# Patient Record
Sex: Female | Born: 1935 | Race: Asian | Hispanic: No | Smoking: Current every day smoker
Health system: Southern US, Community
[De-identification: ages and names within clinical notes are randomized; demographics above are authoritative.]

## PROBLEM LIST (undated history)

## (undated) DIAGNOSIS — R221 Localized swelling, mass and lump, neck: Secondary | ICD-10-CM

## (undated) DIAGNOSIS — D638 Anemia in other chronic diseases classified elsewhere: Secondary | ICD-10-CM

## (undated) DIAGNOSIS — R32 Unspecified urinary incontinence: Secondary | ICD-10-CM

## (undated) DIAGNOSIS — M810 Age-related osteoporosis without current pathological fracture: Secondary | ICD-10-CM

## (undated) DIAGNOSIS — K219 Gastro-esophageal reflux disease without esophagitis: Secondary | ICD-10-CM

## (undated) DIAGNOSIS — M4800 Spinal stenosis, site unspecified: Secondary | ICD-10-CM

## (undated) DIAGNOSIS — Z720 Tobacco use: Secondary | ICD-10-CM

## (undated) DIAGNOSIS — I10 Essential (primary) hypertension: Secondary | ICD-10-CM

## (undated) HISTORY — DX: Localized swelling, mass and lump, neck: R22.1

## (undated) HISTORY — DX: Essential (primary) hypertension: I10

## (undated) HISTORY — PX: APPENDECTOMY: SHX54

---

## 2000-08-26 ENCOUNTER — Other Ambulatory Visit: Admission: RE | Admit: 2000-08-26 | Discharge: 2000-08-26 | Payer: Self-pay | Admitting: *Deleted

## 2001-06-24 ENCOUNTER — Emergency Department (HOSPITAL_COMMUNITY): Admission: EM | Admit: 2001-06-24 | Discharge: 2001-06-24 | Payer: Self-pay | Admitting: Emergency Medicine

## 2001-07-01 ENCOUNTER — Encounter: Admission: RE | Admit: 2001-07-01 | Discharge: 2001-07-01 | Payer: Self-pay | Admitting: Internal Medicine

## 2003-09-28 ENCOUNTER — Encounter (INDEPENDENT_AMBULATORY_CARE_PROVIDER_SITE_OTHER): Payer: Self-pay | Admitting: *Deleted

## 2003-09-28 ENCOUNTER — Ambulatory Visit (HOSPITAL_COMMUNITY): Admission: RE | Admit: 2003-09-28 | Discharge: 2003-09-28 | Payer: Self-pay | Admitting: Gastroenterology

## 2007-03-19 ENCOUNTER — Encounter: Admission: RE | Admit: 2007-03-19 | Discharge: 2007-03-19 | Payer: Self-pay | Admitting: Family Medicine

## 2009-02-10 ENCOUNTER — Encounter: Admission: RE | Admit: 2009-02-10 | Discharge: 2009-02-10 | Payer: Self-pay | Admitting: *Deleted

## 2010-11-11 ENCOUNTER — Encounter: Payer: Self-pay | Admitting: *Deleted

## 2011-03-08 NOTE — Op Note (Signed)
Barbara Dennis, Barbara Dennis                         ACCOUNT NO.:  [id number]   MEDICAL RECORD NO.:  [id number]                   PATIENT TYPE:  AMB   LOCATION:  ENDO                                 FACILITY:  [facility]   PHYSICIAN:  Anselmo Rod, M.D.               DATE OF BIRTH:  04-Dec-1933   DATE OF PROCEDURE:  09/28/2003  DATE OF DISCHARGE:                                 OPERATIVE REPORT   PROCEDURE:  Colonoscopy with biopsies.   ENDOSCOPIST:  Anselmo Rod, M.D.   INSTRUMENT USED:  Olympus video colonoscope.   INDICATIONS FOR PROCEDURE:  A 77 year old Asian female with a history of  right lower quadrant pain, normal CT of the pelvis, undergoing screening  colonoscopy to rule out colonic polyps, masses, etc.   PREPROCEDURE PREPARATION:  Informed consent was procured from the patient.  The patient was fasted for eight hours prior to the procedure and prepped  with a bottle of magnesium citrate and a gallon of GoLYTELY the night prior  to the procedure.   PREPROCEDURE PHYSICAL EXAMINATION:  VITAL SIGNS:  Stable.  NECK:  Supple.  CHEST:  Clear to auscultation.  S1 and S2 regular.  ABDOMEN:  Soft with normal bowel sounds.   DESCRIPTION OF PROCEDURE:  The patient was placed in the left lateral  decubitus position and sedated with 50 mg of Demerol and 5 mg of Versed  intravenously in slow incremental doses.  Once the patient was adequately  sedated and maintained on low flow oxygen and continuous cardiac monitoring,  the Olympus video colonoscope was advanced from the rectum to the cecum.  There was a large amount of residual stool in the cecum and right colon.  Multiple washings were done.  The patient's position was changed from the  left lateral supine in the right lateral position with gentle application of  abdominal pressure to reach the cecal base.  A small polyp was biopsied at  40 cm.  Small lesions could have been missed especially in the cecum and  right colon.  No large  masses or polyps were seen.  There was no evidence of  diverticulosis.  Retroflexion in the rectum revealed no abnormalities.   IMPRESSION:  1. Essentially unrevealing colonoscopy except for small polyps biopsied at     40 cm.  2. Large amount of residual stool in the right colon.  Small lesions could     have been missed.   RECOMMENDATIONS:  Await pathology results.  Continue a high fiber diet with  liberal fluid intake.  Outpatient follow-up in the next four weeks or  earlier if needed.                                               Anselmo Rod, M.D.  JNM/MEDQ  D:  09/29/2003  T:  09/30/2003  Job:  [id number]   cc:   Gabriel Earing, M.D.  [address]  [place]  [place] [address]  Fax: [phone]

## 2012-07-18 ENCOUNTER — Ambulatory Visit (INDEPENDENT_AMBULATORY_CARE_PROVIDER_SITE_OTHER): Payer: Medicare Other | Admitting: Family Medicine

## 2012-07-18 VITALS — BP 124/74 | HR 80 | Temp 98.2°F | Resp 17 | Ht 58.5 in | Wt 105.0 lb

## 2012-07-18 DIAGNOSIS — N760 Acute vaginitis: Secondary | ICD-10-CM

## 2012-07-18 DIAGNOSIS — R509 Fever, unspecified: Secondary | ICD-10-CM

## 2012-07-18 DIAGNOSIS — R300 Dysuria: Secondary | ICD-10-CM

## 2012-07-18 DIAGNOSIS — N390 Urinary tract infection, site not specified: Secondary | ICD-10-CM

## 2012-07-18 DIAGNOSIS — D72829 Elevated white blood cell count, unspecified: Secondary | ICD-10-CM

## 2012-07-18 LAB — POCT UA - MICROSCOPIC ONLY
Bacteria, U Microscopic: UNDETERMINED
Casts, Ur, LPF, POC: UNDETERMINED
Crystals, Ur, HPF, POC: UNDETERMINED
Epithelial cells, urine per micros: UNDETERMINED
Mucus, UA: UNDETERMINED
Yeast, UA: UNDETERMINED

## 2012-07-18 LAB — POCT URINALYSIS DIPSTICK
Bilirubin, UA: NEGATIVE
Glucose, UA: NEGATIVE
Ketones, UA: NEGATIVE
Nitrite, UA: NEGATIVE
Protein, UA: NEGATIVE
Spec Grav, UA: 1.015
Urobilinogen, UA: 0.2
pH, UA: 7

## 2012-07-18 LAB — POCT CBC
Granulocyte percent: 71.2 % (ref 37–80)
HCT, POC: 40.4 % (ref 37.7–47.9)
Hemoglobin: 13 g/dL (ref 12.2–16.2)
Lymph, poc: 3 (ref 0.6–3.4)
MCH, POC: 31 pg (ref 27–31.2)
MCHC: 32.2 g/dL (ref 31.8–35.4)
MCV: 96.2 fL (ref 80–97)
MID (cbc): 0.7 (ref 0–0.9)
MPV: 7.6 fL (ref 0–99.8)
POC Granulocyte: 9.3 — AB (ref 2–6.9)
POC LYMPH PERCENT: 23.3 % (ref 10–50)
POC MID %: 5.5 %M (ref 0–12)
Platelet Count, POC: 310 10*3/uL (ref 142–424)
RBC: 4.2 M/uL (ref 4.04–5.48)
RDW, POC: 13.7 %
WBC: 13 10*3/uL — AB (ref 4.6–10.2)

## 2012-07-18 MED ORDER — CEFTRIAXONE SODIUM 1 G IJ SOLR: 1.0000 g | INTRAMUSCULAR | Status: DC

## 2012-07-18 MED ORDER — CEFTRIAXONE SODIUM 1 G IJ SOLR
1.0000 g | Freq: Once | INTRAMUSCULAR | Status: AC
  Administered 2012-07-18: 1 g via INTRAMUSCULAR

## 2012-07-18 MED ORDER — NITROFURANTOIN MONOHYD MACRO 100 MG PO CAPS: 100.0000 mg | ORAL_CAPSULE | Freq: Two times a day (BID) | ORAL | Status: DC

## 2012-07-18 NOTE — Progress Notes (Signed)
[facility]:  Office Visit  Chief Complaint:  Chief Complaint  Patient presents with  . Urinary Frequency  . Dysuria    burn during urination    HPI: Barbara Dennis is a 78 y.o. female who complains of dysuria and fever. Hard to determine how long. She is [place] and their is a language barrier.  + Fever, chills, back pain; denies abd or pelvic pain.   History reviewed. No pertinent past medical history. Past Surgical History  Procedure Date  . Appendectomy    History   Social History  . Marital Status: Widowed    Spouse Name: N/A    Number of Children: N/A  . Years of Education: N/A   Social History Main Topics  . Smoking status: Current Every Day Smoker -- 0.7 packs/day for 60 years    Types: Cigarettes  . Smokeless tobacco: None  . Alcohol Use: No  . Drug Use: No  . Sexually Active: None   Other Topics Concern  . None   Social History Narrative  . None   No family history on file. No Known Allergies Prior to Admission medications   Medication Sig Start Date End Date Taking? Authorizing Provider  Multiple Vitamins-Minerals (MULTIVITAMIN WITH MINERALS) tablet Take 1 tablet by mouth daily.   Yes Historical Provider, MD     ROS: The patient denies fevers, chills, night sweats, unintentional weight loss, chest pain, palpitations, wheezing, dyspnea on exertion, nausea, vomiting, abdominal pain, dysuria, hematuria, melena, numbness, weakness, or tingling.   All other systems have been reviewed and were otherwise negative with the exception of those mentioned in the HPI and as above.    PHYSICAL EXAM: Filed Vitals:   07/18/12 1101  BP: 124/74  Pulse: 80  Temp: 98.2 F (36.8 C)  Resp: 17   Filed Vitals:   07/18/12 1101  Height: 4' 10.5" (1.486 m)  Weight: 105 lb (47.628 kg)   Body mass index is 21.57 kg/(m^2).  General: Alert, no acute distress HEENT:  Normocephalic, atraumatic, oropharynx patent.  Cardiovascular:  Regular rate and  rhythm, no rubs murmurs or gallops.  No Carotid bruits, radial pulse intact. No pedal edema.  Respiratory: Clear to auscultation bilaterally.  No wheezes, rales, or rhonchi.  No cyanosis, no use of accessory musculature GI: No organomegaly, abdomen is soft and non-tender, positive bowel sounds.  No masses. Skin: No rashes. Neurologic: Facial musculature symmetric. Psychiatric: Patient is appropriate throughout our interaction. Lymphatic: No cervical lymphadenopathy Musculoskeletal: Gait intact.   LABS: Results for orders placed in visit on 07/18/12  POCT UA - MICROSCOPIC ONLY      Component Value Range   WBC, Ur, HPF, POC TNTC     RBC, urine, microscopic TNTC     Bacteria, U Microscopic unable to view     Mucus, UA unable to view     Epithelial cells, urine per micros unable to view     Crystals, Ur, HPF, POC unable to view     Casts, Ur, LPF, POC unable to view     Yeast, UA unable to view    POCT URINALYSIS DIPSTICK      Component Value Range   Color, UA yellow     Clarity, UA turbid     Glucose, UA negative     Bilirubin, UA negative     Ketones, UA negative     Spec Grav, UA 1.015     Blood, UA TNTC     pH, UA  7.0     Protein, UA negative     Urobilinogen, UA 0.2     Nitrite, UA negative     Leukocytes, UA large (3+)    POCT CBC      Component Value Range   WBC 13.0 (*) 4.6 - 10.2 K/uL   Lymph, poc 3.0  0.6 - 3.4   POC LYMPH PERCENT 23.3  10 - 50 %L   MID (cbc) 0.7  0 - 0.9   POC MID % 5.5  0 - 12 %M   POC Granulocyte 9.3 (*) 2 - 6.9   Granulocyte percent 71.2  37 - 80 %G   RBC 4.20  4.04 - 5.48 M/uL   Hemoglobin 13.0  12.2 - 16.2 g/dL   HCT, POC 16.1  09.6 - 47.9 %   MCV 96.2  80 - 97 fL   MCH, POC 31.0  27 - 31.2 pg   MCHC 32.2  31.8 - 35.4 g/dL   RDW, POC 04.5     Platelet Count, POC 310  142 - 424 K/uL   MPV 7.6  0 - 99.8 fL     EKG/XRAY:   Primary read interpreted by Dr[facility] Conley Rolls at [facility].   ASSESSMENT/PLAN: Encounter Diagnoses  Name Primary?  .  Dysuria Yes  . Vaginitis   . Fever   . UTI (lower urinary tract infection)   . Leukocytosis    Rx Rocephin 1 gram x 1 Rx Macrobid x 7 days   Call to make appt with Dr. Audria Nine to recheck urine and also establish primary care for Oct 7th    Hamilton [place], DO 07/18/2012 12:34 PM

## 2012-07-20 LAB — URINE CULTURE: Colony Count: >=100000

## 2012-08-21 ENCOUNTER — Ambulatory Visit (INDEPENDENT_AMBULATORY_CARE_PROVIDER_SITE_OTHER): Payer: Medicare Other | Admitting: Family Medicine

## 2012-08-21 VITALS — BP 140/84 | HR 84 | Temp 98.7°F | Resp 16 | Ht 60.0 in | Wt 106.2 lb

## 2012-08-21 DIAGNOSIS — R300 Dysuria: Secondary | ICD-10-CM

## 2012-08-21 DIAGNOSIS — Z124 Encounter for screening for malignant neoplasm of cervix: Secondary | ICD-10-CM

## 2012-08-21 DIAGNOSIS — N952 Postmenopausal atrophic vaginitis: Secondary | ICD-10-CM

## 2012-08-21 DIAGNOSIS — R1084 Generalized abdominal pain: Secondary | ICD-10-CM

## 2012-08-21 DIAGNOSIS — R9389 Abnormal findings on diagnostic imaging of other specified body structures: Secondary | ICD-10-CM

## 2012-08-21 DIAGNOSIS — Z01419 Encounter for gynecological examination (general) (routine) without abnormal findings: Secondary | ICD-10-CM

## 2012-08-21 DIAGNOSIS — Z780 Asymptomatic menopausal state: Secondary | ICD-10-CM

## 2012-08-21 LAB — POCT URINALYSIS DIPSTICK
Bilirubin, UA: NEGATIVE
Glucose, UA: NEGATIVE
Spec Grav, UA: 1.025
pH, UA: 5.5

## 2012-08-21 LAB — COMPREHENSIVE METABOLIC PANEL
ALT: 12 U/L (ref 0–35)
AST: 17 U/L (ref 0–37)
Alkaline Phosphatase: 101 U/L (ref 39–117)
Creat: 0.84 mg/dL (ref 0.50–1.10)
Total Bilirubin: 0.7 mg/dL (ref 0.3–1.2)

## 2012-08-21 LAB — IFOBT (OCCULT BLOOD): IFOBT: NEGATIVE

## 2012-08-21 LAB — TSH: TSH: 1.288 u[IU]/mL (ref 0.350–4.500)

## 2012-08-21 MED ORDER — ESTROGENS, CONJUGATED 0.625 MG/GM VA CREA: TOPICAL_CREAM | VAGINAL | Status: DC

## 2012-08-21 NOTE — Patient Instructions (Signed)
Please try to get your medical records from your previous medical practice/provider so that I can review your medical history. If you have any records of vaccines or immunizations, please bring that to your next visit.  Atrophic Vaginitis Atrophic vaginitis is a problem of low levels of estrogen in women. This problem can happen at any age. It is most common in women who have gone through menopause ("the change").  HOW WILL I KNOW IF I HAVE THIS PROBLEM? You may have:  Trouble with peeing (urinating), such as:  Going to the bathroom often.  A hard time holding your pee until you reach a bathroom.  Leaking pee.  Having pain when you pee.  Itching or a burning feeling.  Vaginal bleeding and spotting.  Pain during sex.  Dryness of the vagina.  A yellow, bad-smelling fluid (discharge) coming from the vagina. HOW WILL MY DOCTOR CHECK FOR THIS PROBLEM?  During your exam, your doctor will likely find the problem.  If there is a vaginal fluid, it may be checked for infection. HOW WILL THIS PROBLEM BE TREATED? Keep the vulvar skin as clean as possible. Moisturizers and lubricants can help with some of the symptoms. Estrogen replacement can help. There are 2 ways to take estrogen:  Systemic estrogen gets estrogen to your whole body. It takes many weeks or months before the symptoms get better.  You take an estrogen pill.  You use a skin patch. This is a patch that you put on your skin.  If you still have your uterus, your doctor may ask you to take a hormone. Talk to your doctor about the right medicine for you.  Estrogen cream.  This puts estrogen only at the part of your body where you apply it. The cream is put into the vagina or put on the vulvar skin. For some women, estrogen cream works faster than pills or the patch. CAN ALL WOMEN WITH THIS PROBLEM USE ESTROGEN? No. Women with certain types of cancer, liver problems, or problems with blood clots should not take estrogen.  Your doctor can help you decide the best treatment for your symptoms. Document Released: 03/25/2008 Document Revised: 12/30/2011 Document Reviewed: 03/25/2008 [facility] Patient Information 2013 [place], [place].

## 2012-08-25 ENCOUNTER — Encounter: Payer: Self-pay | Admitting: Family Medicine

## 2012-08-25 NOTE — Progress Notes (Signed)
Subjective:    Patient ID: Barbara Dennis, female    DOB: [birth year], 78 y.o.   MRN: [id number]  HPI  This 78 y.o. [place] female comes in today accompanied by a friend who is her interpreter;  the pt was seen at [facility] and treated for UTI. Pt had no problem with prescribed medication  but dysuria persists. She c/o dryness and has used an OTC topical agent with no improvement.  No recent PAP/ pelvic to report. She is a vague historian so details are lacking. She has vague  pelvic discomfort; she is postmenopausal. (A pelvic ultrasound was performed in April 2010 and   showed minimal abnormality). In general, she feels good and has a good appetite and  energy level is appropriate for 65 y.o. Woman.   Efforts at getting a history re: Immunizations or other preventative care yield little information.    Review of Systems  Constitutional: Negative for fever, chills, activity change, appetite change, fatigue and unexpected weight change.  HENT: Negative.   Eyes: Negative.   Respiratory: Negative for cough, chest tightness and shortness of breath.   Cardiovascular: Negative.   Gastrointestinal: Negative for nausea, vomiting, abdominal pain, diarrhea, constipation, blood in stool and abdominal distention.  Genitourinary: Positive for dysuria, frequency and pelvic pain. Negative for hematuria, flank pain, decreased urine volume, vaginal bleeding, enuresis, difficulty urinating and vaginal pain.  Musculoskeletal: Negative.   Neurological: Negative.   Hematological: Negative.   Psychiatric/Behavioral: Negative.        Objective:   Physical Exam  Nursing note and vitals reviewed. Constitutional: She is oriented to person, place, and time. She appears well-developed and well-nourished. No distress.  HENT:  Head: Normocephalic and atraumatic.  Right Ear: External ear normal.  Left Ear: External ear normal.  Nose: Nose normal.  Mouth/Throat: Oropharynx is clear and moist.  Eyes:  Conjunctivae normal and EOM are normal. No scleral icterus.  Neck: Normal range of motion. Neck supple. No thyromegaly present.  Cardiovascular: Normal rate, regular rhythm and normal heart sounds.  Exam reveals no gallop and no friction rub.   No murmur heard. Pulmonary/Chest: Effort normal and breath sounds normal. No respiratory distress.  Abdominal: Soft. Normal appearance. She exhibits no distension, no abdominal bruit, no pulsatile midline mass and no mass. Bowel sounds are decreased. There is no hepatosplenomegaly. There is tenderness in the suprapubic area. There is no rigidity, no guarding and no CVA tenderness.  Genitourinary: Rectum normal and uterus normal. Rectal exam shows no external hemorrhoid, no internal hemorrhoid, no mass, no tenderness and anal tone normal. Guaiac negative stool. No breast swelling, tenderness, discharge or bleeding. There is no rash, tenderness or lesion on the right labia. There is no rash, tenderness or lesion on the left labia. Cervix exhibits friability. Cervix exhibits no motion tenderness and no discharge. Right adnexum displays no mass, no tenderness and no fullness. Left adnexum displays no mass, no tenderness and no fullness. There is erythema around the vagina. No tenderness or bleeding around the vagina. No signs of injury around the vagina. No vaginal discharge found.       Vaginal mucosa dry and atrophic.  Musculoskeletal: Normal range of motion. She exhibits no edema and no tenderness.  Lymphadenopathy:    She has no cervical adenopathy.       Right: No inguinal adenopathy present.       Left: No inguinal adenopathy present.  Neurological: She is alert and oriented to person, place, and time. She has normal reflexes.  No cranial nerve deficit. She exhibits normal muscle tone. Coordination normal.  Skin: Skin is warm and dry. No erythema. No pallor.  Psychiatric: She has a normal mood and affect. Her behavior is normal.   Poct UA: negative except  for 1+ Leukocytes      Assessment & Plan:   1. Dysuria    2. Abnormal pelvic ultrasound in April 2010 Pap IG w/ reflex to HPV when ASC-U, IFOBT POC (occult bld, rslt in office)  3. Encounter for cervical Pap smear with pelvic exam  Pap IG w/ reflex to HPV when ASC-U  4. Abdominal discomfort, generalized  Comprehensive metabolic panel, TSH, T4, Free  5. Menopause  Print info given to the pt  6. Atrophic vaginitis  conjugated estrogens (PREMARIN) vaginal cream

## 2012-08-26 LAB — PAP IG W/ RFLX HPV ASCU

## 2012-08-26 NOTE — Progress Notes (Signed)
Quick Note:  Please call pt and advise that the following labs are normal...   All labs and PAP are normal.  She has a follow-up appt and I can review these results at that visit; there is a significant language barrier.  ______

## 2012-09-22 ENCOUNTER — Ambulatory Visit (INDEPENDENT_AMBULATORY_CARE_PROVIDER_SITE_OTHER): Payer: Medicare Other | Admitting: Family Medicine

## 2012-09-22 ENCOUNTER — Encounter: Payer: Self-pay | Admitting: Family Medicine

## 2012-09-22 VITALS — BP 162/64 | HR 88 | Temp 99.1°F | Resp 16 | Ht <= 58 in | Wt 105.4 lb

## 2012-09-22 DIAGNOSIS — G4700 Insomnia, unspecified: Secondary | ICD-10-CM

## 2012-09-22 DIAGNOSIS — Z720 Tobacco use: Secondary | ICD-10-CM

## 2012-09-22 DIAGNOSIS — F17200 Nicotine dependence, unspecified, uncomplicated: Secondary | ICD-10-CM

## 2012-09-22 DIAGNOSIS — R030 Elevated blood-pressure reading, without diagnosis of hypertension: Secondary | ICD-10-CM

## 2012-09-22 NOTE — Patient Instructions (Addendum)
For sleep: try MELATONIN which you can get without a prescription to help you sleep better.  You will also try to stop smoking before your next birthday in March. I will see you back in March 2014.   Smoking Cessation Quitting smoking is important to your health and has many advantages. However, it is not always easy to quit since nicotine is a very addictive drug. Often times, people try 3 times or more before being able to quit. This document explains the best ways for you to prepare to quit smoking. Quitting takes hard work and a lot of effort, but you can do it. ADVANTAGES OF QUITTING SMOKING  You will live longer, feel better, and live better.  Your body will feel the impact of quitting smoking almost immediately.  Within 20 minutes, blood pressure decreases. Your pulse returns to its normal level.  After 8 hours, carbon monoxide levels in the blood return to normal. Your oxygen level increases.  After 24 hours, the chance of having a heart attack starts to decrease. Your breath, hair, and body stop smelling like smoke.  After 48 hours, damaged nerve endings begin to recover. Your sense of taste and smell improve.  After 72 hours, the body is virtually free of nicotine. Your bronchial tubes relax and breathing becomes easier.  After 2 to 12 weeks, lungs can hold more air. Exercise becomes easier and circulation improves.  The risk of having a heart attack, stroke, cancer, or lung disease is greatly reduced.  After 1 year, the risk of coronary heart disease is cut in half.  After 5 years, the risk of stroke falls to the same as a nonsmoker.  After 10 years, the risk of lung cancer is cut in half and the risk of other cancers decreases significantly.  After 15 years, the risk of coronary heart disease drops, usually to the level of a nonsmoker.  If you are pregnant, quitting smoking will improve your chances of having a healthy baby.  The people you live with, especially any  children, will be healthier.  You will have extra money to spend on things other than cigarettes. QUESTIONS TO THINK ABOUT BEFORE ATTEMPTING TO QUIT You may want to talk about your answers with your caregiver.  Why do you want to quit?  If you tried to quit in the past, what helped and what did not?  What will be the most difficult situations for you after you quit? How will you plan to handle them?  Who can help you through the tough times? Your family? Friends? A caregiver?  What pleasures do you get from smoking? What ways can you still get pleasure if you quit? Here are some questions to ask your caregiver:  How can you help me to be successful at quitting?  What medicine do you think would be best for me and how should I take it?  What should I do if I need more help?  What is smoking withdrawal like? How can I get information on withdrawal? GET READY  Set a quit date.  Change your environment by getting rid of all cigarettes, ashtrays, matches, and lighters in your home, car, or work. Do not let people smoke in your home.  Review your past attempts to quit. Think about what worked and what did not. GET SUPPORT AND ENCOURAGEMENT You have a better chance of being successful if you have help. You can get support in many ways.  Tell your family, friends, and co-workers that  you are going to quit and need their support. Ask them not to smoke around you.  Get individual, group, or telephone counseling and support. Programs are available at [employer] and health centers. Call your local health department for information about programs in your area.  Spiritual beliefs and practices may help some smokers quit.  Download a "quit meter" on your computer to keep track of quit statistics, such as how long you have gone without smoking, cigarettes not smoked, and money saved.  Get a self-help book about quitting smoking and staying off of tobacco. LEARN NEW SKILLS AND  BEHAVIORS  Distract yourself from urges to smoke. Talk to someone, go for a walk, or occupy your time with a task.  Change your normal routine. Take a different route to work. Drink tea instead of coffee. Eat breakfast in a different place.  Reduce your stress. Take a hot bath, exercise, or read a book.  Plan something enjoyable to do every day. Reward yourself for not smoking.  Explore interactive web-based programs that specialize in helping you quit. GET MEDICINE AND USE IT CORRECTLY Medicines can help you stop smoking and decrease the urge to smoke. Combining medicine with the above behavioral methods and support can greatly increase your chances of successfully quitting smoking.  Nicotine replacement therapy helps deliver nicotine to your body without the negative effects and risks of smoking. Nicotine replacement therapy includes nicotine gum, lozenges, inhalers, nasal sprays, and skin patches. Some may be available over-the-counter and others require a prescription.  Antidepressant medicine helps people abstain from smoking, but how this works is unknown. This medicine is available by prescription.  Nicotinic receptor partial agonist medicine simulates the effect of nicotine in your brain. This medicine is available by prescription. Ask your caregiver for advice about which medicines to use and how to use them based on your health history. Your caregiver will tell you what side effects to look out for if you choose to be on a medicine or therapy. Carefully read the information on the package. Do not use any other product containing nicotine while using a nicotine replacement product.  RELAPSE OR DIFFICULT SITUATIONS Most relapses occur within the first 3 months after quitting. Do not be discouraged if you start smoking again. Remember, most people try several times before finally quitting. You may have symptoms of withdrawal because your body is used to nicotine. You may crave cigarettes,  be irritable, feel very hungry, cough often, get headaches, or have difficulty concentrating. The withdrawal symptoms are only temporary. They are strongest when you first quit, but they will go away within 10 14 days. To reduce the chances of relapse, try to:  Avoid drinking alcohol. Drinking lowers your chances of successfully quitting.  Reduce the amount of caffeine you consume. Once you quit smoking, the amount of caffeine in your body increases and can give you symptoms, such as a rapid heartbeat, sweating, and anxiety.  Avoid smokers because they can make you want to smoke.  Do not let weight gain distract you. Many smokers will gain weight when they quit, usually less than 10 pounds. Eat a healthy diet and stay active. You can always lose the weight gained after you quit.  Find ways to improve your mood other than smoking. FOR MORE INFORMATION  www.smokefree.gov  Document Released: 10/01/2001 Document Revised: 04/07/2012 Document Reviewed: 01/16/2012 [facility] Patient Information 2013 [place], [place].

## 2012-09-22 NOTE — Progress Notes (Signed)
S:  This pt returns for follow-up. Vaginal dryness and dysuria have resolved w/ use of vaginal cream.Today she c/o insomnia; she tosses and turns all night. She took medication in the past but none taken recently. She denies worry about anything. The medication she tried in the past caused very vivid dreams so she discontinued that medication. Pt lives alone but knows some of her neighbors (but not well). It is a quiet apartment building. Sleeps on a sofa and awakens "sore". She watches TV to fall asleep. She does not nap during the day. She states that growing up, she slept on the floor.  Re: elevated BP- she is trying to quit smoking; down to 5-7 cigarettes a day. She denies CP or tightness, palpitations, SOB or cough, HA, dizziness, weakness or syncope.  ROS: As per HPI.  O:  Filed Vitals:   09/22/12 1331 09/22/12 1401  BP: 164/76 162/64  Pulse: 88   Temp: 99.1 F (37.3 C)   TempSrc: Oral   Resp: 16   Height: 4\' 10"  (1.473 m)   Weight: 105 lb 6.4 oz (47.809 kg)   SpO2: 97%     GEN: In NAD; WN/WD.  Moderate language barrier. HEENT: [place]/AT; EOMI w/ clear conj/scl. Otherwise normal except fair dentition. COR: RRR. No edema. LUNGS: Normal resp rate and effort. NEURO: A&O x 3; CNs intact. Nonfocal.  A/P:  1. Insomnia    Pt advised to try OTC Melatonin 1 tab or capsule 30 minutes before bedtime; she also must turn off TV 30 -60 minutes before desired sleep time.  2. Tobacco user    Cessation encouraged. Plan to be a nonsmoker by next visit.  3. Elevated blood pressure reading without diagnosis of hypertension   BP has been normal at past visits; monitor.

## 2012-09-23 DIAGNOSIS — Z720 Tobacco use: Secondary | ICD-10-CM | POA: Insufficient documentation

## 2012-12-25 ENCOUNTER — Ambulatory Visit: Payer: Medicare Other | Admitting: Family Medicine

## 2013-02-16 ENCOUNTER — Ambulatory Visit (INDEPENDENT_AMBULATORY_CARE_PROVIDER_SITE_OTHER): Payer: Medicare Other | Admitting: Family Medicine

## 2013-02-16 ENCOUNTER — Encounter: Payer: Self-pay | Admitting: Family Medicine

## 2013-02-16 VITALS — BP 146/68 | HR 81 | Temp 98.1°F | Resp 16 | Ht <= 58 in | Wt 106.6 lb

## 2013-02-16 DIAGNOSIS — M25559 Pain in unspecified hip: Secondary | ICD-10-CM

## 2013-02-16 DIAGNOSIS — M79641 Pain in right hand: Secondary | ICD-10-CM

## 2013-02-16 DIAGNOSIS — M79609 Pain in unspecified limb: Secondary | ICD-10-CM

## 2013-02-16 DIAGNOSIS — M1990 Unspecified osteoarthritis, unspecified site: Secondary | ICD-10-CM | POA: Insufficient documentation

## 2013-02-16 LAB — CBC WITH DIFFERENTIAL/PLATELET
Eosinophils Relative: 4 % (ref 0–5)
HCT: 35.6 % — ABNORMAL LOW (ref 36.0–46.0)
Hemoglobin: 12.2 g/dL (ref 12.0–15.0)
Lymphocytes Relative: 31 % (ref 12–46)
Lymphs Abs: 2.6 10*3/uL (ref 0.7–4.0)
MCV: 90.8 fL (ref 78.0–100.0)
Monocytes Relative: 7 % (ref 3–12)
Platelets: 307 10*3/uL (ref 150–400)
RBC: 3.92 MIL/uL (ref 3.87–5.11)
WBC: 8.6 10*3/uL (ref 4.0–10.5)

## 2013-02-16 MED ORDER — DICLOFENAC SODIUM 75 MG PO TBEC: DELAYED_RELEASE_TABLET | ORAL | Status: DC

## 2013-02-16 NOTE — Patient Instructions (Signed)
For joint pain- do not take Aleve or aspirin while you are taking the medication that I prescribed.  Diclofenac 75 mg tablets should be taken twice a day with food or snack.

## 2013-02-16 NOTE — Progress Notes (Signed)
S:  This 79 y.o. [place] woman c/o bilateral posterior hip pain and hand pain (R>L). Pt lives alone and does stays busy doing house work; she also walks almost daily. Pt is R-hand dominant and has swelling in the digits of that hand. She takes Aleve and ASA 325 mg which help reduce discomfort. She has not been febrile or diaphoretic. She has no rashes, CP or tightness, SOB or DOE, abd pain or change in stools, UTI symptoms, weakness or significant gait disturbance. Pt does have stiffness in mornings and with prolonged sitting.  ROS: AS per HPI. Otherwise noncontributory.  O: Filed Vitals:   02/16/13 1551  BP: 146/68  Pulse: 81  Temp: 98.1 F (36.7 C)  Resp: 16   GEN: In NAD; WN,WD. HENT: [place]/AT; EOMI w/ clear conj/ sclerae. Oroph clear and moist; dentition is fair. COR: RRR. No edema. LUNGS: Normal resp rate and effort.  BACK: Spine is straight; no tender points over spinous processes or major muscles groups. Tender bilateral SI joints. Forward flexion to 90 degrees. MS: R hand- tender, erythematous swollen DIP and PIP joints especially 2nd, 3rd and 4th digits. Grip is good. No muscle atrophy noted. SKIN: W&D; no rashes or diffuse erythema or pallor. NEURO: A&O x 3; CNs intact. Nonfocal. Gait is antalgic.  A/P: Pain in joint, pelvic region and thigh, unspecified laterality -  RX: Diclofenac 75 mg 1 tablet twice a day w/ meals. Do not take Aleve or ASA 325 mg tablets while taking this prescription medication. Plan: Sedimentation rate, CBC with Differential, ANA, Hepatitis C antibody, Rheumatoid factor  Bilateral hand pain - Plan: Sedimentation rate, CBC with Differential, ANA, Hepatitis C antibody, Rheumatoid factor

## 2013-02-17 LAB — RHEUMATOID FACTOR: Rhuematoid fact SerPl-aCnc: <10 IU/mL (ref <=14)

## 2013-02-18 NOTE — Progress Notes (Signed)
Quick Note:  Please contact pt and advise that the following labs are abnormal...  All labs look normal; the marker for inflammation is above normal but not by much. It is consistent with joint pain and arthritis. Follow-up as scheduled next month.  Copy to pt. ______

## 2013-03-26 ENCOUNTER — Ambulatory Visit (INDEPENDENT_AMBULATORY_CARE_PROVIDER_SITE_OTHER): Payer: Medicare Other | Admitting: Family Medicine

## 2013-03-26 ENCOUNTER — Encounter: Payer: Self-pay | Admitting: Family Medicine

## 2013-03-26 VITALS — BP 177/80 | HR 85 | Temp 97.9°F | Resp 18 | Ht <= 58 in | Wt 106.0 lb

## 2013-03-26 DIAGNOSIS — F411 Generalized anxiety disorder: Secondary | ICD-10-CM

## 2013-03-26 DIAGNOSIS — F17200 Nicotine dependence, unspecified, uncomplicated: Secondary | ICD-10-CM

## 2013-03-26 DIAGNOSIS — R030 Elevated blood-pressure reading, without diagnosis of hypertension: Secondary | ICD-10-CM

## 2013-03-26 MED ORDER — BUPROPION HCL 75 MG PO TABS: 75.0000 mg | ORAL_TABLET | Freq: Two times a day (BID) | ORAL | Status: DC

## 2013-03-26 NOTE — Progress Notes (Signed)
S:  This 79 y.o. [place] female is here to discuss tobacco use and elevated BP. She does not have a diagnosis HTN but often has elevated BP when she feels anxious. Her concerns are for her future and what will happen to her when she can no longer care for self. She has friends but no family in the area; her family is in [place]. She does not want to return to [place] in her later years but would like to go to a "place for old people". When she gets anxious about the future is when she is most likely to smoke. She would really like to quit and is willing to try some oral medication; the patches are not effective. At this time, she has no significant health consequences as a result of tobacco use.  Patient Active Problem List   Diagnosis Date Noted  . DJD (degenerative joint disease) 02/16/2013  . Tobacco user 09/23/2012  . Menopause 08/21/2012  . Postmenopausal atrophic vaginitis 08/21/2012    PMHx, Soc HX and Fam Hx reviewed.  ROS: Negative for abnormal weight loss, fatigue, diaphoresis, mouth lesions, sore throat, difficulty swallowing, CP or tightness, palpitations, SOB or COE, cough, hemoptysis, GI problems, skin changes in extremities, muscles cramping or claudication, HA, dizziness, numbness or syncope. She has mild sleep disturbance but no agitation, dysphoric mood or behavior changes.  O:  Filed Vitals:   03/26/13 1128  BP: 177/80                                    Weight is stable.  Pulse: 85  Temp: 97.9 F (36.6 C)  Resp: 18   GEN: In NAD; WN,WD. HENT: [place]/AT; EOMI w/ clear conj/sclerae. EACs/nose normal. Dentition is fair. NECK: No LAN or TMG. COR: RRR. No m/g/r. LUNGS: CTA; No wheezes or rhonchi. Normal resp rate and effort. SKIN: W&D; no erythema, rashes or pallor. MS: MAEs; no c/c/e. No deformities. NEURO: A&O x 3; CNs intact. Nonfocal. PSYCH: Mildly anxious but otherwise pleasant demeanor. Attentive and conversant. Speech pattern and thought content are normal. Judgement is  sound.  A/P: Elevated blood-pressure reading without diagnosis of hypertension- continue to monitor; encouraged tobacco cessation.  Tobacco use disorder- trial of Bupropion at low dose to curb desire to smoke; pt has made decision to quit which demonstrates motivation. Take medication 1 tablet every AM until follow-up; tobacco quit date is April 20, 2013. Pt understands and agrees.  Anxiety state, unspecified- RX: Bupropion  Meds ordered this encounter  Medications  . buPROPion (WELLBUTRIN) 75 MG tablet    Sig: Take 1 tablet (75 mg total) by mouth 2 (two) times daily.    Dispense:  60 tablet    Refill:  5   Greater 50% of visit spent counseling pt and discussing treatment plan.

## 2013-05-07 ENCOUNTER — Encounter: Payer: Self-pay | Admitting: Family Medicine

## 2013-05-07 ENCOUNTER — Ambulatory Visit (INDEPENDENT_AMBULATORY_CARE_PROVIDER_SITE_OTHER): Payer: Medicare Other | Admitting: Family Medicine

## 2013-05-07 VITALS — BP 185/83 | HR 88 | Temp 97.8°F | Resp 16 | Ht 58.25 in | Wt 106.8 lb

## 2013-05-07 DIAGNOSIS — R52 Pain, unspecified: Secondary | ICD-10-CM

## 2013-05-07 DIAGNOSIS — R030 Elevated blood-pressure reading, without diagnosis of hypertension: Secondary | ICD-10-CM

## 2013-05-07 DIAGNOSIS — Z720 Tobacco use: Secondary | ICD-10-CM

## 2013-05-07 DIAGNOSIS — F17200 Nicotine dependence, unspecified, uncomplicated: Secondary | ICD-10-CM

## 2013-05-07 MED ORDER — DICLOFENAC SODIUM 75 MG PO TBEC: DELAYED_RELEASE_TABLET | ORAL | Status: DC

## 2013-05-07 NOTE — Patient Instructions (Addendum)
Your blood pressure is above normal.   I want you to stop drinking Mt. Dew sodas. Drink water and juice. You can have 1-2 cups of coffee in the mornings.  You need to stop smoking !!!  Take the medication -Bupropion twice a day (2 times a day). This is the medication to help you stop smoking and to help with your anxiety and mental distress.  I want you to come back in 1 month; bring all your medications with youo.

## 2013-05-07 NOTE — Progress Notes (Signed)
S: This 79 y.o. [place] female returns for follow-up re: elevated blood pressure. She has occasional mild frontal HA but no other symptoms. She drinks coffee every Am as well as Mt. Dew throughout the day. She has not restricted her salt intake. Pt has significant anxiety [id number] and aging; she wants to quit smoking and is taking Bupropion. Dose increase was explained at previous visit but pt continues to take only 1 tablet daily. She continues to smoke. Appetite is fair.  Pt c/o arthralgias, primarily in hands, low back and hips. This discomfort is worse in the morning upon arising but subsides as day progresses. Diclofenac 75 mg tablets were prescribed at previous visit but pt not taking this medication daily. She has not had fever/chills, fatigue, chest tightness, palpitations, SOB or DOE, rashes or bruising, HA, dizziness, numbness or weakness. Pt denies recent travel outside the state. She has had no contact w/ ill persons.  Patient Active Problem List   Diagnosis Date Noted  . DJD (degenerative joint disease) 02/16/2013  . Tobacco user 09/23/2012  . Menopause 08/21/2012  . Postmenopausal atrophic vaginitis 08/21/2012    PMHX, Soc Hx and Fam Hx reviewed.   ROS: As per HPI. Otherwise noncontributory.   O:  Filed Vitals:   05/07/13 1132  BP: 185/83                      BP recheck @ 12:15 pm= 160/80  Pulse: 88  Temp: 97.8 F (36.6 C)  Resp: 16   GEN: In NAD; WN,WD. HENT: [place]/AT; EOMI w/ clear conj/sclerae. Oroph moist; fair dentition. NECK: No LAN or TMG. COR: RRR. No edema. LUNGS: Normal resp rate and effort. SKIN: W&D; no rashes, ecchymoses or pallor. MS: MAEs; mild deg changes in hands/digits and wrists. Grip is good. Minimal muscle tenderness w/ palpation; no muscle atrophy. BACK: Paravertebral muscle tenderness. NEURO: A&O x 3; CNs intact. Gait is mildly antalgic but pt ambulates w/o assistive device. PSYCH: Mildly anxious; pt's comprehension is hampered by language  barrier.  A/P: Elevated blood pressure reading without diagnosis of hypertension- Advised dietary modifications (reduce caffeine and salt intake).  Tobacco user- Increase Bupropion dose to twice a day. This is reviewed w/ the pt multiple times to insure that she understands medication administration.  Body aches- DJD (pt advised to takes DIclofenac twice a day with meals as prescribed). She voices understanding.  Meds ordered this encounter  Medications  . diclofenac (VOLTAREN) 75 MG EC tablet    Sig: Take 1 tablet twice a day with food or snack.    Dispense:  60 tablet    Refill:  2

## 2013-06-17 ENCOUNTER — Encounter: Payer: Self-pay | Admitting: Family Medicine

## 2013-06-17 ENCOUNTER — Ambulatory Visit (INDEPENDENT_AMBULATORY_CARE_PROVIDER_SITE_OTHER): Payer: Medicare Other | Admitting: Family Medicine

## 2013-06-17 VITALS — BP 180/66 | HR 84 | Temp 97.7°F | Resp 16 | Ht <= 58 in | Wt 107.0 lb

## 2013-06-17 DIAGNOSIS — I10 Essential (primary) hypertension: Secondary | ICD-10-CM | POA: Insufficient documentation

## 2013-06-17 MED ORDER — LISINOPRIL 5 MG PO TABS: 5.0000 mg | ORAL_TABLET | Freq: Every day | ORAL | Status: DC

## 2013-06-17 NOTE — Patient Instructions (Addendum)
I have prescribed a medication to lower your blood pressure. It is called Lisinopril 5 mg. Take 1 tablet every morning or about the same time of day, every day.  I will see you again in 2 months; you should be off the cigarettes when you come back to see me.

## 2013-06-17 NOTE — Progress Notes (Signed)
S:  This 79 y.o. Asian female returns for BP recheck. She is attempting smoking cessation, currently less than 5 cigs daily. She c/o occasional HA but no dizziness, CP or tightness, palpitations, dizziness or syncope. She is compliant w/ current medications, mainly Bupropion twice daily; this was prescribed for treatment of anxiety and mild depression also. Again, pt states that she wants to go into a nursing home "maybe next year". She handles all ADLs at this time. She is concerned about living alone as she gets older.   PMHx, Soc Hx and Fam Hx reviewed.  ROS: AS per HPI.  O: Filed Vitals:   06/17/13 1301  BP: 180/66                                    BP recheck by me: 180/80  Pulse: 84  Temp: 97.7 F (36.5 C)  Resp: 16   GEN: In NAD. WN,WD. HENT: [place]/AT; EOMI w/ clear conj/ sclerae. Otherwise unremarkable. COR: RRR. No edema. LUNGS: Normal resp rate and effort. SKIN: W&D; no rashes or paloor. NEURO: A&O x 3; CNs intact. Nonfocal.  A/P: HTN, goal below 150/90- Continue smoking cessation and Bupropion as prescribed.  Meds ordered this encounter  Medications  . lisinopril (PRINIVIL,ZESTRIL) 5 MG tablet    Sig: Take 1 tablet (5 mg total) by mouth daily.    Dispense:  30 tablet    Refill:  3

## 2013-08-17 ENCOUNTER — Ambulatory Visit (INDEPENDENT_AMBULATORY_CARE_PROVIDER_SITE_OTHER): Payer: Medicare Other | Admitting: Family Medicine

## 2013-08-17 ENCOUNTER — Encounter: Payer: Self-pay | Admitting: Family Medicine

## 2013-08-17 VITALS — BP 162/77 | HR 87 | Temp 97.9°F | Resp 16 | Ht <= 58 in | Wt 104.0 lb

## 2013-08-17 DIAGNOSIS — Z720 Tobacco use: Secondary | ICD-10-CM

## 2013-08-17 DIAGNOSIS — F17200 Nicotine dependence, unspecified, uncomplicated: Secondary | ICD-10-CM

## 2013-08-17 DIAGNOSIS — R3919 Other difficulties with micturition: Secondary | ICD-10-CM

## 2013-08-17 DIAGNOSIS — N952 Postmenopausal atrophic vaginitis: Secondary | ICD-10-CM

## 2013-08-17 DIAGNOSIS — R05 Cough: Secondary | ICD-10-CM

## 2013-08-17 DIAGNOSIS — R39198 Other difficulties with micturition: Secondary | ICD-10-CM

## 2013-08-17 LAB — POCT URINALYSIS DIPSTICK
Bilirubin, UA: NEGATIVE
Nitrite, UA: NEGATIVE
Protein, UA: NEGATIVE
pH, UA: 7.5

## 2013-08-17 MED ORDER — ESTROGENS, CONJUGATED 0.625 MG/GM VA CREA: TOPICAL_CREAM | VAGINAL | Status: DC

## 2013-08-17 NOTE — Patient Instructions (Signed)
Your cough is due to smoking; we have talked about how to quit smoking. You have been able to cut back a lot but it would be great if you would quit. You have samples of DELSYM cough syrup- you can take 1 teaspoon every 12 hours. This medication is available without a prescription.  Come back if your cough worsens or your start to run a fever or feel really sick.

## 2013-08-17 NOTE — Progress Notes (Signed)
S:  This 79 y.o. Asian woman has atrophic vaginitis and c/o "not feeling right when she makes urine". She has an estrogen vag cream which has helped but she has not been using it nightly. She has no hematuria or vaginal bleeding or pelvic pain.  Cough- pt is a smoker who has reduced tobacco use to < 5 cigs /day. She denies seasonal allergies. Cough is slightly prod but no hemoptysis reported. No chest pain w/ cough. No fever or chills. No weight loss. No hx of foreign travel in last 2 weeks.  Patient Active Problem List   Diagnosis Date Noted  . HTN, goal below 150/90 06/17/2013  . DJD (degenerative joint disease) 02/16/2013  . Tobacco user 09/23/2012  . Menopause 08/21/2012  . Postmenopausal atrophic vaginitis 08/21/2012   PMHx, Soc Hx and Fam Hx reviewed.  Medications reviewed.  ROS: As per HPI.  O: Filed Vitals:   08/17/13 1301  BP: 162/77  Pulse: 87  Temp: 97.9 F (36.6 C)  Resp: 16   GEN: In NAD; WN,WD. HENT: /AT; EOMI w/ clear conj/sclerae. EACs/nose/orph unremarkable (post ph mildly erythematous). COR: RRR. Normal S1 and S2. No m/g/r. LUNGS: CTA; no wheezes or rhonchi. SKIN: W&D; intact w/o erythema, lesions, rashes or pallor. NEURO: A&O x 3; CNs intact. Nonfocal. (Language barrier makes clear communication difficult).  Results for orders placed in visit on 08/17/13  POCT URINALYSIS DIPSTICK      Result Value Range   Color, UA Lt. green     Clarity, UA clear     Glucose, UA neg     Bilirubin, UA neg     Ketones, UA neg     Spec Grav, UA 1.015     Blood, UA trace     pH, UA 7.5     Protein, UA neg     Urobilinogen, UA 0.2     Nitrite, UA neg     Leukocytes, UA small (1+)      A/P: Abnormal urination - Plan: POCT urinalysis dipstick  Atrophic vaginitis - Resume nightly use of estrogen cream.  Plan: conjugated estrogens (PREMARIN) vaginal cream. Schedule follow-up for GYN exam.  Cough- due to ongoing tobacco use; consider CXR.  Trial Delsym cough syrup 1  tsp every 12 hours.  Tobacco user- Encourage tobacco cessation.

## 2013-09-30 ENCOUNTER — Encounter: Payer: Self-pay | Admitting: Family Medicine

## 2013-09-30 ENCOUNTER — Ambulatory Visit (INDEPENDENT_AMBULATORY_CARE_PROVIDER_SITE_OTHER): Payer: Medicare Other | Admitting: Family Medicine

## 2013-09-30 ENCOUNTER — Ambulatory Visit: Payer: Medicare Other | Admitting: Family Medicine

## 2013-09-30 VITALS — BP 160/68 | HR 90 | Temp 97.7°F | Resp 16 | Ht 59.0 in | Wt 108.2 lb

## 2013-09-30 DIAGNOSIS — K089 Disorder of teeth and supporting structures, unspecified: Secondary | ICD-10-CM

## 2013-09-30 DIAGNOSIS — R220 Localized swelling, mass and lump, head: Secondary | ICD-10-CM

## 2013-09-30 DIAGNOSIS — F17200 Nicotine dependence, unspecified, uncomplicated: Secondary | ICD-10-CM

## 2013-09-30 DIAGNOSIS — G8929 Other chronic pain: Secondary | ICD-10-CM

## 2013-09-30 DIAGNOSIS — I10 Essential (primary) hypertension: Secondary | ICD-10-CM

## 2013-09-30 DIAGNOSIS — F489 Nonpsychotic mental disorder, unspecified: Secondary | ICD-10-CM

## 2013-09-30 DIAGNOSIS — F419 Anxiety disorder, unspecified: Secondary | ICD-10-CM | POA: Insufficient documentation

## 2013-09-30 DIAGNOSIS — Z720 Tobacco use: Secondary | ICD-10-CM

## 2013-09-30 LAB — BASIC METABOLIC PANEL
Calcium: 9.6 mg/dL (ref 8.4–10.5)
Chloride: 102 mEq/L (ref 96–112)
Creat: 1.1 mg/dL (ref 0.50–1.10)

## 2013-09-30 MED ORDER — CLORAZEPATE DIPOTASSIUM 3.75 MG PO TABS: ORAL_TABLET | ORAL | Status: DC

## 2013-09-30 MED ORDER — LISINOPRIL 5 MG PO TABS: 5.0000 mg | ORAL_TABLET | Freq: Every day | ORAL | Status: DC

## 2013-09-30 MED ORDER — AMOXICILLIN-POT CLAVULANATE 875-125 MG PO TABS: 1.0000 | ORAL_TABLET | Freq: Two times a day (BID) | ORAL | Status: DC

## 2013-09-30 MED ORDER — BUPROPION HCL 75 MG PO TABS: 75.0000 mg | ORAL_TABLET | Freq: Two times a day (BID) | ORAL | Status: DC

## 2013-10-01 NOTE — Progress Notes (Signed)
Quick Note:  Please notify pt that results are normal.   Provide pt with copy of labs. ______ 

## 2013-10-02 ENCOUNTER — Encounter: Payer: Self-pay | Admitting: *Deleted

## 2013-10-03 NOTE — Progress Notes (Signed)
S:  This 79 y.o. Asian female is here for follow-up re: smoking cessation; she has reduced cigarette use to > 5/day but does not think she can quit.  She is complaint w/ medication prescribed (Bupropion 75 mg bid) to assist w/ cessation. Pt lives alone and has chronic anxiety and insomnia; she reports poor sleep hygiene.   Pt also has chronic dental pain; currently, she has full upper dentures and lower teeth are in poor state of repair. She "plays with" her dentures by constantly rocking her lower jaw and clicking her teeth. Her dentist has instructed her to "stop" this habit but she continues. This has resulted in pain, especially on the R side of face and jaw. She notes some discomfort on R side of throat w/ swallowing but denies dysphagia, hoarseness, fever, ear pain, facial numbness, n/v or HA. She also c/o L ear "feeling funny, like something in it". No hearing loss or ear pain.  HTN- pt is compliant w/ medication. She does consumes salt and some other food additives. Smoking habit as described above. Pt denies diaphoresis, CP or tightness, palpitations, SOB or DOE, dizziness, weakness or syncope.  Patient Active Problem List   Diagnosis Date Noted  . 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 HTN, goal below 150/90 06/17/2013  . DJD (degenerative joint disease) 02/16/2013  . Tobacco user 09/23/2012  . Menopause 08/21/2012  . Postmenopausal atrophic vaginitis 08/21/2012   PMH, Soc and Fam Hx reviewed.  Medications reconciled.  ROS: As per HPI.  O: Filed Vitals:   09/30/13 1416  BP: 160/68  Pulse: 90  Temp: 97.7 F (36.5 C)  Resp: 16   GEN: In NAD: WN,WD. Appears anxious. Weight is stable. HEENT: [place]/AT; EOMI w/ clear conj/sclerae. EACs/TMs normal. Nasal mucosa clear. Posterior ph w/ mild erythema, no exudate or lesions. Upper dentures, lower teeth have flat appearance from constant grinding. NECK: Supple w/ slightly enlarged node or mass at angle of R mandible; no similar  enlargment on L side. No other LAN or TMG. COR: RRR. LUNGS: Unlabored resp. SKIN: W&D; intact w/o diaphoresis, erythema or pallor. NEURO: A&O x 3; Cns intact. Nonfocal. PSYCH: Language barrier but pt understands much of conversation when simple terms are used. She appears mildly anxious. She is attentive and thought content is normal.  A/P: Swelling, mass, or lump in head and neck- Lengthy discussion w/ pt about possible imaging study but she did not comprehend the scheduling process and the rationale for ordering CT of head and neck. Will treat w/ antibiotics and recheck; consideration of CT still warranted given smoking hx.  Tobacco user- Strongly encouraged pt to stop smoking buy end of this year; she agrees.  Chronic dental pain- Pt needs to follow-up w/ dentist next year to have upper dentures better fitted and to consider lower plate.  Insomnia secondary to anxiety- Continue current medications. RX: Tranxene (low dose) for insomnia.  HTN, goal below 150/90 - No medication change at this time; expect BP to come under control once pt stops smoking and anxiety and sleep disturbance are controlled. Plan: Basic metabolic panel  Meds ordered this encounter  Medications  . amoxicillin-clavulanate (AUGMENTIN) 875-125 MG per tablet    Sig: Take 1 tablet by mouth 2 (two) times daily. Take this medication with food.    Dispense:  20 tablet    Refill:  0  . lisinopril (PRINIVIL,ZESTRIL) 5 MG tablet    Sig: Take 1 tablet (5 mg total) by mouth daily.    Dispense:  30 tablet    Refill:  5  . buPROPion (WELLBUTRIN) 75 MG tablet    Sig: Take 1 tablet (75 mg total) by mouth 2 (two) times daily.    Dispense:  60 tablet    Refill:  5  . clorazepate (TRANXENE) 3.75 MG tablet    Sig: Take 1 tablet at bedtime as needed for sleep.    Dispense:  30 tablet    Refill:  0

## 2013-11-04 ENCOUNTER — Ambulatory Visit (INDEPENDENT_AMBULATORY_CARE_PROVIDER_SITE_OTHER): Payer: Medicare Other | Admitting: Family Medicine

## 2013-11-04 VITALS — BP 145/77 | HR 82 | Temp 98.3°F | Resp 16 | Ht 58.5 in | Wt 106.0 lb

## 2013-11-04 DIAGNOSIS — Z720 Tobacco use: Secondary | ICD-10-CM

## 2013-11-04 DIAGNOSIS — R220 Localized swelling, mass and lump, head: Secondary | ICD-10-CM

## 2013-11-04 DIAGNOSIS — R221 Localized swelling, mass and lump, neck: Principal | ICD-10-CM

## 2013-11-04 DIAGNOSIS — R590 Localized enlarged lymph nodes: Secondary | ICD-10-CM

## 2013-11-04 DIAGNOSIS — I10 Essential (primary) hypertension: Secondary | ICD-10-CM

## 2013-11-04 DIAGNOSIS — R599 Enlarged lymph nodes, unspecified: Secondary | ICD-10-CM

## 2013-11-04 DIAGNOSIS — F17200 Nicotine dependence, unspecified, uncomplicated: Secondary | ICD-10-CM

## 2013-11-04 MED ORDER — LISINOPRIL 5 MG PO TABS: 5.0000 mg | ORAL_TABLET | Freq: Every day | ORAL | Status: DC

## 2013-11-04 MED ORDER — AMOXICILLIN-POT CLAVULANATE 875-125 MG PO TABS: 1.0000 | ORAL_TABLET | Freq: Two times a day (BID) | ORAL | Status: DC

## 2013-11-04 NOTE — Patient Instructions (Addendum)
You still have a mass or enlarged lymph node in the right side of your neck. I have prescribed the antibiotic for you to take for another 10 days as you requested. You will come back in 3 weeks. If the mass is the same, I will schedule you for a imaging test to see if this is a lymph node or mass or cyst.

## 2013-11-06 ENCOUNTER — Encounter: Payer: Self-pay | Admitting: Family Medicine

## 2013-11-06 NOTE — Progress Notes (Signed)
S:  This 80 y.o. [place] female returns for recheck of lump in the R side of her neck. She was treated w/ 10 days of Augmentin and she feels like the lump is smaller. She denies fever/chills, diaphoresis, decreased appetite, dysphagia or sore throat, rhinorrhea, sinus pain, ear pain or decreased hearing, neck pain, cough, SOB, n/v, change in stools, rash, HA or weakness. Attempts to get pt to agree to imaging test (i.e. CT head/neck) are met with resistance; there is some language barrier and communication difficulties also. Pt understands that this lump is an abnormal finding and needs further evaluation but she prefers to try the antibiotics again.   Pt is a tobacco user, having reduced use to 2-3 cigarettes daily. She reports that she is still taking Bupropion as prescribed. Pt had agreed to stop smoking by end of 2014. Smoking helps w/ chronic anxiety. Clorazepate was prescribed at last visit for sleep but pt not taking it.  HTN- pt reports compliance w/ medication w/o side effects. No c/o vision disturbance, CP or tightness, palpitations, edema, DOE, orthopnea, HA, dizziness, numbness or syncope.  Pt c/o about itchy area on upper and mid buttocks. She showed me the area and stated that it was better. She did not report a rash or using any type of anti-itch products.  Patient Active Problem List   Diagnosis Date Noted  . 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 HTN, goal below 150/90 06/17/2013  . DJD (degenerative joint disease) 02/16/2013  . Tobacco user 09/23/2012  . Menopause 08/21/2012  . Postmenopausal atrophic vaginitis 08/21/2012   PMHx and Soc HX reviewed.  Medications reconciled.  ROS: As per HPI.  O: Filed Vitals:   11/04/13 1331  BP: 145/77  Pulse: 82  Temp: 98.3 F (36.8 C)  Resp: 16   GEN: in NAD; WN,WD. Weight down 2 lbs. HENT: [place]/AT; EOMI w/ clear conj/sclerae. EACs/TMs/ nasal mucosa unremarkable. Post ph w/ mild erythema w/o exudate or lesions. NECK: Supple; R  mandible angle- 1.5 cm soft NT mass. No other palpable nodes. No TMG. COR: RRR. No m/g/r. LUNGS: CTA; No wheezes or rhonchi. SKIN: W&D; intact w/o erythema, rashes or pallor. In general, skin is dry w/ fair turgor. NEURO: A&O x 3; Cns intact. Nonfocal.  A/P: Swelling, mass, or lump in head and neck- Refill Augmentin 875-125 mg 1 tab bid x 10 days. Discussed need for further imaging to get more information about this abnormality. I have tried to explain this to pt in the most simple terms; she seems to have an understanding but it apprehensive about having to go outside this facility for imaging.  Lymphadenopathy of right cervical region- Explained to pt that she may have a serious problem given her smoking history. I have refilled antibiotic as noted above but advised that imaging will need to be scheduled at follow-up visit if abnormality persists.  HTN, goal below 150/90- Continue current medication.  Tobacco user- Strongly encouraged pt to quit. Continue Bupropion 75 mg bid.  Meds ordered this encounter  Medications  . amoxicillin-clavulanate (AUGMENTIN) 875-125 MG per tablet    Sig: Take 1 tablet by mouth 2 (two) times daily. Take this medication with food.    Dispense:  20 tablet    Refill:  0  . lisinopril (PRINIVIL,ZESTRIL) 5 MG tablet    Sig: Take 1 tablet (5 mg total) by mouth daily.    Dispense:  90 tablet    Refill:  1

## 2013-12-02 ENCOUNTER — Ambulatory Visit (INDEPENDENT_AMBULATORY_CARE_PROVIDER_SITE_OTHER): Payer: Medicare Other | Admitting: Family Medicine

## 2013-12-02 ENCOUNTER — Ambulatory Visit: Payer: Medicare Other

## 2013-12-02 ENCOUNTER — Encounter: Payer: Self-pay | Admitting: Family Medicine

## 2013-12-02 VITALS — BP 161/71 | HR 87 | Temp 98.1°F | Resp 16 | Ht 59.0 in | Wt 110.0 lb

## 2013-12-02 DIAGNOSIS — F17200 Nicotine dependence, unspecified, uncomplicated: Secondary | ICD-10-CM

## 2013-12-02 DIAGNOSIS — R220 Localized swelling, mass and lump, head: Secondary | ICD-10-CM

## 2013-12-02 DIAGNOSIS — R221 Localized swelling, mass and lump, neck: Secondary | ICD-10-CM

## 2013-12-02 DIAGNOSIS — Z720 Tobacco use: Secondary | ICD-10-CM

## 2013-12-02 DIAGNOSIS — I10 Essential (primary) hypertension: Secondary | ICD-10-CM

## 2013-12-02 LAB — CBC WITH DIFFERENTIAL/PLATELET
BASOS ABS: 0 10*3/uL (ref 0.0–0.1)
BASOS PCT: 0 % (ref 0–1)
EOS ABS: 0.3 10*3/uL (ref 0.0–0.7)
EOS PCT: 4 % (ref 0–5)
HEMATOCRIT: 31.4 % — AB (ref 36.0–46.0)
Hemoglobin: 10.7 g/dL — ABNORMAL LOW (ref 12.0–15.0)
Lymphocytes Relative: 32 % (ref 12–46)
Lymphs Abs: 2.7 10*3/uL (ref 0.7–4.0)
MCH: 31.2 pg (ref 26.0–34.0)
MCHC: 34.1 g/dL (ref 30.0–36.0)
MCV: 91.5 fL (ref 78.0–100.0)
MONO ABS: 0.5 10*3/uL (ref 0.1–1.0)
Monocytes Relative: 6 % (ref 3–12)
Neutro Abs: 4.8 10*3/uL (ref 1.7–7.7)
Neutrophils Relative %: 58 % (ref 43–77)
PLATELETS: 274 10*3/uL (ref 150–400)
RBC: 3.43 MIL/uL — ABNORMAL LOW (ref 3.87–5.11)
RDW: 14 % (ref 11.5–15.5)
WBC: 8.3 10*3/uL (ref 4.0–10.5)

## 2013-12-02 LAB — BASIC METABOLIC PANEL
BUN: 18 mg/dL (ref 6–23)
CALCIUM: 8.9 mg/dL (ref 8.4–10.5)
CHLORIDE: 105 meq/L (ref 96–112)
CO2: 25 mEq/L (ref 19–32)
CREATININE: 1.07 mg/dL (ref 0.50–1.10)
Glucose, Bld: 104 mg/dL — ABNORMAL HIGH (ref 70–99)
Potassium: 4.4 mEq/L (ref 3.5–5.3)
Sodium: 138 mEq/L (ref 135–145)

## 2013-12-02 NOTE — Progress Notes (Signed)
S:  This 80 y.o. [place] female returns for follow-up re: R sided neck mass. It has not resolved after 2 courses of antibiotics. Pt is a smoker, down to 2-3 cigarettes daily. Dental pain continues; pt has full upper plate and is missing lower molars on both sides. Pt c/o difficulty chewing but denies fever/chills, abnormal weight loss, dysphagia, neck or ear pain, decreased hearing, facial asymmetry, rash, sinus pain or drainage, epistaxis or hemoptysis.  Pt has HTN and reports taking medication daily. She denies diaphoresis, CP or tightness, palpitations, edema, SOB or DOE, cough, HA, dizziness, numbness, tremor or syncope.  Patient Active Problem List   Diagnosis Date Noted  . 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 HTN, goal below 150/90 06/17/2013  . DJD (degenerative joint disease) 02/16/2013  . Tobacco user 09/23/2012  . Menopause 08/21/2012  . Postmenopausal atrophic vaginitis 08/21/2012   Prior to Admission medications   Medication Sig Start Date End Date Taking? Authorizing Provider  buPROPion (WELLBUTRIN) 75 MG tablet Take 1 tablet (75 mg total) by mouth 2 (two) times daily. 09/30/13  Yes Barton Fanny, MD  clorazepate (TRANXENE) 3.75 MG tablet Take 1 tablet at bedtime as needed for sleep. 09/30/13  Yes Barton Fanny, MD  conjugated estrogens (PREMARIN) vaginal cream Use 1/4 applicatorful in the vaginal area every night. 08/17/13  Yes Barton Fanny, MD  lisinopril (PRINIVIL,ZESTRIL) 5 MG tablet Take 1 tablet (5 mg total) by mouth daily. 11/04/13  Yes Barton Fanny, MD  Multiple Vitamins-Minerals (MULTIVITAMIN WITH MINERALS) tablet Take 1 tablet by mouth daily.   Yes Historical Provider, MD  Naproxen Sodium (ALEVE PO) Take by mouth as needed.   Yes Historical Provider, MD  aspirin EC 81 MG tablet Take 81 mg by mouth daily.    Historical Provider, MD  diclofenac (VOLTAREN) 75 MG EC tablet Take 1 tablet twice a day with food or snack. 05/07/13   Barton Fanny, MD   PMHx, Surg Hx, Soc and Fam Hx reviewed.  ROS: As per HPI.  O: Filed Vitals:   12/02/13 1325  BP: 161/71  Pulse: 87  Temp: 98.1 F (36.7 C)  Resp: 16   GEN: In NAD; WN,WD. HENT: [place]/AT; EOMI w/ clear conj/sclerae. EACs/TMs normal. Dentition- upper denture; missing molars on lower jaw w/ flat surfaces due to constant grinding. Post ph mildly erythematous. NECK: R angle of mandible (tonsillar node?)- 1.5- 2.0 cm soft nontender immobile mass. COR: RRR. No m/g/r. LUNGS: CTA but BS distant due to poor inspiratory effort. SKIN: W&D; intact w/o erythema, rashes or pallor. NEURO: A&O x 3; CNs intact.  Nonfocal. PSYCH: Language barrier contributes to pt's anxiety and mild confusion.   [facility] reading (PRIMARY) by  Dr. Leward Quan:  CXR (compared to 2008 image)- no active disease, infiltrates or masses. Hear size is normal. Bones- osteopenic.   A/P: Localized swelling, mass or lump of neck- Chest xray looks unchanged from 2008 image. Have discussed CT scan w/ pt again; scheduled CT of neck/soft tissue w/ contrast. Pt will return to clinic after scan to discuss results. She may require ENT evaluation.  Tobacco user - Counseled pt to discontinue onor before next birthday. Plan: DG Chest 2 View  HTN, goal below 150/90- Continue same medication; bring all medicine bottles to next visit.

## 2013-12-02 NOTE — Patient Instructions (Signed)
You are going to have a special type of imaging done to look at the tissues in your neck. This is the best way to evaluate the lump in your neck. Try to stop smoking before your birthday on December 24, 2011.  I will see you back in 2-3 weeks to go over the results of the scan. Please keep this appointment. If you have any questions, call us.

## 2013-12-03 LAB — SEDIMENTATION RATE: Sed Rate: 38 mm/hr — ABNORMAL HIGH (ref 0–22)

## 2013-12-03 NOTE — Progress Notes (Signed)
Quick Note:  I will review these results with pt when she returns on 12/16/13.  ______

## 2013-12-08 ENCOUNTER — Other Ambulatory Visit: Payer: Self-pay

## 2013-12-09 ENCOUNTER — Other Ambulatory Visit: Payer: Self-pay

## 2013-12-14 ENCOUNTER — Ambulatory Visit
Admission: RE | Admit: 2013-12-14 | Discharge: 2013-12-14 | Disposition: A | Discharge status: Home / Self Care | Payer: Medicare Other | Source: Ambulatory Visit | Attending: Family Medicine | Admitting: Family Medicine

## 2013-12-14 DIAGNOSIS — R221 Localized swelling, mass and lump, neck: Secondary | ICD-10-CM

## 2013-12-14 DIAGNOSIS — Z720 Tobacco use: Secondary | ICD-10-CM

## 2013-12-14 MED ORDER — IOHEXOL 300 MG/ML  SOLN
75.0000 mL | Freq: Once | INTRAMUSCULAR | Status: AC | PRN
  Administered 2013-12-14: 75 mL via INTRAVENOUS

## 2013-12-16 ENCOUNTER — Ambulatory Visit: Payer: Medicare Other | Admitting: Family Medicine

## 2013-12-23 ENCOUNTER — Ambulatory Visit (INDEPENDENT_AMBULATORY_CARE_PROVIDER_SITE_OTHER): Payer: Medicare Other | Admitting: Family Medicine

## 2013-12-23 VITALS — BP 158/68 | HR 88 | Temp 98.0°F | Resp 16 | Ht 58.5 in | Wt 109.0 lb

## 2013-12-23 DIAGNOSIS — F419 Anxiety disorder, unspecified: Secondary | ICD-10-CM

## 2013-12-23 DIAGNOSIS — F5105 Insomnia due to other mental disorder: Secondary | ICD-10-CM

## 2013-12-23 DIAGNOSIS — D649 Anemia, unspecified: Secondary | ICD-10-CM

## 2013-12-23 DIAGNOSIS — R220 Localized swelling, mass and lump, head: Secondary | ICD-10-CM

## 2013-12-23 DIAGNOSIS — R221 Localized swelling, mass and lump, neck: Principal | ICD-10-CM

## 2013-12-23 DIAGNOSIS — F411 Generalized anxiety disorder: Secondary | ICD-10-CM

## 2013-12-23 NOTE — Patient Instructions (Addendum)
I have advised you to see EAR, NOSE and THROAT Specialist about the lump on the right side of your neck. You do not want to do this right now but I want you to come sooner if you have pain or the lump gets bigger.   Iron Deficiency Anemia, Adult Anemia is when you have a low number of healthy red blood cells. It is often caused by too little iron. This is called iron deficiency anemia. It may make you tired and short of breath. HOME CARE   Take iron as told by your doctor.  Take vitamins as told by your doctor.  Eat foods that have iron in them. This includes liver, lean beef, whole-grain bread, eggs, dried fruit, and dark green leafy vegetables. GET HELP RIGHT AWAY IF:  You pass out (faint).  You have chest pain.  You feel sick to your stomach (nauseous) or throw up (vomit).  You get very short of breath with activity.  You are weak.  You have a fast heartbeat.  You start to sweat for no reason.  You become lightheaded when getting up from a chair or bed. MAKE SURE YOU:  Understand these instructions.  Will watch your condition.  Will get help right away if you are not doing well or get worse. Document Released: 11/09/2010 Document Revised: 07/28/2013 Document Reviewed: 06/14/2013 [facility] Patient Information 2014 [place].

## 2013-12-24 ENCOUNTER — Encounter: Payer: Self-pay | Admitting: Family Medicine

## 2013-12-24 NOTE — Progress Notes (Signed)
S:  This 80 y.o. [place] female returns for follow-up after CT Soft Tissue Neck w/ Contrast to evaluate  Neck lump. Pt denies pain or dysphagia or increase in size of the mass. She attributes this mass to poor dentition; she has dental pain and poorly- fitting dentures. Pt has not had fever/chills, ear pain, sore throat, sinus pain or pressure, cough or SOB, n/v, HA,  hearing loss, numbness, weakness or syncope.  I have advised pt that she is at risk for serious problem, such as cancer, given her tobacco use and poor dentition. Language barrier prevents pt from comprehending potential risk of not pursuing further evaluation. I have advised that she see ENT specialist. She does not want to do that at this time; she thinks lump is smaller and wants "to wait a few months".  Patient Active Problem List   Diagnosis Date Noted  . 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 HTN, goal below 150/90 06/17/2013  . DJD (degenerative joint disease) 02/16/2013  . Tobacco user 09/23/2012  . Menopause 08/21/2012  . Postmenopausal atrophic vaginitis 08/21/2012   Prior to Admission medications   Medication Sig Start Date End Date Taking? Authorizing Provider  buPROPion (WELLBUTRIN) 75 MG tablet Take 1 tablet (75 mg total) by mouth 2 (two) times daily. 09/30/13  Yes Barton Fanny, MD  clorazepate (TRANXENE) 3.75 MG tablet Take 1 tablet at bedtime as needed for sleep. 09/30/13  Yes Barton Fanny, MD  lisinopril (PRINIVIL,ZESTRIL) 5 MG tablet Take 1 tablet (5 mg total) by mouth daily. 11/04/13  Yes Barton Fanny, MD  Multiple Vitamins-Minerals (MULTIVITAMIN WITH MINERALS) tablet Take 1 tablet by mouth daily.   Yes Historical Provider, MD  Naproxen Sodium (ALEVE PO) Take by mouth as needed.   Yes Historical Provider, MD  aspirin EC 81 MG tablet Take 81 mg by mouth daily.    Historical Provider, MD  conjugated estrogens (PREMARIN) vaginal cream Use 1/4 applicatorful in the vaginal area every night.  08/17/13   Barton Fanny, MD  diclofenac (VOLTAREN) 75 MG EC tablet Take 1 tablet twice a day with food or snack. 05/07/13   Barton Fanny, MD   PMHx, Surg Hx, Soc and Fam Hx reviewed.  ROS: As per HPI. She has not lost any weight.  O: Filed Vitals:   12/23/13 1444  BP: 158/68  Pulse: 88  Temp: 98 F (36.7 C)  Resp: 16   GEN: In NAD; WN,WD. HENT: [place]/AT. EOMI w/ clear conj/sclerae. Dentition poor. Bruxism noted. NECK: R side-  2.5- 3.0 cm NT immobile mass near angle of mandible. No other palpable nodes. COR: RRR. LUNGS: CTA; normal resp rate and effort. SKIN: W&D; intact w/o diaphoresis, erythema or pallor. NEURO: A&O x 3; CNs intact. Nonfocal. PSYCH: Pleasant and attentive but somewhat anxious.   2/112/2015: CT Soft Tissue Neck W Contrast- 3.4 cm solid R neck mass; differential includes                     Malignant lymph node, salivary gland tumor. Small right node adjacent to                                    dominant mass which is hyper-enhancing, similar density to mass.                     Smaller but similar density left parotid mass,  raising the possibiity of multiple                             benign salivary gland tumors (such as Warthin's tumors). SEE FULL REPORT.   A/P: Swelling, mass, or lump in head and neck- Strongly recommended ENT evaluation but pt declined. I reviewed the CT results w/ her and advised her that this could be a malignancy. ENT specialist could do a biopsy for definitive diagnosis.  Pt declined referral but would consider it in 3-4 months; she would not agree to return to clinic any sooner than 3 months. She is encouraged to return sooner if mass increases in size, becomes painful or she has problems swallowing.  Anemia- H/H 10.7/ 31.4 compared to normal H/H in April 2014.  Insomnia secondary to anxiety- Pt has medication (Clorazepate 3.75 mg) to use at bedtime prn but she rarely takes it. I encouraged her to continue taking the  Bupropion 75 mg bid.

## 2014-03-31 ENCOUNTER — Ambulatory Visit (INDEPENDENT_AMBULATORY_CARE_PROVIDER_SITE_OTHER): Payer: Medicare Other | Admitting: Family Medicine

## 2014-03-31 ENCOUNTER — Encounter: Payer: Self-pay | Admitting: Family Medicine

## 2014-03-31 VITALS — BP 160/78 | HR 87 | Temp 97.0°F | Resp 16 | Ht 58.25 in | Wt 104.8 lb

## 2014-03-31 DIAGNOSIS — R221 Localized swelling, mass and lump, neck: Secondary | ICD-10-CM

## 2014-03-31 DIAGNOSIS — L723 Sebaceous cyst: Secondary | ICD-10-CM

## 2014-03-31 DIAGNOSIS — R220 Localized swelling, mass and lump, head: Secondary | ICD-10-CM

## 2014-03-31 DIAGNOSIS — I10 Essential (primary) hypertension: Secondary | ICD-10-CM

## 2014-03-31 DIAGNOSIS — R590 Localized enlarged lymph nodes: Secondary | ICD-10-CM | POA: Insufficient documentation

## 2014-03-31 DIAGNOSIS — L720 Epidermal cyst: Secondary | ICD-10-CM

## 2014-03-31 MED ORDER — LISINOPRIL 5 MG PO TABS: 5.0000 mg | ORAL_TABLET | Freq: Every day | ORAL | Status: DC

## 2014-03-31 MED ORDER — CEPHALEXIN 500 MG PO CAPS: 500.0000 mg | ORAL_CAPSULE | Freq: Three times a day (TID) | ORAL | Status: DC

## 2014-03-31 NOTE — Patient Instructions (Addendum)
Epidermal Cyst An epidermal cyst is usually a small, painless lump under the skin. Cysts often occur on the face, neck, stomach, chest, or genitals. The cyst may be filled with a bad smelling paste. Do not pop your cyst. Popping the cyst can cause pain and puffiness (swelling). HOME CARE   Only take medicines as told by your doctor.  Take your medicine (antibiotics) as told. Finish it even if you start to feel better. GET HELP RIGHT AWAY IF:  Your cyst is tender, red, or puffy.  You are not getting better, or you are getting worse.  You have any questions or concerns. MAKE SURE YOU:  Understand these instructions.  Will watch your condition.  Will get help right away if you are not doing well or get worse. Document Released: 11/14/2004 Document Revised: 04/07/2012 Document Reviewed: 04/15/2011 [facility] Patient Information 2014 [place], [place].

## 2014-04-02 NOTE — Progress Notes (Signed)
S:  This 80 y.o. Asian female is here for evaluation os skin lesion on anterior chest.  It appeared several weeks ago. It is painless and w/o erythema or drainage. She denies injury or insect bite. Pt denies fever/chills, sore throat, cough, rash or other skin lesions, myalgias or HA.   HTN- Pt states compliance w/ BP medication. Not sleeping well; takes Clorazepate infrequently. She denies diaphoresis, fatigue, vision disturbances, CP or tightness, palpitations, SOB or DOE, cough, edema, HA, dizziness, numbness, weakness or syncope,  Pt has a swelling or mass on the R side of her throat; CT of soft tissue of neck performed in Feb 2015. Imaging confirmed a 3.4 cm solid mass which could be malignant lymph node or salivary gland tumor. Pt insists that she has no trouble swallowing and that this mass does not concern her. Tobacco use continues (< 1/2 ppd); pt still trying to quit. I again suggested ENT evaluation but pt declines at this time.  Patient Active Problem List   Diagnosis Date Noted  . Swelling, mass, or lump in head and neck 03/31/2014  . 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 HTN, goal below 150/90 06/17/2013  . DJD (degenerative joint disease) 02/16/2013  . Tobacco user 09/23/2012  . Menopause 08/21/2012  . Postmenopausal atrophic vaginitis 08/21/2012    Prior to Admission medications   Medication Sig Start Date End Date Taking? Authorizing Provider  Acetaminophen (TYLENOL PO) Take by mouth every other day.   Yes Historical Provider, MD  buPROPion (WELLBUTRIN) 75 MG tablet Take 1 tablet (75 mg total) by mouth 2 (two) times daily. 09/30/13  Yes Barton Fanny, MD  lisinopril (PRINIVIL,ZESTRIL) 5 MG tablet Take 1 tablet (5 mg total) by mouth daily.   Yes Barton Fanny, MD  Naproxen Sodium (ALEVE PO) Take by mouth as needed.   Yes Historical Provider, MD  aspirin EC 81 MG tablet Take 81 mg by mouth daily.    Historical Provider, MD  clorazepate (TRANXENE) 3.75 MG  tablet Take 1 tablet at bedtime as needed for sleep. 09/30/13   Barton Fanny, MD  conjugated estrogens (PREMARIN) vaginal cream Use 1/4 applicatorful in the vaginal area every night. 08/17/13   Barton Fanny, MD  diclofenac (VOLTAREN) 75 MG EC tablet Take 1 tablet twice a day with food or snack. 05/07/13   Barton Fanny, MD  Multiple Vitamins-Minerals (MULTIVITAMIN WITH MINERALS) tablet Take 1 tablet by mouth daily.    Historical Provider, MD   PMHx, Surg Hx, Soc and Fam Hx reviewed.  ROS: As per HPI.  O: Filed Vitals:   03/31/14 1301  BP: 160/78  Pulse: 87  Temp: 97 F (36.1 C)  Resp: 16   GEN: In NAD: WN,WD. HENT: [place]/AT; EOMI w/ clear conj/sclerae. Oroph moist, slightly erythematous especially behind R posterior tonsillar pillar; no discrete oral lesions seen. Ill-fitting dentures. NECK: Supple; firm 3-4 cm fixed mass at angle of mandible. LUNGS: Unlabored resp. COR: RRR. No edema. SKIN: Anterior chest wall- 1 cm NT raised cystic mass w/ central black core. No substance expressed.  NEURO: A&O x 3; CNs intact. Nonfocal.   A/P: Epidermal cyst- Trial of Cephalexin 500 mg 1 capsule tid; moist heat.  HTN, goal below 150/90; continue current medication; encouraged smoking cessation again.  Swelling, mass, or lump in head and neck- Recommended ENT evaluation, pt declines.  Meds ordered this encounter  Medications  . cephALEXin (KEFLEX) 500 MG capsule    Sig: Take 1 capsule (500 mg  total) by mouth 3 (three) times daily.    Dispense:  30 capsule    Refill:  0  . lisinopril (PRINIVIL,ZESTRIL) 5 MG tablet    Sig: Take 1 tablet (5 mg total) by mouth daily.    Dispense:  90 tablet    Refill:  1

## 2014-04-12 ENCOUNTER — Telehealth: Payer: Self-pay

## 2014-04-12 NOTE — Telephone Encounter (Signed)
Pt states she is still having pain with cyst area,would like more meds,she would like [place] to call her neighbor Titus Mould because She speaks little english??  The number for Liliane Channel is [phone] The number for tiaja is [phone]

## 2014-04-12 NOTE — Telephone Encounter (Signed)
Spoke to pt, she stated her pain was only every now and then. Not constant. i explained that she could take either aleve or tylenol for the pain, but if the cyst returned or worsened to come back in for a recheck.

## 2014-07-05 ENCOUNTER — Encounter: Payer: Self-pay | Admitting: Family Medicine

## 2014-07-05 ENCOUNTER — Ambulatory Visit (INDEPENDENT_AMBULATORY_CARE_PROVIDER_SITE_OTHER): Payer: Medicare Other | Admitting: Family Medicine

## 2014-07-05 VITALS — BP 122/74 | HR 86 | Temp 98.4°F | Resp 20 | Ht <= 58 in | Wt 104.0 lb

## 2014-07-05 DIAGNOSIS — R221 Localized swelling, mass and lump, neck: Principal | ICD-10-CM

## 2014-07-05 DIAGNOSIS — R220 Localized swelling, mass and lump, head: Secondary | ICD-10-CM

## 2014-07-05 DIAGNOSIS — Z1211 Encounter for screening for malignant neoplasm of colon: Secondary | ICD-10-CM

## 2014-07-05 DIAGNOSIS — Z1212 Encounter for screening for malignant neoplasm of rectum: Secondary | ICD-10-CM

## 2014-07-05 DIAGNOSIS — I10 Essential (primary) hypertension: Secondary | ICD-10-CM

## 2014-07-05 DIAGNOSIS — L723 Sebaceous cyst: Secondary | ICD-10-CM

## 2014-07-05 DIAGNOSIS — L720 Epidermal cyst: Secondary | ICD-10-CM

## 2014-07-05 MED ORDER — LISINOPRIL 5 MG PO TABS: 5.0000 mg | ORAL_TABLET | Freq: Every day | ORAL | Status: DC

## 2014-07-05 NOTE — Patient Instructions (Addendum)
Continue taking BP medication- Lisinopril.   I have ordered a GI referral for you; you saw Dr. Collene Mares in 2004 and I am asking that you be scheduled with her again.  For joint pain- Aleve twice a day with food or snack is good for you. Also, get a Fish Oil supplement and Oscal + D or Citracal + D tohelp with bone and joint pain.  I will see you again in 3 months.

## 2014-07-06 NOTE — Progress Notes (Signed)
S:  This 80 y.o. Asian female returns for re-check of skin lesion on chest. She is accompanied by her friend, Debroah Baller. The cyst has not changed and is not drainging; friend notes that pt picks at the lesion daily and worries about cancer.   Pt has a neck mass on R that she refuses to have evaluated by ENT. She denies fever/chills, abnormal weight loss, neck pain, sore throat, swallowing difficulties, ear pain, decreased hearing, CP, cough,, rash, HA, dizziness, numbness or weakness. Pt continues to smoke. She stopped taking Bupropion.  Pt also c/o RLQ abd discomfort; BMs are normal. She has no n/v/d. Her friend, Harriette Bouillon, states that pt had a surgical procedure many years ago while living in her native country. No clear hx available from pt. Pt is overdue for colonoscopy.   HTN is stable and well controlled on current medication. No adverse effects.  Pt c/o generalized joint pain; her friend counsels pt about normal aches and pains of advancing age (she is in early 41s). She encourages pt to be more active and to stretch and moves more daily.    Patient Active Problem List   Diagnosis Date Noted  . Swelling, mass, or lump in head and neck 03/31/2014  . 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 HTN, goal below 150/90 06/17/2013  . DJD (degenerative joint disease) 02/16/2013  . Tobacco user 09/23/2012  . Menopause 08/21/2012  . Postmenopausal atrophic vaginitis 08/21/2012    Prior to Admission medications   Medication Sig Start Date End Date Taking? Authorizing Provider  Acetaminophen (TYLENOL PO) Take by mouth every other day.   Yes Historical Provider, MD  aspirin EC 81 MG tablet Take 81 mg by mouth daily.   Yes Historical Provider, MD  lisinopril (PRINIVIL,ZESTRIL) 5 MG tablet Take 1 tablet (5 mg total) by mouth daily.   Yes Barton Fanny, MD  Multiple Vitamins-Minerals (MULTIVITAMIN WITH MINERALS) tablet Take 1 tablet by mouth daily.   Yes Historical Provider, MD  Naproxen Sodium  (ALEVE PO) Take by mouth as needed.   Yes Historical Provider, MD    History   Social History  . Marital Status: Widowed    Spouse Name: N/A    Number of Children: N/A  . Years of Education: N/A   Occupational History  . Not on file.   Social History Main Topics  . Smoking status: Current Every Day Smoker -- 0.70 packs/day for 60 years    Types: Cigarettes  . Smokeless tobacco: Not on file  . Alcohol Use: No  . Drug Use: No  . Sexual Activity: Not on file   Other Topics Concern  . Not on file   Social History Narrative  . No narrative on file    ROS: As per HPI.   O: Filed Vitals:   07/05/14 1309  BP: 122/74  Pulse: 86  Temp: 98.4 F (36.9 C)  Resp: 20    GEN: in NAD; WN,WD. HENT: /AT. EOMI w/ clear conj/sclerae. Otherwise unremarkable. NECK: R submandibular area- 3.0 cm NT immobile mass near angle of jaw. No palpable nodes. COR: RRR. LUNGS: Unlabored resp. SKIN: R upper anterior chest wall- 1 cm NT raised cystic mass w/ central black core; no erythema. NEURO: A&O x 3; CNs intact. Nonfocal.   A/P: Swelling, mass, or lump in head and neck- I spoke w/ Debroah Baller about my concerns that this may be a cancerous mass; pt continues to smoke as well and declines expert evaluation w/ ENT.  Epidermoid cyst of skin of chest- Benign; advised pt no to pick at lesion. She can apply moist heat to area to help it open up and drain.  HTN, goal below 150/90- Stable on current medication.  Screening for colorectal cancer - Plan: Ambulatory referral to Gastroenterology, Ambulatory referral to Gastroenterology   Meds ordered this encounter  Medications  . lisinopril (PRINIVIL,ZESTRIL) 5 MG tablet    Sig: Take 1 tablet (5 mg total) by mouth daily.    Dispense:  30 tablet    Refill:  5

## 2014-07-07 ENCOUNTER — Telehealth: Payer: Self-pay

## 2014-07-07 NOTE — Telephone Encounter (Signed)
Dr. Leward Quan,  Pt had a friend call for her since she does not speak good english. Pt is still worried about the "lumps" on her neck and chest. Pt is also worried about a mole she has on her chest. She feels the mole has grown. I said if she is concerned about the "lumps" and mole that she should return for a re-check and possible get a referral.

## 2014-07-08 NOTE — Telephone Encounter (Signed)
Pt could be seen at [facility] by a PA who can open up the cyst on her chest; it is not tender or draining (not infected) but pt is not comfortable with "doing nothing". Advise her of this option to be seen at [facility] this weekend, if that is convenient for her.

## 2014-07-09 NOTE — Telephone Encounter (Signed)
Spoke with pt's friend. She will bring pt in on Monday

## 2014-07-11 ENCOUNTER — Encounter: Payer: Self-pay | Admitting: Family Medicine

## 2014-07-11 ENCOUNTER — Ambulatory Visit (INDEPENDENT_AMBULATORY_CARE_PROVIDER_SITE_OTHER): Payer: Medicare Other | Admitting: Family Medicine

## 2014-07-11 VITALS — BP 139/67 | HR 77 | Temp 97.5°F | Resp 14 | Ht 58.25 in | Wt 101.8 lb

## 2014-07-11 DIAGNOSIS — Z1239 Encounter for other screening for malignant neoplasm of breast: Secondary | ICD-10-CM

## 2014-07-11 DIAGNOSIS — L720 Epidermal cyst: Secondary | ICD-10-CM

## 2014-07-11 DIAGNOSIS — L723 Sebaceous cyst: Secondary | ICD-10-CM

## 2014-07-11 DIAGNOSIS — D649 Anemia, unspecified: Secondary | ICD-10-CM

## 2014-07-11 DIAGNOSIS — R220 Localized swelling, mass and lump, head: Secondary | ICD-10-CM

## 2014-07-11 DIAGNOSIS — R221 Localized swelling, mass and lump, neck: Secondary | ICD-10-CM

## 2014-07-11 DIAGNOSIS — I10 Essential (primary) hypertension: Secondary | ICD-10-CM

## 2014-07-11 DIAGNOSIS — Z23 Encounter for immunization: Secondary | ICD-10-CM

## 2014-07-11 LAB — COMPREHENSIVE METABOLIC PANEL
ALT: 22 U/L (ref 0–35)
AST: 23 U/L (ref 0–37)
Albumin: 4.3 g/dL (ref 3.5–5.2)
Alkaline Phosphatase: 88 U/L (ref 39–117)
BILIRUBIN TOTAL: 0.3 mg/dL (ref 0.2–1.2)
BUN: 21 mg/dL (ref 6–23)
CO2: 25 mEq/L (ref 19–32)
Calcium: 9.5 mg/dL (ref 8.4–10.5)
Chloride: 107 mEq/L (ref 96–112)
Creat: 1.12 mg/dL — ABNORMAL HIGH (ref 0.50–1.10)
GLUCOSE: 95 mg/dL (ref 70–99)
Potassium: 4.7 mEq/L (ref 3.5–5.3)
Sodium: 139 mEq/L (ref 135–145)
Total Protein: 7.8 g/dL (ref 6.0–8.3)

## 2014-07-11 LAB — CBC WITH DIFFERENTIAL/PLATELET
Basophils Absolute: 0.1 10*3/uL (ref 0.0–0.1)
Basophils Relative: 1 % (ref 0–1)
EOS ABS: 0.3 10*3/uL (ref 0.0–0.7)
Eosinophils Relative: 4 % (ref 0–5)
HEMATOCRIT: 33.1 % — AB (ref 36.0–46.0)
HEMOGLOBIN: 11 g/dL — AB (ref 12.0–15.0)
Lymphocytes Relative: 32 % (ref 12–46)
Lymphs Abs: 2.5 10*3/uL (ref 0.7–4.0)
MCH: 31.3 pg (ref 26.0–34.0)
MCHC: 33.2 g/dL (ref 30.0–36.0)
MCV: 94 fL (ref 78.0–100.0)
MONO ABS: 0.6 10*3/uL (ref 0.1–1.0)
MONOS PCT: 8 % (ref 3–12)
NEUTROS PCT: 55 % (ref 43–77)
Neutro Abs: 4.3 10*3/uL (ref 1.7–7.7)
Platelets: 271 10*3/uL (ref 150–400)
RBC: 3.52 MIL/uL — ABNORMAL LOW (ref 3.87–5.11)
RDW: 14.2 % (ref 11.5–15.5)
WBC: 7.8 10*3/uL (ref 4.0–10.5)

## 2014-07-11 MED ORDER — LISINOPRIL 5 MG PO TABS: 5.0000 mg | ORAL_TABLET | Freq: Every day | ORAL | Status: DC

## 2014-07-11 NOTE — Progress Notes (Signed)
Subjective:    Patient ID: Barbara Dennis, female    DOB: 26-Oct-1933, 80 y.o.   MRN: [id number]  07/11/2014  other   HPI This 80 y.o. female presents with friend for evaluation of the following:  1.  Chest wall skin swelling: evaluated last week by Dr. Leward Quan; diagnosed with epidermoid cyst; reassurance provided; recommended warm compresses and massage.  Onset three months ago.  Area became tender yesterday.  Wants resected. Worried about skin cancer.    2.  Neck swelling/mass:  Onset one year ago. Evaluated on 11/2013 by Dr. Leward Quan.  S/p CT neck in 11/2013 (3.4cm solid R neck mass near angle of R mandible concerning for malignant lymph node vs. Salivary gland tumor; small R IIa node adjacent to dominant mass which hyperenhances; smaller but similar dnesity L parotid mass measuring 36m deep load of L parotid); pt has refused referral to ENT at that time and also refused referral at visit last week.  S/p CXR in 11/2013 that was negative.  Friend accompanying pt today and pt now agreeable to ENT consultation.  3.  Health Maintenance: not interested in mammogram due to transportation issues but friend agreeable to transporting for mammogram; pt does not drive.  Flu vaccine makes sick; does not want one this year.  No previous Pneumovax or Prevnar; agreeable to Prevnar-13.    4. Anemia: found on labs 11/2013; anemia was new for pt.  Scheduled for colonoscopy next month.    5. HTN: compliance with Lisinopril daily.   Needs refill .  Due for Creatinine and K levels.  Sporadic chest pain.  Chronic cough but smoker.    Review of Systems  Constitutional: Negative for fever, chills, diaphoresis and fatigue.  HENT: Negative for congestion, ear pain, postnasal drip, rhinorrhea, sinus pressure, sore throat, trouble swallowing and voice change.   Respiratory: Positive for cough. Negative for shortness of breath, wheezing and stridor.   Gastrointestinal: Positive for abdominal pain and constipation.  Negative for nausea, vomiting and diarrhea.  Skin: Positive for wound. Negative for color change, pallor and rash.    Past Medical History  Diagnosis Date  . Hypertension    Past Surgical History  Procedure Laterality Date  . Appendectomy     No Known Allergies Current Outpatient Prescriptions  Medication Sig Dispense Refill  . Acetaminophen (TYLENOL PO) Take by mouth every other day.      . lisinopril (PRINIVIL,ZESTRIL) 5 MG tablet Take 1 tablet (5 mg total) by mouth daily.  90 tablet  1  . Multiple Vitamins-Minerals (MULTIVITAMIN WITH MINERALS) tablet Take 1 tablet by mouth daily.      . Naproxen Sodium (ALEVE PO) Take by mouth as needed.      .Marland Kitchenaspirin EC 81 MG tablet Take 81 mg by mouth daily.       No current facility-administered medications for this visit.   History   Social History  . Marital Status: Widowed    Spouse Name: N/A    Number of Children: N/A  . Years of Education: N/A   Occupational History  . Not on file.   Social History Main Topics  . Smoking status: Current Every Day Smoker -- 0.70 packs/day for 60 years    Types: Cigarettes  . Smokeless tobacco: Not on file  . Alcohol Use: No  . Drug Use: No  . Sexual Activity: Not on file   Other Topics Concern  . Not on file   Social History Narrative   Marital status: widowed  28 years ago.  From [place]; moved to [place] 1974.     Children: none       Lives:  Alone; brother in law is Psychologist, sport and exercise who is 44.      Employment: retired.       Tobacco:  1/2 ppd       Alcohol:  None      Exercise:  Walking daily.      ADLs: no driving.        Objective:    BP 139/67  Pulse 77  Temp(Src) 97.5 F (36.4 C) (Oral)  Resp 14  Ht 4' 10.25" (1.48 m)  Wt 101 lb 12.8 oz (46.176 kg)  BMI 21.08 kg/m2  SpO2 96% Physical Exam  Constitutional: She is oriented to person, place, and time. She appears well-developed and well-nourished. No distress.  HENT:  Head: Normocephalic and atraumatic.  Right Ear: External ear  normal.  Left Ear: External ear normal.  Nose: Nose normal.  Mouth/Throat: Oropharynx is clear and moist. Abnormal dentition.  Poor dentition.  Eyes: Conjunctivae and EOM are normal. Pupils are equal, round, and reactive to light.  Neck: Normal range of motion. Neck supple. Carotid bruit is not present. No thyromegaly present.  Cardiovascular: Normal rate, regular rhythm, normal heart sounds and intact distal pulses.  Exam reveals no gallop and no friction rub.   No murmur heard. Pulmonary/Chest: Effort normal and breath sounds normal. She has no wheezes. She has no rales. Right breast exhibits no inverted nipple, no mass, no nipple discharge, no skin change and no tenderness. Left breast exhibits no inverted nipple, no mass, no nipple discharge, no skin change and no tenderness.    +cystic subcutaneous lesion mobile 34m along R chest wall; no associated erythema, induration, tenderness.    Abdominal: Soft. Bowel sounds are normal. She exhibits no distension and no mass. There is no tenderness. There is no rebound and no guarding.  Well healed incision RLQ.  Lymphadenopathy:    She has no cervical adenopathy.  Neurological: She is alert and oriented to person, place, and time. No cranial nerve deficit.  Skin: Skin is warm and dry. No rash noted. She is not diaphoretic. No erythema. No pallor.  Psychiatric: She has a normal mood and affect. Her behavior is normal.   Results for orders placed in visit on 12/02/13  CBC WITH DIFFERENTIAL      Result Value Ref Range   WBC 8.3  4.0 - 10.5 K/uL   RBC 3.43 (*) 3.87 - 5.11 MIL/uL   Hemoglobin 10.7 (*) 12.0 - 15.0 g/dL   HCT 31.4 (*) 36.0 - 46.0 %   MCV 91.5  78.0 - 100.0 fL   MCH 31.2  26.0 - 34.0 pg   MCHC 34.1  30.0 - 36.0 g/dL   RDW 14.0  11.5 - 15.5 %   Platelets 274  150 - 400 K/uL   Neutrophils Relative % 58  43 - 77 %   Neutro Abs 4.8  1.7 - 7.7 K/uL   Lymphocytes Relative 32  12 - 46 %   Lymphs Abs 2.7  0.7 - 4.0 K/uL   Monocytes  Relative 6  3 - 12 %   Monocytes Absolute 0.5  0.1 - 1.0 K/uL   Eosinophils Relative 4  0 - 5 %   Eosinophils Absolute 0.3  0.0 - 0.7 K/uL   Basophils Relative 0  0 - 1 %   Basophils Absolute 0.0  0.0 - 0.1 K/uL   Smear  Review Criteria for review not met    BASIC METABOLIC PANEL      Result Value Ref Range   Sodium 138  135 - 145 mEq/L   Potassium 4.4  3.5 - 5.3 mEq/L   Chloride 105  96 - 112 mEq/L   CO2 25  19 - 32 mEq/L   Glucose, Bld 104 (*) 70 - 99 mg/dL   BUN 18  6 - 23 mg/dL   Creat 1.07  0.50 - 1.10 mg/dL   Calcium 8.9  8.4 - 10.5 mg/dL  SEDIMENTATION RATE      Result Value Ref Range   Sed Rate 38 (*) 0 - 22 mm/hr   PREVNAR-13 ADMINISTERED.    Assessment & Plan:   1. Breast cancer screening   2. Mass of right side of neck   3. Anemia, unspecified   4. Essential hypertension, benign   5. Epidermal cyst   6. Need for vaccination with 13-polyvalent pneumococcal conjugate vaccine     1.  HTN: controlled; obtain labs; refill provided. 2.  Anemia:  New in 11/2013; repeat today.  Scheduled for colonoscopy next month.  Asymptomatic. 3.  R mandible mass:  Persistent; pt now agreeable to ENT consultation; friend to transport to appointment.  Expressed my concern of malignancy. 4.  Epidermal cyst chest wall:  Persistent; no evidence of secondary infection; reassurance provided.  No indication for resection at this time.   5.  Breast cancer screening: refer for screening mammogram. 6. S/p Prevnar-13.  Pt refused influenza vaccine.  Meds ordered this encounter  Medications  . lisinopril (PRINIVIL,ZESTRIL) 5 MG tablet    Sig: Take 1 tablet (5 mg total) by mouth daily.    Dispense:  90 tablet    Refill:  1    No Follow-up on file.    Reginia Forts, M.D.  [facility] [address] [place], [place]  [address] [phone] phone [phone] fax

## 2014-08-16 ENCOUNTER — Ambulatory Visit: Payer: Medicare Other

## 2014-10-04 ENCOUNTER — Ambulatory Visit (INDEPENDENT_AMBULATORY_CARE_PROVIDER_SITE_OTHER): Payer: Medicare Other | Admitting: Family Medicine

## 2014-10-04 ENCOUNTER — Telehealth: Payer: Self-pay | Admitting: Family Medicine

## 2014-10-04 ENCOUNTER — Ambulatory Visit (INDEPENDENT_AMBULATORY_CARE_PROVIDER_SITE_OTHER): Payer: Medicare Other

## 2014-10-04 ENCOUNTER — Encounter: Payer: Self-pay | Admitting: Family Medicine

## 2014-10-04 VITALS — BP 114/66 | HR 93 | Temp 98.4°F | Resp 16 | Ht 58.5 in | Wt 95.6 lb

## 2014-10-04 DIAGNOSIS — R05 Cough: Secondary | ICD-10-CM

## 2014-10-04 DIAGNOSIS — R634 Abnormal weight loss: Secondary | ICD-10-CM

## 2014-10-04 DIAGNOSIS — R220 Localized swelling, mass and lump, head: Secondary | ICD-10-CM

## 2014-10-04 DIAGNOSIS — Z720 Tobacco use: Secondary | ICD-10-CM

## 2014-10-04 DIAGNOSIS — R221 Localized swelling, mass and lump, neck: Secondary | ICD-10-CM

## 2014-10-04 DIAGNOSIS — R059 Cough, unspecified: Secondary | ICD-10-CM

## 2014-10-04 DIAGNOSIS — J209 Acute bronchitis, unspecified: Secondary | ICD-10-CM

## 2014-10-04 DIAGNOSIS — I10 Essential (primary) hypertension: Secondary | ICD-10-CM

## 2014-10-04 MED ORDER — LEVOFLOXACIN 500 MG PO TABS: 500.0000 mg | ORAL_TABLET | Freq: Every day | ORAL | Status: DC

## 2014-10-04 MED ORDER — LISINOPRIL 5 MG PO TABS: 5.0000 mg | ORAL_TABLET | Freq: Every day | ORAL | Status: DC

## 2014-10-04 NOTE — Progress Notes (Signed)
Subjective:    Patient ID: Barbara Dennis, female    DOB: [birth year], 80 y.o.   MRN: [id number]  HPI  This 41 y.o. [place] female is here with her friend, Harriette Bouillon and c/o prod cough and decreased appetite w/o n/v/d. She c/o body aches but denies fever/chills. She continues to smoke.  Pt has persistent mass on R side of neck; pt states no change and denies sore throat or problems swallowing. CT of neck in Feb 2015 was abnormal; pt declined ENT referral at that time. I discussed this with pt's friend, Harriette Bouillon; she agrees to take pt to ENT appt if referral is done. Pt has lost 10 lbs since Feb 2015.  Patient Active Problem List   Diagnosis Date Noted  . Swelling, mass, or lump in head and neck 03/31/2014  . 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 HTN, goal below 150/90 06/17/2013  . DJD (degenerative joint disease) 02/16/2013  . Tobacco user 09/23/2012  . Menopause 08/21/2012  . Postmenopausal atrophic vaginitis 08/21/2012    Prior to Admission medications   Medication Sig Start Date End Date Taking? Authorizing Provider  Acetaminophen (TYLENOL PO) Take by mouth every other day.   Yes Historical Provider, MD  lisinopril (PRINIVIL,ZESTRIL) 5 MG tablet Take 1 tablet (5 mg total) by mouth daily.   Yes Barton Fanny, MD  Multiple Vitamins-Minerals (MULTIVITAMIN WITH MINERALS) tablet Take 1 tablet by mouth daily.   Yes Historical Provider, MD  Naproxen Sodium (ALEVE PO) Take by mouth as needed.   Yes Historical Provider, MD  aspirin EC 81 MG tablet Take 81 mg by mouth daily.    Historical Provider, MD    History   Social History  . Marital Status: Widowed    Spouse Name: N/A    Number of Children: N/A  . Years of Education: N/A   Occupational History  . Not on file.   Social History Main Topics  . Smoking status: Current Every Day Smoker -- 0.70 packs/day for 60 years    Types: Cigarettes  . Smokeless tobacco: Not on file  . Alcohol Use: No  . Drug Use: No  . Sexual Activity:  Not on file   Other Topics Concern  . Not on file   Social History Narrative   Marital status: widowed 28 years ago.  From [place]; moved to [place] 1974.     Children: none       Lives:  Alone; brother in law is Psychologist, sport and exercise who is 91.      Employment: retired.       Tobacco:  1/2 ppd       Alcohol:  None      Exercise:  Walking daily.      ADLs: no driving.    Review of Systems  Constitutional: Positive for appetite change and unexpected weight change. Negative for fever, chills and fatigue.  HENT: Positive for dental problem, postnasal drip and sore throat. Negative for mouth sores, sinus pressure, trouble swallowing and voice change.   Respiratory: Positive for cough and shortness of breath. Negative for choking, chest tightness and wheezing.   Cardiovascular: Negative.   Gastrointestinal: Negative.   Musculoskeletal: Negative for myalgias and neck pain.  Skin:       Chest lesion- small seb cyst R anterior chest, present for months; pt concerned that "it might be cancer" so she wants it removed.  Neurological: Negative.   Psychiatric/Behavioral: Positive for confusion and sleep disturbance. Negative for agitation. The patient is nervous/anxious.  Objective:   Physical Exam  Constitutional: She is oriented to person, place, and time. Vital signs are normal. She appears well-developed and well-nourished. No distress.  Weight down > 10 lbs since March 2015.  HENT:  Head: Normocephalic and atraumatic.  Right Ear: Hearing, tympanic membrane, external ear and ear canal normal.  Left Ear: Hearing, tympanic membrane, external ear and ear canal normal.  Nose: Nose normal. No nasal deformity or septal deviation.  Mouth/Throat: Uvula is midline and mucous membranes are normal. Abnormal dentition. Dental caries present. No uvula swelling. Posterior oropharyngeal erythema present. No posterior oropharyngeal edema.  R tonsillar fossa appears to have fullness compared to L fossa.  Eyes:  Conjunctivae and EOM are normal. Pupils are equal, round, and reactive to light. No scleral icterus.  Neck: Trachea normal, full passive range of motion without pain and phonation normal. Neck supple. No thyroid mass and no thyromegaly present.  R anterior cervical area close to angle of jaw- 2.5- 3 cm form NT mass.  Cardiovascular: Normal rate and regular rhythm.   Pulmonary/Chest: Effort normal and breath sounds normal. No respiratory distress. She has no wheezes. She has no rales.  Distant BS.  Musculoskeletal: Normal range of motion.  Neurological: She is alert and oriented to person, place, and time. No cranial nerve deficit. Coordination normal.  Skin: Skin is warm and dry. No rash noted. She is not diaphoretic. No erythema. No pallor.  Psychiatric: Her speech is normal. Thought content normal. Her mood appears anxious. Her affect is not labile and not inappropriate. She is not agitated, not aggressive, not slowed and not withdrawn. Cognition and memory are impaired.  Slightly impaired cognitive function due to inattentiveness. She is inattentive.  Nursing note and vitals reviewed.   [facility] reading (PRIMARY) by  Dr. Leward Quan:  CXR- Heart size within normal limits. Mediastinum normal and lung fields clear. No effusions, masses, infiltrates or suspicious nodules.     Assessment & Plan:  Tobacco user - Pt continues to smoke and has been unsuccessful at attempts of cessation.  Plan: DG Chest 2 View, Ambulatory referral to ENT  Cough - Suspect bronchitis due to chronic tobacco use. Plan: DG Chest 2 View  Acute bronchitis, unspecified organism- RX: Levaquin 500 mg 1 tab daily for 7 days.  Loss of weight - Pt finally agrees to see ENT about mass on R lower jaw area; her friend, Harriette Bouillon, who has accompanied her to recent visits, will provide transportation. Appointment details should be called to this person; her English language understanding and usage is good. Plan: DG Chest 2 View, Ambulatory  referral to ENT  Swelling, mass, or lump in head and neck - As above. Plan: Ambulatory referral to ENT  Essential hypertension, benign - Stable on current medication. Plan: lisinopril (PRINIVIL,ZESTRIL) 5 MG tablet

## 2014-10-04 NOTE — Telephone Encounter (Signed)
Patient declined  

## 2014-10-04 NOTE — Patient Instructions (Signed)
I am referring you to ENT to evaluate the mass on the right side of your neck. A staff person will call you with the details of the appointment which will probably be after October 21, 2014.  Please try to stop smoking!!!  Your medications have been sent to your pharmacy.

## 2014-10-05 ENCOUNTER — Encounter: Payer: Self-pay | Admitting: Family Medicine

## 2014-10-24 ENCOUNTER — Other Ambulatory Visit (HOSPITAL_COMMUNITY)
Admission: RE | Admit: 2014-10-24 | Discharge: 2014-10-24 | Disposition: A | Discharge status: Home / Self Care | Payer: Medicare Other | Source: Ambulatory Visit | Attending: Otolaryngology | Admitting: Otolaryngology

## 2014-10-24 DIAGNOSIS — K119 Disease of salivary gland, unspecified: Secondary | ICD-10-CM | POA: Insufficient documentation

## 2014-10-24 DIAGNOSIS — R221 Localized swelling, mass and lump, neck: Secondary | ICD-10-CM | POA: Diagnosis not present

## 2014-10-25 ENCOUNTER — Other Ambulatory Visit: Payer: Self-pay | Admitting: Otolaryngology

## 2014-10-25 DIAGNOSIS — K119 Disease of salivary gland, unspecified: Secondary | ICD-10-CM | POA: Diagnosis not present

## 2014-11-05 ENCOUNTER — Ambulatory Visit (INDEPENDENT_AMBULATORY_CARE_PROVIDER_SITE_OTHER): Payer: Medicare Other | Admitting: Family Medicine

## 2014-11-05 VITALS — BP 132/60 | HR 79 | Temp 98.1°F | Resp 24 | Ht <= 58 in | Wt 100.4 lb

## 2014-11-05 DIAGNOSIS — L723 Sebaceous cyst: Secondary | ICD-10-CM | POA: Diagnosis not present

## 2014-11-05 NOTE — Progress Notes (Signed)
This chart was scribed for Barbara Cheadle, MD by Barbara Dennis, ED Scribe. This patient was seen in room 4 and the patient's care was started at 2:52 PM.  Subjective:    Patient ID: Barbara Dennis, female    DOB: Aug 30, 1934, 81 y.o.   MRN: [id number]  Chief Complaint  Patient presents with  . Nevus    On Right Chest-Wants Removed    HPI FUMI Barbara Dennis is a 81 y.o. female with a PMhx of HTN.   Pt is complaining of a nevus on the right side of her chest, which she would like to be removed. Daughter states that they first noticed the mole to the skin 5 years ago, which they started to squeeze but nothing came out. So they left it alone. However, 2 years ago the bump started to grow and get uncomfortable. No pain endorsed. Denies any fever, chills, nausea, emesis, abdominal pain, SOB, chest pain, HA, weakness, or  Numbness.   Past Medical History  Diagnosis Date  . Hypertension    Current Outpatient Prescriptions on File Prior to Visit  Medication Sig Dispense Refill  . Acetaminophen (TYLENOL PO) Take by mouth every other day.    Marland Kitchen aspirin EC 81 MG tablet Take 81 mg by mouth daily.    Marland Kitchen lisinopril (PRINIVIL,ZESTRIL) 5 MG tablet Take 1 tablet (5 mg total) by mouth daily. 90 tablet 3  . Multiple Vitamins-Minerals (MULTIVITAMIN WITH MINERALS) tablet Take 1 tablet by mouth daily.    . Naproxen Sodium (ALEVE PO) Take by mouth as needed.     No current facility-administered medications on file prior to visit.   No Known Allergies   Review of Systems  Constitutional: Negative for fatigue.  Respiratory: Negative for chest tightness and shortness of breath.   Cardiovascular: Negative for chest pain, palpitations and leg swelling.  Gastrointestinal: Negative for abdominal pain and blood in stool.  Skin: Negative for rash.       Cyst to right chest  Neurological: Negative for dizziness, syncope, light-headedness and headaches.    Triage Vitals: BP 132/60 mmHg  Pulse 79  Temp(Src) 98.1 F  (36.7 C) (Oral)  Resp 24  Ht 4\' 10"  (1.473 m)  Wt 100 lb 6 oz (45.53 kg)  BMI 20.98 kg/m2  SpO2 98%  Objective:   Physical Exam  Constitutional: She is oriented to person, place, and time. She appears well-developed and well-nourished. No distress.  HENT:  Head: Normocephalic and atraumatic.  Eyes: Conjunctivae and EOM are normal.  Neck: Neck supple.  Cardiovascular: Normal rate.   Pulmonary/Chest: Effort normal. No respiratory distress.  Musculoskeletal: Normal range of motion.  Neurological: She is alert and oriented to person, place, and time.  Skin: Skin is warm and dry.  Subcutaneous cyst. Not infection or inflammation. Approximately 1 cm in diameter. Indurated and mobile. Small grey pin-point punctuation at the lower edge.   Psychiatric: She has a normal mood and affect. Her behavior is normal.  Nursing note and vitals reviewed.         Assessment & Plan:   Sebaceous cyst Gets irritated easily and increasing in size so recommend removal which was done today by [facility].  Colon cancer screening - done by Dr. Benson Norway 10 yrs ago - pt reminded to call GI to schedule next.  Right neck mass - s/p FNA by Dr. Redmond Baseman - pt has not been called by ENT office with results yet but the biopsy is [facility] - looks like benign parotid  gland but per pathologist may need to biopsy again   I personally performed the services described in this documentation, which was scribed in my presence. The recorded information has been reviewed and considered, and addended by me as needed.  Barbara Cheadle, MD MPH

## 2014-11-05 NOTE — Progress Notes (Signed)
SEBACEOUS CYST EXCISION: Verbal consent obtained. Local anesthesia with 2cc of 2% lido with epi. Sterile prep and drape. Incision using a 15 blade, the cyst was dissected from the surrounding tissue with curved hemostats and scissors. Wound closed with #2 5-0 prolene sutures. Cleansed and dressed.   Julieta Gutting, PA-C Physician Assistant-Certified [facility]  11/05/2014 4:10 PM

## 2014-11-05 NOTE — Patient Instructions (Addendum)
You should call Dr. Benson Norway or Dr. Collene Mares (GI doctors) to reschedule your routine screening colonoscopy to ensure no colon polyps have grown in the next 10 years. Dr. Redmond Baseman should call you soon to discuss the biopsy results from your neck - I think this is likely benign but they may want to do another biopsy to make sure   Epidermal Cyst An epidermal cyst is sometimes called a sebaceous cyst, epidermal inclusion cyst, or infundibular cyst. These cysts usually contain a substance that looks "pasty" or "cheesy" and may have a bad smell. This substance is a protein called keratin. Epidermal cysts are usually found on the face, neck, or trunk. They may also occur in the vaginal area or other parts of the genitalia of both men and women. Epidermal cysts are usually small, painless, slow-growing bumps or lumps that move freely under the skin. It is important not to try to pop them. This may cause an infection and lead to tenderness and swelling. CAUSES  Epidermal cysts may be caused by a deep penetrating injury to the skin or a plugged hair follicle, often associated with acne. SYMPTOMS  Epidermal cysts can become inflamed and cause:  Redness.  Tenderness.  Increased temperature of the skin over the bumps or lumps.  Grayish-white, bad smelling material that drains from the bump or lump. DIAGNOSIS  Epidermal cysts are easily diagnosed by your caregiver during an exam. Rarely, a tissue sample (biopsy) may be taken to rule out other conditions that may resemble epidermal cysts. TREATMENT   Epidermal cysts often get better and disappear on their own. They are rarely ever cancerous.  If a cyst becomes infected, it may become inflamed and tender. This may require opening and draining the cyst. Treatment with antibiotics may be necessary. When the infection is gone, the cyst may be removed with minor surgery.  Small, inflamed cysts can often be treated with antibiotics or by injecting steroid  medicines.  Sometimes, epidermal cysts become large and bothersome. If this happens, surgical removal in your caregiver's office may be necessary. HOME CARE INSTRUCTIONS  Only take over-the-counter or prescription medicines as directed by your caregiver.  Take your antibiotics as directed. Finish them even if you start to feel better. SEEK MEDICAL CARE IF:   Your cyst becomes tender, red, or swollen.  Your condition is not improving or is getting worse.  You have any other questions or concerns. MAKE SURE YOU:  Understand these instructions.  Will watch your condition.  Will get help right away if you are not doing well or get worse. Document Released: 09/07/2004 Document Revised: 12/30/2011 Document Reviewed: 04/15/2011 [facility] Patient Information 2015 [place], [place]. This information is not intended to replace advice given to you by your health care provider. Make sure you discuss any questions you have with your health care provider.   Incision Care An incision is when a surgeon cuts into your body tissues. After surgery, the incision needs to be cared for properly to prevent infection.  HOME CARE INSTRUCTIONS   Take all medicine as directed by your caregiver. Only take over-the-counter or prescription medicines for pain, discomfort, or fever as directed by your caregiver.  Do not remove your bandage (dressing) or get your incision wet until your surgeon gives you permission. In the event that your dressing becomes wet, dirty, or starts to smell, change the dressing and call your surgeon for instructions as soon as possible.  Take showers. Do not take tub baths, swim, or do anything that  may soak the wound until it is healed.  Resume your normal diet and activities as directed or allowed.  Avoid lifting any weight until you are instructed otherwise.  Use anti-itch antihistamine medicine as directed by your caregiver. The wound may itch when it is healing. Do not pick or  scratch at the wound.  Follow up with your caregiver for stitch (suture) or staple removal as directed.  Drink enough fluids to keep your urine clear or pale yellow. SEEK MEDICAL CARE IF:   You have redness, swelling, or increasing pain in the wound that is not controlled with medicine.  You have drainage, blood, or pus coming from the wound that lasts longer than 1 day.  You develop muscle aches, chills, or a general ill feeling.  You notice a bad smell coming from the wound or dressing.  Your wound edges separate after the sutures, staples, or skin adhesive strips have been removed.  You develop persistent nausea or vomiting. SEEK IMMEDIATE MEDICAL CARE IF:   You have a fever.  You develop a rash.  You develop dizzy episodes or faint while standing.  You have difficulty breathing.  You develop any reaction or side effects to medicine given. MAKE SURE YOU:   Understand these instructions.  Will watch your condition.  Will get help right away if you are not doing well or get worse. Document Released: 04/26/2005 Document Revised: 12/30/2011 Document Reviewed: 12/01/2013 [facility] Patient Information 2015 [place], [place]. This information is not intended to replace advice given to you by your health care provider. Make sure you discuss any questions you have with your health care provider.

## 2014-11-14 ENCOUNTER — Ambulatory Visit (INDEPENDENT_AMBULATORY_CARE_PROVIDER_SITE_OTHER): Payer: Medicare Other | Admitting: Emergency Medicine

## 2014-11-14 VITALS — BP 122/74 | HR 88 | Temp 98.4°F | Resp 17 | Ht <= 58 in | Wt 102.0 lb

## 2014-11-14 DIAGNOSIS — Z4889 Encounter for other specified surgical aftercare: Secondary | ICD-10-CM

## 2014-11-14 NOTE — Progress Notes (Signed)
[facility] [address] [place] [address] [phone]  Date:  11/14/2014   Name:  Barbara Dennis   DOB:  Sep 04, 1934   MRN:  [id number]  PCP:  Ellsworth Lennox, MD    Chief Complaint: Follow-up   History of Present Illness:  Barbara Dennis is a 81 y.o. very pleasant female patient who presents with the following:  For suture removal   Patient Active Problem List   Diagnosis Date Noted  . Swelling, mass, or lump in head and neck 03/31/2014  . 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 HTN, goal below 150/90 06/17/2013  . DJD (degenerative joint disease) 02/16/2013  . Tobacco user 09/23/2012  . Menopause 08/21/2012  . Postmenopausal atrophic vaginitis 08/21/2012    Past Medical History  Diagnosis Date  . Hypertension     Past Surgical History  Procedure Laterality Date  . Appendectomy      History  Substance Use Topics  . Smoking status: Current Every Day Smoker -- 0.70 packs/day for 60 years    Types: Cigarettes  . Smokeless tobacco: Never Used  . Alcohol Use: No    No family history on file.  No Known Allergies  Medication list has been reviewed and updated.  Current Outpatient Prescriptions on File Prior to Visit  Medication Sig Dispense Refill  . Acetaminophen (TYLENOL PO) Take by mouth every other day.    Marland Kitchen aspirin EC 81 MG tablet Take 81 mg by mouth daily.    Marland Kitchen lisinopril (PRINIVIL,ZESTRIL) 5 MG tablet Take 1 tablet (5 mg total) by mouth daily. 90 tablet 3  . Multiple Vitamins-Minerals (MULTIVITAMIN WITH MINERALS) tablet Take 1 tablet by mouth daily.    . Naproxen Sodium (ALEVE PO) Take by mouth as needed.     No current facility-administered medications on file prior to visit.    Review of Systems:  As per HPI, otherwise negative.    Physical Examination: Filed Vitals:   11/14/14 1424  BP: 122/74  Pulse: 88  Temp: 98.4 F (36.9 C)  Resp: 17   Filed Vitals:   11/14/14 1424  Height: 4\' 10"  (1.473 m)   Weight: 102 lb (46.267 kg)   Body mass index is 21.32 kg/(m^2). Ideal Body Weight: Weight in (lb) to have BMI = 25: 119.4   GEN: WDWN, NAD, Non-toxic, Alert & Oriented x 3 HEENT: Atraumatic, Normocephalic.  Ears and Nose: No external deformity. EXTR: No clubbing/cyanosis/edema NEURO: Normal gait.  PSYCH: Normally interactive. Conversant. Not depressed or anxious appearing.  Calm demeanor.  Wound healed   Assessment and Plan: Sutures removed  Signed,  Ellison Carwin, MD

## 2015-01-03 ENCOUNTER — Encounter: Payer: Self-pay | Admitting: Family Medicine

## 2015-01-03 ENCOUNTER — Ambulatory Visit (INDEPENDENT_AMBULATORY_CARE_PROVIDER_SITE_OTHER): Payer: Medicare Other | Admitting: Family Medicine

## 2015-01-03 VITALS — BP 110/60 | HR 85 | Temp 98.2°F | Resp 16 | Ht 58.25 in | Wt 99.6 lb

## 2015-01-03 DIAGNOSIS — R634 Abnormal weight loss: Secondary | ICD-10-CM

## 2015-01-03 DIAGNOSIS — F418 Other specified anxiety disorders: Secondary | ICD-10-CM | POA: Diagnosis not present

## 2015-01-03 DIAGNOSIS — F419 Anxiety disorder, unspecified: Secondary | ICD-10-CM

## 2015-01-03 DIAGNOSIS — M791 Myalgia, unspecified site: Secondary | ICD-10-CM

## 2015-01-03 DIAGNOSIS — F5105 Insomnia due to other mental disorder: Secondary | ICD-10-CM | POA: Diagnosis not present

## 2015-01-03 DIAGNOSIS — E559 Vitamin D deficiency, unspecified: Secondary | ICD-10-CM | POA: Diagnosis not present

## 2015-01-03 DIAGNOSIS — R700 Elevated erythrocyte sedimentation rate: Secondary | ICD-10-CM

## 2015-01-03 LAB — BASIC METABOLIC PANEL
BUN: 19 mg/dL (ref 6–23)
CALCIUM: 9.4 mg/dL (ref 8.4–10.5)
CHLORIDE: 102 meq/L (ref 96–112)
CO2: 23 meq/L (ref 19–32)
Creat: 0.94 mg/dL (ref 0.50–1.10)
Glucose, Bld: 84 mg/dL (ref 70–99)
Potassium: 4.8 mEq/L (ref 3.5–5.3)
Sodium: 135 mEq/L (ref 135–145)

## 2015-01-03 LAB — THYROID PANEL WITH TSH
Free Thyroxine Index: 2.1 (ref 1.4–3.8)
T3 Uptake: 26 % (ref 22–35)
T4, Total: 8.1 ug/dL (ref 4.5–12.0)
TSH: 1.452 u[IU]/mL (ref 0.350–4.500)

## 2015-01-03 MED ORDER — MIRTAZAPINE 7.5 MG PO TABS: 7.5000 mg | ORAL_TABLET | Freq: Every day | ORAL | Status: DC

## 2015-01-04 LAB — VITAMIN D 25 HYDROXY (VIT D DEFICIENCY, FRACTURES): Vit D, 25-Hydroxy: 17 ng/mL — ABNORMAL LOW (ref 30–100)

## 2015-01-04 LAB — SEDIMENTATION RATE: SED RATE: 38 mm/h — AB (ref 0–30)

## 2015-01-05 ENCOUNTER — Other Ambulatory Visit: Payer: Self-pay | Admitting: Family Medicine

## 2015-01-05 MED ORDER — ERGOCALCIFEROL 1.25 MG (50000 UT) PO CAPS: 50000.0000 [IU] | ORAL_CAPSULE | ORAL | Status: DC

## 2015-01-05 NOTE — Progress Notes (Signed)
Quick Note:  Please advise pt regarding following labs...  Vitamin D level is very low. I have prescribed a supplement for you to take once a week to increase Vitamin D level. This medication is at your pharmacy for pick-up. Take 1 capsule once a week.'  Contact the clinic if you have questions.  Copy to pt. ______

## 2015-01-06 ENCOUNTER — Encounter: Payer: Self-pay | Admitting: Family Medicine

## 2015-01-06 ENCOUNTER — Telehealth: Payer: Self-pay

## 2015-01-06 DIAGNOSIS — F418 Other specified anxiety disorders: Secondary | ICD-10-CM | POA: Insufficient documentation

## 2015-01-06 NOTE — Progress Notes (Signed)
Subjective:    Patient ID: Barbara Dennis, female    DOB: [birth year], 81 y.o.   MRN: [id number]  HPI  This 81 y.o. Female is here for follow-up; she has chronic insomnia due to anxiety. Medication prescribed in the past for this condition include:  Bupropion (to help w/ tobacco cessation) and clorazepate (pt has not been taking this med). She has voiced anxiety in the past about getting older and living alone. Appetite is fair; pt consumes average of 2 small meals daily with minimal snacking. Recent ENT eval (Dr. Melida Quitter) for R sided neck mass >> FNA on 10/24/2014; pt states she was told that "everything is okay" and no further ENT appt scheduled.   Pt c/o muscle soreness, especially in her arms. She denies fever/chills, difficulty swallowing, mouth sores or sore throat but has significant dental issues. Review of previous labs reveals Sed rate = 38 in Feb 2015. She denies HA, dizziness or weakness. She grinds her teeth constantly.  Pt continues to smoke but less than 1/2 ppd. She has NP cough but denies SOB, DOE or wheezing.    Patient Active Problem List   Diagnosis Date Noted  . Swelling, mass, or lump in head and neck 03/31/2014  . 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 HTN, goal below 150/90 06/17/2013  . DJD (degenerative joint disease) 02/16/2013  . Tobacco user 09/23/2012  . Menopause 08/21/2012  . Postmenopausal atrophic vaginitis 08/21/2012    Prior to Admission medications   Medication Sig Start Date End Date Taking? Authorizing Provider  Acetaminophen (TYLENOL PO) Take by mouth every other day.   Yes Historical Provider, MD  lisinopril (PRINIVIL,ZESTRIL) 5 MG tablet Take 1 tablet (5 mg total) by mouth daily. 10/04/14  Yes Barton Fanny, MD  aspirin EC 81 MG tablet Take 81 mg by mouth daily.    Historical Provider, MD  Multiple Vitamins-Minerals (MULTIVITAMIN WITH MINERALS) tablet Take 1 tablet by mouth daily.    Historical Provider, MD  Naproxen Sodium (ALEVE  PO) Take by mouth as needed.    Historical Provider, MD    History   Social History  . Marital Status: Widowed    Spouse Name: N/A  . Number of Children: N/A  . Years of Education: N/A   Occupational History  . Not on file.   Social History Main Topics  . Smoking status: Current Every Day Smoker -- 0.70 packs/day for 60 years    Types: Cigarettes  . Smokeless tobacco: Never Used  . Alcohol Use: No  . Drug Use: No  . Sexual Activity: Not on file   Other Topics Concern  . Not on file   Social History Narrative   Marital status: widowed 28 years ago.  From [place]; moved to [place] 1974.     Children: none       Lives:  Alone; brother in law is Psychologist, sport and exercise who is 23.      Employment: retired.       Tobacco:  1/2 ppd       Alcohol:  None      Exercise:  Walking daily.      ADLs: no driving.    Review of Systems  As per HPI.     Objective:   Physical Exam  Constitutional: She is oriented to person, place, and time. Vital signs are normal. She appears well-developed and well-nourished. She has a sickly appearance. No distress.  Blood pressure 110/60, pulse 85, temperature 98.2 F (36.8 C), temperature  source Oral, resp. rate 16, height 4' 10.25" (1.48 m), weight 99 lb 9.6 oz (45.178 kg), SpO2 97 %.   Weight down from 102 lbs in Jan 2016.   HENT:  Head: Normocephalic and atraumatic.  Right Ear: Hearing and external ear normal.  Left Ear: Hearing and external ear normal.  Nose: Nose normal.  Mouth/Throat: Mucous membranes are normal. Abnormal dentition. Posterior oropharyngeal erythema present. No oropharyngeal exudate.  Eyes: Conjunctivae and EOM are normal. No scleral icterus.  Cardiovascular: Normal rate and regular rhythm.   Pulmonary/Chest: Effort normal. No respiratory distress.  Musculoskeletal: Normal range of motion. She exhibits no edema or tenderness.  Neurological: She is alert and oriented to person, place, and time. No cranial nerve deficit. She exhibits normal  muscle tone. Coordination normal.  Skin: Skin is warm and dry. No rash noted. She is not diaphoretic. No erythema. No pallor.  Psychiatric: Thought content normal. Her mood appears anxious. Her affect is not angry, not labile and not inappropriate. Her speech is not tangential and not slurred. She is slowed. She is not agitated and not withdrawn. Cognition and memory are not impaired.  Flat affect.  PHQ-9 score= 14. She is attentive.  Nursing note and vitals reviewed.      Assessment & Plan:  Insomnia secondary to anxiety - Plan: Thyroid Panel With TSH  Loss of weight - Encouraged better nutrition; pt is familiar w/ liquid meal replacement Ensure. She is encouraged to have 1 bottle daily but also eat 2 meals daily. She understands that cigarette use blunts appetite. Plan: Basic metabolic panel, Thyroid Panel With TSH  Muscle soreness - None found on physical exam. Will recheck Sed rate. Plan: Vitamin D, 25-hydroxy, Sedimentation Rate  Elevated sed rate - As above.  Plan: Sedimentation Rate  Depression with anxiety- Trial Remeron 7.5 mg 1 tablet at bedtime; recheck in 4 weeks.   Meds ordered this encounter  Medications  . mirtazapine (REMERON) 7.5 MG tablet    Sig: Take 1 tablet (7.5 mg total) by mouth at bedtime.    Dispense:  30 tablet    Refill:  3

## 2015-01-06 NOTE — Telephone Encounter (Signed)
Patient cannot sleep can she takes two pills.  She will wait for the doctor to call her back.  Speaks [place].   [phone] (H)

## 2015-01-06 NOTE — Telephone Encounter (Signed)
I spoke with pt and advised that she take 2 tablets (mirtazapine 7.5 mg) at bedtime. She should expect some improvement w/ sleep in the next few days. If this increased dose works, she will contact me so that I can adjust the dose and quantity. She has follow-up appt in early April.

## 2015-01-06 NOTE — Telephone Encounter (Signed)
Dr Leward Quan-- Pt states she can't sleep with only one pill. Can she double up or does she need to try something else?

## 2015-01-31 ENCOUNTER — Encounter: Payer: Self-pay | Admitting: Family Medicine

## 2015-01-31 ENCOUNTER — Ambulatory Visit (INDEPENDENT_AMBULATORY_CARE_PROVIDER_SITE_OTHER): Payer: Medicare Other | Admitting: Family Medicine

## 2015-01-31 VITALS — BP 110/56 | HR 90 | Temp 98.3°F | Resp 16 | Ht <= 58 in | Wt 101.0 lb

## 2015-01-31 DIAGNOSIS — I10 Essential (primary) hypertension: Secondary | ICD-10-CM | POA: Diagnosis not present

## 2015-01-31 DIAGNOSIS — F419 Anxiety disorder, unspecified: Secondary | ICD-10-CM | POA: Diagnosis not present

## 2015-01-31 DIAGNOSIS — E559 Vitamin D deficiency, unspecified: Secondary | ICD-10-CM | POA: Diagnosis not present

## 2015-01-31 DIAGNOSIS — F5105 Insomnia due to other mental disorder: Secondary | ICD-10-CM | POA: Diagnosis not present

## 2015-01-31 NOTE — Patient Instructions (Addendum)
Insomnia Insomnia is frequent trouble falling and/or staying asleep. Insomnia can be a long term problem or a short term problem. Both are common. Insomnia can be a short term problem when the wakefulness is related to a certain stress or worry. Long term insomnia is often related to ongoing stress during waking hours and/or poor sleeping habits. Overtime, sleep deprivation itself can make the problem worse. Every little thing feels more severe because you are overtired and your ability to cope is decreased. CAUSES   Stress, anxiety, and depression.  Poor sleeping habits.  Distractions such as TV in the bedroom.  Naps close to bedtime.  Engaging in emotionally charged conversations before bed.  Technical reading before sleep.  Alcohol and other sedatives. They may make the problem worse. They can hurt normal sleep patterns and normal dream activity.  Stimulants such as caffeine for several hours prior to bedtime.  Pain syndromes and shortness of breath can cause insomnia.  Exercise late at night.  Changing time zones may cause sleeping problems (jet lag). It is sometimes helpful to have someone observe your sleeping patterns. They should look for periods of not breathing during the night (sleep apnea). They should also look to see how long those periods last. If you live alone or observers are uncertain, you can also be observed at a sleep clinic where your sleep patterns will be professionally monitored. Sleep apnea requires a checkup and treatment. Give your caregivers your medical history. Give your caregivers observations your family has made about your sleep.  SYMPTOMS   Not feeling rested in the morning.  Anxiety and restlessness at bedtime.  Difficulty falling and staying asleep. TREATMENT   Your caregiver may prescribe treatment for an underlying medical disorders. Your caregiver can give advice or help if you are using alcohol or other drugs for self-medication. Treatment  of underlying problems will usually eliminate insomnia problems.  Medications can be prescribed for short time use. They are generally not recommended for lengthy use.  Over-the-counter sleep medicines are not recommended for lengthy use. They can be habit forming.  You can promote easier sleeping by making lifestyle changes such as:  Using relaxation techniques that help with breathing and reduce muscle tension.  Exercising earlier in the day.  Changing your diet and the time of your last meal. No night time snacks.  Establish a regular time to go to bed.  Counseling can help with stressful problems and worry.  Soothing music and white noise may be helpful if there are background noises you cannot remove.  Stop tedious detailed work at least one hour before bedtime. HOME CARE INSTRUCTIONS   Keep a diary. Inform your caregiver about your progress. This includes any medication side effects. See your caregiver regularly. Take note of:  Times when you are asleep.  Times when you are awake during the night.  The quality of your sleep.  How you feel the next day. This information will help your caregiver care for you.  Get out of bed if you are still awake after 15 minutes. Read or do some quiet activity. Keep the lights down. Wait until you feel sleepy and go back to bed.  Keep regular sleeping and waking hours. Avoid naps.  Exercise regularly.  Avoid distractions at bedtime. Distractions include watching television or engaging in any intense or detailed activity like attempting to balance the household checkbook.  Develop a bedtime ritual. Keep a familiar routine of bathing, brushing your teeth, climbing into bed at the same   time each night, listening to soothing music. Routines increase the success of falling to sleep faster.  Use relaxation techniques. This can be using breathing and muscle tension release routines. It can also include visualizing peaceful scenes. You can  also help control troubling or intruding thoughts by keeping your mind occupied with boring or repetitive thoughts like the old concept of counting sheep. You can make it more creative like imagining planting one beautiful flower after another in your backyard garden.  During your day, work to eliminate stress. When this is not possible use some of the previous suggestions to help reduce the anxiety that accompanies stressful situations. MAKE SURE YOU:   Understand these instructions.  Will watch your condition.  Will get help right away if you are not doing well or get worse. Document Released: 10/04/2000 Document Revised: 12/30/2011 Document Reviewed: 11/04/2007 [facility] Patient Information 2015 [place], [place]. This information is not intended to replace advice given to you by your health care provider. Make sure you discuss any questions you have with your health care provider.    There is a free phone service: 1-800-QUIT-NOW. They can give you advice about how to stop smoking.

## 2015-02-01 NOTE — Progress Notes (Signed)
S:  This 81 y.o female is here with her friend, Harriette Bouillon. Pt has well -controlled HTN and takes medication as prescribed. Weight loss has resolved; pt has increased appetite though food choices are not varied. She continues to smoke daily and has insomnia. She takes medication prescribed (mirtazapine 7.5 mg  2 tabs at bedtime) but still has problem w/ sleep onset. She watches TV in her bed when she cannot sleep. She falls asleep at 1-2 AM then sleeps late the next morning. Her friend, Harriette Bouillon, advises against prescribing different medications because she states pt will not take medication.   Patient Active Problem List   Diagnosis Date Noted  . Depression with anxiety 01/06/2015  . Swelling, mass, or lump in head and neck 03/31/2014  . 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 HTN, goal below 150/90 06/17/2013  . DJD (degenerative joint disease) 02/16/2013  . Tobacco user 09/23/2012  . Menopause 08/21/2012  . Postmenopausal atrophic vaginitis 08/21/2012    Prior to Admission medications   Medication Sig Start Date End Date Taking? Authorizing Provider  Acetaminophen (TYLENOL PO) Take by mouth every other day.   Yes Historical Provider, MD  aspirin EC 81 MG tablet Take 81 mg by mouth daily.   Yes Historical Provider, MD  ergocalciferol (DRISDOL) 50000 UNITS capsule Take 1 capsule (50,000 Units total) by mouth once a week. 01/05/15 01/05/16 Yes Barton Fanny, MD  lisinopril (PRINIVIL,ZESTRIL) 5 MG tablet Take 1 tablet (5 mg total) by mouth daily. 10/04/14  Yes Barton Fanny, MD  mirtazapine (REMERON) 7.5 MG tablet Take 1 tablet (7.5 mg total) by mouth at bedtime. 01/03/15  Yes Barton Fanny, MD  Multiple Vitamins-Minerals (MULTIVITAMIN WITH MINERALS) tablet Take 1 tablet by mouth daily.   Yes Historical Provider, MD  Naproxen Sodium (ALEVE PO) Take by mouth as needed.   Yes Historical Provider, MD    SURG, SOC and FAM HX reviewed.  ROS: AS per HPI.  O: Filed Vitals:   01/31/15  1353  BP: 110/56  Pulse: 90  Temp: 98.3 F (36.8 C)  Resp: 16    GEN: In NAD; WN,WD. Weight up ~ 1.5 lbs. HENT: [place]/AT; otherwise unchanged. COR: RRR. LUNGS: Unlabored resp. SKIN: W&D; no erythema, jaundice or pallor. NEURO: A&O x 3; CNs intact. Nonfocal.  Results for orders placed or performed in visit on 30/09/23  Basic metabolic panel  Result Value Ref Range   Sodium 135 135 - 145 mEq/L   Potassium 4.8 3.5 - 5.3 mEq/L   Chloride 102 96 - 112 mEq/L   CO2 23 19 - 32 mEq/L   Glucose, Bld 84 70 - 99 mg/dL   BUN 19 6 - 23 mg/dL   Creat 0.94 0.50 - 1.10 mg/dL   Calcium 9.4 8.4 - 10.5 mg/dL  Vitamin D, 25-hydroxy  Result Value Ref Range   Vit D, 25-Hydroxy 17 (L) 30 - 100 ng/mL  Sedimentation Rate  Result Value Ref Range   Sed Rate 38 (H) 0 - 30 mm/hr  Thyroid Panel With TSH  Result Value Ref Range   T4, Total 8.1 4.5 - 12.0 ug/dL   T3 Uptake 26 22 - 35 %   Free Thyroxine Index 2.1 1.4 - 3.8   TSH 1.452 0.350 - 4.500 uIU/mL    A/P: HTN, goal below 150/90- Stable on current medication- lisinopril 5 mg 1 tab daily.  Insomnia secondary to anxiety- Take mirtazapine 15 mg at 8-9 PM; do not watch TV in bed when having  trouble sleeping.  Vitamin D deficiency- Take 50000 unit capsule once a week until no refills remain.

## 2015-03-29 ENCOUNTER — Ambulatory Visit (INDEPENDENT_AMBULATORY_CARE_PROVIDER_SITE_OTHER): Payer: Medicare Other | Admitting: Internal Medicine

## 2015-03-29 VITALS — BP 170/66 | HR 71 | Temp 97.6°F | Resp 18 | Ht <= 58 in | Wt 101.2 lb

## 2015-03-29 DIAGNOSIS — R221 Localized swelling, mass and lump, neck: Secondary | ICD-10-CM | POA: Diagnosis not present

## 2015-03-29 DIAGNOSIS — F809 Developmental disorder of speech and language, unspecified: Secondary | ICD-10-CM

## 2015-03-29 DIAGNOSIS — Z720 Tobacco use: Secondary | ICD-10-CM

## 2015-03-29 DIAGNOSIS — K068 Other specified disorders of gingiva and edentulous alveolar ridge: Secondary | ICD-10-CM | POA: Diagnosis not present

## 2015-03-29 DIAGNOSIS — F17200 Nicotine dependence, unspecified, uncomplicated: Secondary | ICD-10-CM

## 2015-03-29 MED ORDER — AMOXICILLIN 500 MG PO CAPS: 1000.0000 mg | ORAL_CAPSULE | Freq: Two times a day (BID) | ORAL | Status: DC

## 2015-03-29 MED ORDER — HYDROCODONE-ACETAMINOPHEN 5-325 MG PO TABS: ORAL_TABLET | ORAL | Status: DC

## 2015-03-29 NOTE — Progress Notes (Signed)
   Subjective:    Patient ID: Barbara Dennis, female    DOB: February 01, 1934, 81 y.o.   MRN: [id number]  HPI This chart was scribed by Benetta Spar CMA for Dr. Elder Cyphers.  Patient is here today with jaw pain and swelling on the right side. Area is very tender to touch. Pain has started to spread back to her right ear. She had same problem last year and saw ENT Dr. Redmond Baseman biopsy shows minimal cytologic atypia on parotid fine needle bx. She has continued to smoke, she now co right upper gum pain and mass right submandibular which is painless. She is [place] and speaks broken [place].    Review of Systems     Objective:   Physical Exam  Constitutional: She is oriented to person, place, and time. She appears well-developed and well-nourished. No distress.  HENT:  Head:    Right Ear: External ear normal.  Left Ear: External ear normal.  Nose: Nose normal.  Mouth/Throat: Uvula is midline and oropharynx is clear and moist. She has dentures. Oral lesions present. Abnormal dentition.    Fixed enlarged node or mass  Upper gums have no teeth, area marked is painful to palpation, no infection seen  Eyes: Conjunctivae and EOM are normal. Pupils are equal, round, and reactive to light.  Neck: Normal range of motion.  Pulmonary/Chest: Effort normal.  Lymphadenopathy:    She has cervical adenopathy.  Neurological: She is alert and oriented to person, place, and time.  Psychiatric: She has a normal mood and affect. Her behavior is normal. Thought content normal.  Vitals reviewed.         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 Redmond Baseman this week. Amoxil/vicodin low dose

## 2015-03-29 NOTE — Patient Instructions (Addendum)
Smoking Cessation Quitting smoking is important to your health and has many advantages. However, it is not always easy to quit since nicotine is a very addictive drug. Oftentimes, people try 3 times or more before being able to quit. This document explains the best ways for you to prepare to quit smoking. Quitting takes hard work and a lot of effort, but you can do it. ADVANTAGES OF QUITTING SMOKING  You will live longer, feel better, and live better.  Your body will feel the impact of quitting smoking almost immediately.  Within 20 minutes, blood pressure decreases. Your pulse returns to its normal level.  After 8 hours, carbon monoxide levels in the blood return to normal. Your oxygen level increases.  After 24 hours, the chance of having a heart attack starts to decrease. Your breath, hair, and body stop smelling like smoke.  After 48 hours, damaged nerve endings begin to recover. Your sense of taste and smell improve.  After 72 hours, the body is virtually free of nicotine. Your bronchial tubes relax and breathing becomes easier.  After 2 to 12 weeks, lungs can hold more air. Exercise becomes easier and circulation improves.  The risk of having a heart attack, stroke, cancer, or lung disease is greatly reduced.  After 1 year, the risk of coronary heart disease is cut in half.  After 5 years, the risk of stroke falls to the same as a nonsmoker.  After 10 years, the risk of lung cancer is cut in half and the risk of other cancers decreases significantly.  After 15 years, the risk of coronary heart disease drops, usually to the level of a nonsmoker.  If you are pregnant, quitting smoking will improve your chances of having a healthy baby.  The people you live with, especially any children, will be healthier.  You will have extra money to spend on things other than cigarettes. QUESTIONS TO THINK ABOUT BEFORE ATTEMPTING TO QUIT You may want to talk about your answers with your  health care provider.  Why do you want to quit?  If you tried to quit in the past, what helped and what did not?  What will be the most difficult situations for you after you quit? How will you plan to handle them?  Who can help you through the tough times? Your family? Friends? A health care provider?  What pleasures do you get from smoking? What ways can you still get pleasure if you quit? Here are some questions to ask your health care provider:  How can you help me to be successful at quitting?  What medicine do you think would be best for me and how should I take it?  What should I do if I need more help?  What is smoking withdrawal like? How can I get information on withdrawal? GET READY  Set a quit date.  Change your environment by getting rid of all cigarettes, ashtrays, matches, and lighters in your home, car, or work. Do not let people smoke in your home.  Review your past attempts to quit. Think about what worked and what did not. GET SUPPORT AND ENCOURAGEMENT You have a better chance of being successful if you have help. You can get support in many ways.  Tell your family, friends, and coworkers that you are going to quit and need their support. Ask them not to smoke around you.  Get individual, group, or telephone counseling and support. Programs are available at local hospitals and health centers. Call   your local health department for information about programs in your area.  Spiritual beliefs and practices may help some smokers quit.  Download a "quit meter" on your computer to keep track of quit statistics, such as how long you have gone without smoking, cigarettes not smoked, and money saved.  Get a self-help book about quitting smoking and staying off tobacco. [place] yourself from urges to smoke. Talk to someone, go for a walk, or occupy your time with a task.  Change your normal routine. Take a different route to work.  Drink tea instead of coffee. Eat breakfast in a different place.  Reduce your stress. Take a hot bath, exercise, or read a book.  Plan something enjoyable to do every day. Reward yourself for not smoking.  Explore interactive web-based programs that specialize in helping you quit. GET MEDICINE AND USE IT CORRECTLY Medicines can help you stop smoking and decrease the urge to smoke. Combining medicine with the above behavioral methods and support can greatly increase your chances of successfully quitting smoking.  Nicotine replacement therapy helps deliver nicotine to your body without the negative effects and risks of smoking. Nicotine replacement therapy includes nicotine gum, lozenges, inhalers, nasal sprays, and skin patches. Some may be available over-the-counter and others require a prescription.  Antidepressant medicine helps people abstain from smoking, but how this works is unknown. This medicine is available by prescription.  Nicotinic receptor partial agonist medicine simulates the effect of nicotine in your brain. This medicine is available by prescription. Ask your health care provider for advice about which medicines to use and how to use them based on your health history. Your health care provider will tell you what side effects to look out for if you choose to be on a medicine or therapy. Carefully read the information on the package. Do not use any other product containing nicotine while using a nicotine replacement product.  RELAPSE OR DIFFICULT SITUATIONS Most relapses occur within the first 3 months after quitting. Do not be discouraged if you start smoking again. Remember, most people try several times before finally quitting. You may have symptoms of withdrawal because your body is used to nicotine. You may crave cigarettes, be irritable, feel very hungry, cough often, get headaches, or have difficulty concentrating. The withdrawal symptoms are only temporary. They are strongest  when you first quit, but they will go away within 10-14 days. To reduce the chances of relapse, try to:  Avoid drinking alcohol. Drinking lowers your chances of successfully quitting.  Reduce the amount of caffeine you consume. Once you quit smoking, the amount of caffeine in your body increases and can give you symptoms, such as a rapid heartbeat, sweating, and anxiety.  Avoid smokers because they can make you want to smoke.  Do not let weight gain distract you. Many smokers will gain weight when they quit, usually less than 10 pounds. Eat a healthy diet and stay active. You can always lose the weight gained after you quit.  Find ways to improve your mood other than smoking. FOR MORE INFORMATION  www.smokefree.gov  Document Released: 10/01/2001 Document Revised: 02/21/2014 Document Reviewed: 01/16/2012 [facility] Patient Information 2015 [place], [place]. This information is not intended to replace advice given to you by your health care provider. Make sure you discuss any questions you have with your health care provider. Cancer of the Floor of the Mouth Cancer of the floor of the mouth occurs when a group of cells under your  tongue become abnormal and start to grow out of control. Most of the time, this type of cancer starts in very thin, flat cells (squamous cells) that cover the surface of your mouth. Cancer cells can spread and form a mass of cells called a tumor. The cancer may spread deeper into the floor of the mouth, or it may spread to other areas of the body (metastasize). RISK FACTORS The exact cause of cancer of the floor of the mouth is not known. However, some risk factors make this more likely:  Use of tobacco products, including cigarettes, pipes, cigars, smokeless (chewing) tobacco, and snuff. This is the number one risk factor for cancer of the floor of the mouth.  Female gender.  Age of 92 years or older.  Poor oral hygiene (not brushing your teeth or flossing  regularly).  Frequent use of alcohol.  Human papillomavirus (HPV) infection. SYMPTOMS  An open sore (ulcer) on the floor of your mouth, which does not heal. The sore usually does not hurt.  A white or red patch on the floor of your mouth. This might show up before an ulcer appears, or it might be next to an ulcer.  A lump on the floor of your mouth.  Pain in your mouth.  Pain in your ear.  Bad breath.  Loose teeth, dentures that no longer fit, or dentures that are painful to put in or remove.  Bleeding in your mouth.  Pain in your jaw.  Numbness in your mouth or on your chin.  A lump on your neck. DIAGNOSIS To diagnose cancer of the floor of the mouth, your caregiver may perform the following exams:  A physical exam of your mouth, throat, and neck for a sore or lump. Your caregiver will use a finger from one hand to feel the floor of your mouth and use the other hand to press firmly under your jaw.  Removal and exam of a small number of cells (biopsy) from your mouth or a lump in your neck. These cells are checked under a microscope for cancerous formations.  Imaging exams, such as X-rays of your mouth and neck. The images can show if there is an abnormal mass. If you do have cancer, your caregiver will stage your cancer. Staging provides an idea of how advanced your cancer is. The stage of your cancer will depend on how much it has grown and if it has metastasized. The meaning of the stage depends on the type of cancer. For cancer of the floor of the mouth:  Stage I means the cancer is the size of a peanut or smaller. It has not metastasized.  Stage II means the cancer is larger than a peanut, but not larger than a walnut. It has not metastasized.  Stage III means the cancer has grown larger than a walnut. It may have spread to a lymph node or lymph gland on the same side of your neck as the cancer. (Lymph is a fluid that carries white blood cells all over your body. White  blood cells fight infection.)  Stage IV means the cancer has spread to nearby areas. It may have spread heavily into your lymph glands. TREATMENT Treatment for floor of the mouth cancer can vary. It will depend on the stage of your cancer and your overall health. Treatment options may include:  Radiation therapy. This uses waves of nuclear energy to kill cancer cells. It may be used as the only treatment for stage I or stage  II cancers.  Surgery to remove the cancer cells. Surgery done for stage I and stage II cancers will not change your speech or swallowing very much. Surgery for stage III and IV cancers will permanently change the appearance of your mouth as well as your swallowing and speech.  A combination of radiation, surgery, and drugs that kill cancer cells (chemotherapy). This may be needed for stage III and stage IV cancers. Additional surgery may be needed if the cancer has spread to your lymph nodes or jawbone. You may also need surgery to make it easier for you to swallow and talk. SEEK MEDICAL CARE IF:  You have pain in your mouth or ear.  Your mouth or chin is numb.  The way you speak changes.  The way you swallow changes. SEEK IMMEDIATE MEDICAL CARE IF:   Your pain gets worse. The pain may be in your mouth or your ear.  You have bleeding in your mouth.  Your mouth or neck swells.  You have difficulty swallowing.  Your lower teeth become loose or painful.  You have difficulty breathing.  You have a fever. Document Released: 01/22/2011 Document Revised: 12/30/2011 Document Reviewed: 01/22/2011 [facility] Patient Information 2015 [place], [place]. This information is not intended to replace advice given to you by your health care provider. Make sure you discuss any questions you have with your health care provider.

## 2015-05-05 ENCOUNTER — Ambulatory Visit (INDEPENDENT_AMBULATORY_CARE_PROVIDER_SITE_OTHER): Payer: Medicare Other | Admitting: Family Medicine

## 2015-05-05 VITALS — BP 151/71 | HR 90 | Temp 97.9°F | Resp 16 | Ht <= 58 in | Wt 100.0 lb

## 2015-05-05 DIAGNOSIS — R220 Localized swelling, mass and lump, head: Secondary | ICD-10-CM

## 2015-05-05 DIAGNOSIS — K088 Other specified disorders of teeth and supporting structures: Secondary | ICD-10-CM | POA: Diagnosis not present

## 2015-05-05 DIAGNOSIS — R221 Localized swelling, mass and lump, neck: Secondary | ICD-10-CM

## 2015-05-05 DIAGNOSIS — K089 Disorder of teeth and supporting structures, unspecified: Secondary | ICD-10-CM

## 2015-05-05 DIAGNOSIS — I10 Essential (primary) hypertension: Secondary | ICD-10-CM | POA: Diagnosis not present

## 2015-05-05 DIAGNOSIS — M159 Polyosteoarthritis, unspecified: Secondary | ICD-10-CM

## 2015-05-05 DIAGNOSIS — R350 Frequency of micturition: Secondary | ICD-10-CM

## 2015-05-05 DIAGNOSIS — G4700 Insomnia, unspecified: Secondary | ICD-10-CM | POA: Diagnosis not present

## 2015-05-05 DIAGNOSIS — M150 Primary generalized (osteo)arthritis: Secondary | ICD-10-CM

## 2015-05-05 DIAGNOSIS — D649 Anemia, unspecified: Secondary | ICD-10-CM | POA: Diagnosis not present

## 2015-05-05 DIAGNOSIS — E559 Vitamin D deficiency, unspecified: Secondary | ICD-10-CM

## 2015-05-05 LAB — POCT URINALYSIS DIPSTICK
BILIRUBIN UA: NEGATIVE
GLUCOSE UA: NEGATIVE
Ketones, UA: NEGATIVE
Nitrite, UA: NEGATIVE
Protein, UA: 30
SPEC GRAV UA: 1.015
Urobilinogen, UA: 0.2
pH, UA: 7

## 2015-05-05 LAB — POCT UA - MICROSCOPIC ONLY
CASTS, UR, LPF, POC: NEGATIVE
Crystals, Ur, HPF, POC: NEGATIVE
MUCUS UA: NEGATIVE
RBC, urine, microscopic: NEGATIVE
YEAST UA: NEGATIVE

## 2015-05-05 MED ORDER — VITAMIN D3 125 MCG (5000 UT) PO CHEW: 1.0000 | CHEWABLE_TABLET | Freq: Every day | ORAL | Status: DC

## 2015-05-05 MED ORDER — LISINOPRIL 10 MG PO TABS: 10.0000 mg | ORAL_TABLET | Freq: Every day | ORAL | Status: DC

## 2015-05-05 MED ORDER — CALCIUM CARBONATE-VITAMIN D 500-200 MG-UNIT PO TABS: 1.0000 | ORAL_TABLET | Freq: Two times a day (BID) | ORAL | Status: DC

## 2015-05-05 MED ORDER — GABAPENTIN 300 MG PO CAPS: 300.0000 mg | ORAL_CAPSULE | Freq: Every day | ORAL | Status: DC

## 2015-05-05 NOTE — Progress Notes (Addendum)
Subjective:  This chart was scribed for Delman Cheadle, MD by Thea Alken, ED Scribe. This patient was seen in room 22 and the patient's care was started at 3:29 PM.   Patient ID: Barbara Dennis, female    DOB: Jul 22, 1934, 81 y.o.   MRN: [id number]  HPI Chief Complaint  Patient presents with  . Follow-up  . Hypertension   HPI Comments: Barbara Dennis is a 81 y.o. female who presents to the [facility] for HTN follow up. Patient is tolerating lisinopril. She stopped taking baby aspirin. Pt would like a vitamin that she could take every day instead of one a week. Pt has trouble sleeping due to body aches to legs, arms and backs. Pt has a decreased appetite and at times just doesn't feel like eat. She denies abdominal pain and urinary sample.    Pt also c/o dental pain. Pt was seen by dentist a couple days ago.  Pt has right sided neck mass followed by Dr. Redmond Baseman. Did a fine needle biopsy in January.    Past Medical History  Diagnosis Date  . Hypertension   . Mass of right side of neck    Past Surgical History  Procedure Laterality Date  . Appendectomy     Prior to Admission medications   Medication Sig Start Date End Date Taking? Authorizing Provider  Acetaminophen (TYLENOL PO) Take by mouth every other day.   Yes Historical Provider, MD  ergocalciferol (DRISDOL) 50000 UNITS capsule Take 1 capsule (50,000 Units total) by mouth once a week. 01/05/15 01/05/16 Yes Barton Fanny, MD  HYDROcodone-acetaminophen (NORCO/VICODIN) 5-325 MG per tablet Take 1/2 to 1 po every 8hrs prn pain pc 03/29/15  Yes Orma Flaming, MD  lisinopril (PRINIVIL,ZESTRIL) 5 MG tablet Take 1 tablet (5 mg total) by mouth daily. 10/04/14  Yes Barton Fanny, MD  mirtazapine (REMERON) 7.5 MG tablet Take 1 tablet (7.5 mg total) by mouth at bedtime. 01/03/15  Yes Barton Fanny, MD  Multiple Vitamins-Minerals (MULTIVITAMIN WITH MINERALS) tablet Take 1 tablet by mouth daily.   Yes Historical  Provider, MD  Naproxen Sodium (ALEVE PO) Take by mouth as needed.   Yes Historical Provider, MD   Review of Systems  Constitutional: Positive for appetite change.  HENT: Positive for dental problem.   Gastrointestinal: Negative for abdominal pain.  Musculoskeletal: Positive for myalgias. Negative for arthralgias.  Psychiatric/Behavioral: Positive for sleep disturbance.    Objective:   Physical Exam  Constitutional: She is oriented to person, place, and time. She appears well-developed and well-nourished. No distress.  HENT:  Head: Normocephalic and atraumatic.  Eyes: Conjunctivae and EOM are normal.  Neck: Neck supple. No thyromegaly present.  Cardiovascular: Normal rate and regular rhythm.   Murmur ( injected) heard.  Systolic (left upper sternal border) murmur is present with a grade of 2/6  Pulmonary/Chest: Effort normal.  Musculoskeletal: Normal range of motion.  Lymphadenopathy:    She has cervical adenopathy ( anterior, bilaterally).  Neurological: She is alert and oriented to person, place, and time.  Skin: Skin is warm and dry.  Psychiatric: She has a normal mood and affect. Her behavior is normal.  Nursing note and vitals reviewed.  Filed Vitals:   05/05/15 1500  BP: 151/71  Pulse: 90  Temp: 97.9 F (36.6 C)  Resp: 16  Height: 4\' 10"  (1.473 m)  Weight: 100 lb (45.36 kg)   Assessment & Plan:   Need to check CBC and  Vitamin  D at follow up appointment. Recheck UA and clx at next visit as well as pt with a UTI today and she is asymptomatic (checked as noted foul odor of urine on pt's exam).  Language level caveat.  1. HTN, goal below 150/90 - pt reassured that due to age some mild elevation in PB is unlikely to be harmful to her and more health risk with hypotension. Monitor BP outside office - see AVS.  Increase lisinopril from 5 to 10.  2. Primary osteoarthritis involving multiple joints   3. Insomnia - pt c/o insomnia due to joint pain so start trial of qhs  gabapentin 300  4. Poor dentition   5. Vitamin D deficiency   6. Anemia, unspecified anemia type   7. Swelling, mass, or lump in head and neck   8. Essential hypertension, benign   9. Urinary frequency   10.  UTI - had amox 6 wks ago so start keflex 500 bid x 10d - GFR ranges from 47-58 - e.coli was resistant to keflex so cancelled and will try bactrim DS bid x 3d instead.  Orders Placed This Encounter  Procedures  . Urine culture  . POCT UA - Microscopic Only  . POCT urinalysis dipstick    Meds ordered this encounter  Medications  . gabapentin (NEURONTIN) 300 MG capsule    Sig: Take 1 capsule (300 mg total) by mouth at bedtime. For pain at night    Dispense:  30 capsule    Refill:  3  . lisinopril (PRINIVIL,ZESTRIL) 10 MG tablet    Sig: Take 1 tablet (10 mg total) by mouth daily.    Dispense:  90 tablet    Refill:  3  . calcium-vitamin D (OSCAL 500/200 D-3) 500-200 MG-UNIT per tablet    Sig: Take 1 tablet by mouth 2 (two) times daily.    Dispense:  180 tablet    Refill:  3  . Cholecalciferol (VITAMIN D3) 5000 UNITS CHEW    Sig: Chew 1 tablet by mouth daily.    Dispense:  90 tablet    Refill:  3  . cephALEXin (KEFLEX) 500 MG capsule    Sig: Take 1 capsule (500 mg total) by mouth 2 (two) times daily.    Dispense:  20 capsule    Refill:  0    I personally performed the services described in this documentation, which was scribed in my presence. The recorded information has been reviewed and considered, and addended by me as needed.  Delman Cheadle, MD MPH

## 2015-05-05 NOTE — Patient Instructions (Signed)
Managing Your High Blood Pressure Blood pressure is a measurement of how forceful your blood is pressing against the walls of the arteries. Arteries are muscular tubes within the circulatory system. Blood pressure does not stay the same. Blood pressure rises when you are active, excited, or nervous; and it lowers during sleep and relaxation. If the numbers measuring your blood pressure stay above normal most of the time, you are at risk for health problems. High blood pressure (hypertension) is a long-term (chronic) condition in which blood pressure is elevated. A blood pressure reading is recorded as two numbers, such as 120 over 80 (or 120/80). The first, higher number is called the systolic pressure. It is a measure of the pressure in your arteries as the heart beats. The second, lower number is called the diastolic pressure. It is a measure of the pressure in your arteries as the heart relaxes between beats.  Keeping your blood pressure in a normal range is important to your overall health and prevention of health problems, such as heart disease and stroke. When your blood pressure is uncontrolled, your heart has to work harder than normal. High blood pressure is a very common condition in adults because blood pressure tends to rise with age. Men and women are equally likely to have hypertension but at different times in life. Before age 45, men are more likely to have hypertension. After 79 years of age, women are more likely to have it. Hypertension is especially common in African Americans. This condition often has no signs or symptoms. The cause of the condition is usually not known. Your caregiver can help you come up with a plan to keep your blood pressure in a normal, healthy range. BLOOD PRESSURE STAGES Blood pressure is classified into four stages: normal, prehypertension, stage 1, and stage 2. Your blood pressure reading will be used to determine what type of treatment, if any, is necessary.  Appropriate treatment options are tied to these four stages:  Normal  Systolic pressure (mm Hg): below 120.  Diastolic pressure (mm Hg): below 80. Prehypertension  Systolic pressure (mm Hg): 120 to 139.  Diastolic pressure (mm Hg): 80 to 89. Stage1  Systolic pressure (mm Hg): 140 to 159.  Diastolic pressure (mm Hg): 90 to 99. Stage2  Systolic pressure (mm Hg): 160 or above.  Diastolic pressure (mm Hg): 100 or above. RISKS RELATED TO HIGH BLOOD PRESSURE Managing your blood pressure is an important responsibility. Uncontrolled high blood pressure can lead to:  A heart attack.  A stroke.  A weakened blood vessel (aneurysm).  Heart failure.  Kidney damage.  Eye damage.  Metabolic syndrome.  Memory and concentration problems. HOW TO MANAGE YOUR BLOOD PRESSURE Blood pressure can be managed effectively with lifestyle changes and medicines (if needed). Your caregiver will help you come up with a plan to bring your blood pressure within a normal range. Your plan should include the following: Education  Read all information provided by your caregivers about how to control blood pressure.  Educate yourself on the latest guidelines and treatment recommendations. New research is always being done to further define the risks and treatments for high blood pressure. Lifestylechanges  Control your weight.  Avoid smoking.  Stay physically active.  Reduce the amount of salt in your diet.  Reduce stress.  Control any chronic conditions, such as high cholesterol or diabetes.  Reduce your alcohol intake. Medicines  Several medicines (antihypertensive medicines) are available, if needed, to bring blood pressure within a normal range.  Communication  Review all the medicines you take with your caregiver because there may be side effects or interactions.  Talk with your caregiver about your diet, exercise habits, and other lifestyle factors that may be contributing to  high blood pressure.  See your caregiver regularly. Your caregiver can help you create and adjust your plan for managing high blood pressure. RECOMMENDATIONS FOR TREATMENT AND FOLLOW-UP  The following recommendations are based on current guidelines for managing high blood pressure in nonpregnant adults. Use these recommendations to identify the proper follow-up period or treatment option based on your blood pressure reading. You can discuss these options with your caregiver.  Systolic pressure of 267 to 124 or diastolic pressure of 80 to 89: Follow up with your caregiver as directed.  Systolic pressure of 580 to 998 or diastolic pressure of 90 to 100: Follow up with your caregiver within 2 months.  Systolic pressure above 338 or diastolic pressure above 250: Follow up with your caregiver within 1 month.  Systolic pressure above 539 or diastolic pressure above 767: Consider antihypertensive therapy; follow up with your caregiver within 1 week.  Systolic pressure above 341 or diastolic pressure above 937: Begin antihypertensive therapy; follow up with your caregiver within 1 week. Document Released: 07/01/2012 Document Reviewed: 07/01/2012 [facility] Patient Information 2015 [place]. This information is not intended to replace advice given to you by your health care provider. Make sure you discuss any questions you have with your health care provider. Vim x??ng kh?p (Osteoarthritis) Vim x??ng kh?p l m?t b?nh gy ?au v vim kh?p. B?nh x?y ra khi s?n ? ch? kh?p vim b? mn. S?n c tc d?ng nh? m?t t?m n?m, b?c cc ??u x??ng, n?i chng g?p nhau ?? t?o thnh kh?p. Vim x??ng kh?p l d?ng ph? bi?n nh?t c?a vim kh?p. B?nh th??ng g?p ? ng??i cao tu?i. Th??ng b? ?nh h??ng b?i tnh tr?ng ny nh?t l cc kh?p ?:  Cc ??u ngn tay.  Ngn tay ci.  C?.  Th?t l?ng.  ??u g?i.  Hng. NGUYN NHN  Theo th?i gian, ph?n s?n b?c cc ??u x??ng b?t ??u mn d?n. ?i?u ny lm cho x??ng c? vo  x??ng, gy ?au v c?ng ? cc kh?p b? ?nh h??ng.  CC Y?U T? NGUY C? M?t s? y?u t? nh?t ??nh c th? lm t?ng kh? n?ng b? vim x??ng kh?p, bao g?m:  Tu?i cao.  Tr?ng l??ng c? th? th?a.  S? d?ng cc kh?p qu m?c. D?U HI?U V TRI?U CH?NG   ?au, s?ng, v c?ng kh?p.  Theo th?i gian, kh?p c th? b? m?t hnh d?ng bnh th??ng c?a n.  M?t l??ng x??ng nh? tch t? (ch?i x??ng) c th? pht tri?n trn cc b? kh?p.  Cc m?nh x??ng ho?c s?n c th? v? ra v tri n?i trong khe kh?p. ?i?u ny c th? gy ?au v t?n th??ng. CH?N ?ON  Chuyn gia ch?m [place] s?c kh?e c?a qu v? s? khm th?c th? v h?i v? nh?ng tri?u ch?ng c?a qu v?. C th? c?n lm cc ki?m tra nh?:  Ch?p X quang kh?p b? ?nh h??ng.  Ch?p MRI.  Xt nghi?m mu ?? lo?i tr? cc lo?i vim kh?p khc.  Xt nghi?m d?ch kh?p. ?i?u ny lin quan ??n vi?c s? d?ng kim ?? ht ch?t l?ng ? kh?p v ki?m tra ch?t l?ng d??i knh hi?n vi. ?I?U TR?  M?c tiu c?a vi?c ?i?u tr? l ki?m sot c?n ?au v c?i thi?n ch?c n?ng kh?p Cc  k? ho?ch ?i?u tr? c th? bao g?m:  M?t ch??ng trnh t?p th? d?c ???c ch? ??nh cho php ngh? ng?i v gi?m ?au kh?p.  M?t k? ho?ch ki?m sot cn n?ng.  Cc k? thu?t gi?m ?au nh?:  Ch??m nng v l?nh ?ng cch.  Xung ?i?n ???c chuy?n ??n cc ??u dy th?n kinh d??i da (kch thch h? th?n kinh b?ng ?i?n qua da [TENS]).  Xoa bp.  M?t s? th?c ph?m ch?c n?ng nh?t ??nh.  Thu?c ?? ki?m sot c?n ?au, ch?ng h?n nh?:  Acetaminophen.  Thu?c ch?ng vim khng c steroid (NSAID), ch?ng h?n nh? naproxen.  Thu?c m ho?c thu?c c tc d?ng ln h? th?n kinh trung ??ng, ch?ng h?n nh? tramadol.  Corticosteroid. Thu?c ny c th? dng ?? u?ng ho?c tim.  Ph?u thu?t ??t l?i v? tr cc x??ng v gi?m ?au (th? thu?t ??c x??ng) ho?c l?y nh?ng m?nh x??ng v s?n r?i ra. C th? c?n thay kh?p ? giai ?o?n cu?i c?a vim x??ng kh?p. H??NG D?N CH?M [place] T?I NH   Ch? s? d?ng thu?c theo ch? d?n c?a chuyn gia ch?m [place] s?c kh?e.  Duy tr cn n?ng c  l?i cho s?c kh?e. Tun th? theo ch? ??n c?a chuyn gia ch?m [place] s?c kh?e ?? ki?m sot cn n?ng. Vi?c ny c th? bao g?m nh?ng h??ng d?n v? ch? ?? ?n.  T?p th? d?c theo ch? d?n. Chuyn gia ch?m [place] s?c kh?e c th? gi?i thi?u cc lo?i bi t?p c? th?. Nh?ng bi t?p ny c th? bao g?m:  T?p t?ng c??ng s?c m?nh. Cc bi t?p ny ???c th?c hi?n ?? t?ng c??ng c? b?p h? tr? cc kh?p b? ?nh h??ng b?i vim kh?p. Cc bi t?p ny c th? ???c th?c hi?n v?i cc qu? t? ho?c cc d?i b?ng t?p ?? thm s?c ch?u ??ng.  Ho?t ??ng aerobic. ?y l nh?ng bi t?p, ch?ng h?n nh? ?i b? nhanh ho?c th? d?c nh?p ?i?u tc ??ng th?p, lm cho tim qu v? ??p m?nh.  Cc ho?t ??ng gip cho kh?p m?m d?o. Nh?ng ho?t ??ng ny gi? cho cc kh?p x??ng m?m de?o.  Cc bi t?p cn b?ng v nhanh nh?n. Cc bi t?p ny gip qu v? duy tr cc k? n?ng s?ng hng ngy.  ?? cho kh?p b? ?nh h??ng ngh? ng?i theo ch? d?n c?a chuyn gia ch?m [place] s?c kh?e.  Tun th? m?i cu?c h?n khm l?i theo ch? d?n c?a chuyn gia ch?m [place] s?c kh?e. ?I KHM N?U:   Da c?a qu v? chuy?n sang mu ??.  Ngoi ?au kh?p, qu v? cn b? pht ban.  Qu v? b? ?au kh?p n?ng h?n.  Qu v? b? s?t km v?i ?au kh?p ho?c ?au c?. NGAY L?P T?C ?I KHM N?U:  Qu v? b? st cn ?ng k? ho?c m?t c?m gic ngon mi?ng.  Qu v? ?? m? hi ban ?m. ?? BI?T THM [place] of Arthritis and Musculoskeletal and Skin Diseases (Vi?n Vim kh?p v C? x??ng v B?nh da Qu?c gia): www.niams.[employer].es  [employer] on Aging (Vi?n Lo khoa Qu?c gia): http://kim-miller.com/  American College of Rheumatology (Tr??ng ?o t?o v? Th?p kh?p M?): www.rheumatology.org Document Released: 10/07/2005 Document Revised: 02/21/2014 [facility] Patient Information 2015 [place], [place]. This information is not intended to replace advice given to you by your health care provider. Make sure you discuss any questions you have with your health care provider.

## 2015-05-08 LAB — URINE CULTURE: Colony Count: >=100000

## 2015-05-08 MED ORDER — CEPHALEXIN 500 MG PO CAPS: 500.0000 mg | ORAL_CAPSULE | Freq: Two times a day (BID) | ORAL | Status: DC

## 2015-05-08 MED ORDER — SULFAMETHOXAZOLE-TRIMETHOPRIM 800-160 MG PO TABS: 1.0000 | ORAL_TABLET | Freq: Two times a day (BID) | ORAL | Status: DC

## 2015-05-08 NOTE — Addendum Note (Signed)
Addended by: Delman Cheadle on: 05/08/2015 03:00 PM   Modules accepted: Orders, Medications, SmartSet

## 2015-05-11 ENCOUNTER — Telehealth: Payer: Self-pay | Admitting: Family Medicine

## 2015-05-11 NOTE — Telephone Encounter (Signed)
° ° °  [place]

## 2015-05-11 NOTE — Telephone Encounter (Signed)
Pt neighbor called trying to speak for the pt regarding an issue the pt was having.  Pt spoke little english so the neighbor was being a good citizen.  However, I told him that since he was not on her hippa form/release of information I could not release any information.  He understood.  I spoke with the pt and figured out that the medication of Keflex was not working for her.  I advised the pt that she needs to discontinue that medication and go to the pharmacy and pick up a new meds called Bactrim.  At this point she put her neighbor back on the phone and I only advised him to take the pt to the pharmacy to pick up her new medication and to stop as of right now taking the Keflex.  The neighbor understood as well as the pt.

## 2015-05-11 NOTE — Telephone Encounter (Signed)
Patient's neighbor called because patient was having issues with her medication. Barbara Dennis helped with this.

## 2015-08-01 ENCOUNTER — Other Ambulatory Visit: Payer: Self-pay | Admitting: Family Medicine

## 2015-09-07 ENCOUNTER — Ambulatory Visit (INDEPENDENT_AMBULATORY_CARE_PROVIDER_SITE_OTHER): Payer: Medicare Other

## 2015-09-07 ENCOUNTER — Encounter: Payer: Self-pay | Admitting: Family Medicine

## 2015-09-07 ENCOUNTER — Ambulatory Visit (INDEPENDENT_AMBULATORY_CARE_PROVIDER_SITE_OTHER): Payer: Medicare Other | Admitting: Family Medicine

## 2015-09-07 VITALS — BP 143/67 | HR 84 | Temp 98.0°F | Resp 18 | Ht <= 58 in | Wt 101.0 lb

## 2015-09-07 DIAGNOSIS — M150 Primary generalized (osteo)arthritis: Secondary | ICD-10-CM | POA: Diagnosis not present

## 2015-09-07 DIAGNOSIS — B370 Candidal stomatitis: Secondary | ICD-10-CM

## 2015-09-07 DIAGNOSIS — E559 Vitamin D deficiency, unspecified: Secondary | ICD-10-CM

## 2015-09-07 DIAGNOSIS — N390 Urinary tract infection, site not specified: Secondary | ICD-10-CM

## 2015-09-07 DIAGNOSIS — R05 Cough: Secondary | ICD-10-CM | POA: Diagnosis not present

## 2015-09-07 DIAGNOSIS — R059 Cough, unspecified: Secondary | ICD-10-CM

## 2015-09-07 DIAGNOSIS — F418 Other specified anxiety disorders: Secondary | ICD-10-CM

## 2015-09-07 DIAGNOSIS — R590 Localized enlarged lymph nodes: Secondary | ICD-10-CM

## 2015-09-07 DIAGNOSIS — I10 Essential (primary) hypertension: Secondary | ICD-10-CM | POA: Diagnosis not present

## 2015-09-07 DIAGNOSIS — Z720 Tobacco use: Secondary | ICD-10-CM

## 2015-09-07 DIAGNOSIS — G4700 Insomnia, unspecified: Secondary | ICD-10-CM

## 2015-09-07 DIAGNOSIS — M159 Polyosteoarthritis, unspecified: Secondary | ICD-10-CM

## 2015-09-07 DIAGNOSIS — M25541 Pain in joints of right hand: Secondary | ICD-10-CM | POA: Diagnosis not present

## 2015-09-07 LAB — POCT URINALYSIS DIP (MANUAL ENTRY)
BILIRUBIN UA: NEGATIVE
Glucose, UA: NEGATIVE
Ketones, POC UA: NEGATIVE
Nitrite, UA: NEGATIVE
PROTEIN UA: NEGATIVE
Spec Grav, UA: 1.02
Urobilinogen, UA: 0.2
pH, UA: 6

## 2015-09-07 LAB — POC MICROSCOPIC URINALYSIS (UMFC): Mucus: ABSENT

## 2015-09-07 LAB — POCT CBC
GRANULOCYTE PERCENT: 61.8 % (ref 37–80)
HCT, POC: 33.7 % — AB (ref 37.7–47.9)
Hemoglobin: 11.8 g/dL — AB (ref 12.2–16.2)
Lymph, poc: 2.2 (ref 0.6–3.4)
MCH: 32.5 pg — AB (ref 27–31.2)
MCHC: 35.2 g/dL (ref 31.8–35.4)
MCV: 92.5 fL (ref 80–97)
MID (CBC): 0.6 (ref 0–0.9)
MPV: 6.3 fL (ref 0–99.8)
PLATELET COUNT, POC: 251 10*3/uL (ref 142–424)
POC GRANULOCYTE: 4.4 (ref 2–6.9)
POC LYMPH PERCENT: 30.5 %L (ref 10–50)
POC MID %: 7.7 %M (ref 0–12)
RBC: 3.64 M/uL — AB (ref 4.04–5.48)
RDW, POC: 13.8 %
WBC: 7.2 10*3/uL (ref 4.6–10.2)

## 2015-09-07 LAB — POCT SEDIMENTATION RATE: POCT SED RATE: 75 mm/hr — AB (ref 0–22)

## 2015-09-07 MED ORDER — IPRATROPIUM BROMIDE 0.02 % IN SOLN
0.5000 mg | Freq: Once | RESPIRATORY_TRACT | Status: AC
  Administered 2015-09-07: 0.5 mg via RESPIRATORY_TRACT

## 2015-09-07 MED ORDER — DESIPRAMINE HCL 10 MG PO TABS: 10.0000 mg | ORAL_TABLET | Freq: Every day | ORAL | Status: DC

## 2015-09-07 MED ORDER — PREDNISONE 20 MG PO TABS: 40.0000 mg | ORAL_TABLET | Freq: Every day | ORAL | Status: DC

## 2015-09-07 MED ORDER — GUAIFENESIN ER 1200 MG PO TB12: 1.0000 | ORAL_TABLET | Freq: Two times a day (BID) | ORAL | Status: DC | PRN

## 2015-09-07 MED ORDER — AZITHROMYCIN 250 MG PO TABS: ORAL_TABLET | ORAL | Status: DC

## 2015-09-07 MED ORDER — ALBUTEROL SULFATE (2.5 MG/3ML) 0.083% IN NEBU
2.5000 mg | INHALATION_SOLUTION | Freq: Once | RESPIRATORY_TRACT | Status: AC
  Administered 2015-09-07: 2.5 mg via RESPIRATORY_TRACT

## 2015-09-07 MED ORDER — NYSTATIN 100000 UNIT/ML MT SUSP: 5.0000 mL | Freq: Four times a day (QID) | OROMUCOSAL | Status: DC

## 2015-09-07 MED ORDER — HYDROCODONE-ACETAMINOPHEN 5-325 MG PO TABS: 1.0000 | ORAL_TABLET | Freq: Four times a day (QID) | ORAL | Status: DC | PRN

## 2015-09-07 MED ORDER — ALBUTEROL SULFATE HFA 108 (90 BASE) MCG/ACT IN AERS: 2.0000 | INHALATION_SPRAY | RESPIRATORY_TRACT | Status: DC | PRN

## 2015-09-07 MED ORDER — GABAPENTIN 300 MG PO CAPS
300.0000 mg | ORAL_CAPSULE | Freq: Every day | ORAL | Status: DC
Start: 2015-09-07 — End: 2015-10-19

## 2015-09-07 NOTE — Progress Notes (Signed)
Subjective:    Patient ID: Barbara Dennis, female    DOB: 09/19/1934, 81 y.o.   MRN: [id number] Chief Complaint  Patient presents with  . Follow-up    High Blood Pressure  . Hand Pain    Onset 2 weeks/ fingers hurt, catching sometimes  . Hip Pain    onset 1 month    HPI  Barbara Dennis is a 81 y.o. female who presents to the [facility] for HTN follow up. Patient is tolerating lisinopril. She stopped taking baby aspirin.  Pt has trouble sleeping due to body aches to legs, arms and backs for many years. Pain in the PIP of the right index.  Pt c/o finger pain and buttock pain.  No urinary freq.  Wants hydrocodone, wantch pill to sleep, gabapentin working but not sronger  Pt has a decreased appetite and at times just doesn't feel like eat. She denies abdominal pain and urinary sample.  Only supp she takes is vit C.  Pt also c/o dental pain. Pt was seen by dentist a couple days ago.  Pt has right sided neck mass followed by Dr. Redmond Baseman. Did a fine needle biopsy in January. Her neck is fine.  C/o pain in her bilateral lower abd.   Cough for long time producitve - smoking still   Past Medical History  Diagnosis Date  . Hypertension   . Mass of right side of neck    Past Surgical History  Procedure Laterality Date  . Appendectomy     Prior to Admission medications   Medication Sig Start Date End Date Taking? Authorizing Provider  Acetaminophen (TYLENOL PO) Take by mouth every other day.   Yes Historical Provider, MD  ergocalciferol (DRISDOL) 50000 UNITS capsule Take 1 capsule (50,000 Units total) by mouth once a week. 01/05/15 01/05/16 Yes Barton Fanny, MD  HYDROcodone-acetaminophen (NORCO/VICODIN) 5-325 MG per tablet Take 1/2 to 1 po every 8hrs prn pain pc 03/29/15  Yes Orma Flaming, MD  lisinopril (PRINIVIL,ZESTRIL) 5 MG tablet Take 1 tablet (5 mg total) by mouth daily. 10/04/14  Yes Barton Fanny, MD  mirtazapine (REMERON) 7.5 MG tablet Take 1  tablet (7.5 mg total) by mouth at bedtime. 01/03/15  Yes Barton Fanny, MD  Multiple Vitamins-Minerals (MULTIVITAMIN WITH MINERALS) tablet Take 1 tablet by mouth daily.   Yes Historical Provider, MD  Naproxen Sodium (ALEVE PO) Take by mouth as needed.   Yes Historical Provider, MD   Review of Systems  Constitutional: Positive for appetite change. Negative for fever, chills and activity change.  HENT: Positive for congestion, dental problem, postnasal drip and sinus pressure. Negative for rhinorrhea, sore throat and trouble swallowing.   Respiratory: Positive for cough. Negative for shortness of breath and wheezing.   Cardiovascular: Negative for chest pain and leg swelling.  Gastrointestinal: Negative for abdominal pain.  Genitourinary: Positive for flank pain. Negative for dysuria, frequency, enuresis and difficulty urinating.  Musculoskeletal: Positive for myalgias, back pain, joint swelling and arthralgias. Negative for gait problem, neck pain and neck stiffness.  Allergic/Immunologic: Negative for immunocompromised state.  Neurological: Negative for dizziness and numbness.  Psychiatric/Behavioral: Positive for sleep disturbance.    Objective:   Physical Exam  Constitutional: She is oriented to person, place, and time. She appears well-developed and well-nourished. No distress.  HENT:  Head: Normocephalic and atraumatic.  Eyes: Conjunctivae and EOM are normal.  Neck: Neck supple. No thyromegaly present.  Cardiovascular: Normal rate and regular rhythm.  Murmur ( injected) heard.  Systolic (left upper sternal border) murmur is present with a grade of 2/6  Pulmonary/Chest: Effort normal. She has decreased breath sounds. She has wheezes.  Musculoskeletal: Normal range of motion.  Lymphadenopathy:    She has cervical adenopathy ( anterior, bilaterally).       Right cervical: Superficial cervical adenopathy present. No posterior cervical adenopathy present.      Left cervical: No  superficial cervical and no posterior cervical adenopathy present.       Right: No supraclavicular adenopathy present.       Left: No supraclavicular adenopathy present.  Neurological: She is alert and oriented to person, place, and time.  Skin: Skin is warm and dry.  Psychiatric: She has a normal mood and affect. Her behavior is normal.  Nursing note and vitals reviewed.  BP 143/67 mmHg  Pulse 84  Temp(Src) 98 F (36.7 C) (Oral)  Resp 18  Ht 4\' 10"  (1.473 m)  Wt 101 lb (45.813 kg)  BMI 21.11 kg/m2  SpO2 96%  [facility] reading (PRIMARY) by  Dr. Brigitte Pulse. CXR: increased bibasilar infiltrate, enlarged lymph node in upper right hilum  Assessment & Plan:   Recurrent UTI: 5 mos ago was treated with amox followed by partial keflex course, 4 mos ago was treated with bactrim. Rec collect urine and send for culture each visit as it appears pt might be colonized - Clx is + while pt is asymptoamtic and UA today appeared fairly benign. 8 mos prior GFR 58,macrobid is contraindicated in GFR <60, we could use suprax but would have to do 200mg  bid which is going to be difficult to fine - looks like it only comes in a chew or liquid - and likely will be cost-prohibitive. So only oral options are augmentin or bactrim and bactrim was used last.  If this is recurrent, might want to consider prophylaxis with trimethoprim 100mg  qhs   1. HTN, goal below 150/90 - pt reassured that due to age some mild elevation in PB is unlikely to be harmful to her and more health risk with hypotension. Monitor BP outside office -.  Cont lisinopril 10.  2. Primary osteoarthritis involving multiple joints   3. Tobacco user - encouraged cessation  4. Depression with anxiety   5. Cervical adenopathy   6. Recurrent UTI   7. Vitamin D deficiency - restart vit D supp  8. Cough   9. Thrush   10. Joint pain in fingers of right hand   11. Insomnia    ADDENDUM: Put on azithro for pulmonary sxs but will stop and use augmentin due to  UTI   Orders Placed This Encounter  Procedures  . Urine culture  . DG Chest 2 View    Standing Status: Future     Number of Occurrences: 1     Standing Expiration Date: 09/06/2016    Order Specific Question:  Reason for Exam (SYMPTOM  OR DIAGNOSIS REQUIRED)    Answer:  cough, ext wheeze, smoker    Order Specific Question:  Preferred imaging location?    Answer:  External  . VITAMIN D 25 Hydroxy (Vit-D Deficiency, Fractures)  . POCT Microscopic Urinalysis ([facility])  . POCT urinalysis dipstick  . POCT CBC  . POCT SEDIMENTATION RATE    Meds ordered this encounter  Medications  . albuterol (PROVENTIL) (2.5 MG/3ML) 0.083% nebulizer solution 2.5 mg    Sig:   . ipratropium (ATROVENT) nebulizer solution 0.5 mg    Sig:   . gabapentin (  NEURONTIN) 300 MG capsule    Sig: Take 1 capsule (300 mg total) by mouth at bedtime. For pain at night    Dispense:  30 capsule    Refill:  3  . HYDROcodone-acetaminophen (NORCO/VICODIN) 5-325 MG tablet    Sig: Take 1 tablet by mouth every 6 (six) hours as needed for moderate pain.    Dispense:  30 tablet    Refill:  0  . desipramine (NOPRAMIN) 10 MG tablet    Sig: Take 1 tablet (10 mg total) by mouth at bedtime. For sleep and pain    Dispense:  30 tablet    Refill:  1  . DISCONTD: nystatin (MYCOSTATIN) 100000 UNIT/ML suspension    Sig: Take 5 mLs (500,000 Units total) by mouth 4 (four) times daily.    Dispense:  280 mL    Refill:  0  . nystatin (MYCOSTATIN) 100000 UNIT/ML suspension    Sig: Take 5 mLs (500,000 Units total) by mouth 4 (four) times daily. Gargle and swish in mouth for as long as possible before swallowing    Dispense:  280 mL    Refill:  1  . Guaifenesin (MUCINEX MAXIMUM STRENGTH) 1200 MG TB12    Sig: Take 1 tablet (1,200 mg total) by mouth every 12 (twelve) hours as needed.    Dispense:  14 tablet    Refill:  0  . albuterol (PROVENTIL HFA;VENTOLIN HFA) 108 (90 BASE) MCG/ACT inhaler    Sig: Inhale 2 puffs into the lungs every 4  (four) hours as needed for wheezing or shortness of breath (cough, shortness of breath or wheezing.).    Dispense:  1 Inhaler    Refill:  1  . predniSONE (DELTASONE) 20 MG tablet    Sig: Take 2 tablets (40 mg total) by mouth daily with breakfast.    Dispense:  10 tablet    Refill:  0  . DISCONTD: azithromycin (ZITHROMAX) 250 MG tablet    Sig: Take 2 tabs PO x 1 dose, then 1 tab PO QD x 4 days    Dispense:  6 tablet    Refill:  0  . amoxicillin-clavulanate (AUGMENTIN) 500-125 MG tablet    Sig: Take 1 tablet (500 mg total) by mouth 2 (two) times daily.    Dispense:  20 tablet    Refill:  0    Delman Cheadle, MD MPH   LANGUAGE LEVEL CAVEAT - recommend trying [place] phone interpretor at visit in 1 mo.  Results for orders placed or performed in visit on 09/07/15  Urine culture  Result Value Ref Range   Culture ESCHERICHIA COLI    Colony Count >=100,000 COLONIES/ML    Organism ID, Bacteria ESCHERICHIA COLI       Susceptibility   Escherichia coli -  (no method available)    AMPICILLIN >=32 Resistant     AMOX/CLAVULANIC 4 Sensitive     AMPICILLIN/SULBACTAM 16 Intermediate     PIP/TAZO <=4 Sensitive     IMIPENEM <=0.25 Sensitive     CEFAZOLIN <=4 Not Reportable     CEFTRIAXONE <=1 Sensitive     CEFTAZIDIME <=1 Sensitive     CEFEPIME <=1 Sensitive     GENTAMICIN <=1 Sensitive     TOBRAMYCIN <=1 Sensitive     CIPROFLOXACIN >=4 Resistant     LEVOFLOXACIN 4 Intermediate     NITROFURANTOIN <=16 Sensitive     TRIMETH/SULFA* <=20 Sensitive      * NR=NOT REPORTABLE,SEE COMMENTORAL therapy:A cefazolin MIC of <32 predicts susceptibility to  the oral agents cefaclor,cefdinir,cefpodoxime,cefprozil,cefuroxime,cephalexin,and loracarbef when used for therapy of uncomplicated UTIs due to E.coli,K.pneumomiae,and P.mirabilis. PARENTERAL therapy: A cefazolinMIC of >8 indicates resistance to parenteralcefazolin. An alternate test method must beperformed to confirm susceptibility to parenteralcefazolin.   VITAMIN D 25 Hydroxy (Vit-D Deficiency, Fractures)  Result Value Ref Range   Vit D, 25-Hydroxy 26 (L) 30 - 100 ng/mL  POCT Microscopic Urinalysis ([facility])  Result Value Ref Range   WBC,UR,HPF,POC Few (A) None WBC/hpf   RBC,UR,HPF,POC None None RBC/hpf   Bacteria Moderate (A) None, Too numerous to count   Mucus Absent Absent   Epithelial Cells, UR Per Microscopy Few (A) None, Too numerous to count cells/hpf  POCT urinalysis dipstick  Result Value Ref Range   Color, UA yellow yellow   Clarity, UA clear clear   Glucose, UA negative negative   Bilirubin, UA negative negative   Ketones, POC UA negative negative   Spec Grav, UA 1.020    Blood, UA trace-lysed (A) negative   pH, UA 6.0    Protein Ur, POC negative negative   Urobilinogen, UA 0.2    Nitrite, UA Negative Negative   Leukocytes, UA Trace (A) Negative  POCT CBC  Result Value Ref Range   WBC 7.2 4.6 - 10.2 K/uL   Lymph, poc 2.2 0.6 - 3.4   POC LYMPH PERCENT 30.5 10 - 50 %L   MID (cbc) 0.6 0 - 0.9   POC MID % 7.7 0 - 12 %M   POC Granulocyte 4.4 2 - 6.9   Granulocyte percent 61.8 37 - 80 %G   RBC 3.64 (A) 4.04 - 5.48 M/uL   Hemoglobin 11.8 (A) 12.2 - 16.2 g/dL   HCT, POC 33.7 (A) 37.7 - 47.9 %   MCV 92.5 80 - 97 fL   MCH, POC 32.5 (A) 27 - 31.2 pg   MCHC 35.2 31.8 - 35.4 g/dL   RDW, POC 13.8 %   Platelet Count, POC 251 142 - 424 K/uL   MPV 6.3 0 - 99.8 fL  POCT SEDIMENTATION RATE  Result Value Ref Range   POCT SED RATE 75 (A) 0 - 22 mm/hr

## 2015-09-07 NOTE — Patient Instructions (Signed)
Cc b??c b? ht thu?c  (Steps to Quit Smoking) Ht thu?c c th? c h?i cho s?c kh?e c?a qu v? v c th? ?nh h??ng ??n h?u h?t cc c? quan trong c? th? qu v?. Ht thu?c s? lm qu v? v nh?ng ng??i xung quanh c nguy c? pht sinh nhi?u b?nh m?n tnh nghim tr?ng. Kh b? ht thu?c, nh?ng ? l m?t trong nh?ng ?i?u t?t nh?t qu v? c th? lm v s?c kh?e c?a qu v?. B? ht thu?c khng bao gi? qu mu?n.  NH?NG L?I CH C?A VI?C B? HT THU?C L L G? Khi b? ht thu?c, qu v? s? gi?m nguy c? pht sinh cc b?nh v tnh tr?ng nghim tr?ng, ch?ng h?n nh?:  Ung th? ph?i ho?c b?nh ph?i, ch?ng h?n nh? COPD.  B?nh tim.  ??t qu?Marland Kitchen  Nh?i mu c? tim.  V sinh.  Long x??ng v gy x??ng. Ngoi ra, cc tri?u ch?ng nh? ho, th? kh kh, kh th? c th? ?? h?n khi qu v? b? ht thu?c. Qu v? c?ng c th? th?y t b? ?m h?n v c? th? qu v? c s?c ?? khng m?nh h?n v?i c?m l?nh v nhi?m trng. N?u qu v? mang thai, b? ht thu?c c th? gip gi?m nguy c? sinh con nh? cn. TI C?N S?N SNG B? HT THU?C NH? TH? NO? N?u qu v? quy?t ??nh b? ht thu?c, hy l?p m?t k? ho?ch ?? ch?c ch?n qu v? s? thnh cng. Tr??c khi qu v? b? ht thu?c:  Hy ch?n m?t ngy ?? b? ht thu?c. ??nh ra m?t ngy trong hai tu?n t?i ?? qu v? c th?i gian chu?n b?.  Ghi l?i nh?ng l do m qu v? b? ht thu?c. Gi? danh sch ny ? nh?ng n?i m qu v? nhn th?y th??ng xuyn, ch?ng h?n nh? ? g??ng c?a phng t?m ho?c trong xe h?i ho?c trong v.  Xc ??nh nh?ng ng??i, ??a ?i?m, ?? v?t v ho?t ??ng lm cho qu v? mu?n ht thu?c (tc nhn kh?i pht) v trnh nh?ng tc nhn ?. Hy ch?c ch?n th?c hi?n nh?ng hnh ??ng sau:  V?t b? t?t c? thu?c l ? nh, n?i lm vi?c v trn xe h?i.  V?t b? cc ?? dng ?? ht thu?c l, ch?ng h?n nh? g?t tn v b?t l?a.  V? sinh xe h?i v ch?c ch?n ph?i v? sinh g?t tn thu?c l.  V? sinh nh c?a, k? c? rm c?a v th?m.  Ni v?i gia ?nh, b?n b v ??ng nghi?p r?ng qu v? ?ang b? ht thu?c. Vi?c h? tr? c?a ng??i  thn c th? gip qu v? b? ht thu?c d? dng h?n.  Hy ni chuy?n v?i chuyn gia ch?m [place] s?c kh?e v? nh?ng ph??ng n b? ht thu?c.  Tm hi?u nh?ng ph??ng n ?i?u tr? ???c b?o hi?m y t? c?a qu v? bao tr?Oneal Grout TH? S? D?NG NH?NG CHI?N L??C G ?? B? HT THU?C?  Hy ni chuy?n v?i chuyn gia ch?m [place] s?c kh?e v? nh?ng chi?n l??c b? ht thu?c khc nhau. M?t s? chi?n l??c bao g?m:  B? ht thu?c hon ton thay v gi?m d?n l??ng thu?c ht trong m?t th?i gian. Cc nghin c?u cho th?y vi?c b? ht thu?c hon ton thnh cng h?n b? ht thu?c d?n d?n.  G?p xin t? v?n tr?c ti?p ?? gip pht tri?n k? n?ng gi?i quy?t v?n ??Sander Nephew v? c kh? n?ng thnh cng h?n trong vi?c b?  ht thu?c n?u qu v? tham gia m?t vi bu?i t? v?n. Ngay c? nh?ng bu?i t? v?n ko di 10 pht c?ng c th? c hi?u qu?Marland Kitchen  Tm nh?ng ngu?n v h? th?ng h? tr? m c th? gip qu v? b? ht thu?c v duy tr tnh tr?ng khng khi thu?c sau khi b? ht thu?c. Nh?ng ngu?n ny l h?u ch nh?t khi qu v? s? d?ng chng th??ng xuyn. Nh?ng ngu?n ? c th? bao g?m:  Tr chuy?n tr?c tuy?n v?i chuyn gia t? v?n.  ???ng ?i?n tho?i t? v?n cai thu?c.  Ti li?u in t? tham kh?o.  Cc nhm h? tr? ho?c t? v?n theo nhm.  Cc ch??ng trnh g?i tin nh?n.  Cc ?ng d?ng ?i?n tho?i di ??ng.  Dng thu?c ?? gip qu v? b? ht thu?c. (N?u qu v? mang thai ho?c ?ang cho con b, ni chuy?n tr??c v?i chuyn gia ch?m [place] s?c kh?e). M?t s? thu?c c ch?a nicotine v s? khc th khng. C? hai lo?i thu?c ??u gip b?t thm thu?c, nh?ng nh?ng thu?c c ch?a nicotine gip gi?m nh? cc tri?u ch?ng cai thu?c. Chuyn gia ch?m [place] s?c kh?e c?a qu v? c th? khuy?n ngh?:  Mi?ng dn nicotine, k?o nhai, ho?c thu?c vin.  ?ng ht ho?c bnh x?t nicotine.  Thu?c khng c ch?a nicotine dng qua ???ng u?ng. Ni chuy?n v?i chuyn gia ch?m [place] s?c kh?e v? vi?c k?t h?p cc chi?n l??c, ch?ng h?n dng thu?c trong khi qu v? c?ng ?ang ???c t? v?n tr?c ti?p. S? d?ng hai chi?n l??c ny cng  m?t lc lm cho qu v? c kh? n?ng thnh cng h?n trong vi?c b? ht thu?c so v?i khi qu v? ch? s? d?ng ring m?t chi?n l??c. N?u qu v? mang thai ho?c cho con b, hy ni chuy?n v?i chuyn gia ch?m [place] s?c kh?e ?? tm cc ch??ng trnh t? v?n ho?c h? tr? khc ?? b? ht thu?c. Khng dng thu?c ?? b? ht thu?c tr? khi chuyn gia ch?m [place] s?c kh?e b?o qu v? lm nh? v?y. TI C TH? LM NH?NG VI?C G ?? B? HT THU?C D? H?N? B? ht thu?c lc ??u c v? kh kh?n, nh?ng c nhi?u vi?c qu v? c th? lm ?? cho n d? dng h?n. Th?c hi?n nh?ng hnh ??ng quan tr?ng sau:  Nh? gia ?nh v b?n b v yu c?u h? h? tr? v ??ng vin trong th?i gian ny. G?i ?i?n ??n ???ng dy t? v?n cai nghi?n, nh? cc nhm h? tr? ho?c lm vi?c v?i m?t chuyn gia t? v?n ?? ???c h? tr?.  ?? ngh? nh?ng ng??i ht thu?c trnh ht thu?c c?nh qu v?.  Trnh nh?ng n?i lm kh?i pht nhu c?u ht thu?c, ch?ng h?n nh? qun r??u, ti?c lin hoan ho?c khu v?c gi?i lao ht thu?c ? n?i lm vi?c.  Dnh th?i gian ? c?nh nh?ng ng??i khng ht thu?c.  Gi?m c?ng th?ng trong cu?c s?ng, v c?ng th?ng l tc nhn kh?i pht ht thu?c ??i v?i m?t s? ng??i. ?? gi?m c?ng th?ng, hy c? g?ng:  T?p th? d?c th??ng xuyn.  T?p cc bi t?p th? su.  T?p yoga.  Thi?n.  Ch?p qut c? th?. Vi?c ch?p qut ?i h?i qu v? ph?i nh?m m?t v c? th? ???c ch?p qut t? ??u ??n chn ?? pht hi?n nh?ng b? ph?n ??c bi?t c?ng th?ng. Ch? tm th? l?ng cc c? ? nh?ng khu v?c ?.  T?i v? ho?c  mua cc ch??ng trnh ?ng d?ng (?ng d?ng) dng cho ?i?n tho?i di ??ng ho?c my tnh b?ng m c th? gip qu v? theo ?ng k? ho?ch cai thu?c thng qua cc l?i nh?c, l?i khuyn v ??ng vin. C nhi?u ?ng d?ng mi?n ph, ch?ng h?n QuitGuide c?a CDC (Trung tm ki?m sot v phng ch?ng b?nh t?t) Qu v? c th? tm s? tr? gip khc ?? b? ht thu?c (ng?ng ht thu?c) thng qua [email or web address] v cc website khc. TI S? C?M TH?Y TH? NO KHI B? HT THU?C? Trong vng 24 gi? ??u t? khi b? ht thu?c, qu v?  c th? b?t ??u c?m th?y m?t s? tri?u ch?ng cai thu?c. Nh?ng tri?u ch?ng ny th??ng d? nh?n ra nh?t trong 2-3 ngy sau khi b? thu?c, nh?ng th??ng khng ko di qu 2-3 tu?n. Nh?ng thay ??i ho?c tri?u ch?ng m qu v? c th? c?m nh?n bao g?m:  Thay ??i tm tr?ng.  B?n ch?n, lo l?ng ho?c d? cu k?nh.  Kh t?p trung.  Chng m?t.  Ngoi nicotine cn thm cc lo?i th?c ?n ng?t.  T?ng cn m?t cht.  To bn.  Bu?n nn.  Ho ho?c vim h?ng.  Thay ??i c? ch? tc d?ng c?a cc lo?i thu?c trong c? th? qu v?.  Tr?ng thi tr?m c?m.  Kh ng? (m?t ng?). Sau 2-3 tu?n ??u tin b? ht thu?c, qu v? c th? b?t ??u nh?n th?y cc k?t qu? tch c?c h?n, ch?ng h?n nh?:  C?i thi?n kh?u gic v v? gic.  Gi?m ho v gi?m vim h?ng.  Nh?p tim ch?m h?n.  Huy?t p th?p h?n.  Da sng h?n.  Th? d? dng h?n.  t ngy ?m h?n. B? ht thu?c l r?t kh ??i v?i h?u h?t m?i ng??i. Khng nh?t ch n?u qu v? khng thnh cng trong l?n ??u. M?t s? ng??i c?n nhi?u l?n c g?ng b? ht thu?c tr??c khi h? c ???c thnh cng lu di. H?t s?c c? g?ng th?c hi?n theo ?ng k? ho?ch b? ht thu?c v ni chuy?n v?i chuyn gia ch?m [place] s?c kh?e n?u qu v? c b?t k? th?c m?c ho?c lo ng?i no.   Thng tin ny khng nh?m m?c ?ch thay th? cho l?i khuyn m chuyn gia ch?m [place] s?c kh?e ni v?i qu v?. Hy b?o ??m qu v? ph?i th?o lu?n b?t k? v?n ?? g m qu v? c v?i chuyn gia ch?m [place] s?c kh?e c?a qu v?.   Document Released: 01/22/2007 Document Revised: 02/21/2015 Elsevier Interactive Patient Education [employer].

## 2015-09-08 ENCOUNTER — Ambulatory Visit: Payer: Self-pay | Admitting: Family Medicine

## 2015-09-08 LAB — VITAMIN D 25 HYDROXY (VIT D DEFICIENCY, FRACTURES): VIT D 25 HYDROXY: 26 ng/mL — AB (ref 30–100)

## 2015-09-09 LAB — URINE CULTURE

## 2015-09-10 MED ORDER — AMOXICILLIN-POT CLAVULANATE 500-125 MG PO TABS: 1.0000 | ORAL_TABLET | Freq: Two times a day (BID) | ORAL | Status: DC

## 2015-09-29 ENCOUNTER — Telehealth: Payer: Self-pay | Admitting: *Deleted

## 2015-09-29 NOTE — Telephone Encounter (Signed)
Pt notified of results.  Pt is much better.  Neighbor Liliane Channel helped out with helping her to understand.

## 2015-10-19 ENCOUNTER — Encounter: Payer: Self-pay | Admitting: Family Medicine

## 2015-10-19 ENCOUNTER — Ambulatory Visit (INDEPENDENT_AMBULATORY_CARE_PROVIDER_SITE_OTHER): Payer: Medicare Other | Admitting: Family Medicine

## 2015-10-19 VITALS — BP 133/59 | HR 102 | Temp 98.2°F | Resp 16 | Ht <= 58 in | Wt 102.0 lb

## 2015-10-19 DIAGNOSIS — Z5181 Encounter for therapeutic drug level monitoring: Secondary | ICD-10-CM

## 2015-10-19 DIAGNOSIS — Z720 Tobacco use: Secondary | ICD-10-CM

## 2015-10-19 DIAGNOSIS — N390 Urinary tract infection, site not specified: Secondary | ICD-10-CM

## 2015-10-19 DIAGNOSIS — R590 Localized enlarged lymph nodes: Secondary | ICD-10-CM | POA: Diagnosis not present

## 2015-10-19 DIAGNOSIS — R61 Generalized hyperhidrosis: Secondary | ICD-10-CM

## 2015-10-19 DIAGNOSIS — D649 Anemia, unspecified: Secondary | ICD-10-CM | POA: Diagnosis not present

## 2015-10-19 DIAGNOSIS — M159 Polyosteoarthritis, unspecified: Secondary | ICD-10-CM

## 2015-10-19 DIAGNOSIS — F5105 Insomnia due to other mental disorder: Secondary | ICD-10-CM

## 2015-10-19 DIAGNOSIS — I10 Essential (primary) hypertension: Secondary | ICD-10-CM

## 2015-10-19 DIAGNOSIS — Z23 Encounter for immunization: Secondary | ICD-10-CM

## 2015-10-19 DIAGNOSIS — M150 Primary generalized (osteo)arthritis: Secondary | ICD-10-CM | POA: Diagnosis not present

## 2015-10-19 DIAGNOSIS — F419 Anxiety disorder, unspecified: Secondary | ICD-10-CM

## 2015-10-19 DIAGNOSIS — B370 Candidal stomatitis: Secondary | ICD-10-CM

## 2015-10-19 LAB — COMPREHENSIVE METABOLIC PANEL
ALK PHOS: 83 U/L (ref 33–130)
ALT: 19 U/L (ref 6–29)
AST: 22 U/L (ref 10–35)
Albumin: 4.1 g/dL (ref 3.6–5.1)
BILIRUBIN TOTAL: 0.4 mg/dL (ref 0.2–1.2)
BUN: 21 mg/dL (ref 7–25)
CALCIUM: 9.4 mg/dL (ref 8.6–10.4)
CO2: 22 mmol/L (ref 20–31)
Chloride: 103 mmol/L (ref 98–110)
Creat: 0.89 mg/dL (ref 0.60–0.93)
GLUCOSE: 96 mg/dL (ref 65–99)
POTASSIUM: 4.5 mmol/L (ref 3.5–5.3)
Sodium: 136 mmol/L (ref 135–146)
TOTAL PROTEIN: 7.8 g/dL (ref 6.1–8.1)

## 2015-10-19 LAB — POCT URINALYSIS DIP (MANUAL ENTRY)
BILIRUBIN UA: NEGATIVE
Glucose, UA: NEGATIVE
Ketones, POC UA: NEGATIVE
LEUKOCYTES UA: NEGATIVE
NITRITE UA: NEGATIVE
PH UA: 6.5
Protein Ur, POC: NEGATIVE
Spec Grav, UA: 1.015
Urobilinogen, UA: 0.2

## 2015-10-19 LAB — POC MICROSCOPIC URINALYSIS (UMFC): Mucus: ABSENT

## 2015-10-19 LAB — POCT CBC
Granulocyte percent: 63.5 %G (ref 37–80)
HEMATOCRIT: 33.3 % — AB (ref 37.7–47.9)
HEMOGLOBIN: 11.3 g/dL — AB (ref 12.2–16.2)
LYMPH, POC: 2.9 (ref 0.6–3.4)
MCH, POC: 31.7 pg — AB (ref 27–31.2)
MCHC: 34.1 g/dL (ref 31.8–35.4)
MCV: 93 fL (ref 80–97)
MID (cbc): 0.6 (ref 0–0.9)
MPV: 6.4 fL (ref 0–99.8)
POC GRANULOCYTE: 6.1 (ref 2–6.9)
POC LYMPH %: 30 % (ref 10–50)
POC MID %: 6.5 %M (ref 0–12)
Platelet Count, POC: 262 10*3/uL (ref 142–424)
RBC: 3.57 M/uL — AB (ref 4.04–5.48)
RDW, POC: 13.6 %
WBC: 9.6 10*3/uL (ref 4.6–10.2)

## 2015-10-19 LAB — C-REACTIVE PROTEIN: CRP: <0.5 mg/dL (ref <0.60)

## 2015-10-19 MED ORDER — ALBUTEROL SULFATE 108 (90 BASE) MCG/ACT IN AEPB: 2.0000 | INHALATION_SPRAY | RESPIRATORY_TRACT | Status: DC | PRN

## 2015-10-19 MED ORDER — CLOTRIMAZOLE 10 MG MT TROC: 10.0000 mg | Freq: Every day | OROMUCOSAL | Status: DC

## 2015-10-19 NOTE — Patient Instructions (Signed)
Thrush, Adult   Thrush is an infection that can happen on the mouth, throat, tongue, or other areas. It causes white patches to form on the mouth and tongue.  HOME CARE   Only take medicine as told by your doctor. You may be given medicine to swallow or to apply right on the area.   Eat plain yogurt that contains live cultures (check the label).   Rinse your mouth many times a day with a warm saltwater rinse. To make the rinse, mix 1 teaspoon (6 g) of salt in 8 ounces (0.2 L) of warm water.  To reduce pain:   Drink cold liquids such as water or iced tea.   Eat frozen ice pops or frozen juices.   Eat foods that are easy to swallow, such as gelatin or ice cream.   Drink from a straw if the patches are painful.  If you are breastfeeding:   Clean your nipples with an antifungal medicine.   Dry your nipples after breastfeeding.   Use an ointment called lanolin to help relieve nipple soreness.  If you wear dentures:   Take out your dentures before going to bed.   Brush them thoroughly.   Soak them in a denture cleaner.  GET HELP IF:    Your problems are getting worse.   Your problems are not improving within 7 days of starting treatment.   Your infection is spreading. This may show as white patches on the skin outside of your mouth.   You are nursing and have redness and pain in the nipples.  MAKE SURE YOU:   Understand these instructions.   Will watch your condition.   Will get help right away if you are not doing well or get worse.     This information is not intended to replace advice given to you by your health care provider. Make sure you discuss any questions you have with your health care provider.     Document Released: 01/01/2010 Document Revised: 07/28/2013 Document Reviewed: 05/10/2013  Elsevier Interactive Patient Education 2016 Elsevier Inc.

## 2015-10-19 NOTE — Progress Notes (Signed)
Subjective:    Patient ID: Barbara Dennis, female    DOB: Jun 08, 1934, 81 y.o.   MRN: [id number] Chief Complaint  Patient presents with  . Follow-up  . Hypertension    was told to bring in all meds next visit. Takes "4 medications, one for HTN." Does not know names of meds  . Cough    x 1 month    HPI  Used [place] health interpretor in office today who will plan to come to future visits as well.  Has a very high inflammatoyr level of 75 so asked to come in today for recheck. If persists, may want to consider chest CT.  Had right neck mass - seen ENT Dr. Redmond Dennis did needle biopsy in Jan  - no prob with it.  Cough, smoking - cxr last visit showed some copd changes with hyperinflatmion  Bibasilar bronchovascular croawding and atelectasis. Smoking 5 cig/d, occ cough of clear sputum,   She is having hand pain but she cannot sleep at night and it has nothing to do with her pain - just can't sleep.  Past Medical History  Diagnosis Date  . Hypertension   . Mass of right side of neck    Past Surgical History  Procedure Laterality Date  . Appendectomy     Current Outpatient Prescriptions on File Prior to Visit  Medication Sig Dispense Refill  . lisinopril (PRINIVIL,ZESTRIL) 10 MG tablet Take 1 tablet (10 mg total) by mouth daily. 90 tablet 3  . desipramine (NOPRAMIN) 10 MG tablet Take 1 tablet (10 mg total) by mouth at bedtime. For sleep and pain (Patient not taking: Reported on 10/19/2015) 30 tablet 1  . nystatin (MYCOSTATIN) 100000 UNIT/ML suspension Take 5 mLs (500,000 Units total) by mouth 4 (four) times daily. Gargle and swish in mouth for as long as possible before swallowing (Patient not taking: Reported on 10/19/2015) 280 mL 1   No current facility-administered medications on file prior to visit.   No Known Allergies No family history on file. Social History   Social History  . Marital Status: Widowed    Spouse Name: N/A  . Number of Children: N/A  . Years of Education:  N/A   Social History Main Topics  . Smoking status: Current Every Day Smoker -- 0.70 packs/day for 60 years    Types: Cigarettes  . Smokeless tobacco: Never Used  . Alcohol Use: No  . Drug Use: No  . Sexual Activity: Not Asked   Other Topics Concern  . None   Social History Narrative   Marital status: widowed 28 years ago.  From [place]; moved to [place] 1974.     Children: none       Lives:  Alone; brother in law is Psychologist, sport and exercise who is 50.      Employment: retired.       Tobacco:  1/2 ppd       Alcohol:  None      Exercise:  Walking daily.      ADLs: no driving.     Review of Systems  Constitutional: Positive for fatigue. Negative for fever, chills, activity change, appetite change and unexpected weight change.  Respiratory: Positive for cough. Negative for chest tightness, shortness of breath and wheezing.   Gastrointestinal: Negative for vomiting and abdominal pain.  Genitourinary: Positive for frequency. Negative for dysuria, urgency, hematuria, vaginal bleeding and difficulty urinating.  Musculoskeletal: Positive for myalgias, back pain and arthralgias. Negative for joint swelling, gait problem, neck pain and neck stiffness.  Allergic/Immunologic: Negative for immunocompromised state.  Neurological: Negative for dizziness, tremors, weakness, light-headedness, numbness and headaches.  Hematological: Positive for adenopathy.  Psychiatric/Behavioral: Positive for sleep disturbance. The patient is not nervous/anxious.        Objective:  BP 133/59 mmHg  Pulse 102  Temp(Src) 98.2 F (36.8 C)  Resp 16  Ht 4\' 10"  (1.473 m)  Wt 102 lb (46.267 kg)  BMI 21.32 kg/m2  Physical Exam  Constitutional: She is oriented to person, place, and time. She appears well-developed and well-nourished. No distress.  HENT:  Head: Normocephalic and atraumatic.  Right Ear: External ear normal.  Left Ear: External ear normal.  Eyes: Conjunctivae are normal. No scleral icterus.  Neck: Normal range of  motion. Neck supple. No thyromegaly present.  Cardiovascular: Normal rate, regular rhythm, normal heart sounds and intact distal pulses.   Pulmonary/Chest: Effort normal and breath sounds normal. No respiratory distress.  Musculoskeletal: She exhibits no edema.  Lymphadenopathy:    She has no cervical adenopathy.  Neurological: She is alert and oriented to person, place, and time.  Skin: Skin is warm and dry. She is not diaphoretic. No erythema.  Psychiatric: She has a normal mood and affect. Her behavior is normal.          Assessment & Plan:  Language level caveat Recurrent uti - recheck today Chronic stable anemia - nml mcv - has not had iron level, folate, vit b12 Vit D def - taking? Bring all meds to f/u OV. 1. Recurrent UTI - recheck as was found incidentaly to have infection sev times.  2. HTN, goal below 150/90 - on amlodipine  3. Cervical adenopathy   4. Tobacco user   5. Insomnia secondary to anxiety - restart TCA nortriptyline qhs to help sleep.  6. Primary osteoarthritis involving multiple joints   7. Medication monitoring encounter   8. Anemia, unspecified anemia type   9. Need for prophylactic vaccination and inoculation against influenza   10. Night sweat   11. Thrush - persistent but did not do adequate trial of nystatin, try clotrimazole trouches instead    Orders Placed This Encounter  Procedures  . Urine culture  . Flu Vaccine QUAD 36+ mos IM  . Comprehensive metabolic panel  . Sedimentation Rate  . C-reactive protein  . POCT urinalysis dipstick  . POCT Microscopic Urinalysis ([facility])  . POCT CBC    Meds ordered this encounter  Medications  . Albuterol Sulfate (PROAIR RESPICLICK) 123XX123 (90 Base) MCG/ACT AEPB    Sig: Inhale 2 puffs into the lungs every 4 (four) hours as needed.    Dispense:  1 each    Refill:  0  . clotrimazole (MYCELEX) 10 MG troche    Sig: Take 1 tablet (10 mg total) by mouth 5 (five) times daily.    Dispense:  70 tablet     Refill:  0    Barbara Cheadle, MD MPH  Results for orders placed or performed in visit on 10/19/15  Urine culture  Result Value Ref Range   Colony Count NO GROWTH    Organism ID, Bacteria NO GROWTH   Comprehensive metabolic panel  Result Value Ref Range   Sodium 136 135 - 146 mmol/L   Potassium 4.5 3.5 - 5.3 mmol/L   Chloride 103 98 - 110 mmol/L   CO2 22 20 - 31 mmol/L   Glucose, Bld 96 65 - 99 mg/dL   BUN 21 7 - 25 mg/dL   Creat 0.89 0.60 - 0.93 mg/dL  Total Bilirubin 0.4 0.2 - 1.2 mg/dL   Alkaline Phosphatase 83 33 - 130 U/L   AST 22 10 - 35 U/L   ALT 19 6 - 29 U/L   Total Protein 7.8 6.1 - 8.1 g/dL   Albumin 4.1 3.6 - 5.1 g/dL   Calcium 9.4 8.6 - 10.4 mg/dL  Sedimentation Rate  Result Value Ref Range   Sed Rate 47 (H) 0 - 30 mm/hr  C-reactive protein  Result Value Ref Range   CRP <0.5 <0.60 mg/dL  POCT urinalysis dipstick  Result Value Ref Range   Color, UA yellow yellow   Clarity, UA clear clear   Glucose, UA negative negative   Bilirubin, UA negative negative   Ketones, POC UA negative negative   Spec Grav, UA 1.015    Blood, UA trace-intact (A) negative   pH, UA 6.5    Protein Ur, POC negative negative   Urobilinogen, UA 0.2    Nitrite, UA Negative Negative   Leukocytes, UA Negative Negative  POCT Microscopic Urinalysis ([facility])  Result Value Ref Range   WBC,UR,HPF,POC None None WBC/hpf   RBC,UR,HPF,POC None None RBC/hpf   Bacteria None None, Too numerous to count   Mucus Absent Absent   Epithelial Cells, UR Per Microscopy None None, Too numerous to count cells/hpf  POCT CBC  Result Value Ref Range   WBC 9.6 4.6 - 10.2 K/uL   Lymph, poc 2.9 0.6 - 3.4   POC LYMPH PERCENT 30.0 10 - 50 %L   MID (cbc) 0.6 0 - 0.9   POC MID % 6.5 0 - 12 %M   POC Granulocyte 6.1 2 - 6.9   Granulocyte percent 63.5 37 - 80 %G   RBC 3.57 (A) 4.04 - 5.48 M/uL   Hemoglobin 11.3 (A) 12.2 - 16.2 g/dL   HCT, POC 33.3 (A) 37.7 - 47.9 %   MCV 93.0 80 - 97 fL   MCH, POC 31.7 (A) 27 -  31.2 pg   MCHC 34.1 31.8 - 35.4 g/dL   RDW, POC 13.6 %   Platelet Count, POC 262 142 - 424 K/uL   MPV 6.4 0 - 99.8 fL

## 2015-10-20 LAB — URINE CULTURE
COLONY COUNT: NO GROWTH
ORGANISM ID, BACTERIA: NO GROWTH

## 2015-10-20 LAB — SEDIMENTATION RATE: SED RATE: 47 mm/h — AB (ref 0–30)

## 2015-10-27 ENCOUNTER — Ambulatory Visit (INDEPENDENT_AMBULATORY_CARE_PROVIDER_SITE_OTHER): Payer: Medicare Other | Admitting: Physician Assistant

## 2015-10-27 VITALS — BP 142/78 | HR 97 | Temp 98.0°F | Resp 18 | Ht 59.0 in | Wt 103.4 lb

## 2015-10-27 DIAGNOSIS — G4700 Insomnia, unspecified: Secondary | ICD-10-CM

## 2015-10-27 DIAGNOSIS — L509 Urticaria, unspecified: Secondary | ICD-10-CM | POA: Diagnosis not present

## 2015-10-27 MED ORDER — MIRTAZAPINE 7.5 MG PO TABS: 7.5000 mg | ORAL_TABLET | Freq: Every day | ORAL | Status: DC

## 2015-10-27 MED ORDER — RANITIDINE HCL 150 MG PO TABS: 150.0000 mg | ORAL_TABLET | Freq: Two times a day (BID) | ORAL | Status: DC

## 2015-10-27 MED ORDER — LORATADINE 10 MG PO TABS
10.0000 mg | ORAL_TABLET | Freq: Every day | ORAL | Status: DC
Start: 2015-10-27 — End: 2015-11-30

## 2015-10-27 NOTE — Progress Notes (Signed)
Barbara Dennis  MRN: [id number] DOB: [birth year]  Subjective:  Pt presents to clinic with 2 days of itching under her left arm and on her buttocks.  She thinks it started about the time she ate at a [employer] but did not eat anything different than normal.  She has changed no soaps lotions or detergents.  She did not like the sleep medication that was started a few months ago and she only took a pill because it made her feel funny. She has been using the mycelex troches for the last week tid dosing.   Patient Active Problem List   Diagnosis Date Noted  . Depression with anxiety 01/06/2015  . Cervical adenopathy 03/31/2014  . 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 HTN, goal below 150/90 06/17/2013  . DJD (degenerative joint disease) 02/16/2013  . Tobacco user 09/23/2012  . Menopause 08/21/2012  . Postmenopausal atrophic vaginitis 08/21/2012    Current Outpatient Prescriptions on File Prior to Visit  Medication Sig Dispense Refill  . Albuterol Sulfate (PROAIR RESPICLICK) 123XX123 (90 Base) MCG/ACT AEPB Inhale 2 puffs into the lungs every 4 (four) hours as needed. 1 each 0  . clotrimazole (MYCELEX) 10 MG troche Take 1 tablet (10 mg total) by mouth 5 (five) times daily. 70 tablet 0  . lisinopril (PRINIVIL,ZESTRIL) 10 MG tablet Take 1 tablet (10 mg total) by mouth daily. 90 tablet 3  . nystatin (MYCOSTATIN) 100000 UNIT/ML suspension Take 5 mLs (500,000 Units total) by mouth 4 (four) times daily. Gargle and swish in mouth for as long as possible before swallowing 280 mL 1   No current facility-administered medications on file prior to visit.    No Known Allergies  Review of Systems  Skin: Positive for rash.   Objective:  BP 142/78 mmHg  Pulse 97  Temp(Src) 98 F (36.7 C) (Oral)  Resp 18  Ht 4\' 11"  (1.499 m)  Wt 103 lb 6.4 oz (46.902 kg)  BMI 20.87 kg/m2  SpO2 97%  Physical Exam  Constitutional: She is oriented to person, place, and time and well-developed,  well-nourished, and in no distress.  HENT:  Head: Normocephalic and atraumatic.  Right Ear: Hearing and external ear normal.  Left Ear: Hearing and external ear normal.  Eyes: Conjunctivae are normal.  Neck: Normal range of motion.  Pulmonary/Chest: Effort normal.  Neurological: She is alert and oriented to person, place, and time. Gait normal.  Skin: Skin is warm and dry. Rash (urticarial rash in left axilla and on buttocks) noted.  Psychiatric: Mood, memory, affect and judgment normal.  Vitals reviewed.   Assessment and Plan :  Insomnia - Plan: mirtazapine (REMERON) 7.5 MG tablet - we can try the remeron - we discussed that we have to be careful at her age with drowsy side effects but we will start at a low dose to see if she tolerates it  Hives - Plan: ranitidine (ZANTAC) 150 MG tablet, loratadine (CLARITIN) 10 MG tablet, Care order/instruction - unsure cause of her rash - she will stop the mycelex troches because they were started about a week ago - once the hives have resolved pt can resume taking them to see if that is the problem - if the hives return she will stop that and let us know to add her her allergies - it is a distribution that I have to wonder if she sat on something but we will treat the histamine response - she was instructed to watch hot baths as that will  make her more itchy.  Windell Hummingbird PA-C  [facility] 10/27/2015 3:57 PM

## 2015-10-27 NOTE — Patient Instructions (Signed)
Stop the mycelex trouches for a week to see if we can stop the itching and the rash and then once the rash and itching are gone you can restart the troches to help with the thrush.  We will treat the itching for a week  We started you on a different medication for your sleep issues

## 2015-10-30 ENCOUNTER — Encounter: Payer: Self-pay | Admitting: Family Medicine

## 2015-11-30 ENCOUNTER — Ambulatory Visit (INDEPENDENT_AMBULATORY_CARE_PROVIDER_SITE_OTHER): Payer: Medicare Other | Admitting: Family Medicine

## 2015-11-30 VITALS — BP 151/75 | HR 86 | Temp 97.8°F | Resp 16 | Ht 59.0 in | Wt 102.0 lb

## 2015-11-30 DIAGNOSIS — N390 Urinary tract infection, site not specified: Secondary | ICD-10-CM | POA: Diagnosis not present

## 2015-11-30 DIAGNOSIS — J441 Chronic obstructive pulmonary disease with (acute) exacerbation: Secondary | ICD-10-CM

## 2015-11-30 DIAGNOSIS — G4700 Insomnia, unspecified: Secondary | ICD-10-CM

## 2015-11-30 DIAGNOSIS — R1030 Lower abdominal pain, unspecified: Secondary | ICD-10-CM | POA: Diagnosis not present

## 2015-11-30 DIAGNOSIS — I10 Essential (primary) hypertension: Secondary | ICD-10-CM | POA: Diagnosis not present

## 2015-11-30 DIAGNOSIS — Z720 Tobacco use: Secondary | ICD-10-CM

## 2015-11-30 DIAGNOSIS — M79662 Pain in left lower leg: Secondary | ICD-10-CM | POA: Diagnosis not present

## 2015-11-30 DIAGNOSIS — M79661 Pain in right lower leg: Secondary | ICD-10-CM | POA: Diagnosis not present

## 2015-11-30 DIAGNOSIS — M2550 Pain in unspecified joint: Secondary | ICD-10-CM | POA: Diagnosis not present

## 2015-11-30 LAB — POC MICROSCOPIC URINALYSIS (UMFC): MUCUS RE: ABSENT

## 2015-11-30 LAB — POCT URINALYSIS DIP (MANUAL ENTRY)
BILIRUBIN UA: NEGATIVE
BILIRUBIN UA: NEGATIVE
GLUCOSE UA: NEGATIVE
Leukocytes, UA: NEGATIVE
Nitrite, UA: NEGATIVE
Protein Ur, POC: NEGATIVE
SPEC GRAV UA: 1.015
Urobilinogen, UA: 0.2
pH, UA: 7.5

## 2015-11-30 MED ORDER — CLOTRIMAZOLE 10 MG MT TROC: 10.0000 mg | Freq: Every day | OROMUCOSAL | Status: DC

## 2015-11-30 MED ORDER — AMOXICILLIN-POT CLAVULANATE 875-125 MG PO TABS: 1.0000 | ORAL_TABLET | Freq: Two times a day (BID) | ORAL | Status: DC

## 2015-11-30 NOTE — Progress Notes (Signed)
Subjective:    Patient ID: Barbara Dennis, female    DOB: 25-Feb-1934, 82 y.o.   MRN: [id number] Chief Complaint  Patient presents with  . Follow-up  . Medication Management    HPI  Barbara Dennis is a delightful 82 yo woman who I last saw 6 weeks prior.  Barbara Dennis speaks [place] which has lead to a fair amount of confusion with medication compliance as well as successfully interpreting her health concerns.  Because of this, she was asked to return today with a [place] health traslator andasked to bring all of her medications with her as I was not able to gauge which she was taking and not and she did not know the purpose of some of her medications.  Insomnia:  Barbara Dennis has been complaining of insomnia for quite some time but I had initially misunderstood this and thought she was c/o pain at night keeping her from sleep.  Last visit we started her on a trial of nortriptyline for this but she only tried it once because it "made her feel funny" so last mo my colleague started her on a trial of remeron 7.5mg  qhs which she also felt didn't work.  HTN:  On lisinpirl 10  Ongoing tobacco use with new diagnosis COPD: Smoked for 60 yrs - has cut down to 5 cigs/d.   vit D?  Had an urticarial reaction a week after our last visit- her only new medication were the mycelex trouches though could have been something she ate so was instructed to retry the antifungal after her hives had resolved.  She was never able to complete a full course of the nystatin or the mycelex as misunderstood directions  She forgot take her blood pressure medicine about 2 days of the week and she forgot to take her lisinopril 10mg  today.  She is coughing and it is worsening, it is productive of white sputum, no blood, slightly increased in amount. She denies pleuritic pain or DOE.  Past Medical History  Diagnosis Date  . Hypertension   . Mass of right side of neck    Past Surgical History  Procedure Laterality  Date  . Appendectomy     Current Outpatient Prescriptions on File Prior to Visit  Medication Sig Dispense Refill  . lisinopril (PRINIVIL,ZESTRIL) 10 MG tablet Take 1 tablet (10 mg total) by mouth daily. 90 tablet 3   No current facility-administered medications on file prior to visit.   No Known Allergies No family history on file. Social History   Social History  . Marital Status: Widowed    Spouse Name: N/A  . Number of Children: N/A  . Years of Education: N/A   Social History Main Topics  . Smoking status: Current Every Day Smoker -- 0.70 packs/day for 60 years    Types: Cigarettes  . Smokeless tobacco: Never Used  . Alcohol Use: No  . Drug Use: No  . Sexual Activity: Not on file   Other Topics Concern  . Not on file   Social History Narrative   Marital status: widowed 28 years ago.  From [place]; moved to [place] 1974.     Children: none       Lives:  Alone; brother in law is Psychologist, sport and exercise who is 21.      Employment: retired.       Tobacco:  1/2 ppd       Alcohol:  None      Exercise:  Walking daily.  ADLs: no driving.     Review of Systems  Constitutional: Positive for fatigue. Negative for chills, activity change, appetite change and unexpected weight change.  Respiratory: Positive for cough. Negative for chest tightness, shortness of breath and wheezing.   Gastrointestinal: Positive for abdominal pain.  Musculoskeletal: Positive for joint swelling and arthralgias.  Skin: Positive for rash.  Neurological: Positive for headaches.  Psychiatric/Behavioral: Positive for confusion and sleep disturbance.       Objective:  BP 151/75 mmHg  Pulse 86  Temp(Src) 97.8 F (36.6 C)  Resp 16  Ht 4\' 11"  (1.499 m)  Wt 102 lb (46.267 kg)  BMI 20.59 kg/m2  Physical Exam  Constitutional: She is oriented to person, place, and time. She appears well-developed and well-nourished. No distress.  HENT:  Head: Normocephalic and atraumatic.  Right Ear: External ear normal.  Left  Ear: External ear normal.  Eyes: Conjunctivae are normal. No scleral icterus.  Neck: Normal range of motion. Neck supple. No thyromegaly present.  Cardiovascular: Normal rate, regular rhythm, normal heart sounds and intact distal pulses.   Pulmonary/Chest: Effort normal and breath sounds normal. No respiratory distress.  Musculoskeletal: She exhibits no edema.  Lymphadenopathy:    She has no cervical adenopathy.  Neurological: She is alert and oriented to person, place, and time.  Skin: Skin is warm and dry. She is not diaphoretic. No erythema.  Psychiatric: She has a normal mood and affect. Her behavior is normal.      Results for orders placed or performed in visit on 11/30/15  Urine culture  Result Value Ref Range   Colony Count 8,000 COLONIES/ML    Organism ID, Bacteria Insignificant Growth   POCT urinalysis dipstick  Result Value Ref Range   Color, UA yellow yellow   Clarity, UA clear clear   Glucose, UA negative negative   Bilirubin, UA negative negative   Ketones, POC UA negative negative   Spec Grav, UA 1.015    Blood, UA trace-intact (A) negative   pH, UA 7.5    Protein Ur, POC negative negative   Urobilinogen, UA 0.2    Nitrite, UA Negative Negative   Leukocytes, UA Negative Negative  POCT Microscopic Urinalysis ([facility])  Result Value Ref Range   WBC,UR,HPF,POC None None WBC/hpf   RBC,UR,HPF,POC None None RBC/hpf   Bacteria None None, Too numerous to count   Mucus Absent Absent   Epithelial Cells, UR Per Microscopy None None, Too numerous to count cells/hpf    Assessment & Plan:  still smoking - unfortunately, i think she has aged out of screening lung CT program Has had asymptomatic UTI so rec checking UA freq Chronic stable anemia - likely genetic but cons checking iron, folate, b12 to r/o treatable etiology with next routine labs as had not had prior Language level caveat  Needs a spacer with her inhaler  1. Lower abdominal pain   2. Recurrent UTI   3.  COPD exacerbation ([place])   4. Bilateral calf pain   5. Arthralgia   6. Insomnia   7. Tobacco user   8. HTN, goal below 150/90     Orders Placed This Encounter  Procedures  . Urine culture  . POCT urinalysis dipstick  . POCT Microscopic Urinalysis ([facility])    Meds ordered this encounter  Medications  . DISCONTD: clotrimazole (MYCELEX) 10 MG troche    Sig: Take 1 tablet (10 mg total) by mouth 5 (five) times daily.    Dispense:  70 tablet  Refill:  0  . amoxicillin-clavulanate (AUGMENTIN) 875-125 MG tablet    Sig: Take 1 tablet by mouth 2 (two) times daily.    Dispense:  14 tablet    Refill:  0     Delman Cheadle, MD MPH

## 2015-11-30 NOTE — Patient Instructions (Addendum)
Barbara Dennis is now offering annual lung cancer screening by low-dose CT scan.  This is covered for qualifying patients and your insurance will be checked before the procedure.  Call the lung cancer screening nurse navigators at [phone] to learn more about this and get scheduled.   Try to use some TUMS at night instead of other medicine for heartburn - this may help with your calf pain. Try to drink much more water several times a day.   Thrush, Adult  [name] is an infection that can happen on the mouth, throat, tongue, or other areas. It causes white patches to form on the mouth and tongue. HOME CARE  Only take medicine as told by your doctor. You may be given medicine to swallow or to apply right on the area.  Eat plain yogurt that contains live cultures (check the label).  Rinse your mouth many times a day with a warm saltwater rinse. To make the rinse, mix 1 teaspoon (6 g) of salt in 8 ounces (0.2 L) of warm water. To reduce pain:  Drink cold liquids such as water or iced tea.  Eat frozen ice pops or frozen juices.  Eat foods that are easy to swallow, such as gelatin or ice cream.  Drink from a straw if the patches are painful. If you are breastfeeding:  Clean your nipples with an antifungal medicine.  Dry your nipples after breastfeeding.  Use an ointment called lanolin to help relieve nipple soreness. If you wear dentures:  Take out your dentures before going to bed.  Brush them thoroughly.  Soak them in a denture cleaner. GET HELP IF:   Your problems are getting worse.  Your problems are not improving within 7 days of starting treatment.  Your infection is spreading. This may show as white patches on the skin outside of your mouth.  You are nursing and have redness and pain in the nipples. MAKE SURE YOU:  Understand these instructions.  Will watch your condition.  Will get help right away if you are not doing well or get worse.   This information is  not intended to replace advice given to you by your health care provider. Make sure you discuss any questions you have with your health care provider.   Document Released: 01/01/2010 Document Revised: 07/28/2013 Document Reviewed: 05/10/2013 Elsevier Interactive Patient Education [employer].

## 2015-12-01 LAB — URINE CULTURE: Colony Count: 8000

## 2015-12-07 ENCOUNTER — Encounter: Payer: Self-pay | Admitting: Family Medicine

## 2015-12-07 ENCOUNTER — Ambulatory Visit (INDEPENDENT_AMBULATORY_CARE_PROVIDER_SITE_OTHER): Payer: Medicare Other | Admitting: Family Medicine

## 2015-12-07 VITALS — BP 105/65 | HR 90 | Temp 97.9°F | Resp 16 | Ht 59.0 in | Wt 104.0 lb

## 2015-12-07 DIAGNOSIS — R202 Paresthesia of skin: Secondary | ICD-10-CM

## 2015-12-07 DIAGNOSIS — R300 Dysuria: Secondary | ICD-10-CM | POA: Diagnosis not present

## 2015-12-07 DIAGNOSIS — M79605 Pain in left leg: Secondary | ICD-10-CM

## 2015-12-07 DIAGNOSIS — R200 Anesthesia of skin: Secondary | ICD-10-CM

## 2015-12-07 LAB — POCT URINALYSIS DIP (MANUAL ENTRY)
Bilirubin, UA: NEGATIVE
Glucose, UA: NEGATIVE
Leukocytes, UA: NEGATIVE
NITRITE UA: NEGATIVE
PH UA: 5.5
Protein Ur, POC: NEGATIVE
RBC UA: NEGATIVE
SPEC GRAV UA: 1.02
UROBILINOGEN UA: 0.2

## 2015-12-07 LAB — POC MICROSCOPIC URINALYSIS (UMFC): Mucus: ABSENT

## 2015-12-07 NOTE — Progress Notes (Signed)
Subjective:    Patient ID: Barbara Dennis, female    DOB: 08/06/1934, 82 y.o.   MRN: [id number]  HPI This is a pleasant 82 yo female who presents today for follow up of bladder pressure. She is accompanied by her friend, Debroah Baller. She was seen 11/30/15 and was given augmentin x 7 days. Culture was negative for did UTI. She does not have any incontinence, dysuria, frequency, abdominal pain, nausea or vomiting, she complains of having cramping, but I am unable to discern if it is prior to voiding or after voiding. She drinks about 2-3 glasses of water daily. She has 1 cup of coffee in the morning. Small volumes of urine. She is asking for a "stronger antibiotic."  She also has left leg pain from knee to foot. This has been going on for several months. She requests a referral to Dr. Micheline Chapman. She takes tylenol occasionally without relief. She is having some numbness and tingling in her foot.   It is very difficult to get clear history from patient. She did not bring in her medications today. She reports that her breathing is good. She is smoking about 5 cigarettes a day, down from high of 1 ppd. Her friend who is accompanying her today reports the patient is very lonely. She lives alone, has been widowed for 30 years. The patient walks about 30 minutes a day, plays cards and talks on the phone.   Past Medical History  Diagnosis Date  . Hypertension   . Mass of right side of neck    Past Surgical History  Procedure Laterality Date  . Appendectomy     No family history on file. Social History  Substance Use Topics  . Smoking status: Current Every Day Smoker -- 0.70 packs/day for 60 years    Types: Cigarettes  . Smokeless tobacco: Never Used  . Alcohol Use: No      Review of Systems No chest pain or SOB, no diarrhea or constipation.     Objective:   Physical Exam  Constitutional: She appears well-developed and well-nourished. No distress.  thin  HENT:  Head: Normocephalic and  atraumatic.  Eyes: Conjunctivae are normal.  Neck: Normal range of motion. Neck supple.  Cardiovascular: Normal rate, regular rhythm and normal heart sounds.   Pulmonary/Chest: Effort normal and breath sounds normal.  Abdominal: Soft. Bowel sounds are normal. She exhibits no distension and no mass. There is no tenderness. There is no rebound and no guarding.  Genitourinary: Vagina normal. Pelvic exam was performed with patient supine. There is no rash, tenderness, lesion or injury on the right labia. There is no rash, tenderness, lesion or injury on the left labia. No tenderness in the vagina. No vaginal discharge found.  Internal exam normal, no prolapse.   Musculoskeletal:  No pain with palpation of back, good ROM. Left knee unremarkable.   Neurological: She is alert.  Skin: Skin is warm and dry. She is not diaphoretic.  Psychiatric: She has a normal mood and affect. Her behavior is normal.  Vitals reviewed.  BP 105/65 mmHg  Pulse 90  Temp(Src) 97.9 F (36.6 C)  Resp 16  Ht 4\' 11"  (1.499 m)  Wt 104 lb (47.174 kg)  BMI 20.99 kg/m2 Wt Readings from Last 3 Encounters:  12/07/15 104 lb (47.174 kg)  11/30/15 102 lb (46.267 kg)  10/27/15 103 lb 6.4 oz (46.902 kg)   Results for orders placed or performed in visit on 12/07/15  POCT urinalysis dipstick  Result  Value Ref Range   Color, UA yellow yellow   Clarity, UA clear clear   Glucose, UA negative negative   Bilirubin, UA negative negative   Ketones, POC UA trace (5) (A) negative   Spec Grav, UA 1.020    Blood, UA negative negative   pH, UA 5.5    Protein Ur, POC negative negative   Urobilinogen, UA 0.2    Nitrite, UA Negative Negative   Leukocytes, UA Negative Negative  POCT Microscopic Urinalysis ([facility])  Result Value Ref Range   WBC,UR,HPF,POC None None WBC/hpf   RBC,UR,HPF,POC None None RBC/hpf   Bacteria None None, Too numerous to count   Mucus Absent Absent   Epithelial Cells, UR Per Microscopy None None, Too numerous  to count cells/hpf      Assessment & Plan:  1. Dysuria/cramping with voiding - her urine is unremarkable and culture from last week was negative - encouraged her to increase her fluids - POCT urinalysis dipstick - POCT Microscopic Urinalysis ([facility])  2. Left leg pain - She has requested referral to Dr. Micheline Chapman at [place].  - Ambulatory referral to Orthopedic Surgery  3. Numbness and tingling of left leg - Ambulatory referral to Orthopedic Surgery  - she has follow up in 3 weeks scheduled, will discuss symptoms at that time  Clarene Reamer, FNP-BC  [facility] [place] [place] [facility]  12/08/2015 10:54 AM

## 2015-12-07 NOTE — Patient Instructions (Addendum)
Please drink 6 glasses of water a day  We will call you with an appointment with Dr. Micheline Chapman for your leg pain.

## 2015-12-19 ENCOUNTER — Encounter: Payer: Self-pay | Admitting: Family Medicine

## 2015-12-19 ENCOUNTER — Ambulatory Visit (INDEPENDENT_AMBULATORY_CARE_PROVIDER_SITE_OTHER): Payer: Medicare Other | Admitting: Family Medicine

## 2015-12-19 VITALS — BP 145/68 | HR 102 | Ht 59.0 in | Wt 104.0 lb

## 2015-12-19 DIAGNOSIS — G609 Hereditary and idiopathic neuropathy, unspecified: Secondary | ICD-10-CM

## 2015-12-19 MED ORDER — GABAPENTIN 100 MG PO CAPS: ORAL_CAPSULE | ORAL | Status: DC

## 2015-12-19 NOTE — Progress Notes (Signed)
Patient ID: Barbara Dennis, female   DOB: 1933-11-21, 82 y.o.   MRN: [id number]  [place] provided by phone. [phone]

## 2015-12-22 NOTE — Progress Notes (Signed)
  JAYLIANIS DURTSCHI - 82 y.o. female MRN [id number]  Date of birth: 07-23-1934  CC: B/l lower extremity pain  SUBJECTIVE:   HPI  Ms. Bohan is a very pleasant 82 year old female who is here with her friend presenting for bilateral lower extremity burning from her knees distally. This is been ongoing for some time, she estimates for the majority of the last year.  She has not tried any oral medications. No back pain. No LE weakness. She has no blood sugar problems.  This does bother her at night. It is relatively equal bilaterally.  No bowel/bladder dysfunction. Denies any specific injury.  Sharp burning, 7/10 in intensity. 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  No other burning sensation.   The interpreter phone line was used for the majority of the visit.   ROS:     As above, denies fevers chills night sweats. Denies any new rashes or joint swelling.  HISTORY: Past Medical, Surgical, Social, and Family History Reviewed & Updated per EMR.  Pertinent Historical Findings include: HTN, depression, smoker  OBJECTIVE: BP 145/68 mmHg  Pulse 102  Ht 4\' 11"  (1.499 m)  Wt 104 lb (47.174 kg)  BMI 20.99 kg/m2  Physical Exam  Calm, nad Non-labored breathing  Knee:  b/l Normal to inspection with no erythema or effusion or obvious bony abnormalities. Palpation normal with no warmth or joint line tenderness or patellar tenderness or condyle tenderness. ROM normal in flexion and extension and lower leg rotation. Negative Mcmurray's and provocative meniscal tests. Non painful patellar compression. Patellar and quadriceps tendons unremarkable. Hamstring and quadriceps strength is normal.   2+ distal pulses, DP. No edema Abnormal sensation of anteromedial lower leg wrapping around to the sole of her foot.  Negative Supine & seated SRL.   Sensation abnormal b/l.    MEDICATIONS, LABS & OTHER ORDERS: Previous Medications   AMOXICILLIN-CLAVULANATE (AUGMENTIN) 875-125 MG TABLET    Take 1 tablet by mouth 2 (two) times daily.   LISINOPRIL (PRINIVIL,ZESTRIL) 10 MG TABLET    Take 1 tablet (10 mg total) by mouth daily.   PROAIR HFA 108 (90 BASE) MCG/ACT INHALER    INL 2 PFS INTO THE LUNGS Q 4 H PRF WHZ OR SOB OR COUGH   Modified Medications   No medications on file   New Prescriptions   GABAPENTIN (NEURONTIN) 100 MG CAPSULE    Take 1 tablet at night for the first week, then you can add another tablet at night, for a total of 2 tablets.   Discontinued Medications   No medications on file  No orders of the defined types were placed in this encounter.   ASSESSMENT & PLAN: Neuropathy of unknown etiology: No back pain or red flag symptoms.  Will trial gabapentin 100mg  x 1 week then increase to 200mg  qHS in week 2.  F/u 3 weeks.  Will need [place] interpreter.  Hopefully will be able to get more details at the next visit. There was suggestion of working with chemicals in her past which may be a cause, although more detail is again required.  Call with questions

## 2015-12-27 ENCOUNTER — Ambulatory Visit (INDEPENDENT_AMBULATORY_CARE_PROVIDER_SITE_OTHER): Payer: Medicare Other | Admitting: Family Medicine

## 2015-12-27 VITALS — BP 146/68 | HR 81 | Temp 98.0°F | Resp 16 | Ht 59.0 in | Wt 104.0 lb

## 2015-12-27 DIAGNOSIS — M791 Myalgia, unspecified site: Secondary | ICD-10-CM

## 2015-12-27 DIAGNOSIS — R300 Dysuria: Secondary | ICD-10-CM | POA: Diagnosis not present

## 2015-12-27 DIAGNOSIS — R059 Cough, unspecified: Secondary | ICD-10-CM

## 2015-12-27 DIAGNOSIS — R05 Cough: Secondary | ICD-10-CM

## 2015-12-27 LAB — POCT URINALYSIS DIP (MANUAL ENTRY)
BILIRUBIN UA: NEGATIVE
Glucose, UA: NEGATIVE
Ketones, POC UA: NEGATIVE
LEUKOCYTES UA: NEGATIVE
NITRITE UA: NEGATIVE
PH UA: 7
PROTEIN UA: NEGATIVE
Spec Grav, UA: 1.015
UROBILINOGEN UA: 0.2

## 2015-12-27 LAB — POCT INFLUENZA A/B
INFLUENZA A, POC: NEGATIVE
Influenza B, POC: NEGATIVE

## 2015-12-27 LAB — POC MICROSCOPIC URINALYSIS (UMFC): Mucus: ABSENT

## 2015-12-27 NOTE — Progress Notes (Signed)
   Subjective:    Patient ID: Barbara Dennis, female    DOB: 06-30-1934, 82 y.o.   MRN: [id number]  HPI This is a pleasant 82 yo [place] female who is accompanied by a Optometrist. She presents today with 2 days of sore throat, runny nose (white drainage), chest congestion (productive of white mucus) and body aches. Some chills. No SOB, occasional wheeze. Not taking any medication for her symptoms. Not sleeping well chronically. Fatigued. Achy. Smokes 5 cigarettes a day.   Past Medical History  Diagnosis Date  . Hypertension   . Mass of right side of neck    Past Surgical History  Procedure Laterality Date  . Appendectomy     No family history on file. Social History  Substance Use Topics  . Smoking status: Current Every Day Smoker -- 0.70 packs/day for 60 years    Types: Cigarettes  . Smokeless tobacco: Never Used  . Alcohol Use: No    Review of Systems  Constitutional: Positive for fever (subjective) and fatigue.  HENT: Positive for congestion and rhinorrhea.   Respiratory: Positive for cough. Negative for chest tightness, shortness of breath and wheezing.   Cardiovascular: Negative for chest pain and leg swelling.  Psychiatric/Behavioral: Positive for sleep disturbance (chronic, no worse).      Objective:   Physical Exam Physical Exam  Constitutional: Oriented to person, place, and time. She appears well-developed and well-nourished.  HENT:  Head: Normocephalic and atraumatic.  Eyes: Conjunctivae are normal.  Neck: Normal range of motion. Neck supple.  Cardiovascular: Normal rate, regular rhythm and normal heart sounds.   Pulmonary/Chest: Effort normal and breath sounds normal.  Musculoskeletal: Normal range of motion.  Neurological: Alert and oriented to person, place, and time.  Skin: Skin is warm and dry.  Psychiatric: Normal mood and affect. Behavior is normal. Judgment and thought content normal.  Vitals reviewed.   BP 146/68 mmHg  Pulse 81  Temp(Src) 98 F  (36.7 C) (Oral)  Resp 16  Ht 4\' 11"  (1.499 m)  Wt 104 lb (47.174 kg)  BMI 20.99 kg/m2  SpO2 97% Wt Readings from Last 3 Encounters:  12/27/15 104 lb (47.174 kg)  12/19/15 104 lb (47.174 kg)  12/07/15 104 lb (47.174 kg)      Assessment & Plan:  1. Dysuria - POCT urinalysis dipstick- negative - POCT Microscopic Urinalysis ([facility])- negative  2. Myalgia - POCT Influenza A/B- negative  3. Cough - POCT Influenza A/B- negative - suspect viral illness, provided instructions for symptomatic relief and RTC precautions  - follow up as scheduled 5/17 with Dr. Jaquelyn Bitter, FNP-BC  [facility] [place] [facility]  12/29/2015 8:56 PM

## 2015-12-27 NOTE — Progress Notes (Signed)
   Subjective:    Patient ID: [name], female    DOB: May 23, 1934, 82 y.o.   MRN: [id number]  HPI  This is a pleasant 82 year old female that presents today with nasal congestion, sore throat, and body aches x 2 days. Pt states that she is having clear nasal drainage and clear sputum. Reports chills. Denies SOB or ear pain. She has not taken anything for her symptoms and is not sleeping well.  Past Medical History  Diagnosis Date  . Hypertension   . Mass of right side of neck    No family history on file. Social History   Social History  . Marital Status: Widowed    Spouse Name: N/A  . Number of Children: N/A  . Years of Education: N/A   Occupational History  . Not on file.   Social History Main Topics  . Smoking status: Current Every Day Smoker -- 0.70 packs/day for 60 years    Types: Cigarettes  . Smokeless tobacco: Never Used  . Alcohol Use: No  . Drug Use: No  . Sexual Activity: Not on file   Other Topics Concern  . Not on file   Social History Narrative   Marital status: widowed 28 years ago.  From [place]; moved to [place] 1974.     Children: none       Lives:  Alone; brother in law is Psychologist, sport and exercise who is 15.      Employment: retired.       Tobacco:  1/2 ppd       Alcohol:  None      Exercise:  Walking daily.      ADLs: no driving.    Review of Systems  Constitutional: Positive for chills and fatigue.  HENT: Positive for congestion, rhinorrhea (clear) and sore throat. Negative for ear pain.   Respiratory: Positive for cough (clear sputum) and wheezing (intermittent). Negative for chest tightness.   Cardiovascular: Negative for chest pain.  Musculoskeletal: Positive for myalgias.  Neurological: Positive for weakness.  Psychiatric/Behavioral: Positive for sleep disturbance (not sleeping too well).       Objective:   Physical Exam  Completed by Tor Netters  BP 146/68 mmHg  Pulse 81  Temp(Src) 98 F (36.7 C) (Oral)  Resp 16  Ht 4\' 11"  (1.499 m)  Wt  104 lb (47.174 kg)  BMI 20.99 kg/m2  SpO2 97% Wt Readings from Last 3 Encounters:  12/27/15 104 lb (47.174 kg)  12/19/15 104 lb (47.174 kg)  12/07/15 104 lb (47.174 kg)   Temp Readings from Last 3 Encounters:  12/27/15 98 F (36.7 C) Oral  12/07/15 97.9 F (36.6 C)   11/30/15 97.8 F (36.6 C)    BP Readings from Last 3 Encounters:  12/27/15 146/68  12/19/15 145/68  12/07/15 105/65   Pulse Readings from Last 3 Encounters:  12/27/15 81  12/19/15 102  12/07/15 90       Assessment & Plan:  1. Dysuria - POCT urinalysis dipstick - POCT Microscopic Urinalysis ([facility])  2. Myalgia - POCT Influenza A/B  3. Cough - POCT Influenza A/B -delsym -Mucinex given   Steffanie Dunn, FNP-student  [facility]  12/27/2015 5:27 PM

## 2015-12-27 NOTE — Patient Instructions (Signed)
Your flu test today is negative Please drink 6-8 glasses of fluids every day and try the medicine I gave you for cough.  Rest when you are tired. Please try to walk every day when you are feeling better.

## 2016-02-22 ENCOUNTER — Encounter: Payer: Self-pay | Admitting: Family Medicine

## 2016-02-22 ENCOUNTER — Ambulatory Visit (INDEPENDENT_AMBULATORY_CARE_PROVIDER_SITE_OTHER): Payer: Medicare Other | Admitting: Family Medicine

## 2016-02-22 VITALS — BP 130/58 | HR 86 | Temp 98.0°F | Resp 16 | Ht 58.25 in | Wt 101.2 lb

## 2016-02-22 DIAGNOSIS — N390 Urinary tract infection, site not specified: Secondary | ICD-10-CM

## 2016-02-22 DIAGNOSIS — Z720 Tobacco use: Secondary | ICD-10-CM

## 2016-02-22 DIAGNOSIS — M1990 Unspecified osteoarthritis, unspecified site: Secondary | ICD-10-CM

## 2016-02-22 DIAGNOSIS — R2689 Other abnormalities of gait and mobility: Secondary | ICD-10-CM

## 2016-02-22 DIAGNOSIS — I10 Essential (primary) hypertension: Secondary | ICD-10-CM

## 2016-02-22 LAB — POCT URINALYSIS DIP (MANUAL ENTRY)
BILIRUBIN UA: NEGATIVE
BILIRUBIN UA: NEGATIVE
GLUCOSE UA: NEGATIVE
Leukocytes, UA: NEGATIVE
Nitrite, UA: NEGATIVE
Protein Ur, POC: NEGATIVE
SPEC GRAV UA: 1.015
UROBILINOGEN UA: 0.2
pH, UA: 7

## 2016-02-22 LAB — POC MICROSCOPIC URINALYSIS (UMFC): MUCUS RE: ABSENT

## 2016-02-22 MED ORDER — MOVE FREE JOINT HEALTH ADVANCE PO TABS: 1.0000 | ORAL_TABLET | Freq: Two times a day (BID) | ORAL | Status: DC

## 2016-02-22 MED ORDER — TIZANIDINE HCL 2 MG PO TABS: 2.0000 mg | ORAL_TABLET | Freq: Every day | ORAL | Status: DC

## 2016-02-22 MED ORDER — CALCIUM CARBONATE-VITAMIN D3 600-400 MG-UNIT PO TABS: 1.0000 | ORAL_TABLET | Freq: Two times a day (BID) | ORAL | Status: DC

## 2016-02-22 MED ORDER — DM-GUAIFENESIN ER 30-600 MG PO TB12: 1.0000 | ORAL_TABLET | Freq: Two times a day (BID) | ORAL | Status: DC | PRN

## 2016-02-22 MED ORDER — ACETAMINOPHEN ER 650 MG PO TBCR: 650.0000 mg | EXTENDED_RELEASE_TABLET | Freq: Two times a day (BID) | ORAL | Status: DC

## 2016-02-22 MED ORDER — DICLOFENAC SODIUM 1 % TD GEL: 4.0000 g | Freq: Four times a day (QID) | TRANSDERMAL | Status: DC

## 2016-02-22 MED ORDER — RANITIDINE HCL 150 MG PO TABS: 150.0000 mg | ORAL_TABLET | Freq: Two times a day (BID) | ORAL | Status: DC | PRN

## 2016-02-22 NOTE — Patient Instructions (Addendum)
IF you received an x-ray today, you will receive an invoice from [facility]. Please contact [facility] at [phone] with questions or concerns regarding your invoice.   IF you received labwork today, you will receive an invoice from [employer]. Please contact [facility] at [phone] with questions or concerns regarding your invoice.   Our billing staff will not be able to assist you with questions regarding bills from these companies.  You will be contacted with the lab results as soon as they are available. The fastest way to get your results is to activate your My Chart account. Instructions are located on the last page of this paperwork. If you have not heard from [place] regarding the results in 2 weeks, please contact this office.    You can take plain Tylenol (acetaminophen) OR Tylenol arthritis twice a day   Arthritis Arthritis means joint pain. It can also mean joint disease. A joint is a place where bones come together. People who have arthritis may have:  Red joints.  Swollen joints.  Stiff joints.  Warm joints.  A fever.  A feeling of being sick. HOME CARE Pay attention to any changes in your symptoms. Take these actions to help with your pain and swelling. Medicines  Take over-the-counter and prescription medicines only as told by your doctor.  Do not take aspirin for pain if your doctor says that you may have gout. Activities  Rest your joint if your doctor tells you to.  Avoid activities that make the pain worse.  Exercise your joint regularly as told by your doctor. Try doing exercises like:  Swimming.  Water aerobics.  Biking.  Walking. Joint Care  If your joint is swollen, keep it raised (elevated) if told by your doctor.  If your joint feels stiff in the morning, try taking a warm shower.  If you have diabetes, do not apply heat without asking your doctor.  If told, apply heat to the  joint:  Put a towel between the joint and the hot pack or heating pad.  Leave the heat on the area for 20-30 minutes.  If told, apply ice to the joint:  Put ice in a plastic bag.  Place a towel between your skin and the bag.  Leave the ice on for 20 minutes, 2-3 times per day.  Keep all follow-up visits as told by your doctor. GET HELP IF:  The pain gets worse.  You have a fever. GET HELP RIGHT AWAY IF:  You have very bad pain in your joint.  You have swelling in your joint.  Your joint is red.  Many joints become painful and swollen.  You have very bad back pain.  Your leg is very weak.  You cannot control your pee (urine) or poop (stool).   This information is not intended to replace advice given to you by your health care provider. Make sure you discuss any questions you have with your health care provider.   Document Released: 01/01/2010 Document Revised: 06/28/2015 Document Reviewed: 01/02/2015 Elsevier Interactive Patient Education 2016 Elsevier Inc.   Osteoarthritis Osteoarthritis is a disease that causes soreness and inflammation of a joint. It occurs when the cartilage at the affected joint wears down. Cartilage acts as a cushion, covering the ends of bones where they meet to form a joint. Osteoarthritis is the most common form of arthritis. It often occurs in older people. The joints affected most often by this condition include those in the:  Ends of the fingers.  Thumbs.  Neck.  Lower back.  Knees.  Hips. CAUSES  Over time, the cartilage that covers the ends of bones begins to wear away. This causes bone to rub on bone, producing pain and stiffness in the affected joints.  RISK FACTORS Certain factors can increase your chances of having osteoarthritis, including:  Older age.  Excessive body weight.  Overuse of joints.  Previous joint injury. SIGNS AND SYMPTOMS   Pain, swelling, and stiffness in the joint.  Over time, the joint may  lose its normal shape.  Small deposits of bone (osteophytes) may grow on the edges of the joint.  Bits of bone or cartilage can break off and float inside the joint space. This may cause more pain and damage. DIAGNOSIS  Your health care provider will do a physical exam and ask about your symptoms. Various tests may be ordered, such as:  X-rays of the affected joint.  Blood tests to rule out other types of arthritis. Additional tests may be used to diagnose your condition. TREATMENT  Goals of treatment are to control pain and improve joint function. Treatment plans may include:  A prescribed exercise program that allows for rest and joint relief.  A weight control plan.  Pain relief techniques, such as:  Properly applied heat and cold.  Electric pulses delivered to nerve endings under the skin (transcutaneous electrical nerve stimulation [TENS]).  Massage.  Certain nutritional supplements.  Medicines to control pain, such as:  Acetaminophen.  Nonsteroidal anti-inflammatory drugs (NSAIDs), such as naproxen.  Narcotic or central-acting agents, such as tramadol.  Corticosteroids. These can be given orally or as an injection.  Surgery to reposition the bones and relieve pain (osteotomy) or to remove loose pieces of bone and cartilage. Joint replacement may be needed in advanced states of osteoarthritis. HOME CARE INSTRUCTIONS   Take medicines only as directed by your health care provider.  Maintain a healthy weight. Follow your health care provider's instructions for weight control. This may include dietary instructions.  Exercise as directed. Your health care provider can recommend specific types of exercise. These may include:  Strengthening exercises. These are done to strengthen the muscles that support joints affected by arthritis. They can be performed with weights or with exercise bands to add resistance.  Aerobic activities. These are exercises, such as brisk  walking or low-impact aerobics, that get your heart pumping.  Range-of-motion activities. These keep your joints limber.  Balance and agility exercises. These help you maintain daily living skills.  Rest your affected joints as directed by your health care provider.  Keep all follow-up visits as directed by your health care provider. SEEK MEDICAL CARE IF:   Your skin turns red.  You develop a rash in addition to your joint pain.  You have worsening joint pain.  You have a fever along with joint or muscle aches. SEEK IMMEDIATE MEDICAL CARE IF:  You have a significant loss of weight or appetite.  You have night sweats. [place] of Arthritis and Musculoskeletal and Skin Diseases: www.niams.SouthExposed.es  [employer] on Aging: http://kim-miller.com/  American College of Rheumatology: www.rheumatology.org   This information is not intended to replace advice given to you by your health care provider. Make sure you discuss any questions you have with your health care provider.   Document Released: 10/07/2005 Document Revised: 10/28/2014 Document Reviewed: 06/14/2013 Elsevier Interactive Patient Education [employer].

## 2016-02-22 NOTE — Progress Notes (Signed)
Subjective:    Patient ID: Barbara Dennis, female    DOB: 02/22/1934, 82 y.o.   MRN: [id number] Chief Complaint  Patient presents with  . Follow-up  . Hypertension  . right index finger    hurting x 2 mos, feels better in "hot water"  . cramps in lower leg    x1 week, but going on for a year, pt states worse in the am  . Medication Refill    Lisinopril 10 mg  . walking body sore all over    don't sleep good at night    HPI  Pt here with [place] interpretor.  Forgot to bring her medications with her again.  Taking a yellow bp once a day.  Is taking a stomach medicine that is over-the-counter-  - unknown med for indigestion.  And is taking tylenol 2 pills a day at varying times to treat headache and so she can sleep.  She does sleep after she takes a tylenol - X which she does most nhights.  Tob use: 5 cigs/d. States she  Is cough a lot but no sig SHoB or DOE Cramping in both lower legs and hurts all over.    Balance off  Her finger - right index hurting her a lot though both hands hard hurting.   Past Medical History  Diagnosis Date  . Hypertension   . Mass of right side of neck    Past Surgical History  Procedure Laterality Date  . Appendectomy     Current Outpatient Prescriptions on File Prior to Visit  Medication Sig Dispense Refill  . lisinopril (PRINIVIL,ZESTRIL) 10 MG tablet Take 1 tablet (10 mg total) by mouth daily. 90 tablet 3  . PROAIR HFA 108 (90 Base) MCG/ACT inhaler INL 2 PFS INTO THE LUNGS Q 4 H PRF WHZ OR SOB OR COUGH  1   No current facility-administered medications on file prior to visit.   No Known Allergies No family history on file. Social History   Social History  . Marital Status: Widowed    Spouse Name: N/A  . Number of Children: N/A  . Years of Education: N/A   Social History Main Topics  . Smoking status: Current Every Day Smoker -- 0.70 packs/day for 60 years    Types: Cigarettes  . Smokeless tobacco: Never Used  . Alcohol Use: No  .  Drug Use: No  . Sexual Activity: Not Asked   Other Topics Concern  . None   Social History Narrative   Marital status: widowed 28 years ago.  From [place]; moved to [place] 1974.     Children: none       Lives:  Alone; brother in law is Psychologist, sport and exercise who is 29.      Employment: retired.       Tobacco:  1/2 ppd       Alcohol:  None      Exercise:  Walking daily.      ADLs: no driving.     Review of Systems  Constitutional: Positive for activity change. Negative for fever, chills and unexpected weight change.  Respiratory: Positive for cough. Negative for chest tightness, shortness of breath and wheezing.   Gastrointestinal: Positive for abdominal pain and abdominal distention. Negative for vomiting.  Musculoskeletal: Positive for myalgias, back pain, joint swelling, arthralgias and gait problem.  Skin: Negative for color change and rash.  Neurological: Positive for light-headedness and headaches. Negative for facial asymmetry, weakness and numbness.  Psychiatric/Behavioral: Positive for sleep disturbance.  Objective:  BP 130/58 mmHg  Pulse 86  Temp(Src) 98 F (36.7 C) (Oral)  Resp 16  Ht 4' 10.25" (1.48 m)  Wt 101 lb 3.2 oz (45.904 kg)  BMI 20.96 kg/m2  SpO2 97%  Physical Exam  Constitutional: She is oriented to person, place, and time. She appears well-developed and well-nourished. No distress.  HENT:  Head: Normocephalic and atraumatic.  Right Ear: External ear normal.  Left Ear: External ear normal.  Eyes: Conjunctivae are normal. No scleral icterus.  Neck: Normal range of motion. Neck supple. No thyromegaly present.  Cardiovascular: Normal rate, regular rhythm, normal heart sounds and intact distal pulses.   Pulmonary/Chest: Effort normal and breath sounds normal. No respiratory distress.  Musculoskeletal: She exhibits no edema.  Lymphadenopathy:    She has no cervical adenopathy.  Neurological: She is alert and oriented to person, place, and time.  Skin: Skin is warm  and dry. She is not diaphoretic. No erythema.  Psychiatric: She has a normal mood and affect. Her behavior is normal.       Results for orders placed or performed in visit on 02/22/16  Urine culture  Result Value Ref Range   Colony Count NO GROWTH    Organism ID, Bacteria NO GROWTH   POCT urinalysis dipstick  Result Value Ref Range   Color, UA yellow yellow   Clarity, UA clear clear   Glucose, UA negative negative   Bilirubin, UA negative negative   Ketones, POC UA negative negative   Spec Grav, UA 1.015    Blood, UA trace-intact (A) negative   pH, UA 7.0    Protein Ur, POC negative negative   Urobilinogen, UA 0.2    Nitrite, UA Negative Negative   Leukocytes, UA Negative Negative  POCT Microscopic Urinalysis ([facility])  Result Value Ref Range   WBC,UR,HPF,POC None None WBC/hpf   RBC,UR,HPF,POC None None RBC/hpf   Bacteria None None, Too numerous to count   Mucus Absent Absent   Epithelial Cells, UR Per Microscopy Few (A) None, Too numerous to count cells/hpf    Assessment & Plan:   1. HTN, goal below 150/90   2. Tobacco user   3. Recurrent UTI   4. Imbalance   5. Osteoarthritis, unspecified osteoarthritis type, unspecified site   ok to cont tylenol. Needs to bring all meds to f/u visit.  Orders Placed This Encounter  Procedures  . Urine culture  . POCT urinalysis dipstick  . POCT Microscopic Urinalysis ([facility])    Meds ordered this encounter  Medications  . diclofenac sodium (VOLTAREN) 1 % GEL    Sig: Apply 4 g topically 4 (four) times daily.    Dispense:  100 g    Refill:  2  . tiZANidine (ZANAFLEX) 2 MG tablet    Sig: Take 1 tablet (2 mg total) by mouth at bedtime.    Dispense:  30 tablet    Refill:  2    Please show pt everything and explain its use to her in detail - large [place] language barrier  . acetaminophen (TYLENOL ARTHRITIS PAIN) 650 MG CR tablet    Sig: Take 1 tablet (650 mg total) by mouth every 12 (twelve) hours. For joint pain    Dispense:   180 tablet    Refill:  3  . Calcium Carbonate-Vitamin D3 (CALCIUM 600/VITAMIN D) 600-400 MG-UNIT TABS    Sig: Take 1 tablet by mouth 2 (two) times daily.    Dispense:  180 tablet    Refill:  3  .  ranitidine (ZANTAC) 150 MG tablet    Sig: Take 1 tablet (150 mg total) by mouth 2 (two) times daily as needed for heartburn (or indigestion).    Dispense:  180 tablet    Refill:  3  . dextromethorphan-guaiFENesin (MUCINEX DM) 30-600 MG 12hr tablet    Sig: Take 1 tablet by mouth 2 (two) times daily as needed for cough.    Dispense:  60 tablet    Refill:  2  . Glucos-Chond-Hyal Ac-Ca Fructo (MOVE FREE JOINT HEALTH ADVANCE) TABS    Sig: Take 1 tablet by mouth 2 (two) times daily. Or as instructed by packaging    Dispense:  60 tablet    Refill:  3    Language level caveat.   Delman Cheadle, M.D.  [facility] [address] [place], [place] [address] [phone] phone [phone] fax  03/19/2016 10:52 PM

## 2016-02-23 LAB — URINE CULTURE
COLONY COUNT: NO GROWTH
Organism ID, Bacteria: NO GROWTH

## 2016-02-25 ENCOUNTER — Encounter: Payer: Self-pay | Admitting: Family Medicine

## 2016-02-28 ENCOUNTER — Telehealth: Payer: Self-pay

## 2016-02-28 NOTE — Telephone Encounter (Signed)
Approved through 10/20/16

## 2016-02-28 NOTE — Telephone Encounter (Signed)
PA approved on covermymeds for diclofenac 1% gel. PA Case [id number] is Approved. Notified pharm.

## 2016-03-12 ENCOUNTER — Ambulatory Visit: Payer: Medicare Other | Admitting: Family Medicine

## 2016-03-13 ENCOUNTER — Ambulatory Visit: Payer: Medicare Other | Admitting: Family Medicine

## 2016-04-02 ENCOUNTER — Ambulatory Visit (INDEPENDENT_AMBULATORY_CARE_PROVIDER_SITE_OTHER): Payer: Medicare Other | Admitting: Family Medicine

## 2016-04-02 VITALS — BP 122/68 | HR 74 | Temp 97.9°F | Resp 18 | Ht 58.25 in | Wt 102.0 lb

## 2016-04-02 DIAGNOSIS — R9431 Abnormal electrocardiogram [ECG] [EKG]: Secondary | ICD-10-CM

## 2016-04-02 DIAGNOSIS — Z720 Tobacco use: Secondary | ICD-10-CM

## 2016-04-02 DIAGNOSIS — K219 Gastro-esophageal reflux disease without esophagitis: Secondary | ICD-10-CM | POA: Diagnosis not present

## 2016-04-02 DIAGNOSIS — B9681 Helicobacter pylori [H. pylori] as the cause of diseases classified elsewhere: Secondary | ICD-10-CM

## 2016-04-02 DIAGNOSIS — R21 Rash and other nonspecific skin eruption: Secondary | ICD-10-CM | POA: Diagnosis not present

## 2016-04-02 DIAGNOSIS — J449 Chronic obstructive pulmonary disease, unspecified: Secondary | ICD-10-CM

## 2016-04-02 DIAGNOSIS — R079 Chest pain, unspecified: Secondary | ICD-10-CM | POA: Diagnosis not present

## 2016-04-02 DIAGNOSIS — D649 Anemia, unspecified: Secondary | ICD-10-CM | POA: Diagnosis not present

## 2016-04-02 DIAGNOSIS — K2970 Gastritis, unspecified, without bleeding: Secondary | ICD-10-CM

## 2016-04-02 LAB — POCT CBC
GRANULOCYTE PERCENT: 61.3 % (ref 37–80)
HEMATOCRIT: 32.2 % — AB (ref 37.7–47.9)
HEMOGLOBIN: 11.5 g/dL — AB (ref 12.2–16.2)
Lymph, poc: 2.8 (ref 0.6–3.4)
MCH, POC: 33 pg — AB (ref 27–31.2)
MCHC: 35.7 g/dL — AB (ref 31.8–35.4)
MCV: 92.4 fL (ref 80–97)
MID (cbc): 0.8 (ref 0–0.9)
MPV: 6.4 fL (ref 0–99.8)
POC GRANULOCYTE: 5.7 (ref 2–6.9)
POC LYMPH PERCENT: 30.2 %L (ref 10–50)
POC MID %: 8.5 %M (ref 0–12)
Platelet Count, POC: 268 10*3/uL (ref 142–424)
RBC: 3.48 M/uL — AB (ref 4.04–5.48)
RDW, POC: 13.8 %
WBC: 9.3 10*3/uL (ref 4.6–10.2)

## 2016-04-02 LAB — COMPREHENSIVE METABOLIC PANEL
ALT: 17 U/L (ref 6–29)
AST: 17 U/L (ref 10–35)
Albumin: 4.1 g/dL (ref 3.6–5.1)
Alkaline Phosphatase: 78 U/L (ref 33–130)
BUN: 19 mg/dL (ref 7–25)
CHLORIDE: 99 mmol/L (ref 98–110)
CO2: 24 mmol/L (ref 20–31)
CREATININE: 0.98 mg/dL — AB (ref 0.60–0.88)
Calcium: 9.1 mg/dL (ref 8.6–10.4)
Glucose, Bld: 103 mg/dL — ABNORMAL HIGH (ref 65–99)
Potassium: 4.8 mmol/L (ref 3.5–5.3)
SODIUM: 131 mmol/L — AB (ref 135–146)
Total Bilirubin: 0.6 mg/dL (ref 0.2–1.2)
Total Protein: 7.4 g/dL (ref 6.1–8.1)

## 2016-04-02 LAB — LIPASE: Lipase: 59 U/L (ref 7–60)

## 2016-04-02 MED ORDER — OMEPRAZOLE 40 MG PO CPDR: 40.0000 mg | DELAYED_RELEASE_CAPSULE | Freq: Every day | ORAL | Status: DC

## 2016-04-02 MED ORDER — TRIAMCINOLONE 0.1 % CREAM:EUCERIN CREAM 1:1: 1.0000 "application " | TOPICAL_CREAM | Freq: Two times a day (BID) | CUTANEOUS | Status: DC | PRN

## 2016-04-02 NOTE — Patient Instructions (Addendum)
IF you received an x-ray today, you will receive an invoice from [facility]. Please contact [facility] at [phone] with questions or concerns regarding your invoice.   IF you received labwork today, you will receive an invoice from [employer]. Please contact [facility] at [phone] with questions or concerns regarding your invoice.   Our billing staff will not be able to assist you with questions regarding bills from these companies.  You will be contacted with the lab results as soon as they are available. The fastest way to get your results is to activate your My Chart account. Instructions are located on the last page of this paperwork. If you have not heard from [place] regarding the results in 2 weeks, please contact this office.    Food Choices for Gastroesophageal Reflux Disease, Adult When you have gastroesophageal reflux disease (GERD), the foods you eat and your eating habits are very important. Choosing the right foods can help ease the discomfort of GERD. WHAT GENERAL GUIDELINES DO I NEED TO FOLLOW?  Choose fruits, vegetables, whole grains, low-fat dairy products, and low-fat meat, fish, and poultry.  Limit fats such as oils, salad dressings, butter, nuts, and avocado.  Keep a food diary to identify foods that cause symptoms.  Avoid foods that cause reflux. These may be different for different people.  Eat frequent small meals instead of three large meals each day.  Eat your meals slowly, in a relaxed setting.  Limit fried foods.  Cook foods using methods other than frying.  Avoid drinking alcohol.  Avoid drinking large amounts of liquids with your meals.  Avoid bending over or lying down until 2-3 hours after eating. WHAT FOODS ARE NOT RECOMMENDED? The following are some foods and drinks that may worsen your symptoms: Vegetables Tomatoes. Tomato juice. Tomato and spaghetti sauce. Chili peppers. Onion and garlic.  Horseradish. Fruits Oranges, grapefruit, and lemon (fruit and juice). Meats High-fat meats, fish, and poultry. This includes hot dogs, ribs, ham, sausage, salami, and bacon. Dairy Whole milk and chocolate milk. Sour cream. Cream. Butter. Ice cream. Cream cheese.  Beverages Coffee and tea, with or without caffeine. Carbonated beverages or energy drinks. Condiments Hot sauce. Barbecue sauce.  Sweets/Desserts Chocolate and cocoa. Donuts. Peppermint and spearmint. Fats and Oils High-fat foods, including [place] fries and potato chips. Other Vinegar. Strong spices, such as black pepper, white pepper, red pepper, cayenne, curry powder, cloves, ginger, and chili powder. The items listed above may not be a complete list of foods and beverages to avoid. Contact your dietitian for more information.   This information is not intended to replace advice given to you by your health care provider. Make sure you discuss any questions you have with your health care provider.   Document Released: 10/07/2005 Document Revised: 10/28/2014 Document Reviewed: 08/11/2013 Elsevier Interactive Patient Education 2016 [place].  Gastroesophageal Reflux Disease, Adult Normally, food travels down the esophagus and stays in the stomach to be digested. If a person has gastroesophageal reflux disease (GERD), food and stomach acid move back up into the esophagus. When this happens, the esophagus becomes sore and swollen (inflamed). Over time, GERD can make small holes (ulcers) in the lining of the esophagus. HOME CARE Diet  Follow a diet as told by your doctor. You may need to avoid foods and drinks such as:  Coffee and tea (with or without caffeine).  Drinks that contain alcohol.  Energy drinks and sports drinks.  Carbonated drinks or sodas.  Chocolate  and cocoa.  Peppermint and mint flavorings.  Garlic and onions.  Horseradish.  Spicy and acidic foods, such as peppers, chili powder, curry powder,  vinegar, hot sauces, and BBQ sauce.  Citrus fruit juices and citrus fruits, such as oranges, lemons, and limes.  Tomato-based foods, such as red sauce, chili, salsa, and pizza with red sauce.  Fried and fatty foods, such as donuts, french fries, potato chips, and high-fat dressings.  High-fat meats, such as hot dogs, rib eye steak, sausage, ham, and bacon.  High-fat dairy items, such as whole milk, butter, and cream cheese.  Eat small meals often. Avoid eating large meals.  Avoid drinking large amounts of liquid with your meals.  Avoid eating meals during the 2-3 hours before bedtime.  Avoid lying down right after you eat.  Do not exercise right after you eat. General Instructions  Pay attention to any changes in your symptoms.  Take over-the-counter and prescription medicines only as told by your doctor. Do not take aspirin, ibuprofen, or other NSAIDs unless your doctor says it is okay.  Do not use any tobacco products, including cigarettes, chewing tobacco, and e-cigarettes. If you need help quitting, ask your doctor.  Wear loose clothes. Do not wear anything tight around your waist.  Raise (elevate) the head of your bed about 6 inches (15 cm).  Try to lower your stress. If you need help doing this, ask your doctor.  If you are overweight, lose an amount of weight that is healthy for you. Ask your doctor about a safe weight loss goal.  Keep all follow-up visits as told by your doctor. This is important. GET HELP IF:  You have new symptoms.  You lose weight and you do not know why it is happening.  You have trouble swallowing, or it hurts to swallow.  You have wheezing or a cough that keeps happening.  Your symptoms do not get better with treatment.  You have a hoarse voice. GET HELP RIGHT AWAY IF:  You have pain in your arms, neck, jaw, teeth, or back.  You feel sweaty, dizzy, or light-headed.  You have chest pain or shortness of breath.  You throw up  (vomit) and your throw up looks like blood or coffee grounds.  You pass out (faint).  Your poop (stool) is bloody or black.  You cannot swallow, drink, or eat.   This information is not intended to replace advice given to you by your health care provider. Make sure you discuss any questions you have with your health care provider.   Document Released: 03/25/2008 Document Revised: 06/28/2015 Document Reviewed: 02/01/2015 Elsevier Interactive Patient Education [employer].

## 2016-04-02 NOTE — Progress Notes (Addendum)
By signing my name below, I, Mesha Guinyard, attest that this documentation has been prepared under the direction and in the presence of Delman Cheadle, MD.  Electronically Signed: Verlee Monte, Medical Scribe. 04/02/2016. 8:38 AM.  Subjective:    Patient ID: Barbara Dennis, female    DOB: 12/16/1933, 82 y.o.   MRN: [id number]  HPI Chief Complaint  Patient presents with  . Heartburn  . Eczema    HPI Comments: Barbara Dennis is a 82 y.o. female who presents to the [facility] complaining of heart burn 3 days ago. History was obtained using a phone interpretor in [place]. The pain occurs Pt states her heart burn last for 3-4 days. Pt hasn't taken an OTC medication for her symptoms. Pain worsens when she lays down. Pt occasionally has metalic taste in mouth. Pt experiences coughing in the morning, fever, nausea, and occasionally has abdominal pain. Pt has a bm every other day. Pt has been exercising and feels fine when she walks. Pt states that she's been taking miralax every day and it helps keep her regular. Pt takes medication for the body aches but unable to specify which one. Pt has been taking ranitidine QD, and tylenol without relief to her symptoms. Pt took gabapentin 300mg  at night (2 additional refills), liniopril 10 mg(no refills). Pt mentions the gabapentin is too strong for her. Pt denies diaphoresis.  C/o itching over distal arms and legs worse in the sun. She is using a cream of unknown type which does help. Gets occ red spots.  Still smoking.  Lives a lone, a friend brought her here today.    Patient Active Problem List   Diagnosis Date Noted  . Depression with anxiety 01/06/2015  . Cervical adenopathy 03/31/2014  . 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 HTN, goal below 150/90 06/17/2013  . DJD (degenerative joint disease) 02/16/2013  . Tobacco user 09/23/2012  . Menopause 08/21/2012  . Postmenopausal atrophic vaginitis 08/21/2012   Past Medical  History  Diagnosis Date  . Hypertension   . Mass of right side of neck    Past Surgical History  Procedure Laterality Date  . Appendectomy     No Known Allergies Prior to Admission medications   Medication Sig Start Date End Date Taking? Authorizing Provider  acetaminophen (TYLENOL ARTHRITIS PAIN) 650 MG CR tablet Take 1 tablet (650 mg total) by mouth every 12 (twelve) hours. For joint pain 02/22/16  Yes Shawnee Knapp, MD  Calcium Carbonate-Vitamin D3 (CALCIUM 600/VITAMIN D) 600-400 MG-UNIT TABS Take 1 tablet by mouth 2 (two) times daily. 02/22/16  Yes Shawnee Knapp, MD  dextromethorphan-guaiFENesin [facility] DM) 30-600 MG 12hr tablet Take 1 tablet by mouth 2 (two) times daily as needed for cough. 02/22/16  Yes Shawnee Knapp, MD  diclofenac sodium (VOLTAREN) 1 % GEL Apply 4 g topically 4 (four) times daily. 02/22/16  Yes Shawnee Knapp, MD  Glucos-Chond-Hyal Ac-Ca Fructo (MOVE FREE JOINT HEALTH ADVANCE) TABS Take 1 tablet by mouth 2 (two) times daily. Or as instructed by packaging 02/22/16  Yes Shawnee Knapp, MD  lisinopril (PRINIVIL,ZESTRIL) 10 MG tablet Take 1 tablet (10 mg total) by mouth daily. 05/05/15  Yes Shawnee Knapp, MD  PROAIR HFA 108 [id number] Base) MCG/ACT inhaler INL 2 PFS INTO THE LUNGS Q 4 H PRF WHZ OR SOB OR COUGH 09/07/15  Yes Historical Provider, MD  ranitidine (ZANTAC) 150 MG tablet Take 1 tablet (150 mg total) by mouth 2 (two)  times daily as needed for heartburn (or indigestion). 02/22/16  Yes Shawnee Knapp, MD  tiZANidine (ZANAFLEX) 2 MG tablet Take 1 tablet (2 mg total) by mouth at bedtime. 02/22/16  Yes Shawnee Knapp, MD   Social History   Social History  . Marital Status: Widowed    Spouse Name: N/A  . Number of Children: N/A  . Years of Education: N/A   Occupational History  . Not on file.   Social History Main Topics  . Smoking status: Current Every Day Smoker -- 0.70 packs/day for 60 years    Types: Cigarettes  . Smokeless tobacco: Never Used  . Alcohol Use: No  . Drug Use: No  . Sexual Activity:  Not on file   Other Topics Concern  . Not on file   Social History Narrative   Marital status: widowed 28 years ago.  From [place]; moved to [place] 1974.     Children: none       Lives:  Alone; brother in law is Psychologist, sport and exercise who is 23.      Employment: retired.       Tobacco:  1/2 ppd       Alcohol:  None      Exercise:  Walking daily.      ADLs: no driving.   Depression screen [facility] 2/9 04/02/2016 02/22/2016 12/27/2015 12/07/2015 11/30/2015  Decreased Interest 0 0 0 0 0  Down, Depressed, Hopeless 0 0 0 0 0  PHQ - 2 Score 0 0 0 0 0  Altered sleeping - - - - -  Tired, decreased energy - - - - -  Change in appetite - - - - -  Feeling bad or failure about yourself  - - - - -  Trouble concentrating - - - - -  Moving slowly or fidgety/restless - - - - -  Suicidal thoughts - - - - -  PHQ-9 Score - - - - -   Review of Systems  Constitutional: Positive for fever. Negative for diaphoresis.  Respiratory: Positive for cough.   Cardiovascular: Positive for chest pain.  Gastrointestinal: Positive for nausea and abdominal pain. Negative for diarrhea, constipation and blood in stool.  Genitourinary: Negative for dysuria and difficulty urinating.  Musculoskeletal: Positive for myalgias.    Objective:  BP 122/68 mmHg  Pulse 74  Temp(Src) 97.9 F (36.6 C) (Oral)  Resp 18  Ht 4' 10.25" (1.48 m)  Wt 102 lb (46.267 kg)  BMI 21.12 kg/m2  SpO2 99%  Physical Exam  Constitutional: She appears well-developed and well-nourished. No distress.  HENT:  Head: Normocephalic and atraumatic.  Eyes: Conjunctivae are normal.  Neck: Neck supple.  Cardiovascular: Normal rate, regular rhythm and normal heart sounds.  Exam reveals no gallop and no friction rub.   No murmur heard. Pulses:      Dorsalis pedis pulses are 2+ on the right side, and 2+ on the left side.  Pulmonary/Chest: Effort normal. She has decreased breath sounds.  Decreased air movement throughout with bibasilar expiratory rhales  Abdominal: Soft.  Bowel sounds are normal. She exhibits no distension. There is no tenderness. There is no rebound.  Neurological: She is alert.  Skin: Skin is warm and dry.  Psychiatric: She has a normal mood and affect. Her behavior is normal.  Nursing note and vitals reviewed.  Results for orders placed or performed in visit on 04/02/16  POCT CBC  Result Value Ref Range   WBC 9.3 4.6 - 10.2 K/uL   Lymph,  poc 2.8 0.6 - 3.4   POC LYMPH PERCENT 30.2 10 - 50 %L   MID (cbc) 0.8 0 - 0.9   POC MID % 8.5 0 - 12 %M   POC Granulocyte 5.7 2 - 6.9   Granulocyte percent 61.3 37 - 80 %G   RBC 3.48 (A) 4.04 - 5.48 M/uL   Hemoglobin 11.5 (A) 12.2 - 16.2 g/dL   HCT, POC 32.2 (A) 37.7 - 47.9 %   MCV 92.4 80 - 97 fL   MCH, POC 33.0 (A) 27 - 31.2 pg   MCHC 35.7 (A) 31.8 - 35.4 g/dL   RDW, POC 13.8 %   Platelet Count, POC 268 142 - 424 K/uL   MPV 6.4 0 - 99.8 fL   EKG showed signs of strain with flipped Ts in medial chest leads and low voltage- consistent with COPD  Assessment & Plan:   1. Gastroesophageal reflux disease without esophagitis   2. Chest pain, unspecified chest pain type   3. Anemia, unspecified anemia type   4. Tobacco user   5. Nonspecific abnormal electrocardiogram (ECG) (EKG)   6. Chronic obstructive pulmonary disease, unspecified COPD type ([place])   7. Rash and nonspecific skin eruption   Pt again reinforced multiple times that I really need her to bring all of her medications with her. Do not take any otc nsaids, ok to use acetaminophen. Ok to use top voltaren gel but sounds like pt might not have ever picked that up from pharm. Repeatedly questioned on omeprazole - 43min before breakfast, ok to cont prn zantac. Stop gabapentin - could change to 100mg  prn - offered to dispose of 300mg  as pt continued to state it was to strong but she declined. Advised to stop smoking to aide in improvement in GERD sxs.  Orders Placed This Encounter  Procedures  . H. pylori breath test  . Comprehensive  metabolic panel  . Lipase  . POCT CBC  . POC Hemoccult Bld/Stl (3-Cd Home Screen)    Standing Status: Future     Number of Occurrences:      Standing Expiration Date: 04/02/2017  . EKG 12-Lead    Meds ordered this encounter  Medications  . omeprazole (PRILOSEC) 40 MG capsule    Sig: Take 1 capsule (40 mg total) by mouth daily. 30 minutes prior to breakfast    Dispense:  30 capsule    Refill:  1  . Triamcinolone Acetonide (TRIAMCINOLONE 0.1 % CREAM : EUCERIN) CREA    Sig: Apply 1 application topically 2 (two) times daily as needed for rash or itching.    Dispense:  1 each    Refill:  0   Language level caveat. Over 40 min spent in face-to-face evaluation of and consultation with patient and coordination of care.  Over 50% of this time was spent counseling this patient.  I personally performed the services described in this documentation, which was scribed in my presence. The recorded information has been reviewed and considered, and addended by me as needed.   Delman Cheadle, M.D.  [facility] [address] [place], [place] [address] [phone] phone [phone] fax  04/02/2016 10:01 AM  ADDENDUM: H. Pylori gastritis + -  Treat with omeprazole 20mg  bid, amox 1000mg  bid, flagyl 500 bid, and clarithromycin 500 bid all for 14d.

## 2016-04-03 LAB — H. PYLORI BREATH TEST: H. pylori Breath Test: DETECTED — AB

## 2016-04-04 ENCOUNTER — Telehealth: Payer: Self-pay

## 2016-04-04 NOTE — Telephone Encounter (Signed)
Patient speaks [place] and needs her lab results. She has been using her neighbor to help translate and there is a comminication barrier. Please call! [phone] or [phone]

## 2016-04-08 MED ORDER — OMEPRAZOLE 20 MG PO CPDR: 40.0000 mg | DELAYED_RELEASE_CAPSULE | Freq: Two times a day (BID) | ORAL | Status: DC

## 2016-04-08 MED ORDER — METRONIDAZOLE 500 MG PO TABS: 500.0000 mg | ORAL_TABLET | Freq: Two times a day (BID) | ORAL | Status: DC

## 2016-04-08 MED ORDER — CLARITHROMYCIN 500 MG PO TABS: 500.0000 mg | ORAL_TABLET | Freq: Two times a day (BID) | ORAL | Status: DC

## 2016-04-08 MED ORDER — AMOXICILLIN 500 MG PO CAPS: 1000.0000 mg | ORAL_CAPSULE | Freq: Two times a day (BID) | ORAL | Status: DC

## 2016-04-08 NOTE — Addendum Note (Signed)
Addended by: Delman Cheadle on: 04/08/2016 03:34 PM   Modules accepted: Orders

## 2016-04-08 NOTE — Telephone Encounter (Signed)
Please review

## 2016-04-08 NOTE — Telephone Encounter (Signed)
She has H. Pylori gastritis. Antibiotics have been sent to pharmacy for her to take.  Please call her using the [place] interpretor from the phone line. Note sent to lab pool

## 2016-04-16 ENCOUNTER — Telehealth: Payer: Self-pay | Admitting: Emergency Medicine

## 2016-04-16 NOTE — Telephone Encounter (Signed)
-----   Message from Shawnee Knapp, MD sent at 04/08/2016  3:41 PM EDT ----- Please call pt with pacific interpretor phone line in [place] to let her know that she has a type of bacteria in her stomach called H. Pylori that causes stomach ulcers.  We have to kill the bacteria and then her stomach can heal. She will need to take 3 antibiotics (amoxicillin 2 tabs, clarithromycin 1 tab, and metronidazole 1 tab) all twice a day for 2 weeks. She also needs to increase her omeprazole to twice a day for those 2 weeks as well.  All of these prescriptions have been sent to her pharmacy. The salts in her blood were a little low - likely as she had not been eating as well since she was having stomach pain. Ok to add just a little more salt into her diet. If she starts to have headaches or feels tired or sluggish, come back in for recheck immed. We should recheck this at her next visit.

## 2016-04-16 NOTE — Telephone Encounter (Signed)
Attempted to reach pt with results twice and pt gave [place] a friends number for results [place] for [place] language called friend number and no answer or VM Will try again.

## 2016-05-20 ENCOUNTER — Other Ambulatory Visit: Payer: Self-pay | Admitting: Family Medicine

## 2016-05-20 DIAGNOSIS — I10 Essential (primary) hypertension: Secondary | ICD-10-CM

## 2016-06-06 ENCOUNTER — Encounter: Payer: Self-pay | Admitting: Family Medicine

## 2016-06-06 ENCOUNTER — Ambulatory Visit (INDEPENDENT_AMBULATORY_CARE_PROVIDER_SITE_OTHER): Payer: Medicare Other | Admitting: Family Medicine

## 2016-06-06 VITALS — BP 128/80 | HR 91 | Temp 97.9°F | Ht <= 58 in | Wt 98.4 lb

## 2016-06-06 DIAGNOSIS — M79661 Pain in right lower leg: Secondary | ICD-10-CM

## 2016-06-06 DIAGNOSIS — M79662 Pain in left lower leg: Secondary | ICD-10-CM | POA: Diagnosis not present

## 2016-06-06 NOTE — Progress Notes (Signed)
Subjective:    Patient ID: Barbara Dennis, female    DOB: [birth year], 82 y.o.   MRN: [id number] Chief Complaint  Patient presents with  . CALF PAIN    BOTH for 2 months    HPI For 2 months has had pain in both calves in the morning for a short time and the pain goes away.    stomach better   Pt did not complete full course of antibiotics for h. Pylori but states sxs have resolved.  Used [place] interpretor on the [employer] service for the duration of pt's visit.  Past Medical History:  Diagnosis Date  . Hypertension   . Mass of right side of neck    Past Surgical History:  Procedure Laterality Date  . APPENDECTOMY     Current Outpatient Prescriptions on File Prior to Visit  Medication Sig Dispense Refill  . acetaminophen (TYLENOL ARTHRITIS PAIN) 650 MG CR tablet Take 1 tablet (650 mg total) by mouth every 12 (twelve) hours. For joint pain 180 tablet 3  . lisinopril (PRINIVIL,ZESTRIL) 10 MG tablet TAKE 1 TABLET(10 MG) BY MOUTH DAILY 90 tablet 0  . omeprazole (PRILOSEC) 20 MG capsule Take 2 capsules (40 mg total) by mouth 2 (two) times daily. 30 minutes prior to breakfast 60 capsule 1  . PROAIR HFA 108 (90 Base) MCG/ACT inhaler INL 2 PFS INTO THE LUNGS Q 4 H PRF WHZ OR SOB OR COUGH  1  . ranitidine (ZANTAC) 150 MG tablet Take 1 tablet (150 mg total) by mouth 2 (two) times daily as needed for heartburn (or indigestion). 180 tablet 3  . tiZANidine (ZANAFLEX) 2 MG tablet Take 1 tablet (2 mg total) by mouth at bedtime. 30 tablet 2  . Triamcinolone Acetonide (TRIAMCINOLONE 0.1 % CREAM : EUCERIN) CREA Apply 1 application topically 2 (two) times daily as needed for rash or itching. 1 each 0  . Calcium Carbonate-Vitamin D3 (CALCIUM 600/VITAMIN D) 600-400 MG-UNIT TABS Take 1 tablet by mouth 2 (two) times daily. (Patient not taking: Reported on 06/06/2016) 180 tablet 3  . clarithromycin (BIAXIN) 500 MG tablet Take 1 tablet (500 mg total) by mouth 2 (two) times daily. (Patient not taking:  Reported on 06/06/2016) 28 tablet 0  . diclofenac sodium (VOLTAREN) 1 % GEL Apply 4 g topically 4 (four) times daily. (Patient not taking: Reported on 06/06/2016) 100 g 2  . Glucos-Chond-Hyal Ac-Ca Fructo (MOVE FREE JOINT HEALTH ADVANCE) TABS Take 1 tablet by mouth 2 (two) times daily. Or as instructed by packaging (Patient not taking: Reported on 06/06/2016) 60 tablet 3   No current facility-administered medications on file prior to visit.    No Known Allergies No family history on file. Social History   Social History  . Marital status: Widowed    Spouse name: N/A  . Number of children: N/A  . Years of education: N/A   Social History Main Topics  . Smoking status: Current Every Day Smoker    Packs/day: 0.70    Years: 60.00    Types: Cigarettes  . Smokeless tobacco: Never Used  . Alcohol use No  . Drug use: No  . Sexual activity: Not Asked   Other Topics Concern  . None   Social History Narrative   Marital status: widowed 28 years ago.  From [place]; moved to [place] 1974.     Children: none       Lives:  Alone; brother in law is Psychologist, sport and exercise who is 46.  Employment: retired.       Tobacco:  1/2 ppd       Alcohol:  None      Exercise:  Walking daily.      ADLs: no driving.     Review of Systems  Constitutional: Positive for fatigue. Negative for activity change, appetite change, chills, diaphoresis, fever and unexpected weight change.  Respiratory: Negative for cough, chest tightness, shortness of breath and wheezing.   Cardiovascular: Negative for chest pain, palpitations and leg swelling.  Musculoskeletal: Positive for arthralgias, joint swelling and myalgias. Negative for back pain, gait problem, neck pain and neck stiffness.  Neurological: Negative for weakness and numbness.  Hematological: Negative for adenopathy. Does not bruise/bleed easily.  Psychiatric/Behavioral: Positive for sleep disturbance.       Objective:   Physical Exam  Constitutional: She is oriented to  person, place, and time. She appears well-developed and well-nourished. No distress.  HENT:  Head: Normocephalic and atraumatic.  Right Ear: External ear normal.  Left Ear: External ear normal.  Eyes: Conjunctivae are normal. No scleral icterus.  Neck: Normal range of motion. Neck supple. No thyromegaly present.  Cardiovascular: Normal rate, regular rhythm, normal heart sounds and intact distal pulses.   Pulmonary/Chest: Effort normal and breath sounds normal. No respiratory distress.  Musculoskeletal: She exhibits no edema.  Lymphadenopathy:    She has no cervical adenopathy.  Neurological: She is alert and oriented to person, place, and time.  Skin: Skin is warm and dry. She is not diaphoretic. No erythema.  Psychiatric: She has a normal mood and affect. Her behavior is normal.         BP 128/80 (BP Location: Left Arm, Patient Position: Sitting, Cuff Size: Small)   Pulse 91   Temp 97.9 F (36.6 C) (Oral)   Ht 4\' 10"  (1.473 m)   Wt 98 lb 6.4 oz (44.6 kg)   BMI 20.57 kg/m   Assessment & Plan:  Hyponatremia Cmp, cpk - pt refuses labs today but agrees to do at next visit if sxs persist.  I am suspicious that her sxs may be due to med side effects - she has been prescribed a number of things prior and due to language/culture differences I am concerned she is taking something that was stopped.  Therefore, will ask pt to stop off of her medicines other than lisinopril alone. Increase salt in the diet, focus on healthy diet. Look into silver sneakers - however, pt has transportation difficulties - a friend brings her to the office.  If GI sxs recur, retest for h. Pylori since only completed part of treatment course but for now doing well so try to go off ppi.   [name] handed in [facility] cards but they were misplaced before they could be read. Needs to repeat cards - ensure not double charged. Forgot to discuss today so will do at follow-up.  Strongly encouraged pt again to bring all  of her meds/supplements to her next f/u OV.   1. Bilateral calf pain   Bilateral calf pain - no sxs concerning for PAD/claudication - sounds more arthritic with stiffness in the morning that resolves with activity. Advised increasing H20 and exercise, provided reassurance.  Over 40 min spent in face-to-face evaluation of and consultation with patient and coordination of care.  Over 50% of this time was spent counseling this patient. Language level caveat - [place] interpetor used.  Delman Cheadle, M.D.  [facility] [place],  [place] [address] [phone] phone [phone] fax  06/13/16 10:48 AM

## 2016-06-06 NOTE — Patient Instructions (Addendum)
Stop all of your medicines except for your lisinopril and try to get more protein in your diet.  You should be able to come off the stomach medicines if the antibiotic works. Please bring all your medicines to the next visit. Try to eat a better diet with more salt. Eating preprepared food from the supermarket is fine. Think about getting silver sneakers - your insurance will pay for you to join a gym. If you are having stomach  IF you received an x-ray today, you will receive an invoice from [facility]. Please contact [facility] at [phone] with questions or concerns regarding your invoice.   IF you received labwork today, you will receive an invoice from [employer]. Please contact [facility] at [phone] with questions or concerns regarding your invoice.   Our billing staff will not be able to assist you with questions regarding bills from these companies.  You will be contacted with the lab results as soon as they are available. The fastest way to get your results is to activate your My Chart account. Instructions are located on the last page of this paperwork. If you have not heard from [place] regarding the results in 2 weeks, please contact this office.    High-Protein and High-Calorie Diet Eating high-protein and high-calorie foods can help you to gain weight, heal after an injury, and recover after an illness or surgery.  WHAT IS MY PLAN? The specific amount of daily protein and calories you need depends on:  Your body weight.  The reason this diet is recommended for you. Generally, a high-protein, high-calorie diet involves:   Eating 250-500 extra calories each day.  Making sure that 10-35% of your daily calories come from protein. Talk to your health care provider about how much protein and how many calories you need each day. Follow the diet as directed by your health care provider.  WHAT DO I NEED TO KNOW ABOUT THIS DIET?  Ask  your health care provider if you should take a nutritional supplement.   Try to eat six small meals each day instead of three large meals.   Eat a balanced diet, including one food that is high in protein at each meal.  Keep nutritious snacks handy, such as nuts, trail mixes, dried fruit, and yogurt.   If you have kidney disease or diabetes, eating too much protein may put extra stress on your kidneys. Talk to your health care provider if you have either of those conditions. WHAT ARE SOME HIGH-PROTEIN FOODS? Grains Quinoa. Bulgur wheat. Vegetables Soybeans. Peas. Meats and Other Protein Sources Beef, pork, and poultry. Fish and seafood. Eggs. Tofu. Textured vegetable protein (TVP). Peanut butter. Nuts and seeds. Dried beans. Protein powders. Dairy Whole milk. Whole-milk yogurt. Powdered milk. Cheese. [employer]. Eggnog. Beverages High-protein supplement drinks. Soy milk. Other Protein bars. The items listed above may not be a complete list of recommended foods or beverages. Contact your dietitian for more options. WHAT ARE SOME HIGH-CALORIE FOODS? Grains Pasta. Quick breads. Muffins. Pancakes. Ready-to-eat cereal. Vegetables Vegetables cooked in oil or butter. Fried potatoes. Fruits Dried fruit. Fruit leather. Canned fruit in syrup. Fruit juice. Avocados. Meats and Other Protein Sources Peanut butter. Nuts and seeds. Dairy Heavy cream. Whipped cream. Cream cheese. Sour cream. Ice cream. Custard. Pudding. Beverages Meal-replacement beverages. Nutrition shakes. Fruit juice. Sugar-sweetened soft drinks. Condiments Salad dressing. Mayonnaise. Alfredo sauce. Fruit preserves or jelly. Honey. Syrup. Sweets/Desserts Cake. Cookies. Pie. Pastries. Candy bars. Chocolate. Fats and Oils Butter or  margarine. Oil. Gravy. Other Meal-replacement bars. The items listed above may not be a complete list of recommended foods or beverages. Contact your dietitian for more  options. WHAT ARE SOME TIPS FOR INCLUDING HIGH-PROTEIN AND HIGH-CALORIE FOODS IN MY DIET?  Add whole milk, half-and-half, or heavy cream to cereal, pudding, soup, or hot cocoa.  Add whole milk to instant breakfast drinks.  Add peanut butter to oatmeal or smoothies.  Add powdered milk to baked goods, smoothies, or milkshakes.  Add powdered milk, cream, or butter to mashed potatoes.  Add cheese to cooked vegetables.  Make whole-milk yogurt parfaits. Top them with granola, fruit, or nuts.  Add cottage cheese to your fruit.  Add avocados, cheese, or both to sandwiches or salads.  Add meat, poultry, or seafood to rice, pasta, casseroles, salads, and soups.   Use mayonnaise when making egg salad, chicken salad, or tuna salad.  Use peanut butter as a topping for pretzels, celery, or crackers.  Add beans to casseroles, dips, and spreads.  Add pureed beans to sauces and soups.  Replace calorie-free drinks with calorie-containing drinks, such as milk and fruit juice.   This information is not intended to replace advice given to you by your health care provider. Make sure you discuss any questions you have with your health care provider.   Document Released: 10/07/2005 Document Revised: 10/28/2014 Document Reviewed: 03/22/2014 Elsevier Interactive Patient Education [employer].

## 2016-08-23 ENCOUNTER — Other Ambulatory Visit: Payer: Self-pay | Admitting: Family Medicine

## 2016-08-23 DIAGNOSIS — I10 Essential (primary) hypertension: Secondary | ICD-10-CM

## 2016-11-22 ENCOUNTER — Other Ambulatory Visit: Payer: Self-pay | Admitting: Physician Assistant

## 2016-11-22 DIAGNOSIS — I10 Essential (primary) hypertension: Secondary | ICD-10-CM

## 2016-12-04 ENCOUNTER — Ambulatory Visit (INDEPENDENT_AMBULATORY_CARE_PROVIDER_SITE_OTHER): Payer: Medicare Other | Admitting: Physician Assistant

## 2016-12-04 VITALS — BP 132/78 | HR 84 | Temp 97.7°F | Resp 16 | Ht <= 58 in | Wt 102.8 lb

## 2016-12-04 DIAGNOSIS — I10 Essential (primary) hypertension: Secondary | ICD-10-CM

## 2016-12-04 DIAGNOSIS — Z0000 Encounter for general adult medical examination without abnormal findings: Secondary | ICD-10-CM | POA: Diagnosis not present

## 2016-12-04 NOTE — Progress Notes (Signed)
[facility] [address] [place] [address] [phone]  Date:  12/04/2016   Name:  Barbara Dennis   DOB:  [birth year]   MRN:  [id number]  PCP:  Delman Cheadle, MD    History of Present Illness:  Barbara Dennis is a 83 y.o. female patient who presents to [facility] for cc of annual physical exam.  Diet: rice, vegetables, kimche, beef, fish, pork.  She does like salt.  Water intake: 3-4 glasses, no sodas, 2 cups of coffee.    BM: every 2 days.  No blood or black stool.  Constipated at times.    Urination: normal.  No hematuria, dysuria--there is frequency.    Sleep: she takes tylenol pm.  She gets sleepy.    Social activity: watch tv, smoke  5-7 cigarettes per day etOH No children, 1 miscarriage  She would also like a refill of her anti-hypertensives.  She denies cp, palpitations, sob, or dizziness.  She is compliant with the lisinopril.    Patient Active Problem List   Diagnosis Date Noted  . Depression with anxiety 01/06/2015  . Cervical adenopathy 03/31/2014  . 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 HTN, goal below 150/90 06/17/2013  . DJD (degenerative joint disease) 02/16/2013  . Tobacco user 09/23/2012  . Menopause 08/21/2012  . Postmenopausal atrophic vaginitis 08/21/2012    Past Medical History:  Diagnosis Date  . Hypertension   . Mass of right side of neck     Past Surgical History:  Procedure Laterality Date  . APPENDECTOMY      Social History  Substance Use Topics  . Smoking status: Current Every Day Smoker    Packs/day: 0.70    Years: 60.00    Types: Cigarettes  . Smokeless tobacco: Never Used  . Alcohol use No    No family history on file.  No Known Allergies  Medication list has been reviewed and updated.  Current Outpatient Prescriptions on File Prior to Visit  Medication Sig Dispense Refill  . acetaminophen (TYLENOL ARTHRITIS PAIN) 650 MG CR tablet Take 1 tablet (650 mg total) by mouth every 12 (twelve) hours. For  joint pain 180 tablet 3  . lisinopril (PRINIVIL,ZESTRIL) 10 MG tablet TAKE 1 TABLET BY MOUTH EVERY DAY. MUST BE SEEN FOR REFILLS 30 tablet 0  . Calcium Carbonate-Vitamin D3 (CALCIUM 600/VITAMIN D) 600-400 MG-UNIT TABS Take 1 tablet by mouth 2 (two) times daily. (Patient not taking: Reported on 06/06/2016) 180 tablet 3  . clarithromycin (BIAXIN) 500 MG tablet Take 1 tablet (500 mg total) by mouth 2 (two) times daily. (Patient not taking: Reported on 06/06/2016) 28 tablet 0  . diclofenac sodium (VOLTAREN) 1 % GEL Apply 4 g topically 4 (four) times daily. (Patient not taking: Reported on 06/06/2016) 100 g 2  . Glucos-Chond-Hyal Ac-Ca Fructo (MOVE FREE JOINT HEALTH ADVANCE) TABS Take 1 tablet by mouth 2 (two) times daily. Or as instructed by packaging (Patient not taking: Reported on 06/06/2016) 60 tablet 3  . omeprazole (PRILOSEC) 20 MG capsule Take 2 capsules (40 mg total) by mouth 2 (two) times daily. 30 minutes prior to breakfast 60 capsule 1  . PROAIR HFA 108 (90 Base) MCG/ACT inhaler INL 2 PFS INTO THE LUNGS Q 4 H PRF WHZ OR SOB OR COUGH  1  . ranitidine (ZANTAC) 150 MG tablet Take 1 tablet (150 mg total) by mouth 2 (two) times daily as needed for heartburn (or indigestion). 180 tablet 3  . tiZANidine (ZANAFLEX) 2  MG tablet Take 1 tablet (2 mg total) by mouth at bedtime. 30 tablet 2  . Triamcinolone Acetonide (TRIAMCINOLONE 0.1 % CREAM : EUCERIN) CREA Apply 1 application topically 2 (two) times daily as needed for rash or itching. 1 each 0   No current facility-administered medications on file prior to visit.     Review of Systems  Constitutional: Negative for chills and fever.  HENT: Negative for ear discharge, ear pain and sore throat.   Eyes: Negative for blurred vision and double vision.  Respiratory: Negative for cough, shortness of breath and wheezing.   Cardiovascular: Negative for chest pain, palpitations and leg swelling.  Gastrointestinal: Negative for diarrhea, nausea and vomiting.   Genitourinary: Negative for dysuria, frequency and hematuria.  Skin: Negative for itching and rash.  Neurological: Negative for dizziness and headaches.     Physical Examination: BP 132/78   Pulse 84   Temp 97.7 F (36.5 C) (Oral)   Resp 16   Ht 4' 10"  (1.473 m)   Wt 102 lb 12.8 oz (46.6 kg)   SpO2 98%   BMI 21.49 kg/m  Ideal Body Weight: Weight in (lb) to have BMI = 25: 119.4  Physical Exam  Constitutional: She is oriented to person, place, and time. She appears well-developed and well-nourished. No distress.  HENT:  Head: Normocephalic and atraumatic.  Right Ear: Tympanic membrane, external ear and ear canal normal.  Left Ear: Tympanic membrane, external ear and ear canal normal.  Nose: Right sinus exhibits no maxillary sinus tenderness and no frontal sinus tenderness. Left sinus exhibits no maxillary sinus tenderness and no frontal sinus tenderness.  Mouth/Throat: Oropharynx is clear and moist. No uvula swelling. No oropharyngeal exudate, posterior oropharyngeal edema or posterior oropharyngeal erythema.  Eyes: Conjunctivae and EOM are normal. Pupils are equal, round, and reactive to light.  Neck: Normal range of motion. Neck supple. No thyromegaly present.  Cardiovascular: Normal rate, regular rhythm, normal heart sounds and intact distal pulses.  Exam reveals no gallop, no distant heart sounds and no friction rub.   No murmur heard. Pulmonary/Chest: Effort normal and breath sounds normal. No respiratory distress. She has no decreased breath sounds. She has no wheezes. She has no rhonchi.  Abdominal: Soft. Bowel sounds are normal. She exhibits no distension and no mass. There is no tenderness.  Musculoskeletal: Normal range of motion. She exhibits no edema or tenderness.  Lymphadenopathy:       Head (right side): No submandibular, no tonsillar, no preauricular and no posterior auricular adenopathy present.       Head (left side): No submandibular, no tonsillar, no  preauricular and no posterior auricular adenopathy present.    She has no cervical adenopathy.  Neurological: She is alert and oriented to person, place, and time. No cranial nerve deficit. She exhibits normal muscle tone. Coordination normal.  Skin: Skin is warm and dry. She is not diaphoretic.  Psychiatric: She has a normal mood and affect. Her behavior is normal.   Fall Risk  12/04/2016 06/06/2016 04/02/2016 02/22/2016 12/27/2015  Falls in the past year? No No No No Yes    Assessment and Plan: Barbara Dennis is a 83 y.o. female who is here today for cc of annual physical exam.  Annual physical exam - Plan: CBC, CMP14+EGFR, Lipid panel  Essential hypertension - Plan: CBC, CMP14+EGFR, Lipid panel  Ivar Drape, PA-C [facility] [place] 2/21/20189:06 AM

## 2016-12-04 NOTE — Patient Instructions (Addendum)
I have refilled your medication today. I will be in touch with your results. For sleep, you can try melatonin.  This is over the counter at any pharmacy.   IF you received an x-ray today, you will receive an invoice from [facility]. Please contact [facility] Radiology at [phone] with questions or concerns regarding your invoice.   IF you received labwork today, you will receive an invoice from [facility]. Please contact [facility] at [phone] with questions or concerns regarding your invoice.   Our billing staff will not be able to assist you with questions regarding bills from these companies.  You will be contacted with the lab results as soon as they are available. The fastest way to get your results is to activate your My Chart account. Instructions are located on the last page of this paperwork. If you have not heard from [place] regarding the results in 2 weeks, please contact this office.

## 2016-12-05 LAB — LIPID PANEL
CHOLESTEROL TOTAL: 257 mg/dL — AB (ref 100–199)
Chol/HDL Ratio: 4.8 ratio units — ABNORMAL HIGH (ref 0.0–4.4)
HDL: 53 mg/dL (ref >39)
LDL CALC: 139 mg/dL — AB (ref 0–99)
Triglycerides: 325 mg/dL — ABNORMAL HIGH (ref 0–149)
VLDL CHOLESTEROL CAL: 65 mg/dL — AB (ref 5–40)

## 2016-12-05 LAB — CMP14+EGFR
ALBUMIN: 4.3 g/dL (ref 3.5–4.7)
ALK PHOS: 74 IU/L (ref 39–117)
ALT: 14 IU/L (ref 0–32)
AST: 15 IU/L (ref 0–40)
Albumin/Globulin Ratio: 1.2 (ref 1.2–2.2)
BILIRUBIN TOTAL: 0.4 mg/dL (ref 0.0–1.2)
BUN / CREAT RATIO: 21 (ref 12–28)
BUN: 19 mg/dL (ref 8–27)
CO2: 21 mmol/L (ref 18–29)
CREATININE: 0.92 mg/dL (ref 0.57–1.00)
Calcium: 9.3 mg/dL (ref 8.7–10.3)
Chloride: 99 mmol/L (ref 96–106)
GFR calc Af Amer: 68 mL/min/{1.73_m2} (ref >59)
GFR calc non Af Amer: 59 mL/min/{1.73_m2} — ABNORMAL LOW (ref >59)
GLOBULIN, TOTAL: 3.6 g/dL (ref 1.5–4.5)
Glucose: 103 mg/dL — ABNORMAL HIGH (ref 65–99)
Potassium: 4.4 mmol/L (ref 3.5–5.2)
SODIUM: 137 mmol/L (ref 134–144)
Total Protein: 7.9 g/dL (ref 6.0–8.5)

## 2016-12-05 LAB — CBC
HEMATOCRIT: 34.9 % (ref 34.0–46.6)
HEMOGLOBIN: 11.4 g/dL (ref 11.1–15.9)
MCH: 30.6 pg (ref 26.6–33.0)
MCHC: 32.7 g/dL (ref 31.5–35.7)
MCV: 94 fL (ref 79–97)
Platelets: 284 10*3/uL (ref 150–379)
RBC: 3.73 x10E6/uL — AB (ref 3.77–5.28)
RDW: 14.5 % (ref 12.3–15.4)
WBC: 8.2 10*3/uL (ref 3.4–10.8)

## 2016-12-10 ENCOUNTER — Encounter: Payer: Medicare Other | Admitting: Physician Assistant

## 2016-12-23 ENCOUNTER — Other Ambulatory Visit: Payer: Self-pay | Admitting: Family Medicine

## 2016-12-23 DIAGNOSIS — I10 Essential (primary) hypertension: Secondary | ICD-10-CM

## 2016-12-24 ENCOUNTER — Ambulatory Visit (INDEPENDENT_AMBULATORY_CARE_PROVIDER_SITE_OTHER): Payer: Medicare Other | Admitting: Physician Assistant

## 2016-12-24 VITALS — BP 170/70 | HR 84 | Temp 97.9°F | Resp 18 | Ht <= 58 in | Wt 106.0 lb

## 2016-12-24 DIAGNOSIS — R300 Dysuria: Secondary | ICD-10-CM | POA: Diagnosis not present

## 2016-12-24 DIAGNOSIS — N390 Urinary tract infection, site not specified: Secondary | ICD-10-CM | POA: Diagnosis not present

## 2016-12-24 DIAGNOSIS — I10 Essential (primary) hypertension: Secondary | ICD-10-CM | POA: Diagnosis not present

## 2016-12-24 LAB — POCT URINALYSIS DIP (MANUAL ENTRY)
BILIRUBIN UA: NEGATIVE
Bilirubin, UA: NEGATIVE
Glucose, UA: NEGATIVE
Nitrite, UA: NEGATIVE
PH UA: 7
PROTEIN UA: NEGATIVE
RBC UA: NEGATIVE
SPEC GRAV UA: 1.015
UROBILINOGEN UA: 0.2

## 2016-12-24 LAB — POC MICROSCOPIC URINALYSIS (UMFC): Mucus: ABSENT

## 2016-12-24 MED ORDER — CEPHALEXIN 500 MG PO CAPS: 500.0000 mg | ORAL_CAPSULE | Freq: Two times a day (BID) | ORAL | 0 refills | Status: AC

## 2016-12-24 MED ORDER — LISINOPRIL 10 MG PO TABS: 10.0000 mg | ORAL_TABLET | Freq: Every day | ORAL | 1 refills | Status: DC

## 2016-12-24 NOTE — Patient Instructions (Addendum)
Follow up in 6 months for bp reevaluation. Your medications will be at your pharmacy shortly. Thank you for letting me participate in your health and well being.  For UTI, take antibiotics as prescribed. We will contact you with urine culture results.   Urinary Tract Infection, Adult A urinary tract infection (UTI) is an infection of any part of the urinary tract. The urinary tract includes the:  Kidneys.  Ureters.  Bladder.  Urethra. These organs make, store, and get rid of pee (urine) in the body. Follow these instructions at home:  Take over-the-counter and prescription medicines only as told by your doctor.  If you were prescribed an antibiotic medicine, take it as told by your doctor. Do not stop taking the antibiotic even if you start to feel better.  Avoid the following drinks:  Alcohol.  Caffeine.  Tea.  Carbonated drinks.  Drink enough fluid to keep your pee clear or pale yellow.  Keep all follow-up visits as told by your doctor. This is important.  Make sure to:  Empty your bladder often and completely. Do not to hold pee for long periods of time.  Empty your bladder before and after sex.  Wipe from front to back after a bowel movement if you are female. Use each tissue one time when you wipe. Contact a doctor if:  You have back pain.  You have a fever.  You feel sick to your stomach (nauseous).  You throw up (vomit).  Your symptoms do not get better after 3 days.  Your symptoms go away and then come back. Get help right away if:  You have very bad back pain.  You have very bad lower belly (abdominal) pain.  You are throwing up and cannot keep down any medicines or water. This information is not intended to replace advice given to you by your health care provider. Make sure you discuss any questions you have with your health care provider. Document Released: 03/25/2008 Document Revised: 03/14/2016 Document Reviewed: 08/28/2015 Elsevier  Interactive Patient Education  2017 [employer].   IF you received an x-ray today, you will receive an invoice from [facility] Radiology. Please contact [facility] Radiology at [phone] with questions or concerns regarding your invoice.   IF you received labwork today, you will receive an invoice from [facility]. Please contact [facility] at [phone] with questions or concerns regarding your invoice.   Our billing staff will not be able to assist you with questions regarding bills from these companies.  You will be contacted with the lab results as soon as they are available. The fastest way to get your results is to activate your My Chart account. Instructions are located on the last page of this paperwork. If you have not heard from [place] regarding the results in 2 weeks, please contact this office.

## 2016-12-24 NOTE — Progress Notes (Signed)
12/24/2016 at 2:29 PM  Barbara Dennis / DOB: 09/11/1934 / MRN: [id number]  The patient has Menopause; Postmenopausal atrophic vaginitis; Tobacco user; DJD (degenerative joint disease); HTN, goal below 150/90; Insomnia secondary to anxiety; Cervical adenopathy; and Depression with anxiety on her problem list.  SUBJECTIVE  Barbara Dennis is a 83 y.o. female who complains of dysuria, urinary frequency and urinary hesitancy x 3 days. She denies hematuria, flank pain, abdominal pain, pelvic pain, cloudy malordorous urine and vaginal discharge. Has tried OTC medication (cannto recall the name) with no relief. Has hx of UTIs but cannot recall when her last UTI was.   She  has a past medical history of Hypertension and Mass of right side of neck.    Medications reviewed and updated by myself where necessary, and exist elsewhere in the encounter.   Barbara Dennis has No Known Allergies. She  reports that she has been smoking Cigarettes.  She has a 42.00 pack-year smoking history. She has never used smokeless tobacco. She reports that she does not drink alcohol or use drugs. She  has no sexual activity history on file. The patient  has a past surgical history that includes Appendectomy.  Her family history is not on file.  Review of Systems  Constitutional: Negative for chills, diaphoresis and fever.  Gastrointestinal: Negative for nausea and vomiting.    OBJECTIVE  Her  height is 4\' 10"  (1.473 m) and weight is 106 lb (48.1 kg). Her oral temperature is 97.9 F (36.6 C). Her blood pressure is 170/70 (abnormal) and her pulse is 84. Her respiration is 18 and oxygen saturation is 97%.  The patient's body mass index is 22.15 kg/m.  Physical Exam  Constitutional: She is oriented to person, place, and time. She appears well-developed and well-nourished. No distress.  HENT:  Head: Normocephalic and atraumatic.  Eyes: Conjunctivae are normal.  Neck: Normal range of motion.  Cardiovascular: Normal rate,  regular rhythm and normal heart sounds.   Respiratory: Effort normal.  GI: Soft. Bowel sounds are normal. There is no tenderness. There is no CVA tenderness.  Neurological: She is alert and oriented to person, place, and time.  Skin: Skin is warm and dry.  Psychiatric: She has a normal mood and affect.    Results for orders placed or performed in visit on 12/24/16 (from the past 24 hour(s))  POCT urinalysis dipstick     Status: Abnormal   Collection Time: 12/24/16 12:10 PM  Result Value Ref Range   Color, UA yellow yellow   Clarity, UA clear clear   Glucose, UA negative negative   Bilirubin, UA negative negative   Ketones, POC UA negative negative   Spec Grav, UA 1.015    Blood, UA negative negative   pH, UA 7.0    Protein Ur, POC negative negative   Urobilinogen, UA 0.2    Nitrite, UA Negative Negative   Leukocytes, UA small (1+) (A) Negative  POCT Microscopic Urinalysis ([facility])     Status: Abnormal   Collection Time: 12/24/16 12:21 PM  Result Value Ref Range   WBC,UR,HPF,POC Moderate (A) None WBC/hpf   RBC,UR,HPF,POC None None RBC/hpf   Bacteria None None, Too numerous to count   Mucus Absent Absent   Epithelial Cells, UR Per Microscopy Few (A) None, Too numerous to count cells/hpf    ASSESSMENT & PLAN  Barbara Dennis was seen today for dysuria.  Diagnoses and all orders for this visit:  Dysuria -     POCT urinalysis dipstick -  POCT Microscopic Urinalysis ([facility]) -     Urine culture  Essential hypertension -Asymptomatic. Pt has ran out of her bp medication but I notified PA English, who saw this patient for CPE 3 weeks ago and PA English has refilled lisinopril for the patient so she can restart this today. Return in 6 months for reevaluation of bp.   Urinary tract infection without hematuria, site unspecified -     cephALEXin (KEFLEX) 500 MG capsule; Take 1 capsule (500 mg total) by mouth 2 (two) times daily.    The patient was advised to call or come back to clinic  if she does not see an improvement in symptoms, or worsens with the above plan.   Tenna Delaine, PA-C [facility] 12/24/2016 2:29 PM

## 2016-12-24 NOTE — Addendum Note (Signed)
Addended by: Ivar Drape D on: 12/24/2016 12:04 PM   Modules accepted: Orders

## 2016-12-26 LAB — URINE CULTURE

## 2017-01-21 ENCOUNTER — Ambulatory Visit (INDEPENDENT_AMBULATORY_CARE_PROVIDER_SITE_OTHER): Payer: Medicare Other

## 2017-01-21 ENCOUNTER — Ambulatory Visit (INDEPENDENT_AMBULATORY_CARE_PROVIDER_SITE_OTHER): Payer: Medicare Other | Admitting: Physician Assistant

## 2017-01-21 VITALS — BP 154/64 | HR 86 | Temp 98.0°F | Resp 16 | Ht <= 58 in | Wt 103.8 lb

## 2017-01-21 DIAGNOSIS — E785 Hyperlipidemia, unspecified: Secondary | ICD-10-CM

## 2017-01-21 DIAGNOSIS — R05 Cough: Secondary | ICD-10-CM

## 2017-01-21 DIAGNOSIS — R059 Cough, unspecified: Secondary | ICD-10-CM

## 2017-01-21 DIAGNOSIS — J22 Unspecified acute lower respiratory infection: Secondary | ICD-10-CM | POA: Diagnosis not present

## 2017-01-21 MED ORDER — BENZONATATE 100 MG PO CAPS: 100.0000 mg | ORAL_CAPSULE | Freq: Three times a day (TID) | ORAL | 0 refills | Status: DC | PRN

## 2017-01-21 MED ORDER — DOXYCYCLINE HYCLATE 100 MG PO CAPS: 100.0000 mg | ORAL_CAPSULE | Freq: Two times a day (BID) | ORAL | 0 refills | Status: AC

## 2017-01-21 MED ORDER — ALBUTEROL SULFATE HFA 108 (90 BASE) MCG/ACT IN AERS: 2.0000 | INHALATION_SPRAY | RESPIRATORY_TRACT | 1 refills | Status: DC | PRN

## 2017-01-21 NOTE — Progress Notes (Signed)
PRIMARY CARE AT [facility] [address] [place] [address] [phone]  Date:  01/21/2017   Name:  Barbara Dennis   DOB:  1934/07/20   MRN:  [id number]  PCP:  Ivar Drape, PA    History of Present Illness:  Barbara Dennis is a 83 y.o. female patient who presents to PCP with  Chief Complaint  Patient presents with  . Generalized Body Aches  . Cough     patietn reports 2 weeks of a cough that is not productive and not resolving.  She has had continued congestion, and runny nose.  She has no fever, or subjective fever or chills.  She has noted some fatigue, but no sob, or dyspnea.  She has mild sore throat.  Cough is not worse at night.   She is here with her friend who also stated that several weeks ago, she was trying to put a curtain from a chair, and fell, falling on her right buttock.  Patient reports that it was sore and bruised, but has no pain at this time.  No numbness or tingling at this time.    Patient Active Problem List   Diagnosis Date Noted  . Depression with anxiety 01/06/2015  . Cervical adenopathy 03/31/2014  . 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 HTN, goal below 150/90 06/17/2013  . DJD (degenerative joint disease) 02/16/2013  . Tobacco user 09/23/2012  . Menopause 08/21/2012  . Postmenopausal atrophic vaginitis 08/21/2012    Past Medical History:  Diagnosis Date  . Hypertension   . Mass of right side of neck     Past Surgical History:  Procedure Laterality Date  . APPENDECTOMY      Social History  Substance Use Topics  . Smoking status: Current Every Day Smoker    Packs/day: 0.70    Years: 60.00    Types: Cigarettes  . Smokeless tobacco: Never Used  . Alcohol use No    No family history on file.  No Known Allergies  Medication list has been reviewed and updated.  Current Outpatient Prescriptions on File Prior to Visit  Medication Sig Dispense Refill  . acetaminophen (TYLENOL ARTHRITIS PAIN) 650 MG CR tablet Take 1 tablet  (650 mg total) by mouth every 12 (twelve) hours. For joint pain 180 tablet 3  . Calcium Carbonate-Vitamin D3 (CALCIUM 600/VITAMIN D) 600-400 MG-UNIT TABS Take 1 tablet by mouth 2 (two) times daily. 180 tablet 3  . clarithromycin (BIAXIN) 500 MG tablet Take 1 tablet (500 mg total) by mouth 2 (two) times daily. 28 tablet 0  . diclofenac sodium (VOLTAREN) 1 % GEL Apply 4 g topically 4 (four) times daily. 100 g 2  . Glucos-Chond-Hyal Ac-Ca Fructo (MOVE FREE JOINT HEALTH ADVANCE) TABS Take 1 tablet by mouth 2 (two) times daily. Or as instructed by packaging 60 tablet 3  . lisinopril (PRINIVIL,ZESTRIL) 10 MG tablet Take 1 tablet (10 mg total) by mouth daily. 30 tablet 1  . omeprazole (PRILOSEC) 20 MG capsule Take 2 capsules (40 mg total) by mouth 2 (two) times daily. 30 minutes prior to breakfast 60 capsule 1  . ranitidine (ZANTAC) 150 MG tablet Take 1 tablet (150 mg total) by mouth 2 (two) times daily as needed for heartburn (or indigestion). 180 tablet 3  . tiZANidine (ZANAFLEX) 2 MG tablet Take 1 tablet (2 mg total) by mouth at bedtime. 30 tablet 2  . Triamcinolone Acetonide (TRIAMCINOLONE 0.1 % CREAM : EUCERIN) CREA Apply 1 application topically 2 (two) times daily  as needed for rash or itching. 1 each 0   No current facility-administered medications on file prior to visit.     ROS ROS otherwise unremarkable unless listed above.  Physical Examination: BP (!) 154/64 (BP Location: Right Arm, Patient Position: Sitting, Cuff Size: Normal)   Pulse 86   Temp 98 F (36.7 C) (Oral)   Resp 16   Ht 4\' 10"  (1.473 m)   Wt 103 lb 12.8 oz (47.1 kg)   SpO2 96%   BMI 21.69 kg/m  Ideal Body Weight: Weight in (lb) to have BMI = 25: 119.4  Physical Exam  Constitutional: She is oriented to person, place, and time. She appears well-developed and well-nourished. No distress.  HENT:  Head: Normocephalic and atraumatic.  Right Ear: Tympanic membrane, external ear and ear canal normal.  Left Ear: Tympanic  membrane, external ear and ear canal normal.  Nose: Mucosal edema and rhinorrhea present. Right sinus exhibits no maxillary sinus tenderness and no frontal sinus tenderness. Left sinus exhibits no maxillary sinus tenderness and no frontal sinus tenderness.  Mouth/Throat: No uvula swelling. No oropharyngeal exudate, posterior oropharyngeal edema or posterior oropharyngeal erythema.  Eyes: Conjunctivae and EOM are normal. Pupils are equal, round, and reactive to light.  Cardiovascular: Normal rate and regular rhythm.  Exam reveals no gallop, no distant heart sounds and no friction rub.   No murmur heard. Pulmonary/Chest: Effort normal. No respiratory distress. She has no decreased breath sounds. She has no wheezes. She has no rhonchi.  Musculoskeletal:       Right hip: She exhibits normal range of motion, normal strength, no tenderness and no bony tenderness.       Lumbar back: She exhibits normal range of motion and no bony tenderness.  No buttock tenderness.  Mildly erythematous 1cm patches, however no bruising.   Lymphadenopathy:       Head (right side): No submandibular, no tonsillar, no preauricular and no posterior auricular adenopathy present.       Head (left side): No submandibular, no tonsillar, no preauricular and no posterior auricular adenopathy present.  Neurological: She is alert and oriented to person, place, and time.  Skin: She is not diaphoretic.  Psychiatric: She has a normal mood and affect. Her behavior is normal.   Dg Chest 2 View  Result Date: 01/21/2017 CLINICAL DATA:  Cough and chest discomfort. EXAM: CHEST  2 VIEW COMPARISON:  09/07/2015 FINDINGS: The cardiac silhouette, mediastinal and hilar contours are within normal limits and stable. There is moderate tortuosity and calcification of the thoracic aorta. The lungs are clear of an acute process. No pleural effusion. Bony thorax is intact. IMPRESSION: No acute cardiopulmonary findings. Electronically Signed   By: Marijo Sanes M.D.   On: 01/21/2017 10:08     Assessment and Plan: Barbara Dennis is a 83 y.o. female who is here today for cc of cough, and buttock pain.   Fall likely was contusion.  Advised fall precautions and never climbing chairs.   Will treat for possible atypical pneumonia in this elderly patient.  Will refrain from the macrolide at this time. Supportive treatment given.   Lower respiratory infection (e.g., bronchitis, pneumonia, pneumonitis, pulmonitis) - Plan: benzonatate (TESSALON) 100 MG capsule, doxycycline (VIBRAMYCIN) 100 MG capsule  Hyperlipidemia, unspecified hyperlipidemia type - Plan: CBC, Lipid panel  Cough - Plan: DG Chest 2 View  Ivar Drape, PA-C [facility] 4/4/20189:03 AM   Ivar Drape, PA-C [facility]  [facility] 01/22/2017 9:00 AM

## 2017-01-21 NOTE — Patient Instructions (Addendum)
Make sure you are hydrating well with water Continue the fexofenadine.     IF you received an x-ray today, you will receive an invoice from [facility]. Please contact [facility] at [phone] with questions or concerns regarding your invoice.   IF you received labwork today, you will receive an invoice from [facility]. Please contact [facility] at [phone] with questions or concerns regarding your invoice.   Our billing staff will not be able to assist you with questions regarding bills from these companies.  You will be contacted with the lab results as soon as they are available. The fastest way to get your results is to activate your My Chart account. Instructions are located on the last page of this paperwork. If you have not heard from [place] regarding the results in 2 weeks, please contact this office.

## 2017-01-22 LAB — CBC
HEMATOCRIT: 34.3 % (ref 34.0–46.6)
Hemoglobin: 11.1 g/dL (ref 11.1–15.9)
MCH: 30.6 pg (ref 26.6–33.0)
MCHC: 32.4 g/dL (ref 31.5–35.7)
MCV: 95 fL (ref 79–97)
Platelets: 317 10*3/uL (ref 150–379)
RBC: 3.63 x10E6/uL — AB (ref 3.77–5.28)
RDW: 14.9 % (ref 12.3–15.4)
WBC: 6.8 10*3/uL (ref 3.4–10.8)

## 2017-01-22 LAB — LIPID PANEL
CHOL/HDL RATIO: 4.1 ratio (ref 0.0–4.4)
Cholesterol, Total: 234 mg/dL — ABNORMAL HIGH (ref 100–199)
HDL: 57 mg/dL (ref >39)
LDL Calculated: 141 mg/dL — ABNORMAL HIGH (ref 0–99)
Triglycerides: 179 mg/dL — ABNORMAL HIGH (ref 0–149)
VLDL CHOLESTEROL CAL: 36 mg/dL (ref 5–40)

## 2017-02-10 ENCOUNTER — Ambulatory Visit (INDEPENDENT_AMBULATORY_CARE_PROVIDER_SITE_OTHER): Payer: Medicare Other | Admitting: Physician Assistant

## 2017-02-10 ENCOUNTER — Ambulatory Visit (INDEPENDENT_AMBULATORY_CARE_PROVIDER_SITE_OTHER): Payer: Medicare Other

## 2017-02-10 VITALS — BP 120/60 | HR 98 | Temp 97.2°F | Resp 16 | Ht <= 58 in | Wt 100.6 lb

## 2017-02-10 DIAGNOSIS — M7918 Myalgia, other site: Secondary | ICD-10-CM

## 2017-02-10 DIAGNOSIS — M791 Myalgia: Secondary | ICD-10-CM

## 2017-02-10 DIAGNOSIS — M533 Sacrococcygeal disorders, not elsewhere classified: Secondary | ICD-10-CM | POA: Diagnosis not present

## 2017-02-10 MED ORDER — MELOXICAM 7.5 MG PO TABS: 7.5000 mg | ORAL_TABLET | Freq: Every day | ORAL | 0 refills | Status: DC

## 2017-02-10 NOTE — Patient Instructions (Addendum)
This is possible sciatic pain.  We will use an anti-inflammatory at this time.   I also want you to ice it three times per day for 15 minutes.       IF you received an x-ray today, you will receive an invoice from [facility]. Please contact [facility] at [phone] with questions or concerns regarding your invoice.   IF you received labwork today, you will receive an invoice from [facility]. Please contact [facility] at [phone] with questions or concerns regarding your invoice.   Our billing staff will not be able to assist you with questions regarding bills from these companies.  You will be contacted with the lab results as soon as they are available. The fastest way to get your results is to activate your My Chart account. Instructions are located on the last page of this paperwork. If you have not heard from [place] regarding the results in 2 weeks, please contact this office.

## 2017-02-10 NOTE — Progress Notes (Signed)
PRIMARY CARE AT [facility] [address], [place] [address] [phone]  Date:  02/10/2017   Name:  Barbara Dennis   DOB:  November 16, 1933   MRN:  [id number]  PCP:  Ivar Drape, PA    History of Present Illness:  Barbara Dennis is a 83 y.o. female patient who presents to PCP with  Chief Complaint  Patient presents with  . Flank Pain    in right buttock x 1 month     Flank pain at the right buttock for 1 month with continued pain.  Patient states that this was not due to a fall which was reported 1 month ago.  This occurred around the same event, where she was putting something in a garbage can, and suddenly felt a sharp buttock pain.  Pain radiates down the back of the thigh to the knee.  Body aches all over.  No numbness or tingling.  No swelling appreciated.  She was able to drive herself here.     Patient Active Problem List   Diagnosis Date Noted  . Depression with anxiety 01/06/2015  . Cervical adenopathy 03/31/2014  . 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 HTN, goal below 150/90 06/17/2013  . DJD (degenerative joint disease) 02/16/2013  . Tobacco user 09/23/2012  . Menopause 08/21/2012  . Postmenopausal atrophic vaginitis 08/21/2012    Past Medical History:  Diagnosis Date  . Hypertension   . Mass of right side of neck     Past Surgical History:  Procedure Laterality Date  . APPENDECTOMY      Social History  Substance Use Topics  . Smoking status: Current Every Day Smoker    Packs/day: 0.70    Years: 60.00    Types: Cigarettes  . Smokeless tobacco: Never Used  . Alcohol use No    History reviewed. No pertinent family history.  No Known Allergies  Medication list has been reviewed and updated.  Current Outpatient Prescriptions on File Prior to Visit  Medication Sig Dispense Refill  . acetaminophen (TYLENOL ARTHRITIS PAIN) 650 MG CR tablet Take 1 tablet (650 mg total) by mouth every 12 (twelve) hours. For joint pain 180 tablet 3  . albuterol  (PROVENTIL HFA;VENTOLIN HFA) 108 (90 Base) MCG/ACT inhaler Inhale 2 puffs into the lungs every 4 (four) hours as needed for wheezing or shortness of breath (cough, shortness of breath or wheezing.). 1 Inhaler 1  . benzonatate (TESSALON) 100 MG capsule Take 1-2 capsules (100-200 mg total) by mouth 3 (three) times daily as needed for cough. 40 capsule 0  . Calcium Carbonate-Vitamin D3 (CALCIUM 600/VITAMIN D) 600-400 MG-UNIT TABS Take 1 tablet by mouth 2 (two) times daily. 180 tablet 3  . clarithromycin (BIAXIN) 500 MG tablet Take 1 tablet (500 mg total) by mouth 2 (two) times daily. 28 tablet 0  . diclofenac sodium (VOLTAREN) 1 % GEL Apply 4 g topically 4 (four) times daily. 100 g 2  . fexofenadine (ALLEGRA) 30 MG tablet Take 30 mg by mouth 2 (two) times daily.    . Glucos-Chond-Hyal Ac-Ca Fructo (MOVE FREE JOINT HEALTH ADVANCE) TABS Take 1 tablet by mouth 2 (two) times daily. Or as instructed by packaging 60 tablet 3  . lisinopril (PRINIVIL,ZESTRIL) 10 MG tablet Take 1 tablet (10 mg total) by mouth daily. 30 tablet 1  . omeprazole (PRILOSEC) 20 MG capsule Take 2 capsules (40 mg total) by mouth 2 (two) times daily. 30 minutes prior to breakfast 60 capsule 1  . ranitidine (ZANTAC) 150  MG tablet Take 1 tablet (150 mg total) by mouth 2 (two) times daily as needed for heartburn (or indigestion). 180 tablet 3  . tiZANidine (ZANAFLEX) 2 MG tablet Take 1 tablet (2 mg total) by mouth at bedtime. 30 tablet 2  . Triamcinolone Acetonide (TRIAMCINOLONE 0.1 % CREAM : EUCERIN) CREA Apply 1 application topically 2 (two) times daily as needed for rash or itching. 1 each 0   No current facility-administered medications on file prior to visit.     ROS ROS otherwise unremarkable unless listed above.  Physical Examination: BP 120/60 (BP Location: Right Arm, Patient Position: Sitting, Cuff Size: Normal)   Pulse 98   Temp 97.2 F (36.2 C) (Oral)   Resp 16   Ht 4\' 10"  (1.473 m)   Wt 100 lb 9.6 oz (45.6 kg)   SpO2  98%   BMI 21.03 kg/m  Ideal Body Weight: Weight in (lb) to have BMI = 25: 119.4  Physical Exam  Constitutional: She is oriented to person, place, and time. She appears well-developed and well-nourished. No distress.  HENT:  Head: Normocephalic and atraumatic.  Right Ear: External ear normal.  Left Ear: External ear normal.  Eyes: Conjunctivae and EOM are normal. Pupils are equal, round, and reactive to light.  Cardiovascular: Normal rate.   Pulmonary/Chest: Effort normal. No respiratory distress.  Musculoskeletal:       Lumbar back: She exhibits tenderness and bony tenderness (tender along the SI joint and sacrum. ). She exhibits normal range of motion and no swelling.  Normal range  Of motion of the hip.  Normal strength in all planes.   Resisted strength of lower extremities.   Positive straight leg raise test.   Tender along the lower buttock, near piriformis  Neurological: She is alert and oriented to person, place, and time.  Skin: She is not diaphoretic.  Psychiatric: She has a normal mood and affect. Her behavior is normal.   Dg Chest 2 View  Result Date: 01/21/2017 CLINICAL DATA:  Cough and chest discomfort. EXAM: CHEST  2 VIEW COMPARISON:  09/07/2015 FINDINGS: The cardiac silhouette, mediastinal and hilar contours are within normal limits and stable. There is moderate tortuosity and calcification of the thoracic aorta. The lungs are clear of an acute process. No pleural effusion. Bony thorax is intact. IMPRESSION: No acute cardiopulmonary findings. Electronically Signed   By: Marijo Sanes M.D.   On: 01/21/2017 10:08   Dg Si Joints  Result Date: 02/10/2017 CLINICAL DATA:  right sacral/si joint pain upon palpation. antalgic gait EXAM: BILATERAL SACROILIAC JOINTS - 3+ VIEW COMPARISON:  Sacral and coccygeal radiographs -earlier same day FINDINGS: No fracture or dislocation.  Bilateral SI joints appear preserved. Limited visualization the bilateral hips and pubic symphysis is  normal. Multiple dermal calcifications overlie the bilateral buttocks. Vascular calcifications overlie the lower abdomen and pelvis. Large colonic stool burden. IMPRESSION: 1. No evidence of sacroiliitis. 2.  Aortic Atherosclerosis (ICD10-I70.0). Electronically Signed   By: Sandi Mariscal M.D.   On: 02/10/2017 15:32   Dg Sacrum/coccyx  Result Date: 02/10/2017 CLINICAL DATA:  right sacral/si joint pain upon palpation. antalgic gait EXAM: SACRUM AND COCCYX - 2+ VIEW COMPARISON:  Bilateral SI joint radiographs - earlier same day FINDINGS: No displaced sacral or coccygeal fracture. Limited visualization of the bilateral hips, SI joints and pubic symphysis is normal. Multiple dermal calcifications overlie the buttocks bilaterally. Vascular calcifications. Regional bowel gas pattern is normal. IMPRESSION: 1. No definite displaced sacral or coccygeal fracture. 2.  Aortic Atherosclerosis (  ICD10-I70.0). Electronically Signed   By: Sandi Mariscal M.D.   On: 02/10/2017 15:33     Assessment and Plan: SHON MANSOURI is a 83 y.o. female who is here today for cc of right buttock pain. This appears consistent with sciatica vs piriformis pain.   --given anti-infammatory with precautions.  Also advised ice.   --we will hold off prednisone at this time.  --rtc in 1 week.  Right buttock pain - Plan: DG Si Joints, DG Sacrum/Coccyx, meloxicam (MOBIC) 7.5 MG tablet  Ivar Drape, PA-C [facility] 4/25/20188:03 AM

## 2017-02-17 ENCOUNTER — Ambulatory Visit (INDEPENDENT_AMBULATORY_CARE_PROVIDER_SITE_OTHER): Payer: Medicare Other | Admitting: Physician Assistant

## 2017-02-17 ENCOUNTER — Encounter: Payer: Self-pay | Admitting: Physician Assistant

## 2017-02-17 VITALS — BP 144/64 | HR 95 | Temp 98.1°F | Resp 16 | Ht <= 58 in | Wt 102.0 lb

## 2017-02-17 DIAGNOSIS — M5441 Lumbago with sciatica, right side: Secondary | ICD-10-CM

## 2017-02-17 DIAGNOSIS — R109 Unspecified abdominal pain: Secondary | ICD-10-CM | POA: Diagnosis not present

## 2017-02-17 DIAGNOSIS — I10 Essential (primary) hypertension: Secondary | ICD-10-CM

## 2017-02-17 MED ORDER — TRAMADOL HCL 50 MG PO TABS: 50.0000 mg | ORAL_TABLET | Freq: Three times a day (TID) | ORAL | 0 refills | Status: DC | PRN

## 2017-02-17 MED ORDER — METHOCARBAMOL 500 MG PO TABS
500.0000 mg | ORAL_TABLET | Freq: Every evening | ORAL | 0 refills | Status: DC | PRN
Start: 2017-02-17 — End: 2017-04-17

## 2017-02-17 NOTE — Patient Instructions (Addendum)
I want you to ice the area of your back three times per day.   Continue the meloxicam. I am going to get a home health evaluation.  They can see if you need help at your home. I would like you to follow up in 1 week. Be careful with taking the flexeril, as this can make you sedated.

## 2017-02-19 MED ORDER — LISINOPRIL 10 MG PO TABS: 10.0000 mg | ORAL_TABLET | Freq: Every day | ORAL | 1 refills | Status: DC

## 2017-02-19 NOTE — Progress Notes (Signed)
PRIMARY CARE AT [facility] [address] [place] [address] [phone]  Date:  02/17/2017   Name:  Barbara Dennis   DOB:  [birth year]   MRN:  [id number]  PCP:  Ivar Drape, PA    History of Present Illness:  Barbara Dennis is a 83 y.o. female patient who presents to PCP with  Chief Complaint  Patient presents with  . Follow-up    patient states she is still in pain  . buttock pain  . Flank Pain  . Medication Refill    Lisinopril and only wants 1-2 months at a time; states with medication bp is still high     Patient returns for follow up of buttock and flank pain.  She was seen here 7 days ago for buttock pain that was diagnosed as sciatica.  Given anti-inflammatory and advised to ice, with stretches given. She reports that she is having improvement of the buttock pain.  She attempts to do the stretches every now and then and takes medication as advised.  She states that she is now starting to develop side pain on her right side as well.  No weakness.  She states this occurred after she carried her laundry to the basement, and then hung her clothes on a line.  She has no sob or dyspnea.  No numbness or tingling.  She denies any trauma.  She is not standing on ladders, chairs or elevated materials.  She does live alone and handles her chore work alone.  She does have anyone to help her. She has a hx of fall just prior to the sciatica 1 month ago, as she fell of a chair trying to hang a blind.  Patient Active Problem List   Diagnosis Date Noted  . Depression with anxiety 01/06/2015  . Cervical adenopathy 03/31/2014  . 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 HTN, goal below 150/90 06/17/2013  . DJD (degenerative joint disease) 02/16/2013  . Tobacco user 09/23/2012  . Menopause 08/21/2012  . Postmenopausal atrophic vaginitis 08/21/2012    Past Medical History:  Diagnosis Date  . Hypertension   . Mass of right side of neck     Past Surgical History:  Procedure  Laterality Date  . APPENDECTOMY      Social History  Substance Use Topics  . Smoking status: Current Every Day Smoker    Packs/day: 0.70    Years: 60.00    Types: Cigarettes  . Smokeless tobacco: Never Used  . Alcohol use No    History reviewed. No pertinent family history.  No Known Allergies  Medication list has been reviewed and updated.  Current Outpatient Prescriptions on File Prior to Visit  Medication Sig Dispense Refill  . acetaminophen (TYLENOL ARTHRITIS PAIN) 650 MG CR tablet Take 1 tablet (650 mg total) by mouth every 12 (twelve) hours. For joint pain 180 tablet 3  . albuterol (PROVENTIL HFA;VENTOLIN HFA) 108 (90 Base) MCG/ACT inhaler Inhale 2 puffs into the lungs every 4 (four) hours as needed for wheezing or shortness of breath (cough, shortness of breath or wheezing.). 1 Inhaler 1  . Calcium Carbonate-Vitamin D3 (CALCIUM 600/VITAMIN D) 600-400 MG-UNIT TABS Take 1 tablet by mouth 2 (two) times daily. 180 tablet 3  . diclofenac sodium (VOLTAREN) 1 % GEL Apply 4 g topically 4 (four) times daily. 100 g 2  . fexofenadine (ALLEGRA) 30 MG tablet Take 30 mg by mouth 2 (two) times daily.    . Glucos-Chond-Hyal Ac-Ca Fructo (MOVE FREE JOINT  HEALTH ADVANCE) TABS Take 1 tablet by mouth 2 (two) times daily. Or as instructed by packaging 60 tablet 3  . lisinopril (PRINIVIL,ZESTRIL) 10 MG tablet Take 1 tablet (10 mg total) by mouth daily. 30 tablet 1  . meloxicam (MOBIC) 7.5 MG tablet Take 1 tablet (7.5 mg total) by mouth daily. 20 tablet 0  . omeprazole (PRILOSEC) 20 MG capsule Take 2 capsules (40 mg total) by mouth 2 (two) times daily. 30 minutes prior to breakfast 60 capsule 1  . ranitidine (ZANTAC) 150 MG tablet Take 1 tablet (150 mg total) by mouth 2 (two) times daily as needed for heartburn (or indigestion). 180 tablet 3  . tiZANidine (ZANAFLEX) 2 MG tablet Take 1 tablet (2 mg total) by mouth at bedtime. 30 tablet 2   No current facility-administered medications on file prior  to visit.     ROS ROS otherwise unremarkable unless listed above.  Physical Examination: BP (!) 144/64 (BP Location: Left Arm, Patient Position: Sitting, Cuff Size: Small)   Pulse 95   Temp 98.1 F (36.7 C) (Oral)   Resp 16   Ht 4\' 10"  (1.473 m)   Wt 102 lb (46.3 kg)   SpO2 96%   BMI 21.32 kg/m  Ideal Body Weight: Weight in (lb) to have BMI = 25: 119.4  Physical Exam  Constitutional: She is oriented to person, place, and time. She appears well-developed and well-nourished. No distress.  HENT:  Head: Normocephalic and atraumatic.  Right Ear: External ear normal.  Left Ear: External ear normal.  Eyes: Conjunctivae and EOM are normal. Pupils are equal, round, and reactive to light.  Cardiovascular: Normal rate and regular rhythm.  Exam reveals no friction rub.   No murmur heard. Pulmonary/Chest: Effort normal and breath sounds normal. No respiratory distress. She has no wheezes.  Musculoskeletal:  Axillary/ flank tenderness along the ribs without crepitus.  This is also along the anterior area.  No bruises.  Some tenderness along the posterior thoracic musculature.    Neurological: She is alert and oriented to person, place, and time.  Skin: She is not diaphoretic.  Psychiatric: She has a normal mood and affect. Her behavior is normal.     Assessment and Plan: Barbara Dennis is a 83 y.o. female who is here today for follow up of buttock pain, and new finding of right sided thoracic.   --she does have great difficulty at her home with performing her daily activities.  She is requesting to have a health aid.  She has pretty good function, but I am in agreement of this evaluation.  She has had multiple falls, with trying to perform duties in the home, and it may be that assistance can avoid these events.   Refill of the lisinopril.  Recheck in 3 months.  Acute right-sided low back pain with right-sided sciatica - Plan: Ambulatory referral to [place], traMADol (ULTRAM) 50 MG  tablet  Side pain - Plan: traMADol (ULTRAM) 50 MG tablet  Essential hypertension, benign - Plan: lisinopril (PRINIVIL,ZESTRIL) 10 MG tablet  Ivar Drape, PA-C [facility] 5/2/20188:01 AM

## 2017-02-21 ENCOUNTER — Telehealth: Payer: Self-pay | Admitting: Physician Assistant

## 2017-02-21 NOTE — Telephone Encounter (Signed)
THIS MESSAGE IS FROM KECIA FROM [facility]GL[facility]: THE NURSE WILL BE GOING TO SEE THE PATIENT ON Monday 02/24/2017 TO START HER WITH HOME HEALTH CARE. BEST PHONE IF QUESTIONS [phone] (PLEASE ASK FOR KECIA) [place]

## 2017-02-25 ENCOUNTER — Telehealth: Payer: Self-pay | Admitting: Physician Assistant

## 2017-02-25 NOTE — Telephone Encounter (Signed)
[facility] called stating that they went out to pt's house yesterday 02/24/17, but pt was not interested in nursing or physical therapy but wanted to have an aid clean the home and they are unable to only provide this service.

## 2017-02-25 NOTE — Telephone Encounter (Signed)
fyi

## 2017-02-26 NOTE — Telephone Encounter (Signed)
Thank you :)

## 2017-02-27 ENCOUNTER — Ambulatory Visit (INDEPENDENT_AMBULATORY_CARE_PROVIDER_SITE_OTHER): Payer: Medicare Other | Admitting: Physician Assistant

## 2017-02-27 ENCOUNTER — Encounter: Payer: Self-pay | Admitting: Physician Assistant

## 2017-02-27 VITALS — BP 156/72 | HR 95 | Temp 98.2°F | Resp 16 | Ht <= 58 in | Wt 100.8 lb

## 2017-02-27 DIAGNOSIS — M791 Myalgia, unspecified site: Secondary | ICD-10-CM

## 2017-02-27 DIAGNOSIS — M5441 Lumbago with sciatica, right side: Secondary | ICD-10-CM

## 2017-02-27 DIAGNOSIS — M7918 Myalgia, other site: Secondary | ICD-10-CM

## 2017-02-27 MED ORDER — POLYETHYLENE GLYCOL 3350 17 GM/SCOOP PO POWD: 17.0000 g | Freq: Two times a day (BID) | ORAL | 1 refills | Status: DC | PRN

## 2017-02-27 NOTE — Progress Notes (Signed)
PRIMARY CARE AT [facility] [address] [place] [address] [phone]  Date:  02/27/2017   Name:  Barbara Dennis   DOB:  [birth year]   MRN:  [id number]  PCP:  Joretta Bachelor, PA    History of Present Illness:  Barbara Dennis is a 83 y.o. female patient who presents to PCP with  Chief Complaint  Patient presents with  . Follow-up    trouble walking- pt states that both of her legs hurt     Patient is here for follow up of her leg pain.  She was seen here 1 week ago for lower extremity pain, and side pains.  She reports today, that the pain has improved, but she would like a medicine to "make her body feel better".   She has tried one tramadol, and states that she did feel better, but did not take it again.  She is stretching.  No noticeable stretching.   Patient Active Problem List   Diagnosis Date Noted  . Depression with anxiety 01/06/2015  . Cervical adenopathy 03/31/2014  . 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 HTN, goal below 150/90 06/17/2013  . DJD (degenerative joint disease) 02/16/2013  . Tobacco user 09/23/2012  . Menopause 08/21/2012  . Postmenopausal atrophic vaginitis 08/21/2012    Past Medical History:  Diagnosis Date  . Hypertension   . Mass of right side of neck     Past Surgical History:  Procedure Laterality Date  . APPENDECTOMY      Social History  Substance Use Topics  . Smoking status: Current Every Day Smoker    Packs/day: 0.70    Years: 60.00    Types: Cigarettes  . Smokeless tobacco: Never Used  . Alcohol use No    History reviewed. No pertinent family history.  No Known Allergies  Medication list has been reviewed and updated.  Current Outpatient Prescriptions on File Prior to Visit  Medication Sig Dispense Refill  . acetaminophen (TYLENOL ARTHRITIS PAIN) 650 MG CR tablet Take 1 tablet (650 mg total) by mouth every 12 (twelve) hours. For joint pain 180 tablet 3  . albuterol (PROVENTIL HFA;VENTOLIN HFA) 108 (90 Base)  MCG/ACT inhaler Inhale 2 puffs into the lungs every 4 (four) hours as needed for wheezing or shortness of breath (cough, shortness of breath or wheezing.). 1 Inhaler 1  . Calcium Carbonate-Vitamin D3 (CALCIUM 600/VITAMIN D) 600-400 MG-UNIT TABS Take 1 tablet by mouth 2 (two) times daily. 180 tablet 3  . diclofenac sodium (VOLTAREN) 1 % GEL Apply 4 g topically 4 (four) times daily. 100 g 2  . fexofenadine (ALLEGRA) 30 MG tablet Take 30 mg by mouth 2 (two) times daily.    . Glucos-Chond-Hyal Ac-Ca Fructo (MOVE FREE JOINT HEALTH ADVANCE) TABS Take 1 tablet by mouth 2 (two) times daily. Or as instructed by packaging 60 tablet 3  . lisinopril (PRINIVIL,ZESTRIL) 10 MG tablet Take 1 tablet (10 mg total) by mouth daily. 30 tablet 1  . meloxicam (MOBIC) 7.5 MG tablet Take 1 tablet (7.5 mg total) by mouth daily. 20 tablet 0  . methocarbamol (ROBAXIN) 500 MG tablet Take 1 tablet (500 mg total) by mouth at bedtime as needed for muscle spasms. 30 tablet 0  . omeprazole (PRILOSEC) 20 MG capsule Take 2 capsules (40 mg total) by mouth 2 (two) times daily. 30 minutes prior to breakfast 60 capsule 1  . ranitidine (ZANTAC) 150 MG tablet Take 1 tablet (150 mg total) by mouth 2 (two) times daily  as needed for heartburn (or indigestion). 180 tablet 3  . tiZANidine (ZANAFLEX) 2 MG tablet Take 1 tablet (2 mg total) by mouth at bedtime. 30 tablet 2  . traMADol (ULTRAM) 50 MG tablet Take 1 tablet (50 mg total) by mouth every 8 (eight) hours as needed. 30 tablet 0   No current facility-administered medications on file prior to visit.     ROS ROS otherwise unremarkable unless listed above.  Physical Examination: BP (!) 156/72 (BP Location: Right Arm, Patient Position: Sitting, Cuff Size: Small)   Pulse 95   Temp 98.2 F (36.8 C) (Oral)   Resp 16   Ht 4\' 10"  (1.473 m)   Wt 100 lb 12.8 oz (45.7 kg)   SpO2 95%   BMI 21.07 kg/m  Ideal Body Weight: Weight in (lb) to have BMI = 25: 119.4  Physical Exam   Constitutional: She is oriented to person, place, and time. She appears well-developed and well-nourished. No distress.  HENT:  Head: Normocephalic and atraumatic.  Right Ear: External ear normal.  Left Ear: External ear normal.  Eyes: Conjunctivae and EOM are normal. Pupils are equal, round, and reactive to light.  Cardiovascular: Normal rate.   Pulmonary/Chest: Effort normal. No respiratory distress.  Musculoskeletal:       Right shoulder: She exhibits normal range of motion, no tenderness, no bony tenderness, no swelling, no spasm, normal pulse and normal strength.  Neurological: She is alert and oriented to person, place, and time.  Skin: She is not diaphoretic.  Psychiatric: She has a normal mood and affect. Her behavior is normal.     Assessment and Plan: Barbara Dennis is a 83 y.o. female who is here today for follow up of extremity pain. Right buttock pain  Acute right-sided low back pain with right-sided sciatica  Myalgia  Ivar Drape, PA-C [facility] 5/12/20188:08 AM

## 2017-02-27 NOTE — Patient Instructions (Addendum)
You can try taking the tramadol for pain.  You can try to cut it in half and see if it is better that way.   I ordered miralax for your constipation.  You can do this once daily.  17g of the miralax powder in 8 ounces of water.     IF you received an x-ray today, you will receive an invoice from [facility]. Please contact [facility] at [phone] with questions or concerns regarding your invoice.   IF you received labwork today, you will receive an invoice from [place]. Please contact [facility] at [phone] with questions or concerns regarding your invoice.   Our billing staff will not be able to assist you with questions regarding bills from these companies.  You will be contacted with the lab results as soon as they are available. The fastest way to get your results is to activate your My Chart account. Instructions are located on the last page of this paperwork. If you have not heard from [place] regarding the results in 2 weeks, please contact this office.

## 2017-03-03 ENCOUNTER — Encounter: Payer: Self-pay | Admitting: Emergency Medicine

## 2017-03-03 ENCOUNTER — Ambulatory Visit (INDEPENDENT_AMBULATORY_CARE_PROVIDER_SITE_OTHER): Payer: Medicare Other

## 2017-03-03 ENCOUNTER — Ambulatory Visit (INDEPENDENT_AMBULATORY_CARE_PROVIDER_SITE_OTHER): Payer: Medicare Other | Admitting: Emergency Medicine

## 2017-03-03 VITALS — BP 120/58 | HR 97 | Temp 98.1°F | Resp 17 | Ht <= 58 in | Wt 102.0 lb

## 2017-03-03 DIAGNOSIS — S6992XA Unspecified injury of left wrist, hand and finger(s), initial encounter: Secondary | ICD-10-CM

## 2017-03-03 DIAGNOSIS — S60222A Contusion of left hand, initial encounter: Secondary | ICD-10-CM

## 2017-03-03 MED ORDER — DICLOFENAC SODIUM 75 MG PO TBEC: 75.0000 mg | DELAYED_RELEASE_TABLET | Freq: Two times a day (BID) | ORAL | 0 refills | Status: AC

## 2017-03-03 NOTE — Progress Notes (Signed)
Barbara Dennis 83 y.o.   Chief Complaint  Patient presents with  . Hand Injury    Left hand/ closed hand in car door 5 days ago    HISTORY OF PRESENT ILLNESS: This is a 83 y.o. female injured left hand 5 days ago.  Hand Injury   The incident occurred 5 to 7 days ago. The injury mechanism was a direct blow. The pain is present in the left hand. The quality of the pain is described as aching. The pain radiates to the left arm. The pain is at a severity of 5/10. The pain is moderate. The pain has been fluctuating since the incident. Pertinent negatives include no chest pain, numbness or tingling. The symptoms are aggravated by movement.     Prior to Admission medications   Medication Sig Start Date End Date Taking? Authorizing Provider  acetaminophen (TYLENOL ARTHRITIS PAIN) 650 MG CR tablet Take 1 tablet (650 mg total) by mouth every 12 (twelve) hours. For joint pain 02/22/16  Yes Barbara Knapp, MD  albuterol (PROVENTIL HFA;VENTOLIN HFA) 108 (90 Base) MCG/ACT inhaler Inhale 2 puffs into the lungs every 4 (four) hours as needed for wheezing or shortness of breath (cough, shortness of breath or wheezing.). 01/21/17  Yes Barbara Dennis, Barbara Maryland D, PA  Calcium Carbonate-Vitamin D3 (CALCIUM 600/VITAMIN Dennis) 600-400 MG-UNIT TABS Take 1 tablet by mouth 2 (two) times daily. 02/22/16  Yes Barbara Knapp, MD  diclofenac sodium (VOLTAREN) 1 % GEL Apply 4 g topically 4 (four) times daily. 02/22/16  Yes Barbara Knapp, MD  fexofenadine (ALLEGRA) 30 MG tablet Take 30 mg by mouth 2 (two) times daily.   Yes [provider]  Glucos-Chond-Hyal Ac-Ca Fructo (MOVE FREE JOINT HEALTH ADVANCE) TABS Take 1 tablet by mouth 2 (two) times daily. Or as instructed by packaging 02/22/16  Yes Barbara Knapp, MD  lisinopril (PRINIVIL,ZESTRIL) 10 MG tablet Take 1 tablet (10 mg total) by mouth daily. 02/19/17  Yes Barbara Dennis, Barbara Maryland D, PA  meloxicam (MOBIC) 7.5 MG tablet Take 1 tablet (7.5 mg total) by mouth daily. 02/10/17  Yes Barbara Dennis,  Barbara Maryland D, PA  methocarbamol (ROBAXIN) 500 MG tablet Take 1 tablet (500 mg total) by mouth at bedtime as needed for muscle spasms. 02/17/17  Yes Barbara Dennis, Barbara Maryland D, PA  omeprazole (PRILOSEC) 20 MG capsule Take 2 capsules (40 mg total) by mouth 2 (two) times daily. 30 minutes prior to breakfast 04/08/16  Yes Barbara Knapp, MD  polyethylene glycol powder (GLYCOLAX/MIRALAX) powder Take 17 g by mouth 2 (two) times daily as needed. 02/27/17  Yes Barbara Dennis, Barbara Maryland D, PA  ranitidine (ZANTAC) 150 MG tablet Take 1 tablet (150 mg total) by mouth 2 (two) times daily as needed for heartburn (or indigestion). 02/22/16  Yes Barbara Knapp, MD  tiZANidine (ZANAFLEX) 2 MG tablet Take 1 tablet (2 mg total) by mouth at bedtime. 02/22/16  Yes Barbara Knapp, MD  traMADol (ULTRAM) 50 MG tablet Take 1 tablet (50 mg total) by mouth every 8 (eight) hours as needed. 02/17/17  Yes Barbara Dennis D, PA    No Known Allergies  Patient Active Problem List   Diagnosis Date Noted  . Depression with anxiety 01/06/2015  . Cervical adenopathy 03/31/2014  . 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 HTN, goal below 150/90 06/17/2013  . DJD (degenerative joint disease) 02/16/2013  . Tobacco user 09/23/2012  . Menopause 08/21/2012  . Postmenopausal atrophic vaginitis 08/21/2012    Past Medical History:  Diagnosis Date  .  Hypertension   . Mass of right side of neck     Past Surgical History:  Procedure Laterality Date  . APPENDECTOMY      Social History   Social History  . Marital status: Widowed    Spouse name: N/A  . Number of children: N/A  . Years of education: N/A   Occupational History  . Not on file.   Social History Main Topics  . Smoking status: Current Every Day Smoker    Packs/day: 0.70    Years: 60.00    Types: Cigarettes  . Smokeless tobacco: Never Used  . Alcohol use No  . Drug use: No  . Sexual activity: Not on file   Other Topics Concern  . Not on file   Social History Narrative    Marital status: widowed 28 years ago.  From [place]; moved to [place] 1974.     Children: none       Lives:  Alone; brother in law is Psychologist, sport and exercise who is 50.      Employment: retired.       Tobacco:  1/2 ppd       Alcohol:  None      Exercise:  Walking daily.      ADLs: no driving.    No family history on file.   Review of Systems  Constitutional: Negative for chills and fever.  Respiratory: Negative for shortness of breath.   Cardiovascular: Negative for chest pain.  Gastrointestinal: Negative for nausea and vomiting.  Musculoskeletal: Positive for joint pain (left wrist).  Skin: Negative for rash.       +bruising  Neurological: Negative for tingling and numbness.  All other systems reviewed and are negative.  Vitals:   03/03/17 1455 03/03/17 1537  BP: (!) 172/69 (!) 120/58  Pulse: 97   Resp: 17   Temp: 98.1 F (36.7 C)      Physical Exam  Constitutional: She is oriented to person, place, and time. She appears well-developed and well-nourished.  HENT:  Head: Normocephalic and atraumatic.  Eyes: Pupils are equal, round, and reactive to light.  Neck: Normal range of motion.  Cardiovascular: Normal rate.   Pulmonary/Chest: Effort normal.  Musculoskeletal:  LUE: +bruising to proximal thumb/index fingers area; FROM and NVI.  Neurological: She is alert and oriented to person, place, and time.  Skin: Skin is warm and dry. Capillary refill takes less than 2 seconds.  Psychiatric: She has a normal mood and affect. Her behavior is normal.  Vitals reviewed.  Xray reviewed: No Fx  ASSESSMENT & PLAN: Barbara Dennis was seen today for hand injury.  Diagnoses and all orders for this visit:  Injury of left hand, initial encounter -     DG Hand Complete Left; Future  Contusion of left hand, initial encounter    Patient Instructions       IF you received an x-ray today, you will receive an invoice from [facility]. Please contact [facility] at [phone] with  questions or concerns regarding your invoice.   IF you received labwork today, you will receive an invoice from [place]. Please contact [facility] at [phone] with questions or concerns regarding your invoice.   Our billing staff will not be able to assist you with questions regarding bills from these companies.  You will be contacted with the lab results as soon as they are available. The fastest way to get your results is to activate your My Chart account. Instructions are located on the last page of this paperwork.  If you have not heard from [place] regarding the results in 2 weeks, please contact this office.     Crush Injury of the Hand A crush injury of the hand happens when a great amount of force is suddenly applied to your hand. For example, this might happen if a heavy load falls on your hand. This injury can damage your skin and many parts (structures) in the hand and wrist joint. Treatment will depend on which parts are damaged and how severe your injury is. Follow these instructions at home: If you have a splint:   Wear the splint as told by your doctor. Remove it only as told by your doctor.  Loosen the splint if your fingers tingle, get numb, or turn cold and blue.  Do not let your splint get wet if it is not waterproof.  Keep the splint clean. Wound care    If you have any skin wounds that were covered with bandages (dressings), follow instructions from your doctor about how to take care of your wounds. Make sure you:  Wash your hands with soap and water before you change your bandage. If you cannot use soap and water, use hand sanitizer.  Change your bandage as told by your doctor.  Leave stitches (sutures), skin glue, or skin tape (adhesive) strips in place. They may need to stay in place for 2 weeks or longer. If tape strips get loose and curl up, you may trim the loose edges. Do not remove tape strips completely unless your doctor says it is okay.  If you have skin  wounds, check them every day for signs of infection. Check for:  More redness, swelling, or pain.  More fluid or blood.  Warmth.  Pus or a bad smell. Managing pain, stiffness, and swelling   If directed, put ice on the injured area.  Put ice in a plastic bag.  Place a towel between your skin and the bag.  Leave the ice on for 20 minutes, 2-3 times a day.  Raise (elevate) the injured area above the level of your heart while you are sitting or lying down. Driving   Do not drive or use heavy machinery while taking prescription pain medicine.  Ask your doctor when it is safe to drive if you have a splint on your hand or arm. Activity   Return to your normal activities as told by your doctor. Ask your doctor what activities are safe for you.  Work with a physical therapist (PT) or occupational therapist (OT) as told by your doctor. General instructions   Do not put pressure on any part of the splint until it is fully hardened. This may take many hours.  If you have a splint and it is not waterproof, cover it with a watertight plastic bag when you take a bath or a shower.  Take over-the-counter and prescription medicines only as told by your doctor.  If you were prescribed an antibiotic, take it as told by your doctor. Do not stop taking the antibiotic before the prescription is done.  Do not use any tobacco products, such as cigarettes, chewing tobacco, and e-cigarettes. If you need help quitting, ask your doctor.  Keep all follow-up visits as told by your doctor. This is important. These include PT and OT visits. Contact a doctor if:  A wound with stitches opens up.  You have more redness, swelling, or pain in your hand.  You have more fluid or blood coming from your hand.  Your hand feels warm to the touch.  You have pus or a bad smell coming from your hand.  You have a fever. Get help right away if:  You suddenly have very bad pain in your hand.  You had  feeling (sensation) in your hand before but you suddenly lose feeling.  Your wrist or hand becomes bent (contracted) without you trying to bend it.  Your symptoms had gotten better and they suddenly get worse.  Your hand or fingers are turning pink or blue. This information is not intended to replace advice given to you by your health care provider. Make sure you discuss any questions you have with your health care provider. Document Released: 03/27/2010 Document Revised: 03/14/2016 Document Reviewed: 05/31/2015 Elsevier Interactive Patient Education  2017 Elsevier Inc.      Agustina Caroli, MD [facility]

## 2017-03-03 NOTE — Addendum Note (Signed)
Addended by: Davina Poke on: 03/03/2017 04:22 PM   Modules accepted: Orders

## 2017-03-03 NOTE — Patient Instructions (Addendum)
IF you received an x-ray today, you will receive an invoice from [facility]. Please contact [facility] at [phone] with questions or concerns regarding your invoice.   IF you received labwork today, you will receive an invoice from [place]. Please contact [facility] at [phone] with questions or concerns regarding your invoice.   Our billing staff will not be able to assist you with questions regarding bills from these companies.  You will be contacted with the lab results as soon as they are available. The fastest way to get your results is to activate your My Chart account. Instructions are located on the last page of this paperwork. If you have not heard from [place] regarding the results in 2 weeks, please contact this office.     Crush Injury of the Hand A crush injury of the hand happens when a great amount of force is suddenly applied to your hand. For example, this might happen if a heavy load falls on your hand. This injury can damage your skin and many parts (structures) in the hand and wrist joint. Treatment will depend on which parts are damaged and how severe your injury is. Follow these instructions at home: If you have a splint:   Wear the splint as told by your doctor. Remove it only as told by your doctor.  Loosen the splint if your fingers tingle, get numb, or turn cold and blue.  Do not let your splint get wet if it is not waterproof.  Keep the splint clean. Wound care    If you have any skin wounds that were covered with bandages (dressings), follow instructions from your doctor about how to take care of your wounds. Make sure you:  Wash your hands with soap and water before you change your bandage. If you cannot use soap and water, use hand sanitizer.  Change your bandage as told by your doctor.  Leave stitches (sutures), skin glue, or skin tape (adhesive) strips in place. They may need to stay in place for 2 weeks or longer. If tape  strips get loose and curl up, you may trim the loose edges. Do not remove tape strips completely unless your doctor says it is okay.  If you have skin wounds, check them every day for signs of infection. Check for:  More redness, swelling, or pain.  More fluid or blood.  Warmth.  Pus or a bad smell. Managing pain, stiffness, and swelling   If directed, put ice on the injured area.  Put ice in a plastic bag.  Place a towel between your skin and the bag.  Leave the ice on for 20 minutes, 2-3 times a day.  Raise (elevate) the injured area above the level of your heart while you are sitting or lying down. Driving   Do not drive or use heavy machinery while taking prescription pain medicine.  Ask your doctor when it is safe to drive if you have a splint on your hand or arm. Activity   Return to your normal activities as told by your doctor. Ask your doctor what activities are safe for you.  Work with a physical therapist (PT) or occupational therapist (OT) as told by your doctor. General instructions   Do not put pressure on any part of the splint until it is fully hardened. This may take many hours.  If you have a splint and it is not waterproof, cover it with a watertight plastic bag when you take a bath or  a shower.  Take over-the-counter and prescription medicines only as told by your doctor.  If you were prescribed an antibiotic, take it as told by your doctor. Do not stop taking the antibiotic before the prescription is done.  Do not use any tobacco products, such as cigarettes, chewing tobacco, and e-cigarettes. If you need help quitting, ask your doctor.  Keep all follow-up visits as told by your doctor. This is important. These include PT and OT visits. Contact a doctor if:  A wound with stitches opens up.  You have more redness, swelling, or pain in your hand.  You have more fluid or blood coming from your hand.  Your hand feels warm to the touch.  You have  pus or a bad smell coming from your hand.  You have a fever. Get help right away if:  You suddenly have very bad pain in your hand.  You had feeling (sensation) in your hand before but you suddenly lose feeling.  Your wrist or hand becomes bent (contracted) without you trying to bend it.  Your symptoms had gotten better and they suddenly get worse.  Your hand or fingers are turning pink or blue. This information is not intended to replace advice given to you by your health care provider. Make sure you discuss any questions you have with your health care provider. Document Released: 03/27/2010 Document Revised: 03/14/2016 Document Reviewed: 05/31/2015 Elsevier Interactive Patient Education  2017 [employer].

## 2017-03-10 ENCOUNTER — Encounter: Payer: Self-pay | Admitting: Emergency Medicine

## 2017-03-10 ENCOUNTER — Ambulatory Visit (INDEPENDENT_AMBULATORY_CARE_PROVIDER_SITE_OTHER): Payer: Medicare Other | Admitting: Emergency Medicine

## 2017-03-10 VITALS — BP 158/71 | HR 86 | Temp 97.4°F | Resp 16 | Ht 58.5 in | Wt 103.0 lb

## 2017-03-10 DIAGNOSIS — M5432 Sciatica, left side: Secondary | ICD-10-CM | POA: Insufficient documentation

## 2017-03-10 MED ORDER — DICLOFENAC SODIUM 75 MG PO TBEC: 75.0000 mg | DELAYED_RELEASE_TABLET | Freq: Two times a day (BID) | ORAL | 0 refills | Status: AC

## 2017-03-10 NOTE — Patient Instructions (Addendum)
IF you received an x-ray today, you will receive an invoice from [facility]. Please contact [facility] at [phone] with questions or concerns regarding your invoice.   IF you received labwork today, you will receive an invoice from [place]. Please contact [facility] at [phone] with questions or concerns regarding your invoice.   Our billing staff will not be able to assist you with questions regarding bills from these companies.  You will be contacted with the lab results as soon as they are available. The fastest way to get your results is to activate your My Chart account. Instructions are located on the last page of this paperwork. If you have not heard from [place] regarding the results in 2 weeks, please contact this office.     Sciatica Sciatica is pain, numbness, weakness, or tingling along your sciatic nerve. The sciatic nerve starts in the lower back and goes down the back of each leg. Sciatica happens when this nerve is pinched or has pressure put on it. Sciatica usually goes away on its own or with treatment. Sometimes, sciatica may keep coming back (recur). Follow these instructions at home: Medicines   Take over-the-counter and prescription medicines only as told by your doctor.  Do not drive or use heavy machinery while taking prescription pain medicine. Managing pain   If directed, put ice on the affected area.  Put ice in a plastic bag.  Place a towel between your skin and the bag.  Leave the ice on for 20 minutes, 2-3 times a day.  After icing, apply heat to the affected area before you exercise or as often as told by your doctor. Use the heat source that your doctor tells you to use, such as a moist heat pack or a heating pad.  Place a towel between your skin and the heat source.  Leave the heat on for 20-30 minutes.  Remove the heat if your skin turns bright red. This is especially important if you are unable to feel pain, heat, or  cold. You may have a greater risk of getting burned. Activity   Return to your normal activities as told by your doctor. Ask your doctor what activities are safe for you.  Avoid activities that make your sciatica worse.  Take short rests during the day. Rest in a lying or standing position. This is usually better than sitting to rest.  When you rest for a long time, do some physical activity or stretching between periods of rest.  Avoid sitting for a long time without moving. Get up and move around at least one time each hour.  Exercise and stretch regularly, as told by your doctor.  Do not lift anything that is heavier than 10 lb (4.5 kg) while you have symptoms of sciatica.  Avoid lifting heavy things even when you do not have symptoms.  Avoid lifting heavy things over and over.  When you lift objects, always lift in a way that is safe for your body. To do this, you should:  Bend your knees.  Keep the object close to your body.  Avoid twisting. General instructions   Use good posture.  Avoid leaning forward when you are sitting.  Avoid hunching over when you are standing.  Stay at a healthy weight.  Wear comfortable shoes that support your feet. Avoid wearing high heels.  Avoid sleeping on a mattress that is too soft or too hard. You might have less pain if you sleep on a mattress  that is firm enough to support your back.  Keep all follow-up visits as told by your doctor. This is important. Contact a doctor if:  You have pain that:  Wakes you up when you are sleeping.  Gets worse when you lie down.  Is worse than the pain you have had in the past.  Lasts longer than 4 weeks.  You lose weight for without trying. Get help right away if:  You cannot control when you pee (urinate) or poop (have a bowel movement).  You have weakness in any of these areas and it gets worse.  Lower back.  Lower belly (pelvis).  Butt (buttocks).  Legs.  You have redness  or swelling of your back.  You have a burning feeling when you pee. This information is not intended to replace advice given to you by your health care provider. Make sure you discuss any questions you have with your health care provider. Document Released: 07/16/2008 Document Revised: 03/14/2016 Document Reviewed: 06/16/2015 Elsevier Interactive Patient Education  2017 [employer].

## 2017-03-10 NOTE — Progress Notes (Signed)
Barbara Dennis 83 y.o.   Chief Complaint  Patient presents with  . Leg Pain    bilateral, worse on L side per patient.    HISTORY OF PRESENT ILLNESS: This is a 83 y.o. female complaining of left sided low back pain radiating down the left leg x several days; like an electrical shock down the leg. No other significant symptoms.  HPI   Prior to Admission medications   Medication Sig Start Date End Date Taking? Authorizing Provider  acetaminophen (TYLENOL ARTHRITIS PAIN) 650 MG CR tablet Take 1 tablet (650 mg total) by mouth every 12 (twelve) hours. For joint pain 02/22/16  Yes Shawnee Knapp, MD  albuterol (PROVENTIL HFA;VENTOLIN HFA) 108 (90 Base) MCG/ACT inhaler Inhale 2 puffs into the lungs every 4 (four) hours as needed for wheezing or shortness of breath (cough, shortness of breath or wheezing.). 01/21/17  Yes English, Colletta Maryland D, PA  Calcium Carbonate-Vitamin D3 (CALCIUM 600/VITAMIN D) 600-400 MG-UNIT TABS Take 1 tablet by mouth 2 (two) times daily. 02/22/16  Yes Shawnee Knapp, MD  lisinopril (PRINIVIL,ZESTRIL) 10 MG tablet Take 1 tablet (10 mg total) by mouth daily. 02/19/17  Yes English, Colletta Maryland D, PA  meloxicam (MOBIC) 7.5 MG tablet Take 1 tablet (7.5 mg total) by mouth daily. 02/10/17  Yes English, Colletta Maryland D, PA  methocarbamol (ROBAXIN) 500 MG tablet Take 1 tablet (500 mg total) by mouth at bedtime as needed for muscle spasms. 02/17/17  Yes English, Colletta Maryland D, PA  omeprazole (PRILOSEC) 20 MG capsule Take 2 capsules (40 mg total) by mouth 2 (two) times daily. 30 minutes prior to breakfast 04/08/16  Yes Shawnee Knapp, MD  ranitidine (ZANTAC) 150 MG tablet Take 1 tablet (150 mg total) by mouth 2 (two) times daily as needed for heartburn (or indigestion). 02/22/16  Yes Shawnee Knapp, MD  tiZANidine (ZANAFLEX) 2 MG tablet Take 1 tablet (2 mg total) by mouth at bedtime. 02/22/16  Yes Shawnee Knapp, MD  diclofenac sodium (VOLTAREN) 1 % GEL Apply 4 g topically 4 (four) times daily. 02/22/16   Shawnee Knapp, MD    fexofenadine (ALLEGRA) 30 MG tablet Take 30 mg by mouth 2 (two) times daily.    [provider]  Glucos-Chond-Hyal Ac-Ca Fructo (MOVE FREE JOINT HEALTH ADVANCE) TABS Take 1 tablet by mouth 2 (two) times daily. Or as instructed by packaging Patient not taking: Reported on 03/10/2017 02/22/16   Shawnee Knapp, MD  polyethylene glycol powder (GLYCOLAX/MIRALAX) powder Take 17 g by mouth 2 (two) times daily as needed. 02/27/17   Ivar Drape D, PA  traMADol (ULTRAM) 50 MG tablet Take 1 tablet (50 mg total) by mouth every 8 (eight) hours as needed. Patient not taking: Reported on 03/10/2017 02/17/17   Ivar Drape D, PA    No Known Allergies  Patient Active Problem List   Diagnosis Date Noted  . Depression with anxiety 01/06/2015  . Cervical adenopathy 03/31/2014  . 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 HTN, goal below 150/90 06/17/2013  . DJD (degenerative joint disease) 02/16/2013  . Tobacco user 09/23/2012  . Menopause 08/21/2012  . Postmenopausal atrophic vaginitis 08/21/2012    Past Medical History:  Diagnosis Date  . Hypertension   . Mass of right side of neck     Past Surgical History:  Procedure Laterality Date  . APPENDECTOMY      Social History   Social History  . Marital status: Widowed    Spouse name: N/A  .  Number of children: N/A  . Years of education: N/A   Occupational History  . Not on file.   Social History Main Topics  . Smoking status: Current Every Day Smoker    Packs/day: 0.70    Years: 60.00    Types: Cigarettes  . Smokeless tobacco: Never Used  . Alcohol use No  . Drug use: No  . Sexual activity: Not on file   Other Topics Concern  . Not on file   Social History Narrative   Marital status: widowed 28 years ago.  From [place]; moved to [place] 1974.     Children: none       Lives:  Alone; brother in law is Psychologist, sport and exercise who is 68.      Employment: retired.       Tobacco:  1/2 ppd       Alcohol:  None      Exercise:  Walking  daily.      ADLs: no driving.    No family history on file.   Review of Systems  Constitutional: Negative.  Negative for chills and fever.  HENT: Negative.   Eyes: Negative.   Respiratory: Negative for cough and shortness of breath.   Cardiovascular: Negative for chest pain, palpitations and leg swelling.  Gastrointestinal: Negative for abdominal pain, diarrhea, nausea and vomiting.  Genitourinary: Negative for dysuria and hematuria.  Musculoskeletal: Positive for back pain. Negative for joint pain and neck pain.  Skin: Negative.  Negative for rash.  Neurological: Negative for dizziness, sensory change, focal weakness and headaches.  Endo/Heme/Allergies: Negative.   All other systems reviewed and are negative.  Vitals:   03/10/17 1632  BP: (!) 158/71  Pulse: 86  Resp: 16  Temp: 97.4 F (36.3 C)     Physical Exam  Constitutional: She is oriented to person, place, and time. She appears well-developed and well-nourished.  HENT:  Head: Normocephalic and atraumatic.  Nose: Nose normal.  Mouth/Throat: Oropharynx is clear and moist. No oropharyngeal exudate.  Eyes: Conjunctivae and EOM are normal. Pupils are equal, round, and reactive to light.  Neck: Normal range of motion. Neck supple. No JVD present. No thyromegaly present.  Cardiovascular: Normal rate, regular rhythm, normal heart sounds and intact distal pulses.   Pulmonary/Chest: Effort normal and breath sounds normal.  Abdominal: Soft. Bowel sounds are normal. She exhibits no distension. There is no tenderness.  Musculoskeletal: Normal range of motion.  Neurological: She is alert and oriented to person, place, and time. She displays normal reflexes. No sensory deficit. She exhibits normal muscle tone. Coordination normal.  Skin: Skin is warm and dry. Capillary refill takes less than 2 seconds. No rash noted.  Psychiatric: She has a normal mood and affect. Her behavior is normal.  Vitals reviewed.    ASSESSMENT &  PLAN: Barbara Dennis was seen today for leg pain.  Diagnoses and all orders for this visit:  Sciatica of left side  Other orders -     diclofenac (VOLTAREN) 75 MG EC tablet; Take 1 tablet (75 mg total) by mouth 2 (two) times daily.    Patient Instructions       IF you received an x-ray today, you will receive an invoice from [facility]. Please contact [facility] at [phone] with questions or concerns regarding your invoice.   IF you received labwork today, you will receive an invoice from [place]. Please contact [facility] at [phone] with questions or concerns regarding your invoice.   Our billing staff will not be able to  assist you with questions regarding bills from these companies.  You will be contacted with the lab results as soon as they are available. The fastest way to get your results is to activate your My Chart account. Instructions are located on the last page of this paperwork. If you have not heard from [place] regarding the results in 2 weeks, please contact this office.     Sciatica Sciatica is pain, numbness, weakness, or tingling along your sciatic nerve. The sciatic nerve starts in the lower back and goes down the back of each leg. Sciatica happens when this nerve is pinched or has pressure put on it. Sciatica usually goes away on its own or with treatment. Sometimes, sciatica may keep coming back (recur). Follow these instructions at home: Medicines   Take over-the-counter and prescription medicines only as told by your doctor.  Do not drive or use heavy machinery while taking prescription pain medicine. Managing pain   If directed, put ice on the affected area.  Put ice in a plastic bag.  Place a towel between your skin and the bag.  Leave the ice on for 20 minutes, 2-3 times a day.  After icing, apply heat to the affected area before you exercise or as often as told by your doctor. Use the heat source that your doctor tells you to  use, such as a moist heat pack or a heating pad.  Place a towel between your skin and the heat source.  Leave the heat on for 20-30 minutes.  Remove the heat if your skin turns bright red. This is especially important if you are unable to feel pain, heat, or cold. You may have a greater risk of getting burned. Activity   Return to your normal activities as told by your doctor. Ask your doctor what activities are safe for you.  Avoid activities that make your sciatica worse.  Take short rests during the day. Rest in a lying or standing position. This is usually better than sitting to rest.  When you rest for a long time, do some physical activity or stretching between periods of rest.  Avoid sitting for a long time without moving. Get up and move around at least one time each hour.  Exercise and stretch regularly, as told by your doctor.  Do not lift anything that is heavier than 10 lb (4.5 kg) while you have symptoms of sciatica.  Avoid lifting heavy things even when you do not have symptoms.  Avoid lifting heavy things over and over.  When you lift objects, always lift in a way that is safe for your body. To do this, you should:  Bend your knees.  Keep the object close to your body.  Avoid twisting. General instructions   Use good posture.  Avoid leaning forward when you are sitting.  Avoid hunching over when you are standing.  Stay at a healthy weight.  Wear comfortable shoes that support your feet. Avoid wearing high heels.  Avoid sleeping on a mattress that is too soft or too hard. You might have less pain if you sleep on a mattress that is firm enough to support your back.  Keep all follow-up visits as told by your doctor. This is important. Contact a doctor if:  You have pain that:  Wakes you up when you are sleeping.  Gets worse when you lie down.  Is worse than the pain you have had in the past.  Lasts longer than 4 weeks.  You lose  weight for  without trying. Get help right away if:  You cannot control when you pee (urinate) or poop (have a bowel movement).  You have weakness in any of these areas and it gets worse.  Lower back.  Lower belly (pelvis).  Butt (buttocks).  Legs.  You have redness or swelling of your back.  You have a burning feeling when you pee. This information is not intended to replace advice given to you by your health care provider. Make sure you discuss any questions you have with your health care provider. Document Released: 07/16/2008 Document Revised: 03/14/2016 Document Reviewed: 06/16/2015 Elsevier Interactive Patient Education  2017 [employer]      Agustina Caroli, MD [facility]

## 2017-04-17 ENCOUNTER — Other Ambulatory Visit: Payer: Self-pay | Admitting: Physician Assistant

## 2017-05-01 ENCOUNTER — Encounter: Payer: Self-pay | Admitting: Emergency Medicine

## 2017-05-01 ENCOUNTER — Ambulatory Visit (INDEPENDENT_AMBULATORY_CARE_PROVIDER_SITE_OTHER): Payer: Medicare Other | Admitting: Emergency Medicine

## 2017-05-01 VITALS — BP 138/60 | HR 89 | Temp 99.8°F | Resp 16 | Ht <= 58 in

## 2017-05-01 DIAGNOSIS — M79604 Pain in right leg: Secondary | ICD-10-CM

## 2017-05-01 DIAGNOSIS — M79605 Pain in left leg: Secondary | ICD-10-CM

## 2017-05-01 DIAGNOSIS — M791 Myalgia: Secondary | ICD-10-CM

## 2017-05-01 DIAGNOSIS — R399 Unspecified symptoms and signs involving the genitourinary system: Secondary | ICD-10-CM

## 2017-05-01 DIAGNOSIS — M7918 Myalgia, other site: Secondary | ICD-10-CM

## 2017-05-01 LAB — POCT URINALYSIS DIPSTICK
BILIRUBIN UA: NEGATIVE
Glucose, UA: NEGATIVE
KETONES UA: NEGATIVE
Nitrite, UA: NEGATIVE
Protein, UA: NEGATIVE
SPEC GRAV UA: 1.02 (ref 1.010–1.025)
Urobilinogen, UA: 0.2 E.U./dL
pH, UA: 7.5 (ref 5.0–8.0)

## 2017-05-01 LAB — POCT UA - MICROSCOPIC ONLY: MUCUS UA: ABSENT

## 2017-05-01 MED ORDER — TRAMADOL HCL 50 MG PO TABS: 50.0000 mg | ORAL_TABLET | Freq: Three times a day (TID) | ORAL | 0 refills | Status: DC | PRN

## 2017-05-01 NOTE — Patient Instructions (Addendum)
     IF you received an x-ray today, you will receive an invoice from [facility] [place] [facility] [facility]. Please contact [facility] at [phone] with questions or concerns regarding your invoice.   IF you received labwork today, you will receive an invoice from [facility]. Please contact [facility] at [phone] with questions or concerns regarding your invoice.   Our billing staff will not be able to assist you with questions regarding bills from these companies.  You will be contacted with the lab results as soon as they are available. The fastest way to get your results is to activate your My Chart account. Instructions are located on the last page of this paperwork. If you have not heard from us regarding the results in 2 weeks, please contact this office.     Leg Cramps Leg cramps occur when a muscle or muscles tighten and you have no control over this tightening (involuntary muscle contraction). Muscle cramps can develop in any muscle, but the most common place is in the calf muscles of the leg. Those cramps can occur during exercise or when you are at rest. Leg cramps are painful, and they may last for a few seconds to a few minutes. Cramps may return several times before they finally stop. Usually, leg cramps are not caused by a serious medical problem. In many cases, the cause is not known. Some common causes include:  Overexertion.  Overuse from repetitive motions, or doing the same thing over and over.  Remaining in a certain position for a long period of time.  Improper preparation, form, or technique while performing a sport or an activity.  Dehydration.  Injury.  Side effects of some medicines.  Abnormally low levels of the salts and ions in your blood (electrolytes), especially potassium and calcium. These levels could be low if you are taking water pills (diuretics) or if you are pregnant.  Follow these instructions at home: Watch your condition for any changes. Taking  the following actions may help to lessen any discomfort that you are feeling:  Stay well-hydrated. Drink enough fluid to keep your urine clear or pale yellow.  Try massaging, stretching, and relaxing the affected muscle. Do this for several minutes at a time.  For tight or tense muscles, use a warm towel, heating pad, or hot shower water directed to the affected area.  If you are sore or have pain after a cramp, applying ice to the affected area may relieve discomfort. ? Put ice in a plastic bag. ? Place a towel between your skin and the bag. ? Leave the ice on for 20 minutes, 2-3 times per day.  Avoid strenuous exercise for several days if you have been having frequent leg cramps.  Make sure that your diet includes the essential minerals for your muscles to work normally.  Take medicines only as directed by your health care provider.  Contact a health care provider if:  Your leg cramps get more severe or more frequent, or they do not improve over time.  Your foot becomes cold, numb, or blue. This information is not intended to replace advice given to you by your health care provider. Make sure you discuss any questions you have with your health care provider. Document Released: 11/14/2004 Document Revised: 03/14/2016 Document Reviewed: 09/14/2014 Elsevier Interactive Patient Education  2018 [employer]  

## 2017-05-01 NOTE — Progress Notes (Signed)
Barbara Dennis 83 y.o.   Chief Complaint  Patient presents with  . Urinary Tract Infection    pain x1 week  . Leg Pain    bilateral leg pain x1 year    HISTORY OF PRESENT ILLNESS: This is a 83 y.o. female complaining of bilateral leg pains on and off x 1 year; urinary symptoms x 1 week. Interpreter: [id number]  HPI   Prior to Admission medications   Medication Sig Start Date End Date Taking? Authorizing Provider  albuterol (PROVENTIL HFA;VENTOLIN HFA) 108 (90 Base) MCG/ACT inhaler Inhale 2 puffs into the lungs every 4 (four) hours as needed for wheezing or shortness of breath (cough, shortness of breath or wheezing.). 01/21/17  Yes English, Colletta [place] D, PA  traMADol (ULTRAM) 50 MG tablet Take 1 tablet (50 mg total) by mouth every 8 (eight) hours as needed. 02/17/17  Yes Ivar Drape D, PA  acetaminophen (TYLENOL ARTHRITIS PAIN) 650 MG CR tablet Take 1 tablet (650 mg total) by mouth every 12 (twelve) hours. For joint pain Patient not taking: Reported on 05/01/2017 02/22/16   Shawnee Knapp, MD  Calcium Carbonate-Vitamin D3 (CALCIUM 600/VITAMIN D) 600-400 MG-UNIT TABS Take 1 tablet by mouth 2 (two) times daily. Patient not taking: Reported on 05/01/2017 02/22/16   Shawnee Knapp, MD  diclofenac sodium (VOLTAREN) 1 % GEL Apply 4 g topically 4 (four) times daily. Patient not taking: Reported on 05/01/2017 02/22/16   Shawnee Knapp, MD  fexofenadine (ALLEGRA) 30 MG tablet Take 30 mg by mouth 2 (two) times daily.    [provider]  Glucos-Chond-Hyal Ac-Ca Fructo (MOVE FREE JOINT HEALTH ADVANCE) TABS Take 1 tablet by mouth 2 (two) times daily. Or as instructed by packaging Patient not taking: Reported on 03/10/2017 02/22/16   Shawnee Knapp, MD  lisinopril (PRINIVIL,ZESTRIL) 10 MG tablet Take 1 tablet (10 mg total) by mouth daily. Patient not taking: Reported on 05/01/2017 02/19/17   Ivar Drape D, PA  meloxicam (MOBIC) 7.5 MG tablet Take 1 tablet (7.5 mg total) by mouth daily. Patient not  taking: Reported on 05/01/2017 02/10/17   Ivar Drape D, PA  methocarbamol (ROBAXIN) 500 MG tablet TAKE 1 TABLET(500 MG) BY MOUTH AT BEDTIME AS NEEDED FOR MUSCLE SPASMS Patient not taking: Reported on 05/01/2017 04/18/17   Ivar Drape D, PA  omeprazole (PRILOSEC) 20 MG capsule Take 2 capsules (40 mg total) by mouth 2 (two) times daily. 30 minutes prior to breakfast Patient not taking: Reported on 05/01/2017 04/08/16   Shawnee Knapp, MD  polyethylene glycol powder (GLYCOLAX/MIRALAX) powder Take 17 g by mouth 2 (two) times daily as needed. Patient not taking: Reported on 05/01/2017 02/27/17   Ivar Drape D, PA  ranitidine (ZANTAC) 150 MG tablet Take 1 tablet (150 mg total) by mouth 2 (two) times daily as needed for heartburn (or indigestion). Patient not taking: Reported on 05/01/2017 02/22/16   Shawnee Knapp, MD  tiZANidine (ZANAFLEX) 2 MG tablet Take 1 tablet (2 mg total) by mouth at bedtime. Patient not taking: Reported on 05/01/2017 02/22/16   Shawnee Knapp, MD    No Known Allergies  Patient Active Problem List   Diagnosis Date Noted  . Sciatica of left side 03/10/2017  . Depression with anxiety 01/06/2015  . Cervical adenopathy 03/31/2014  . 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 HTN, goal below 150/90 06/17/2013  . DJD (degenerative joint disease) 02/16/2013  . Tobacco user 09/23/2012  . Menopause 08/21/2012  . Postmenopausal atrophic vaginitis  08/21/2012    Past Medical History:  Diagnosis Date  . Hypertension   . Mass of right side of neck     Past Surgical History:  Procedure Laterality Date  . APPENDECTOMY      Social History   Social History  . Marital status: Widowed    Spouse name: N/A  . Number of children: N/A  . Years of education: N/A   Occupational History  . Not on file.   Social History Main Topics  . Smoking status: Current Every Day Smoker    Packs/day: 0.70    Years: 60.00    Types: Cigarettes  . Smokeless tobacco: Never Used  .  Alcohol use No  . Drug use: No  . Sexual activity: Not on file   Other Topics Concern  . Not on file   Social History Narrative   Marital status: widowed 28 years ago.  From [place]; moved to [place] 1974.     Children: none       Lives:  Alone; brother in law is Psychologist, sport and exercise who is 51.      Employment: retired.       Tobacco:  1/2 ppd       Alcohol:  None      Exercise:  Walking daily.      ADLs: no driving.    No family history on file.   Review of Systems  Constitutional: Negative.  Negative for chills and fever.  HENT: Negative.   Eyes: Negative.   Respiratory: Negative.  Negative for cough, shortness of breath and wheezing.   Cardiovascular: Negative.  Negative for chest pain and leg swelling.  Gastrointestinal: Negative for abdominal pain, diarrhea, nausea and vomiting.  Genitourinary: Positive for frequency. Negative for dysuria, flank pain and hematuria.  Musculoskeletal: Positive for back pain, joint pain and neck pain.  Skin: Negative.  Negative for rash.  Neurological: Negative.  Negative for dizziness and headaches.  Endo/Heme/Allergies: Negative.   All other systems reviewed and are negative.  Vitals:   05/01/17 1409 05/01/17 1512  BP: (!) 155/70 138/60  Pulse: 89   Resp: 16   Temp: 99.8 F (37.7 C)      Physical Exam  Constitutional: She is oriented to person, place, and time. She appears well-developed and well-nourished.  HENT:  Head: Normocephalic and atraumatic.  Eyes: Pupils are equal, round, and reactive to light. Conjunctivae and EOM are normal.  Neck: Normal range of motion. Neck supple.  Cardiovascular: Normal rate, regular rhythm and normal heart sounds.   Pulmonary/Chest: Effort normal and breath sounds normal.  Abdominal: Soft. Bowel sounds are normal. She exhibits no distension. There is no tenderness.  Musculoskeletal: Normal range of motion.       Lumbar back: Normal. She exhibits normal range of motion, no tenderness, no bony tenderness, no  spasm and normal pulse.  LE: FROM, NVI, good peripheral circulation  Neurological: She is alert and oriented to person, place, and time. She displays normal reflexes. No sensory deficit. She exhibits normal muscle tone.  Skin: Skin is warm and dry. Capillary refill takes less than 2 seconds.  Psychiatric: She has a normal mood and affect.  Vitals reviewed.  Results for orders placed or performed in visit on 05/01/17 (from the past 24 hour(s))  POCT urinalysis dipstick     Status: Abnormal   Collection Time: 05/01/17  2:47 PM  Result Value Ref Range   Color, UA yellow    Clarity, UA clear    Glucose, UA  negative    Bilirubin, UA negative    Ketones, UA negative    Spec Grav, UA 1.020 1.010 - 1.025   Blood, UA trace-intact    pH, UA 7.5 5.0 - 8.0   Protein, UA negative    Urobilinogen, UA 0.2 0.2 or 1.0 E.U./dL   Nitrite, UA negative    Leukocytes, UA Trace (A) Negative  POCT UA - Microscopic Only     Status: None   Collection Time: 05/01/17  3:02 PM  Result Value Ref Range   WBC, Ur, HPF, POC few    RBC, urine, microscopic none    Bacteria, U Microscopic none    Mucus, UA absent    Epithelial cells, urine per micros few    Crystals, Ur, HPF, POC none    Casts, Ur, LPF, POC none    Yeast, UA none     ASSESSMENT & PLAN: Barbara Dennis was seen today for urinary tract infection and leg pain.  Diagnoses and all orders for this visit:  Bilateral leg pain  Lower urinary tract symptoms (LUTS) -     POCT urinalysis dipstick -     POCT UA - Microscopic Only  Musculoskeletal pain  Other orders -     traMADol (ULTRAM) 50 MG tablet; Take 1 tablet (50 mg total) by mouth every 8 (eight) hours as needed for severe pain.   Patient Instructions       IF you received an x-ray today, you will receive an invoice from [facility]. Please contact [facility] at [phone] with questions or concerns regarding your invoice.   IF you received labwork today, you will  receive an invoice from [facility]. Please contact [facility] at [phone] with questions or concerns regarding your invoice.   Our billing staff will not be able to assist you with questions regarding bills from these companies.  You will be contacted with the lab results as soon as they are available. The fastest way to get your results is to activate your My Chart account. Instructions are located on the last page of this paperwork. If you have not heard from [place] regarding the results in 2 weeks, please contact this office.     Leg Cramps Leg cramps occur when a muscle or muscles tighten and you have no control over this tightening (involuntary muscle contraction). Muscle cramps can develop in any muscle, but the most common place is in the calf muscles of the leg. Those cramps can occur during exercise or when you are at rest. Leg cramps are painful, and they may last for a few seconds to a few minutes. Cramps may return several times before they finally stop. Usually, leg cramps are not caused by a serious medical problem. In many cases, the cause is not known. Some common causes include:  Overexertion.  Overuse from repetitive motions, or doing the same thing over and over.  Remaining in a certain position for a long period of time.  Improper preparation, form, or technique while performing a sport or an activity.  Dehydration.  Injury.  Side effects of some medicines.  Abnormally low levels of the salts and ions in your blood (electrolytes), especially potassium and calcium. These levels could be low if you are taking water pills (diuretics) or if you are pregnant.  Follow these instructions at home: Watch your condition for any changes. Taking the following actions may help to lessen any discomfort that you are feeling:  Stay well-hydrated. Drink enough fluid to keep your urine  clear or pale yellow.  Try massaging, stretching, and relaxing the affected muscle. Do this for  several minutes at a time.  For tight or tense muscles, use a warm towel, heating pad, or hot shower water directed to the affected area.  If you are sore or have pain after a cramp, applying ice to the affected area may relieve discomfort. ? Put ice in a plastic bag. ? Place a towel between your skin and the bag. ? Leave the ice on for 20 minutes, 2-3 times per day.  Avoid strenuous exercise for several days if you have been having frequent leg cramps.  Make sure that your diet includes the essential minerals for your muscles to work normally.  Take medicines only as directed by your health care provider.  Contact a health care provider if:  Your leg cramps get more severe or more frequent, or they do not improve over time.  Your foot becomes cold, numb, or blue. This information is not intended to replace advice given to you by your health care provider. Make sure you discuss any questions you have with your health care provider. Document Released: 11/14/2004 Document Revised: 03/14/2016 Document Reviewed: 09/14/2014 Elsevier Interactive Patient Education  2018 Elsevier Inc.      Agustina Caroli, MD [facility]

## 2017-05-23 ENCOUNTER — Other Ambulatory Visit: Payer: Self-pay | Admitting: Physician Assistant

## 2017-05-23 DIAGNOSIS — I10 Essential (primary) hypertension: Secondary | ICD-10-CM

## 2017-05-26 NOTE — Telephone Encounter (Signed)
Letter mailed to patient about making an appointment for more medication refills.

## 2017-06-16 ENCOUNTER — Encounter: Payer: Self-pay | Admitting: Emergency Medicine

## 2017-06-16 ENCOUNTER — Ambulatory Visit (INDEPENDENT_AMBULATORY_CARE_PROVIDER_SITE_OTHER): Payer: Medicare Other

## 2017-06-16 ENCOUNTER — Ambulatory Visit (INDEPENDENT_AMBULATORY_CARE_PROVIDER_SITE_OTHER): Payer: Medicare Other | Admitting: Emergency Medicine

## 2017-06-16 VITALS — BP 156/75 | HR 93 | Temp 98.3°F | Resp 16 | Ht 58.25 in | Wt 100.6 lb

## 2017-06-16 DIAGNOSIS — R05 Cough: Secondary | ICD-10-CM

## 2017-06-16 DIAGNOSIS — R8290 Unspecified abnormal findings in urine: Secondary | ICD-10-CM | POA: Insufficient documentation

## 2017-06-16 DIAGNOSIS — R0789 Other chest pain: Secondary | ICD-10-CM | POA: Diagnosis not present

## 2017-06-16 DIAGNOSIS — R059 Cough, unspecified: Secondary | ICD-10-CM | POA: Insufficient documentation

## 2017-06-16 LAB — POCT URINALYSIS DIP (MANUAL ENTRY)
BILIRUBIN UA: NEGATIVE
BILIRUBIN UA: NEGATIVE mg/dL
GLUCOSE UA: NEGATIVE mg/dL
Nitrite, UA: NEGATIVE
Protein Ur, POC: NEGATIVE mg/dL
SPEC GRAV UA: 1.015 (ref 1.010–1.025)
UROBILINOGEN UA: 0.2 U/dL
pH, UA: 7 (ref 5.0–8.0)

## 2017-06-16 MED ORDER — AMOXICILLIN-POT CLAVULANATE 875-125 MG PO TABS: 1.0000 | ORAL_TABLET | Freq: Two times a day (BID) | ORAL | 0 refills | Status: DC

## 2017-06-16 MED ORDER — AMOXICILLIN-POT CLAVULANATE 875-125 MG PO TABS: 1.0000 | ORAL_TABLET | Freq: Two times a day (BID) | ORAL | 0 refills | Status: AC

## 2017-06-16 NOTE — Patient Instructions (Addendum)
   IF you received an x-ray today, you will receive an invoice from [facility]. Please contact [facility] at [phone] with questions or concerns regarding your invoice.   IF you received labwork today, you will receive an invoice from [facility]. Please contact [facility] at [phone] with questions or concerns regarding your invoice.   Our billing staff will not be able to assist you with questions regarding bills from these companies.  You will be contacted with the lab results as soon as they are available. The fastest way to get your results is to activate your My Chart account. Instructions are located on the last page of this paperwork. If you have not heard from us regarding the results in 2 weeks, please contact this office.     Nonspecific Chest Pain Chest pain can be caused by many different conditions. There is a chance that your pain could be related to something serious, such as a heart attack or a blood clot in your lungs. Chest pain can also be caused by conditions that are not life-threatening. If you have chest pain, it is very important to follow up with your doctor. Follow these instructions at home: Medicines  If you were prescribed an antibiotic medicine, take it as told by your doctor. Do not stop taking the antibiotic even if you start to feel better.  Take over-the-counter and prescription medicines only as told by your doctor. Lifestyle  Do not use any products that contain nicotine or tobacco, such as cigarettes and e-cigarettes. If you need help quitting, ask your doctor.  Do not drink alcohol.  Make lifestyle changes as told by your doctor. These may include: ? Getting regular exercise. Ask your doctor for some activities that are safe for you. ? Eating a heart-healthy diet. A diet specialist (dietitian) can help you to learn healthy eating options. ? Staying at a healthy weight. ? Managing diabetes, if needed. ? Lowering your  stress, as with deep breathing or spending time in nature. General instructions  Avoid any activities that make you feel chest pain.  If your chest pain is because of heartburn: ? Raise (elevate) the head of your bed about 6 inches (15 cm). You can do this by putting blocks under the bed legs at the head of the bed. ? Do not sleep with extra pillows under your head. That does not help heartburn.  Keep all follow-up visits as told by your doctor. This is important. This includes any further testing if your chest pain does not go away. Contact a doctor if:  Your chest pain does not go away.  You have a rash with blisters on your chest.  You have a fever.  You have chills. Get help right away if:  Your chest pain is worse.  You have a cough that gets worse, or you cough up blood.  You have very bad (severe) pain in your belly (abdomen).  You are very weak.  You pass out (faint).  You have either of these for no clear reason: ? Sudden chest discomfort. ? Sudden discomfort in your arms, back, neck, or jaw.  You have shortness of breath at any time.  You suddenly start to sweat, or your skin gets clammy.  You feel sick to your stomach (nauseous).  You throw up (vomit).  You suddenly feel light-headed or dizzy.  Your heart starts to beat fast, or it feels like it is skipping beats. These symptoms may be an emergency. Do not wait   to see if the symptoms will go away. Get medical help right away. Call your local emergency services (911 in the [place] Do not drive yourself to the hospital. This information is not intended to replace advice given to you by your health care provider. Make sure you discuss any questions you have with your health care provider. Document Released: 03/25/2008 Document Revised: 07/01/2016 Document Reviewed: 07/01/2016 Elsevier Interactive Patient Education  2017 Elsevier Inc.  Cough, Adult A cough helps to clear your throat and lungs. A cough may  last only 2-3 weeks (acute), or it may last longer than 8 weeks (chronic). Many different things can cause a cough. A cough may be a sign of an illness or another medical condition. Follow these instructions at home:  Pay attention to any changes in your cough.  Take medicines only as told by your doctor. ? If you were prescribed an antibiotic medicine, take it as told by your doctor. Do not stop taking it even if you start to feel better. ? Talk with your doctor before you try using a cough medicine.  Drink enough fluid to keep your pee (urine) clear or pale yellow.  If the air is dry, use a cold steam vaporizer or humidifier in your home.  Stay away from things that make you cough at work or at home.  If your cough is worse at night, try using extra pillows to raise your head up higher while you sleep.  Do not smoke, and try not to be around smoke. If you need help quitting, ask your doctor.  Do not have caffeine.  Do not drink alcohol.  Rest as needed. Contact a doctor if:  You have new problems (symptoms).  You cough up yellow fluid (pus).  Your cough does not get better after 2-3 weeks, or your cough gets worse.  Medicine does not help your cough and you are not sleeping well.  You have pain that gets worse or pain that is not helped with medicine.  You have a fever.  You are losing weight and you do not know why.  You have night sweats. Get help right away if:  You cough up blood.  You have trouble breathing.  Your heartbeat is very fast. This information is not intended to replace advice given to you by your health care provider. Make sure you discuss any questions you have with your health care provider. Document Released: 06/20/2011 Document Revised: 03/14/2016 Document Reviewed: 12/14/2014 Elsevier Interactive Patient Education  2018 [employer].  Urinary Tract Infection, Adult A urinary tract infection (UTI) is an infection of any part of the urinary  tract. The urinary tract includes the:  Kidneys.  Ureters.  Bladder.  Urethra.  These organs make, store, and get rid of pee (urine) in the body. Follow these instructions at home:  Take over-the-counter and prescription medicines only as told by your doctor.  If you were prescribed an antibiotic medicine, take it as told by your doctor. Do not stop taking the antibiotic even if you start to feel better.  Avoid the following drinks: ? Alcohol. ? Caffeine. ? Tea. ? Carbonated drinks.  Drink enough fluid to keep your pee clear or pale yellow.  Keep all follow-up visits as told by your doctor. This is important.  Make sure to: ? Empty your bladder often and completely. Do not to hold pee for long periods of time. ? Empty your bladder before and after sex. ? Wipe from front to back after  a bowel movement if you are female. Use each tissue one time when you wipe. Contact a doctor if:  You have back pain.  You have a fever.  You feel sick to your stomach (nauseous).  You throw up (vomit).  Your symptoms do not get better after 3 days.  Your symptoms go away and then come back. Get help right away if:  You have very bad back pain.  You have very bad lower belly (abdominal) pain.  You are throwing up and cannot keep down any medicines or water. This information is not intended to replace advice given to you by your health care provider. Make sure you discuss any questions you have with your health care provider. Document Released: 03/25/2008 Document Revised: 03/14/2016 Document Reviewed: 08/28/2015 Elsevier Interactive Patient Education  [employer].

## 2017-06-16 NOTE — Progress Notes (Signed)
Barbara Dennis 83 y.o.   Chief Complaint  Patient presents with  . Chest Pain  . Urinary Frequency    with odor x 10 days    HISTORY OF PRESENT ILLNESS: This is a 83 y.o. female complaining of urinary frequency and bad odor x 10 days; also c/o chest pain when she coughs. [place] [id number] interpreter.  HPI   Prior to Admission medications   Medication Sig Start Date End Date Taking? Authorizing Provider  acetaminophen (TYLENOL ARTHRITIS PAIN) 650 MG CR tablet Take 1 tablet (650 mg total) by mouth every 12 (twelve) hours. For joint pain 02/22/16  Yes Shawnee Knapp, MD  albuterol (PROVENTIL HFA;VENTOLIN HFA) 108 (90 Base) MCG/ACT inhaler Inhale 2 puffs into the lungs every 4 (four) hours as needed for wheezing or shortness of breath (cough, shortness of breath or wheezing.). 01/21/17  Yes English, Colletta [place] D, PA  Calcium Carbonate-Vitamin D3 (CALCIUM 600/VITAMIN D) 600-400 MG-UNIT TABS Take 1 tablet by mouth 2 (two) times daily. 02/22/16  Yes Shawnee Knapp, MD  lisinopril (PRINIVIL,ZESTRIL) 10 MG tablet TAKE 1 TABLET(10 MG) BY MOUTH DAILY 05/23/17  Yes English, Stephanie D, PA  omeprazole (PRILOSEC) 20 MG capsule Take 2 capsules (40 mg total) by mouth 2 (two) times daily. 30 minutes prior to breakfast 04/08/16  Yes Shawnee Knapp, MD  ranitidine (ZANTAC) 150 MG tablet Take 1 tablet (150 mg total) by mouth 2 (two) times daily as needed for heartburn (or indigestion). 02/22/16  Yes Shawnee Knapp, MD  diclofenac sodium (VOLTAREN) 1 % GEL Apply 4 g topically 4 (four) times daily. Patient not taking: Reported on 05/01/2017 02/22/16   Shawnee Knapp, MD  fexofenadine (ALLEGRA) 30 MG tablet Take 30 mg by mouth 2 (two) times daily.    [provider]  Glucos-Chond-Hyal Ac-Ca Fructo (MOVE FREE JOINT HEALTH ADVANCE) TABS Take 1 tablet by mouth 2 (two) times daily. Or as instructed by packaging Patient not taking: Reported on 03/10/2017 02/22/16   Shawnee Knapp, MD  meloxicam (MOBIC) 7.5 MG tablet Take 1 tablet (7.5 mg  total) by mouth daily. Patient not taking: Reported on 05/01/2017 02/10/17   Ivar Drape D, PA  methocarbamol (ROBAXIN) 500 MG tablet TAKE 1 TABLET(500 MG) BY MOUTH AT BEDTIME AS NEEDED FOR MUSCLE SPASMS Patient not taking: Reported on 05/01/2017 04/18/17   Ivar Drape D, PA  polyethylene glycol powder (GLYCOLAX/MIRALAX) powder Take 17 g by mouth 2 (two) times daily as needed. Patient not taking: Reported on 05/01/2017 02/27/17   Ivar Drape D, PA  tiZANidine (ZANAFLEX) 2 MG tablet Take 1 tablet (2 mg total) by mouth at bedtime. Patient not taking: Reported on 05/01/2017 02/22/16   Shawnee Knapp, MD  traMADol (ULTRAM) 50 MG tablet Take 1 tablet (50 mg total) by mouth every 8 (eight) hours as needed for severe pain. Patient not taking: Reported on 06/16/2017 05/01/17   Horald Pollen, MD    Not on File  Patient Active Problem List   Diagnosis Date Noted  . Sciatica of left side 03/10/2017  . Depression with anxiety 01/06/2015  . Cervical adenopathy 03/31/2014  . 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 HTN, goal below 150/90 06/17/2013  . DJD (degenerative joint disease) 02/16/2013  . Tobacco user 09/23/2012  . Menopause 08/21/2012  . Postmenopausal atrophic vaginitis 08/21/2012    Past Medical History:  Diagnosis Date  . Hypertension   . Mass of right side of neck     Past Surgical History:  Procedure Laterality Date  . APPENDECTOMY      Social History   Social History  . Marital status: Widowed    Spouse name: N/A  . Number of children: N/A  . Years of education: N/A   Occupational History  . Not on file.   Social History Main Topics  . Smoking status: Current Every Day Smoker    Packs/day: 0.70    Years: 60.00    Types: Cigarettes  . Smokeless tobacco: Never Used  . Alcohol use No  . Drug use: No  . Sexual activity: Not on file   Other Topics Concern  . Not on file   Social History Narrative   Marital status: widowed 28 years ago.   From [place]; moved to [place] 1974.     Children: none       Lives:  Alone; brother in law is Psychologist, sport and exercise who is 23.      Employment: retired.       Tobacco:  1/2 ppd       Alcohol:  None      Exercise:  Walking daily.      ADLs: no driving.    No family history on file.   Review of Systems  Constitutional: Negative.  Negative for chills and fever.  HENT: Negative.  Negative for congestion and sore throat.   Eyes: Negative for blurred vision and double vision.  Respiratory: Positive for cough. Negative for sputum production and shortness of breath.   Cardiovascular: Positive for chest pain. Negative for palpitations and leg swelling.  Gastrointestinal: Negative for abdominal pain, diarrhea, nausea and vomiting.  Genitourinary: Positive for frequency and urgency. Negative for flank pain and hematuria.  Musculoskeletal: Negative for back pain, myalgias and neck pain.  Skin: Negative.  Negative for rash.  Neurological: Negative.  Negative for dizziness and headaches.  Endo/Heme/Allergies: Negative.   All other systems reviewed and are negative.   EKG: NSR, no acute ischemic changes, no changes c/w 04/02/16. Dg Chest 2 View  Result Date: 06/16/2017 CLINICAL DATA:  Cough, history hypertension and smoking EXAM: CHEST  2 VIEW COMPARISON:  01/21/2017 FINDINGS: Minimal enlargement of cardiac silhouette. Calcified tortuous thoracic aorta. Mediastinal contours and pulmonary vascularity otherwise normal. Emphysematous and bronchitic changes consistent with COPD. Chronic interstitial prominence in the mid to lower lungs, unchanged. No definite acute infiltrate, pleural effusion or pneumothorax. Probable calcified granuloma LEFT lower lobe with calcified lymph nodes at LEFT hilum. Diffuse osseous demineralization. IMPRESSION: COPD changes with chronic interstitial prominence and old granulomatous disease. No acute abnormalities. Electronically Signed   By: Lavonia Dana M.D.   On: 06/16/2017 15:25   Vitals:     06/16/17 1501  BP: (!) 156/75  Pulse: 93  Resp: 16  Temp: 98.3 F (36.8 C)  SpO2: 95%    Physical Exam  Constitutional: She is oriented to person, place, and time. She appears well-developed and well-nourished.  HENT:  Head: Normocephalic and atraumatic.  Right Ear: Tympanic membrane, external ear and ear canal normal.  Left Ear: Tympanic membrane, external ear and ear canal normal.  Nose: Nose normal.  Mouth/Throat: Oropharynx is clear and moist.  Eyes: Pupils are equal, round, and reactive to light. Conjunctivae and EOM are normal.  Neck: Normal range of motion. Neck supple. No JVD present.  Cardiovascular: Normal rate, regular rhythm, normal heart sounds and intact distal pulses.   Pulmonary/Chest: Effort normal and breath sounds normal.  Abdominal: Soft. Bowel sounds are normal. She exhibits no distension. There is no tenderness.  Musculoskeletal: Normal range of motion.  Lymphadenopathy:    She has no cervical adenopathy.  Neurological: She is alert and oriented to person, place, and time. No sensory deficit. She exhibits normal muscle tone.  Skin: Skin is warm and dry. Capillary refill takes less than 2 seconds. No rash noted.  Psychiatric: She has a normal mood and affect. Her behavior is normal.  Vitals reviewed.    ASSESSMENT & PLAN: Barbara Dennis was seen today for chest pain and urinary frequency.  Diagnoses and all orders for this visit:  Cough -     DG Chest 2 View; Future -     CBC with Differential/Platelet -     Comprehensive metabolic panel  Bad odor of urine -     POCT urinalysis dipstick -     Urine Culture  Atypical chest pain -     EKG 12-Lead  Other orders -     Discontinue: amoxicillin-clavulanate (AUGMENTIN) 875-125 MG tablet; Take 1 tablet by mouth 2 (two) times daily. -     amoxicillin-clavulanate (AUGMENTIN) 875-125 MG tablet; Take 1 tablet by mouth 2 (two) times daily.    Patient Instructions       IF you received an x-ray today, you  will receive an invoice from [facility]. Please contact [facility] at [phone] with questions or concerns regarding your invoice.   IF you received labwork today, you will receive an invoice from [facility]. Please contact [facility] at [phone] with questions or concerns regarding your invoice.   Our billing staff will not be able to assist you with questions regarding bills from these companies.  You will be contacted with the lab results as soon as they are available. The fastest way to get your results is to activate your My Chart account. Instructions are located on the last page of this paperwork. If you have not heard from [place] regarding the results in 2 weeks, please contact this office.     Nonspecific Chest Pain Chest pain can be caused by many different conditions. There is a chance that your pain could be related to something serious, such as a heart attack or a blood clot in your lungs. Chest pain can also be caused by conditions that are not life-threatening. If you have chest pain, it is very important to follow up with your doctor. Follow these instructions at home: Medicines  If you were prescribed an antibiotic medicine, take it as told by your doctor. Do not stop taking the antibiotic even if you start to feel better.  Take over-the-counter and prescription medicines only as told by your doctor. Lifestyle  Do not use any products that contain nicotine or tobacco, such as cigarettes and e-cigarettes. If you need help quitting, ask your doctor.  Do not drink alcohol.  Make lifestyle changes as told by your doctor. These may include: ? Getting regular exercise. Ask your doctor for some activities that are safe for you. ? Eating a heart-healthy diet. A diet specialist (dietitian) can help you to learn healthy eating options. ? Staying at a healthy weight. ? Managing diabetes, if needed. ? Lowering your stress, as with deep breathing or spending time  in nature. General instructions  Avoid any activities that make you feel chest pain.  If your chest pain is because of heartburn: ? Raise (elevate) the head of your bed about 6 inches (15 cm). You can do this by putting blocks under the bed legs at the head of the bed. ? Do not  sleep with extra pillows under your head. That does not help heartburn.  Keep all follow-up visits as told by your doctor. This is important. This includes any further testing if your chest pain does not go away. Contact a doctor if:  Your chest pain does not go away.  You have a rash with blisters on your chest.  You have a fever.  You have chills. Get help right away if:  Your chest pain is worse.  You have a cough that gets worse, or you cough up blood.  You have very bad (severe) pain in your belly (abdomen).  You are very weak.  You pass out (faint).  You have either of these for no clear reason: ? Sudden chest discomfort. ? Sudden discomfort in your arms, back, neck, or jaw.  You have shortness of breath at any time.  You suddenly start to sweat, or your skin gets clammy.  You feel sick to your stomach (nauseous).  You throw up (vomit).  You suddenly feel light-headed or dizzy.  Your heart starts to beat fast, or it feels like it is skipping beats. These symptoms may be an emergency. Do not wait to see if the symptoms will go away. Get medical help right away. Call your local emergency services (911 in the [place] Do not drive yourself to the hospital. This information is not intended to replace advice given to you by your health care provider. Make sure you discuss any questions you have with your health care provider. Document Released: 03/25/2008 Document Revised: 07/01/2016 Document Reviewed: 07/01/2016 Elsevier Interactive Patient Education  2017 [employer] Inc.  Cough, Adult A cough helps to clear your throat and lungs. A cough may last only 2-3 weeks (acute), or it may last longer  than 8 weeks (chronic). Many different things can cause a cough. A cough may be a sign of an illness or another medical condition. Follow these instructions at home:  Pay attention to any changes in your cough.  Take medicines only as told by your doctor. ? If you were prescribed an antibiotic medicine, take it as told by your doctor. Do not stop taking it even if you start to feel better. ? Talk with your doctor before you try using a cough medicine.  Drink enough fluid to keep your pee (urine) clear or pale yellow.  If the air is dry, use a cold steam vaporizer or humidifier in your home.  Stay away from things that make you cough at work or at home.  If your cough is worse at night, try using extra pillows to raise your head up higher while you sleep.  Do not smoke, and try not to be around smoke. If you need help quitting, ask your doctor.  Do not have caffeine.  Do not drink alcohol.  Rest as needed. Contact a doctor if:  You have new problems (symptoms).  You cough up yellow fluid (pus).  Your cough does not get better after 2-3 weeks, or your cough gets worse.  Medicine does not help your cough and you are not sleeping well.  You have pain that gets worse or pain that is not helped with medicine.  You have a fever.  You are losing weight and you do not know why.  You have night sweats. Get help right away if:  You cough up blood.  You have trouble breathing.  Your heartbeat is very fast. This information is not intended to replace advice given to you by  your health care provider. Make sure you discuss any questions you have with your health care provider. Document Released: 06/20/2011 Document Revised: 03/14/2016 Document Reviewed: 12/14/2014 Elsevier Interactive Patient Education  2018 [employer].  Urinary Tract Infection, Adult A urinary tract infection (UTI) is an infection of any part of the urinary tract. The urinary tract includes  the:  Kidneys.  Ureters.  Bladder.  Urethra.  These organs make, store, and get rid of pee (urine) in the body. Follow these instructions at home:  Take over-the-counter and prescription medicines only as told by your doctor.  If you were prescribed an antibiotic medicine, take it as told by your doctor. Do not stop taking the antibiotic even if you start to feel better.  Avoid the following drinks: ? Alcohol. ? Caffeine. ? Tea. ? Carbonated drinks.  Drink enough fluid to keep your pee clear or pale yellow.  Keep all follow-up visits as told by your doctor. This is important.  Make sure to: ? Empty your bladder often and completely. Do not to hold pee for long periods of time. ? Empty your bladder before and after sex. ? Wipe from front to back after a bowel movement if you are female. Use each tissue one time when you wipe. Contact a doctor if:  You have back pain.  You have a fever.  You feel sick to your stomach (nauseous).  You throw up (vomit).  Your symptoms do not get better after 3 days.  Your symptoms go away and then come back. Get help right away if:  You have very bad back pain.  You have very bad lower belly (abdominal) pain.  You are throwing up and cannot keep down any medicines or water. This information is not intended to replace advice given to you by your health care provider. Make sure you discuss any questions you have with your health care provider. Document Released: 03/25/2008 Document Revised: 03/14/2016 Document Reviewed: 08/28/2015 Elsevier Interactive Patient Education  2018 Elsevier Inc.      Agustina Caroli, MD [facility]

## 2017-06-17 LAB — CBC WITH DIFFERENTIAL/PLATELET
BASOS ABS: 0 10*3/uL (ref 0.0–0.2)
BASOS: 0 %
EOS (ABSOLUTE): 0.2 10*3/uL (ref 0.0–0.4)
Eos: 2 %
Hematocrit: 34.1 % (ref 34.0–46.6)
Hemoglobin: 11.9 g/dL (ref 11.1–15.9)
IMMATURE GRANS (ABS): 0 10*3/uL (ref 0.0–0.1)
Immature Granulocytes: 0 %
LYMPHS ABS: 2.3 10*3/uL (ref 0.7–3.1)
Lymphs: 29 %
MCH: 31.6 pg (ref 26.6–33.0)
MCHC: 34.9 g/dL (ref 31.5–35.7)
MCV: 91 fL (ref 79–97)
MONOS ABS: 0.7 10*3/uL (ref 0.1–0.9)
Monocytes: 9 %
NEUTROS ABS: 4.5 10*3/uL (ref 1.4–7.0)
Neutrophils: 60 %
PLATELETS: 287 10*3/uL (ref 150–379)
RBC: 3.76 x10E6/uL — ABNORMAL LOW (ref 3.77–5.28)
RDW: 15 % (ref 12.3–15.4)
WBC: 7.7 10*3/uL (ref 3.4–10.8)

## 2017-06-17 LAB — URINE CULTURE

## 2017-06-17 LAB — COMPREHENSIVE METABOLIC PANEL
A/G RATIO: 1.2 (ref 1.2–2.2)
ALK PHOS: 74 IU/L (ref 39–117)
ALT: 14 IU/L (ref 0–32)
AST: 19 IU/L (ref 0–40)
Albumin: 4.4 g/dL (ref 3.5–4.7)
BILIRUBIN TOTAL: 0.2 mg/dL (ref 0.0–1.2)
BUN/Creatinine Ratio: 15 (ref 12–28)
BUN: 15 mg/dL (ref 8–27)
CHLORIDE: 96 mmol/L (ref 96–106)
CO2: 23 mmol/L (ref 20–29)
Calcium: 10.6 mg/dL — ABNORMAL HIGH (ref 8.7–10.3)
Creatinine, Ser: 0.97 mg/dL (ref 0.57–1.00)
GFR calc non Af Amer: 55 mL/min/{1.73_m2} — ABNORMAL LOW (ref >59)
GFR, EST AFRICAN AMERICAN: 63 mL/min/{1.73_m2} (ref >59)
GLUCOSE: 113 mg/dL — AB (ref 65–99)
Globulin, Total: 3.8 g/dL (ref 1.5–4.5)
POTASSIUM: 4.3 mmol/L (ref 3.5–5.2)
Sodium: 135 mmol/L (ref 134–144)
TOTAL PROTEIN: 8.2 g/dL (ref 6.0–8.5)

## 2017-06-18 ENCOUNTER — Other Ambulatory Visit: Payer: Self-pay | Admitting: Emergency Medicine

## 2017-07-15 ENCOUNTER — Encounter: Payer: Self-pay | Admitting: Emergency Medicine

## 2017-07-15 ENCOUNTER — Other Ambulatory Visit: Payer: Self-pay

## 2017-07-15 ENCOUNTER — Ambulatory Visit (INDEPENDENT_AMBULATORY_CARE_PROVIDER_SITE_OTHER): Payer: Medicare Other

## 2017-07-15 ENCOUNTER — Ambulatory Visit (INDEPENDENT_AMBULATORY_CARE_PROVIDER_SITE_OTHER): Payer: Medicare Other | Admitting: Emergency Medicine

## 2017-07-15 VITALS — BP 140/60 | HR 81 | Temp 98.1°F | Resp 16 | Ht 58.5 in | Wt 100.6 lb

## 2017-07-15 DIAGNOSIS — M79605 Pain in left leg: Secondary | ICD-10-CM

## 2017-07-15 DIAGNOSIS — M79604 Pain in right leg: Secondary | ICD-10-CM | POA: Diagnosis not present

## 2017-07-15 DIAGNOSIS — M8589 Other specified disorders of bone density and structure, multiple sites: Secondary | ICD-10-CM

## 2017-07-15 DIAGNOSIS — M545 Low back pain, unspecified: Secondary | ICD-10-CM

## 2017-07-15 DIAGNOSIS — M791 Myalgia: Secondary | ICD-10-CM | POA: Diagnosis not present

## 2017-07-15 DIAGNOSIS — G8929 Other chronic pain: Secondary | ICD-10-CM | POA: Diagnosis not present

## 2017-07-15 DIAGNOSIS — M7918 Myalgia, other site: Secondary | ICD-10-CM

## 2017-07-15 DIAGNOSIS — I10 Essential (primary) hypertension: Secondary | ICD-10-CM | POA: Insufficient documentation

## 2017-07-15 DIAGNOSIS — M79672 Pain in left foot: Secondary | ICD-10-CM | POA: Insufficient documentation

## 2017-07-15 DIAGNOSIS — M79671 Pain in right foot: Secondary | ICD-10-CM | POA: Insufficient documentation

## 2017-07-15 MED ORDER — TRAMADOL HCL 50 MG PO TABS
50.0000 mg | ORAL_TABLET | Freq: Three times a day (TID) | ORAL | 0 refills | Status: DC | PRN
Start: 2017-07-15 — End: 2017-08-20

## 2017-07-15 MED ORDER — LISINOPRIL 10 MG PO TABS: ORAL_TABLET | ORAL | 3 refills | Status: DC

## 2017-07-15 NOTE — Progress Notes (Signed)
Barbara Dennis 83 y.o.   Chief Complaint  Patient presents with  . Referral     to Endocrinology consult- elevated glucose and calcium  . Medication Refill    lisinopril and tramadol    HISTORY OF PRESENT ILLNESS: This is a 83 y.o. female complaining of pain to low back, hips, and legs of chronic nature; last visit blood work showed slight hypercalcemia and mild hyperglycemia. Needs medication refill also.  HPI   Prior to Admission medications   Medication Sig Start Date End Date Taking? Authorizing Provider  acetaminophen (TYLENOL ARTHRITIS PAIN) 650 MG CR tablet Take 1 tablet (650 mg total) by mouth every 12 (twelve) hours. For joint pain 02/22/16  Yes Shawnee Knapp, MD  albuterol (PROVENTIL HFA;VENTOLIN HFA) 108 (90 Base) MCG/ACT inhaler Inhale 2 puffs into the lungs every 4 (four) hours as needed for wheezing or shortness of breath (cough, shortness of breath or wheezing.). 01/21/17  Yes English, Colletta [place] D, PA  Calcium Carbonate-Vitamin D3 (CALCIUM 600/VITAMIN D) 600-400 MG-UNIT TABS Take 1 tablet by mouth 2 (two) times daily. 02/22/16  Yes Shawnee Knapp, MD  lisinopril (PRINIVIL,ZESTRIL) 10 MG tablet TAKE 1 TABLET(10 MG) BY MOUTH DAILY 05/23/17  Yes English, Stephanie D, PA  traMADol (ULTRAM) 50 MG tablet Take 1 tablet (50 mg total) by mouth every 8 (eight) hours as needed for severe pain. 05/01/17  Yes Julee Stoll, Ines Bloomer, MD  diclofenac sodium (VOLTAREN) 1 % GEL Apply 4 g topically 4 (four) times daily. Patient not taking: Reported on 05/01/2017 02/22/16   Shawnee Knapp, MD  fexofenadine (ALLEGRA) 30 MG tablet Take 30 mg by mouth 2 (two) times daily.    [provider]  Glucos-Chond-Hyal Ac-Ca Fructo (MOVE FREE JOINT HEALTH ADVANCE) TABS Take 1 tablet by mouth 2 (two) times daily. Or as instructed by packaging Patient not taking: Reported on 03/10/2017 02/22/16   Shawnee Knapp, MD  meloxicam (MOBIC) 7.5 MG tablet Take 1 tablet (7.5 mg total) by mouth daily. Patient not taking: Reported  on 05/01/2017 02/10/17   Ivar Drape D, PA  methocarbamol (ROBAXIN) 500 MG tablet TAKE 1 TABLET(500 MG) BY MOUTH AT BEDTIME AS NEEDED FOR MUSCLE SPASMS Patient not taking: Reported on 05/01/2017 04/18/17   Ivar Drape D, PA  omeprazole (PRILOSEC) 20 MG capsule Take 2 capsules (40 mg total) by mouth 2 (two) times daily. 30 minutes prior to breakfast Patient not taking: Reported on 07/15/2017 04/08/16   Shawnee Knapp, MD  polyethylene glycol powder (GLYCOLAX/MIRALAX) powder Take 17 g by mouth 2 (two) times daily as needed. Patient not taking: Reported on 05/01/2017 02/27/17   Ivar Drape D, PA  ranitidine (ZANTAC) 150 MG tablet Take 1 tablet (150 mg total) by mouth 2 (two) times daily as needed for heartburn (or indigestion). Patient not taking: Reported on 07/15/2017 02/22/16   Shawnee Knapp, MD  tiZANidine (ZANAFLEX) 2 MG tablet Take 1 tablet (2 mg total) by mouth at bedtime. Patient not taking: Reported on 05/01/2017 02/22/16   Shawnee Knapp, MD    Not on File  Patient Active Problem List   Diagnosis Date Noted  . Cough 06/16/2017  . Bad odor of urine 06/16/2017  . Atypical chest pain 06/16/2017  . Sciatica of left side 03/10/2017  . Depression with anxiety 01/06/2015  . Cervical adenopathy 03/31/2014  . 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 HTN, goal below 150/90 06/17/2013  . DJD (degenerative joint disease) 02/16/2013  . Tobacco user 09/23/2012  .  Menopause 08/21/2012  . Postmenopausal atrophic vaginitis 08/21/2012    Past Medical History:  Diagnosis Date  . Hypertension   . Mass of right side of neck     Past Surgical History:  Procedure Laterality Date  . APPENDECTOMY      Social History   Social History  . Marital status: Widowed    Spouse name: N/A  . Number of children: N/A  . Years of education: N/A   Occupational History  . Not on file.   Social History Main Topics  . Smoking status: Current Every Day Smoker    Packs/day: 0.70    Years: 60.00      Types: Cigarettes  . Smokeless tobacco: Never Used  . Alcohol use No  . Drug use: No  . Sexual activity: Not on file   Other Topics Concern  . Not on file   Social History Narrative   Marital status: widowed 28 years ago.  From [place]; moved to [place] 1974.     Children: none       Lives:  Alone; brother in law is Psychologist, sport and exercise who is 40.      Employment: retired.       Tobacco:  1/2 ppd       Alcohol:  None      Exercise:  Walking daily.      ADLs: no driving.    No family history on file.   Review of Systems  Constitutional: Negative.  Negative for chills, fever and weight loss.  HENT: Negative.  Negative for congestion, nosebleeds and sore throat.   Eyes: Negative.  Negative for discharge and redness.  Respiratory: Negative for cough and shortness of breath.   Cardiovascular: Negative.  Negative for chest pain and palpitations.  Gastrointestinal: Negative for abdominal pain, blood in stool, diarrhea, nausea and vomiting.  Musculoskeletal: Positive for back pain and joint pain.  Skin: Negative.  Negative for rash.  Neurological: Negative.  Negative for dizziness, sensory change, focal weakness and headaches.  Endo/Heme/Allergies: Negative.   All other systems reviewed and are negative.     Vitals:   07/15/17 1404  BP: 140/60  Pulse: 81  Resp: 16  Temp: 98.1 F (36.7 C)  SpO2: 98%    Physical Exam  Constitutional: She is oriented to person, place, and time. She appears well-developed and well-nourished.  HENT:  Head: Normocephalic and atraumatic.  Eyes: Pupils are equal, round, and reactive to light. Conjunctivae and EOM are normal.  Neck: Normal range of motion. Neck supple. No JVD present. No thyromegaly present.  Cardiovascular: Normal rate, regular rhythm and normal heart sounds.   Pulmonary/Chest: Effort normal and breath sounds normal.  Abdominal: Soft. Bowel sounds are normal. She exhibits no distension. There is no tenderness.  Musculoskeletal: Normal range  of motion.       Lumbar back: Normal. She exhibits normal range of motion, no tenderness and no bony tenderness.  No visible joint abnormalities and no erythema; FROM at all joints. LE: NVI with FROM  Lymphadenopathy:    She has no cervical adenopathy.  Neurological: She is alert and oriented to person, place, and time. No sensory deficit. She exhibits normal muscle tone.  Skin: Skin is warm and dry. Capillary refill takes less than 2 seconds. No rash noted.  Psychiatric: She has a normal mood and affect. Her behavior is normal.  Vitals reviewed.  Chronic bilateral low back pain without sciatica Chronic pains have a bone component with osteopenia a major contributor; pt needs  to be assesed for osteoporosis in view of this decrease in bone mass. DXA scan requested. Will treat pain with Tramadol as needed. Endocrinology evaluation requested last time for evaluation of hypercalcemia (10.6).  Dg Lumbar Spine Complete  Result Date: 07/15/2017 CLINICAL DATA:  Chronic low back pain EXAM: LUMBAR SPINE - COMPLETE 4+ VIEW COMPARISON:  None. FINDINGS: Diffuse osteopenia. Degenerative disc disease, most pronounced at L2-3. Mild diffuse degenerative facet disease. No visible fracture. Aortic calcifications. No aneurysm. IMPRESSION: Severe diffuse osteopenia. Degenerative disc and facet disease. No acute bony abnormality. Electronically Signed   By: Rolm Baptise M.D.   On: 07/15/2017 15:15     ASSESSMENT & PLAN:  [place] was seen today for referral and medication refill.  Diagnoses and all orders for this visit:  Chronic bilateral low back pain without sciatica -     DG Lumbar Spine Complete; Future -     traMADol (ULTRAM) 50 MG tablet; Take 1 tablet (50 mg total) by mouth every 8 (eight) hours as needed for severe pain.  Musculoskeletal pain -     traMADol (ULTRAM) 50 MG tablet; Take 1 tablet (50 mg total) by mouth every 8 (eight) hours as needed for severe pain.  Bilateral leg pain -     traMADol  (ULTRAM) 50 MG tablet; Take 1 tablet (50 mg total) by mouth every 8 (eight) hours as needed for severe pain.  Osteopenia of multiple sites -     DG DXA BODY COMPOSITION; Future  Essential hypertension, benign -     Discontinue: lisinopril (PRINIVIL,ZESTRIL) 10 MG tablet; TAKE 1 TABLET(10 MG) BY MOUTH DAILY -     Discontinue: lisinopril (PRINIVIL,ZESTRIL) 10 MG tablet; TAKE 1 TABLET(10 MG) BY MOUTH DAILY  Hypercalcemia    Patient Instructions       IF you received an x-ray today, you will receive an invoice from [facility]. Please contact [facility] at [phone] with questions or concerns regarding your invoice.   IF you received labwork today, you will receive an invoice from [facility]. Please contact [facility] at [phone] with questions or concerns regarding your invoice.   Our billing staff will not be able to assist you with questions regarding bills from these companies.  You will be contacted with the lab results as soon as they are available. The fastest way to get your results is to activate your My Chart account. Instructions are located on the last page of this paperwork. If you have not heard from [place] regarding the results in 2 weeks, please contact this office.     Bone Health Bones protect organs, store calcium, and anchor muscles. Good health habits, such as eating nutritious foods and exercising regularly, are important for maintaining healthy bones. They can also help to prevent a condition that causes bones to lose density and become weak and brittle (osteoporosis). Why is bone mass important? Bone mass refers to the amount of bone tissue that you have. The higher your bone mass, the stronger your bones. An important step toward having healthy bones throughout life is to have strong and dense bones during childhood. A young adult who has a high bone mass is more likely to have a high bone mass later in life. Bone mass at its greatest it is  called peak bone mass. A large decline in bone mass occurs in older adults. In women, it occurs about the time of menopause. During this time, it is important to practice good health habits, because if more bone is lost  than what is replaced, the bones will become less healthy and more likely to break (fracture). If you find that you have a low bone mass, you may be able to prevent osteoporosis or further bone loss by changing your diet and lifestyle. How can I find out if my bone mass is low? Bone mass can be measured with an X-ray test that is called a bone mineral density (BMD) test. This test is recommended for all women who are age 72 or older. It may also be recommended for men who are age 31 or older, or for people who are more likely to develop osteoporosis due to:  Having bones that break easily.  Having a long-term disease that weakens bones, such as kidney disease or rheumatoid arthritis.  Having menopause earlier than normal.  Taking medicine that weakens bones, such as steroids, thyroid hormones, or hormone treatment for breast cancer or prostate cancer.  Smoking.  Drinking three or more alcoholic drinks each day.  What are the nutritional recommendations for healthy bones? To have healthy bones, you need to get enough of the right minerals and vitamins. Most nutrition experts recommend getting these nutrients from the foods that you eat. Nutritional recommendations vary from person to person. Ask your health care provider what is healthy for you. Here are some general guidelines. Calcium Recommendations Calcium is the most important (essential) mineral for bone health. Most people can get enough calcium from their diet, but supplements may be recommended for people who are at risk for osteoporosis. Good sources of calcium include:  Dairy products, such as low-fat or nonfat milk, cheese, and yogurt.  Dark green leafy vegetables, such as bok choy and broccoli.  Calcium-fortified  foods, such as orange juice, cereal, bread, soy beverages, and tofu products.  Nuts, such as almonds.  Follow these recommended amounts for daily calcium intake:  Children, age 679?3: 700 mg.  Children, age 67?8: 1,000 mg.  Children, age 38?13: 1,300 mg.  Teens, age 25?18: 1,300 mg.  Adults, age 38?50: 1,000 mg.  Adults, age 34?70: ? Men: 1,000 mg. ? Women: 1,200 mg.  Adults, age 81 or older: 1,200 mg.  Pregnant and breastfeeding females: ? Teens: 1,300 mg. ? Adults: 1,000 mg.  Vitamin D Recommendations Vitamin D is the most essential vitamin for bone health. It helps the body to absorb calcium. Sunlight stimulates the skin to make vitamin D, so be sure to get enough sunlight. If you live in a cold climate or you do not get outside often, your health care provider may recommend that you take vitamin D supplements. Good sources of vitamin D in your diet include:  Egg yolks.  Saltwater fish.  Milk and cereal fortified with vitamin D.  Follow these recommended amounts for daily vitamin D intake:  Children and teens, age 29?18: 75 international units.  Adults, age 30 or younger: 400-800 international units.  Adults, age 30 or older: 800-1,000 international units.  Other Nutrients Other nutrients for bone health include:  Phosphorus. This mineral is found in meat, poultry, dairy foods, nuts, and legumes. The recommended daily intake for adult men and adult women is 700 mg.  Magnesium. This mineral is found in seeds, nuts, dark green vegetables, and legumes. The recommended daily intake for adult men is 400?420 mg. For adult women, it is 310?320 mg.  Vitamin K. This vitamin is found in green leafy vegetables. The recommended daily intake is 120 mg for adult men and 90 mg for adult women.  What type  of physical activity is best for building and maintaining healthy bones? Weight-bearing and strength-building activities are important for building and maintaining peak bone  mass. Weight-bearing activities cause muscles and bones to work against gravity. Strength-building activities increases muscle strength that supports bones. Weight-bearing and muscle-building activities include:  Walking and hiking.  Jogging and running.  Dancing.  Gym exercises.  Lifting weights.  Tennis and racquetball.  Climbing stairs.  Aerobics.  Adults should get at least 30 minutes of moderate physical activity on most days. Children should get at least 60 minutes of moderate physical activity on most days. Ask your health care provide what type of exercise is best for you. Where can I find more information? For more information, check out the following websites:  [place]: [employer]se  [employer] of Health: http://www.niams.AnonymousEar.fr.asp  This information is not intended to replace advice given to you by your health care provider. Make sure you discuss any questions you have with your health care provider. Document Released: 12/28/2003 Document Revised: 04/26/2016 Document Reviewed: 10/12/2014 Elsevier Interactive Patient Education  2018 Elsevier Inc.     Agustina Caroli, MD [facility]

## 2017-07-15 NOTE — Assessment & Plan Note (Signed)
Chronic pains have a bone component with osteopenia a major contributor; pt needs to be assesed for osteoporosis in view of this decrease in bone mass. DXA scan requested. Will treat pain with Tramadol as needed. Endocrinology evaluation requested last time for evaluation of hypercalcemia (10.6).

## 2017-07-15 NOTE — Patient Instructions (Addendum)
IF you received an x-ray today, you will receive an invoice from [facility]. Please contact [facility] at [phone] with questions or concerns regarding your invoice.   IF you received labwork today, you will receive an invoice from [place]. Please contact [facility] at [phone] with questions or concerns regarding your invoice.   Our billing staff will not be able to assist you with questions regarding bills from these companies.  You will be contacted with the lab results as soon as they are available. The fastest way to get your results is to activate your My Chart account. Instructions are located on the last page of this paperwork. If you have not heard from [place] regarding the results in 2 weeks, please contact this office.     Bone Health Bones protect organs, store calcium, and anchor muscles. Good health habits, such as eating nutritious foods and exercising regularly, are important for maintaining healthy bones. They can also help to prevent a condition that causes bones to lose density and become weak and brittle (osteoporosis). Why is bone mass important? Bone mass refers to the amount of bone tissue that you have. The higher your bone mass, the stronger your bones. An important step toward having healthy bones throughout life is to have strong and dense bones during childhood. A young adult who has a high bone mass is more likely to have a high bone mass later in life. Bone mass at its greatest it is called peak bone mass. A large decline in bone mass occurs in older adults. In women, it occurs about the time of menopause. During this time, it is important to practice good health habits, because if more bone is lost than what is replaced, the bones will become less healthy and more likely to break (fracture). If you find that you have a low bone mass, you may be able to prevent osteoporosis or further bone loss by changing your diet and lifestyle. How can I  find out if my bone mass is low? Bone mass can be measured with an X-ray test that is called a bone mineral density (BMD) test. This test is recommended for all women who are age 46 or older. It may also be recommended for men who are age 80 or older, or for people who are more likely to develop osteoporosis due to:  Having bones that break easily.  Having a long-term disease that weakens bones, such as kidney disease or rheumatoid arthritis.  Having menopause earlier than normal.  Taking medicine that weakens bones, such as steroids, thyroid hormones, or hormone treatment for breast cancer or prostate cancer.  Smoking.  Drinking three or more alcoholic drinks each day.  What are the nutritional recommendations for healthy bones? To have healthy bones, you need to get enough of the right minerals and vitamins. Most nutrition experts recommend getting these nutrients from the foods that you eat. Nutritional recommendations vary from person to person. Ask your health care provider what is healthy for you. Here are some general guidelines. Calcium Recommendations Calcium is the most important (essential) mineral for bone health. Most people can get enough calcium from their diet, but supplements may be recommended for people who are at risk for osteoporosis. Good sources of calcium include:  Dairy products, such as low-fat or nonfat milk, cheese, and yogurt.  Dark green leafy vegetables, such as bok choy and broccoli.  Calcium-fortified foods, such as orange juice, cereal, bread, soy beverages, and tofu products.  Nuts,  such as almonds.  Follow these recommended amounts for daily calcium intake:  Children, age 683?3: 700 mg.  Children, age 68?8: 1,000 mg.  Children, age 84?13: 1,300 mg.  Teens, age 52?18: 1,300 mg.  Adults, age 61?50: 1,000 mg.  Adults, age 81?70: ? Men: 1,000 mg. ? Women: 1,200 mg.  Adults, age 61 or older: 1,200 mg.  Pregnant and breastfeeding  females: ? Teens: 1,300 mg. ? Adults: 1,000 mg.  Vitamin D Recommendations Vitamin D is the most essential vitamin for bone health. It helps the body to absorb calcium. Sunlight stimulates the skin to make vitamin D, so be sure to get enough sunlight. If you live in a cold climate or you do not get outside often, your health care provider may recommend that you take vitamin D supplements. Good sources of vitamin D in your diet include:  Egg yolks.  Saltwater fish.  Milk and cereal fortified with vitamin D.  Follow these recommended amounts for daily vitamin D intake:  Children and teens, age 21?18: 81 international units.  Adults, age 687 or younger: 400-800 international units.  Adults, age 30 or older: 800-1,000 international units.  Other Nutrients Other nutrients for bone health include:  Phosphorus. This mineral is found in meat, poultry, dairy foods, nuts, and legumes. The recommended daily intake for adult men and adult women is 700 mg.  Magnesium. This mineral is found in seeds, nuts, dark green vegetables, and legumes. The recommended daily intake for adult men is 400?420 mg. For adult women, it is 310?320 mg.  Vitamin K. This vitamin is found in green leafy vegetables. The recommended daily intake is 120 mg for adult men and 90 mg for adult women.  What type of physical activity is best for building and maintaining healthy bones? Weight-bearing and strength-building activities are important for building and maintaining peak bone mass. Weight-bearing activities cause muscles and bones to work against gravity. Strength-building activities increases muscle strength that supports bones. Weight-bearing and muscle-building activities include:  Walking and hiking.  Jogging and running.  Dancing.  Gym exercises.  Lifting weights.  Tennis and racquetball.  Climbing stairs.  Aerobics.  Adults should get at least 30 minutes of moderate physical activity on most days.  Children should get at least 60 minutes of moderate physical activity on most days. Ask your health care provide what type of exercise is best for you. Where can I find more information? For more information, check out the following websites:  [place]: [employer]se  [employer] of Health: http://www.niams.AnonymousEar.fr.asp  This information is not intended to replace advice given to you by your health care provider. Make sure you discuss any questions you have with your health care provider. Document Released: 12/28/2003 Document Revised: 04/26/2016 Document Reviewed: 10/12/2014 Elsevier Interactive Patient Education  [employer].

## 2017-08-15 ENCOUNTER — Encounter: Payer: Self-pay | Admitting: Endocrinology

## 2017-08-15 ENCOUNTER — Ambulatory Visit (INDEPENDENT_AMBULATORY_CARE_PROVIDER_SITE_OTHER): Payer: Medicare Other | Admitting: Endocrinology

## 2017-08-15 ENCOUNTER — Other Ambulatory Visit: Payer: Self-pay

## 2017-08-15 DIAGNOSIS — E042 Nontoxic multinodular goiter: Secondary | ICD-10-CM

## 2017-08-15 MED ORDER — BASAGLAR KWIKPEN 100 UNIT/ML ~~LOC~~ SOPN: 15.0000 [IU] | PEN_INJECTOR | SUBCUTANEOUS | 11 refills | Status: DC

## 2017-08-15 MED ORDER — GLUCOSE BLOOD VI STRP: 1.0000 | ORAL_STRIP | Freq: Four times a day (QID) | 12 refills | Status: DC

## 2017-08-15 NOTE — Progress Notes (Signed)
Subjective:    Patient ID: Barbara Dennis, female    DOB: 1934/04/20, 83 y.o.   MRN: [id number]  HPI Pt is referred by Dr Mitchel Honour, for hypercalcemia.  Pt was noted to have moderate hypercalcemia in 2018 (it was normal in 2017). she is unaware of ever having osteoporosis, urolithiasis, thyroid probs, parathyroid probs, sarcoidosis, cancer, PUD, pancreatitis, recent severe injury, depression, or bony fracture.  she does not take vitamin-D or A supplements.  Pt denies taking antacids, Li++, or HCTZ.   She takes a vit-D supplement (uncertain dosage).  She has moderate pain at the lower back, and assoc myalgias Past Medical History:  Diagnosis Date  . Hypertension   . Mass of right side of neck     Past Surgical History:  Procedure Laterality Date  . APPENDECTOMY      Social History   Social History  . Marital status: Widowed    Spouse name: N/A  . Number of children: N/A  . Years of education: N/A   Occupational History  . Not on file.   Social History Main Topics  . Smoking status: Current Every Day Smoker    Packs/day: 0.70    Years: 60.00    Types: Cigarettes  . Smokeless tobacco: Never Used  . Alcohol use No  . Drug use: No  . Sexual activity: Not on file   Other Topics Concern  . Not on file   Social History Narrative   Marital status: widowed 28 years ago.  From [place]; moved to [place] 1974.     Children: none       Lives:  Alone; brother in law is Psychologist, sport and exercise who is 64.      Employment: retired.       Tobacco:  1/2 ppd       Alcohol:  None      Exercise:  Walking daily.      ADLs: no driving.    Current Outpatient Prescriptions on File Prior to Visit  Medication Sig Dispense Refill  . acetaminophen (TYLENOL ARTHRITIS PAIN) 650 MG CR tablet Take 1 tablet (650 mg total) by mouth every 12 (twelve) hours. For joint pain 180 tablet 3  . albuterol (PROVENTIL HFA;VENTOLIN HFA) 108 (90 Base) MCG/ACT inhaler Inhale 2 puffs into the lungs every 4 (four) hours as needed for  wheezing or shortness of breath (cough, shortness of breath or wheezing.). 1 Inhaler 1  . Calcium Carbonate-Vitamin D3 (CALCIUM 600/VITAMIN D) 600-400 MG-UNIT TABS Take 1 tablet by mouth 2 (two) times daily. 180 tablet 3  . Glucos-Chond-Hyal Ac-Ca Fructo (MOVE FREE JOINT HEALTH ADVANCE) TABS Take 1 tablet by mouth 2 (two) times daily. Or as instructed by packaging 60 tablet 3  . lisinopril (PRINIVIL,ZESTRIL) 10 MG tablet TAKE 1 TABLET(10 MG) BY MOUTH DAILY 90 tablet 3  . methocarbamol (ROBAXIN) 500 MG tablet TAKE 1 TABLET(500 MG) BY MOUTH AT BEDTIME AS NEEDED FOR MUSCLE SPASMS 30 tablet 0  . omeprazole (PRILOSEC) 20 MG capsule Take 2 capsules (40 mg total) by mouth 2 (two) times daily. 30 minutes prior to breakfast 60 capsule 1  . traMADol (ULTRAM) 50 MG tablet Take 1 tablet (50 mg total) by mouth every 8 (eight) hours as needed for severe pain. 20 tablet 0  . fexofenadine (ALLEGRA) 30 MG tablet Take 30 mg by mouth 2 (two) times daily.    . meloxicam (MOBIC) 7.5 MG tablet Take 1 tablet (7.5 mg total) by mouth daily. (Patient not taking: Reported on 05/01/2017) 20  tablet 0  . polyethylene glycol powder (GLYCOLAX/MIRALAX) powder Take 17 g by mouth 2 (two) times daily as needed. (Patient not taking: Reported on 08/15/2017) 289 g 1  . ranitidine (ZANTAC) 150 MG tablet Take 1 tablet (150 mg total) by mouth 2 (two) times daily as needed for heartburn (or indigestion). (Patient not taking: Reported on 07/15/2017) 180 tablet 3  . tiZANidine (ZANAFLEX) 2 MG tablet Take 1 tablet (2 mg total) by mouth at bedtime. (Patient not taking: Reported on 08/15/2017) 30 tablet 2   No current facility-administered medications on file prior to visit.     Not on File  Family History  Problem Relation Age of Onset  . Hypercalcemia Neg Hx     BP 128/66   Pulse 78   Wt 101 lb 6.4 oz (46 kg)   SpO2 97%   BMI 20.83 kg/m    Review of Systems Denies weight loss, visual loss, edema, diarrhea, sore throat, polyuria,  hematuria, rash, depression, or numbness. she has a slight cough.      Objective:   Physical Exam VS: see vs page GEN: no distress HEAD: head: no deformity eyes: no periorbital swelling, no proptosis external nose and ears are normal mouth: no lesion seen NECK: small multinodular goiter.  Largest palpable nodule is at the mid-left CHEST WALL: no deformity.  No kyphosis LUNGS: clear to auscultation CV: reg rate and rhythm, no murmur ABD: abdomen is soft, nontender.  no hepatosplenomegaly.  not distended.  no hernia MUSCULOSKELETAL: muscle bulk and strength are grossly normal.  no obvious joint swelling.  gait is steady with a cane EXTEMITIES: no deformity.  no ulcer on the feet.  feet are of normal color and temp.  no edema PULSES: dorsalis pedis intact bilat.  no carotid bruit NEURO:  cn 2-12 grossly intact.   readily moves all 4's.  sensation is intact to touch on the feet SKIN:  Normal texture and temperature.  No rash or suspicious lesion is visible.   NODES:  None palpable at the neck PSYCH: alert, well-oriented.  Does not appear anxious nor depressed.    Lab Results  Component Value Date   CALCIUM 10.6 (H) 06/16/2017   L-Spine x-rays: Diffuse osteopenia.  Lab Results  Component Value Date   ALT 14 06/16/2017   AST 19 06/16/2017   ALKPHOS 74 06/16/2017   BILITOT 0.2 06/16/2017   CXR: COPD changes with chronic interstitial prominence and old granulomatous disease.  I have reviewed outside records, and summarized: Pt was noted to have elevated Ca++, and referred here.  Main prob addressed was MSK pain.  DEXA was ordered for abnormal x-ray     Assessment & Plan:  Hypercalcemia, new to me, uncertain etiology Osteoporosis, uncertain etiology, but prob related tot he above.  Hyperglycemia: I checked A1c at pt's request.  It is fine  Patient Instructions  blood tests are requested for you today.  We'll send you a letter with the results. it is critically important to  prevent falling down (keep floor areas well-lit, dry, and free of loose objects.  If you have a cane, walker, or wheelchair, you should use it, even for short trips around the house.  Wear flat-soled shoes.  Also, try not to rush).     Let's check the ultrasound.  you will receive a phone call, about a day and time for an appointment.   Please come back for a follow-up appointment in 1 month.

## 2017-08-15 NOTE — Patient Instructions (Addendum)
blood tests are requested for you today.  We'll send you a letter with the results. it is critically important to prevent falling down (keep floor areas well-lit, dry, and free of loose objects.  If you have a cane, walker, or wheelchair, you should use it, even for short trips around the house.  Wear flat-soled shoes.  Also, try not to rush).     Let's check the ultrasound.  you will receive a phone call, about a day and time for an appointment.   Please come back for a follow-up appointment in 1 month.

## 2017-08-20 ENCOUNTER — Encounter: Payer: Self-pay | Admitting: Emergency Medicine

## 2017-08-20 ENCOUNTER — Ambulatory Visit (INDEPENDENT_AMBULATORY_CARE_PROVIDER_SITE_OTHER): Payer: Medicare Other | Admitting: Emergency Medicine

## 2017-08-20 VITALS — BP 130/56 | HR 83 | Temp 98.0°F | Resp 16 | Ht 58.25 in | Wt 99.4 lb

## 2017-08-20 DIAGNOSIS — M7918 Myalgia, other site: Secondary | ICD-10-CM | POA: Diagnosis not present

## 2017-08-20 DIAGNOSIS — M79605 Pain in left leg: Secondary | ICD-10-CM

## 2017-08-20 DIAGNOSIS — M545 Low back pain: Secondary | ICD-10-CM

## 2017-08-20 DIAGNOSIS — J449 Chronic obstructive pulmonary disease, unspecified: Secondary | ICD-10-CM | POA: Diagnosis not present

## 2017-08-20 DIAGNOSIS — F17209 Nicotine dependence, unspecified, with unspecified nicotine-induced disorders: Secondary | ICD-10-CM

## 2017-08-20 DIAGNOSIS — M79604 Pain in right leg: Secondary | ICD-10-CM

## 2017-08-20 DIAGNOSIS — G8929 Other chronic pain: Secondary | ICD-10-CM | POA: Diagnosis not present

## 2017-08-20 MED ORDER — VARENICLINE TARTRATE 0.5 MG PO TABS: 0.5000 mg | ORAL_TABLET | Freq: Two times a day (BID) | ORAL | 1 refills | Status: DC

## 2017-08-20 MED ORDER — TRAMADOL HCL 50 MG PO TABS: 50.0000 mg | ORAL_TABLET | Freq: Three times a day (TID) | ORAL | 0 refills | Status: DC | PRN

## 2017-08-20 NOTE — Progress Notes (Signed)
Barbara Dennis 83 y.o.   Chief Complaint  Patient presents with  . Medication Refill    Tramadol and per patient back,buttock and feet ALL LEFT SIDE    HISTORY OF PRESENT ILLNESS: This is a 83 y.o. female with h/o chronic pains; needs Tramadol refill. Also wants to quit smoking. Recently seen by Endo for evaluation of hypercalcemia. Notes reviewed.  Back Pain  This is a chronic problem. The current episode started more than 1 year ago. The problem occurs constantly. The problem is unchanged. The pain is present in the lumbar spine. The pain radiates to the left thigh, right thigh, right knee and left knee. The pain is at a severity of 5/10. The pain is moderate. The symptoms are aggravated by bending, position and twisting. Associated symptoms include leg pain. Pertinent negatives include no abdominal pain, bladder incontinence, bowel incontinence, dysuria, fever, numbness, paresthesias, pelvic pain, tingling or weakness. Risk factors: none. She has tried nothing for the symptoms.     Prior to Admission medications   Medication Sig Start Date End Date Taking? Authorizing Provider  acetaminophen (TYLENOL ARTHRITIS PAIN) 650 MG CR tablet Take 1 tablet (650 mg total) by mouth every 12 (twelve) hours. For joint pain 02/22/16  Yes Shawnee Knapp, MD  albuterol (PROVENTIL HFA;VENTOLIN HFA) 108 (90 Base) MCG/ACT inhaler Inhale 2 puffs into the lungs every 4 (four) hours as needed for wheezing or shortness of breath (cough, shortness of breath or wheezing.). 01/21/17  Yes English, Colletta [place] D, PA  Calcium Carbonate-Vitamin D3 (CALCIUM 600/VITAMIN D) 600-400 MG-UNIT TABS Take 1 tablet by mouth 2 (two) times daily. 02/22/16  Yes Shawnee Knapp, MD  lisinopril (PRINIVIL,ZESTRIL) 10 MG tablet TAKE 1 TABLET(10 MG) BY MOUTH DAILY 07/15/17  Yes Donny Heffern, Ines Bloomer, MD  omeprazole (PRILOSEC) 20 MG capsule Take 2 capsules (40 mg total) by mouth 2 (two) times daily. 30 minutes prior to breakfast 04/08/16  Yes Shawnee Knapp, MD  ranitidine (ZANTAC) 150 MG tablet Take 1 tablet (150 mg total) by mouth 2 (two) times daily as needed for heartburn (or indigestion). 02/22/16  Yes Shawnee Knapp, MD  traMADol (ULTRAM) 50 MG tablet Take 1 tablet (50 mg total) by mouth every 8 (eight) hours as needed for severe pain. 08/20/17  Yes Aira Sallade, Ines Bloomer, MD  fexofenadine (ALLEGRA) 30 MG tablet Take 30 mg by mouth 2 (two) times daily.    [provider]  Glucos-Chond-Hyal Ac-Ca Fructo (MOVE FREE JOINT HEALTH ADVANCE) TABS Take 1 tablet by mouth 2 (two) times daily. Or as instructed by packaging Patient not taking: Reported on 08/20/2017 02/22/16   Shawnee Knapp, MD  meloxicam (MOBIC) 7.5 MG tablet Take 1 tablet (7.5 mg total) by mouth daily. Patient not taking: Reported on 05/01/2017 02/10/17   Ivar Drape D, PA  methocarbamol (ROBAXIN) 500 MG tablet TAKE 1 TABLET(500 MG) BY MOUTH AT BEDTIME AS NEEDED FOR MUSCLE SPASMS Patient not taking: Reported on 08/20/2017 04/18/17   Ivar Drape D, PA  polyethylene glycol powder (GLYCOLAX/MIRALAX) powder Take 17 g by mouth 2 (two) times daily as needed. Patient not taking: Reported on 08/15/2017 02/27/17   Ivar Drape D, PA  tiZANidine (ZANAFLEX) 2 MG tablet Take 1 tablet (2 mg total) by mouth at bedtime. Patient not taking: Reported on 08/15/2017 02/22/16   Shawnee Knapp, MD  varenicline (CHANTIX) 0.5 MG tablet Take 1 tablet (0.5 mg total) by mouth 2 (two) times daily. 08/20/17   Horald Pollen, MD  No Known Allergies  Patient Active Problem List   Diagnosis Date Noted  . Chronic obstructive pulmonary disease ([place]) 08/20/2017  . Multinodular goiter 08/15/2017  . Musculoskeletal pain 07/15/2017  . Bilateral leg pain 07/15/2017  . Chronic bilateral low back pain without sciatica 07/15/2017  . Osteopenia of multiple sites 07/15/2017  . Essential hypertension, benign 07/15/2017  . Hypercalcemia 07/15/2017  . Cough 06/16/2017  . Bad odor of urine 06/16/2017    . Atypical chest pain 06/16/2017  . Sciatica of left side 03/10/2017  . Depression with anxiety 01/06/2015  . Cervical adenopathy 03/31/2014  . 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 HTN, goal below 150/90 06/17/2013  . DJD (degenerative joint disease) 02/16/2013  . Tobacco user 09/23/2012  . Menopause 08/21/2012  . Postmenopausal atrophic vaginitis 08/21/2012    Past Medical History:  Diagnosis Date  . Hypertension   . Mass of right side of neck     Past Surgical History:  Procedure Laterality Date  . APPENDECTOMY      Social History   Social History  . Marital status: Widowed    Spouse name: N/A  . Number of children: N/A  . Years of education: N/A   Occupational History  . Not on file.   Social History Main Topics  . Smoking status: Current Every Day Smoker    Packs/day: 0.70    Years: 60.00    Types: Cigarettes  . Smokeless tobacco: Never Used  . Alcohol use No  . Drug use: No  . Sexual activity: Not on file   Other Topics Concern  . Not on file   Social History Narrative   Marital status: widowed 28 years ago.  From [place]; moved to [place] 1974.     Children: none       Lives:  Alone; brother in law is Psychologist, sport and exercise who is 30.      Employment: retired.       Tobacco:  1/2 ppd       Alcohol:  None      Exercise:  Walking daily.      ADLs: no driving.    Family History  Problem Relation Age of Onset  . Hypercalcemia Neg Hx      Review of Systems  Constitutional: Negative.  Negative for fever.  HENT: Negative.   Eyes: Negative.   Respiratory: Negative.  Negative for hemoptysis and shortness of breath.   Cardiovascular: Negative.  Negative for palpitations.  Gastrointestinal: Negative.  Negative for abdominal pain, bowel incontinence and diarrhea.  Genitourinary: Negative.  Negative for bladder incontinence, dysuria, hematuria and pelvic pain.  Musculoskeletal: Positive for back pain (chronic).  Skin: Negative.   Neurological: Negative.   Negative for dizziness, tingling, weakness, numbness and paresthesias.  Endo/Heme/Allergies: Negative.   All other systems reviewed and are negative.   Vitals:   08/20/17 1211  BP: (!) 130/56  Pulse: 83  Resp: 16  Temp: 98 F (36.7 C)  SpO2: 96%    Physical Exam  Constitutional: She is oriented to person, place, and time. She appears well-developed and well-nourished.  HENT:  Head: Normocephalic and atraumatic.  Eyes: Pupils are equal, round, and reactive to light. EOM are normal.  Neck: Normal range of motion.  Cardiovascular: Normal rate, regular rhythm and normal heart sounds.   Pulmonary/Chest: Effort normal and breath sounds normal.  Abdominal: Soft. She exhibits no distension. There is no tenderness.  Musculoskeletal: Normal range of motion.  Neurological: She is alert and oriented to person, place,  and time.  Skin: Skin is warm and dry. Capillary refill takes less than 2 seconds. No rash noted.  Psychiatric: She has a normal mood and affect. Her behavior is normal.  Vitals reviewed.    ASSESSMENT & PLAN: Trellis was seen today for medication refill.  Diagnoses and all orders for this visit:  Chronic bilateral low back pain without sciatica -     traMADol (ULTRAM) 50 MG tablet; Take 1 tablet (50 mg total) by mouth every 8 (eight) hours as needed for severe pain.  Musculoskeletal pain -     traMADol (ULTRAM) 50 MG tablet; Take 1 tablet (50 mg total) by mouth every 8 (eight) hours as needed for severe pain.  Bilateral leg pain -     traMADol (ULTRAM) 50 MG tablet; Take 1 tablet (50 mg total) by mouth every 8 (eight) hours as needed for severe pain.  Tobacco use disorder, continuous -     Ambulatory referral to Smoking Cessation Program -     varenicline (CHANTIX) 0.5 MG tablet; Take 1 tablet (0.5 mg total) by mouth 2 (two) times daily.  Chronic obstructive pulmonary disease, unspecified COPD type ([place]) -     Ambulatory referral to Pulmonology   A total of 25  minutes was spent in the room with the patient, greater than 50% of which was in counseling/coordination of care.   Patient Instructions       IF you received an x-ray today, you will receive an invoice from [facility]. Please contact [facility] at [phone] with questions or concerns regarding your invoice.   IF you received labwork today, you will receive an invoice from [facility]. Please contact [facility] at [phone] with questions or concerns regarding your invoice.   Our billing staff will not be able to assist you with questions regarding bills from these companies.  You will be contacted with the lab results as soon as they are available. The fastest way to get your results is to activate your My Chart account. Instructions are located on the last page of this paperwork. If you have not heard from [place] regarding the results in 2 weeks, please contact this office.     Musculoskeletal Pain Musculoskeletal pain is muscle and bone aches and pains. This pain can occur in any part of the body. Follow these instructions at home:  Only take medicines for pain, discomfort, or fever as told by your health care provider.  You may continue all activities unless the activities cause more pain. When the pain lessens, slowly resume normal activities. Gradually increase the intensity and duration of the activities or exercise.  During periods of severe pain, bed rest may be helpful. Lie or sit in any position that is comfortable, but get out of bed and walk around at least every several hours.  If directed, put ice on the injured area. ? Put ice in a plastic bag. ? Place a towel between your skin and the bag. ? Leave the ice on for 20 minutes, 2-3 times a day. Contact a health care provider if:  Your pain is getting worse.  Your pain is not relieved with medicines.  You lose function in the area of the pain if the pain is in your arms, legs, or neck. This  information is not intended to replace advice given to you by your health care provider. Make sure you discuss any questions you have with your health care provider. Document Released: 10/07/2005 Document Revised: 03/19/2016 Document Reviewed: 06/11/2013 Elsevier  Interactive Patient Education  2017 Elsevier Inc.     Agustina Caroli, MD [facility]

## 2017-08-20 NOTE — Patient Instructions (Addendum)
     IF you received an x-ray today, you will receive an invoice from [facility]. Please contact [facility] at [phone] with questions or concerns regarding your invoice.   IF you received labwork today, you will receive an invoice from [facility]. Please contact [facility] at [phone] with questions or concerns regarding your invoice.   Our billing staff will not be able to assist you with questions regarding bills from these companies.  You will be contacted with the lab results as soon as they are available. The fastest way to get your results is to activate your My Chart account. Instructions are located on the last page of this paperwork. If you have not heard from us regarding the results in 2 weeks, please contact this office.     Musculoskeletal Pain Musculoskeletal pain is muscle and bone aches and pains. This pain can occur in any part of the body. Follow these instructions at home:  Only take medicines for pain, discomfort, or fever as told by your health care provider.  You may continue all activities unless the activities cause more pain. When the pain lessens, slowly resume normal activities. Gradually increase the intensity and duration of the activities or exercise.  During periods of severe pain, bed rest may be helpful. Lie or sit in any position that is comfortable, but get out of bed and walk around at least every several hours.  If directed, put ice on the injured area. ? Put ice in a plastic bag. ? Place a towel between your skin and the bag. ? Leave the ice on for 20 minutes, 2-3 times a day. Contact a health care provider if:  Your pain is getting worse.  Your pain is not relieved with medicines.  You lose function in the area of the pain if the pain is in your arms, legs, or neck. This information is not intended to replace advice given to you by your health care provider. Make sure you discuss any questions you have with your health  care provider. Document Released: 10/07/2005 Document Revised: 03/19/2016 Document Reviewed: 06/11/2013 Elsevier Interactive Patient Education  2017 Elsevier Inc.  

## 2017-08-21 LAB — PROTEIN ELECTROPHORESIS, SERUM
ALPHA 1: 0.3 g/dL (ref 0.2–0.3)
Albumin ELP: 4 g/dL (ref 3.8–4.8)
Alpha 2: 0.8 g/dL (ref 0.5–0.9)
BETA 2: 0.4 g/dL (ref 0.2–0.5)
Beta Globulin: 0.5 g/dL (ref 0.4–0.6)
GAMMA GLOBULIN: 1.6 g/dL (ref 0.8–1.7)
TOTAL PROTEIN: 7.6 g/dL (ref 6.1–8.1)

## 2017-08-21 LAB — VITAMIN D 1,25 DIHYDROXY
VITAMIN D 1, 25 (OH) TOTAL: 41 pg/mL (ref 18–72)
VITAMIN D3 1, 25 (OH): 41 pg/mL

## 2017-08-21 LAB — T4, FREE: FREE T4: 1.2 ng/dL (ref 0.8–1.8)

## 2017-08-21 LAB — PTH-RELATED PEPTIDE: PTH-RELATED PROTEIN (PTH-RP): 15 pg/mL (ref 14–27)

## 2017-08-21 LAB — PTH, INTACT AND CALCIUM
Calcium: 9.6 mg/dL (ref 8.6–10.4)
PTH: 35 pg/mL (ref 14–64)

## 2017-08-21 LAB — VITAMIN A: Vitamin A (Retinoic Acid): 50 ug/dL (ref 38–98)

## 2017-08-21 LAB — TSH: TSH: 0.64 mIU/L (ref 0.40–4.50)

## 2017-08-21 LAB — VITAMIN D 25 HYDROXY (VIT D DEFICIENCY, FRACTURES): VIT D 25 HYDROXY: 64 ng/mL (ref 30–100)

## 2017-08-22 ENCOUNTER — Other Ambulatory Visit: Payer: Self-pay

## 2017-09-05 ENCOUNTER — Ambulatory Visit
Admission: RE | Admit: 2017-09-05 | Discharge: 2017-09-05 | Disposition: A | Discharge status: Home / Self Care | Payer: Medicare Other | Source: Ambulatory Visit | Attending: Endocrinology | Admitting: Endocrinology

## 2017-09-05 DIAGNOSIS — E042 Nontoxic multinodular goiter: Secondary | ICD-10-CM | POA: Diagnosis not present

## 2017-09-09 ENCOUNTER — Ambulatory Visit (INDEPENDENT_AMBULATORY_CARE_PROVIDER_SITE_OTHER): Payer: Medicare Other | Admitting: Endocrinology

## 2017-09-09 ENCOUNTER — Encounter: Payer: Self-pay | Admitting: Endocrinology

## 2017-09-09 DIAGNOSIS — M25562 Pain in left knee: Secondary | ICD-10-CM

## 2017-09-09 DIAGNOSIS — M25561 Pain in right knee: Secondary | ICD-10-CM | POA: Diagnosis not present

## 2017-09-09 DIAGNOSIS — G8929 Other chronic pain: Secondary | ICD-10-CM | POA: Diagnosis not present

## 2017-09-09 NOTE — Progress Notes (Signed)
Subjective:    Patient ID: Barbara Dennis, female    DOB: 04/04/1934, 83 y.o.   MRN: [id number]  HPI Pt returns for f/u of hypercalcemia (dx'ed 2018 (it was normal in 2017); she has h/o low bone density on plain films of the lower back; she has no h/o urolithiasis or bony fracture; she prefers to receive results via letter).  pt states she feels well in general, except for moderate bilat knee pain, but no assoc numbness.   Past Medical History:  Diagnosis Date  . Hypertension   . Mass of right side of neck     Past Surgical History:  Procedure Laterality Date  . APPENDECTOMY      Social History   Socioeconomic History  . Marital status: Widowed    Spouse name: Not on file  . Number of children: Not on file  . Years of education: Not on file  . Highest education level: Not on file  Social Needs  . Financial resource strain: Not on file  . Food insecurity - worry: Not on file  . Food insecurity - inability: Not on file  . Transportation needs - medical: Not on file  . Transportation needs - non-medical: Not on file  Occupational History  . Not on file  Tobacco Use  . Smoking status: Current Every Day Smoker    Packs/day: 0.70    Years: 60.00    Pack years: 42.00    Types: Cigarettes  . Smokeless tobacco: Never Used  Substance and Sexual Activity  . Alcohol use: No    Alcohol/week: 0.0 oz  . Drug use: No  . Sexual activity: Not on file  Other Topics Concern  . Not on file  Social History Narrative   Marital status: widowed 28 years ago.  From [place]; moved to [place] 1974.     Children: none       Lives:  Alone; brother in law is Psychologist, sport and exercise who is 73.      Employment: retired.       Tobacco:  1/2 ppd       Alcohol:  None      Exercise:  Walking daily.      ADLs: no driving.    Current Outpatient Medications on File Prior to Visit  Medication Sig Dispense Refill  . acetaminophen (TYLENOL ARTHRITIS PAIN) 650 MG CR tablet Take 1 tablet (650 mg total) by mouth every 12  (twelve) hours. For joint pain 180 tablet 3  . albuterol (PROVENTIL HFA;VENTOLIN HFA) 108 (90 Base) MCG/ACT inhaler Inhale 2 puffs into the lungs every 4 (four) hours as needed for wheezing or shortness of breath (cough, shortness of breath or wheezing.). 1 Inhaler 1  . Calcium Carbonate-Vitamin D3 (CALCIUM 600/VITAMIN D) 600-400 MG-UNIT TABS Take 1 tablet by mouth 2 (two) times daily. 180 tablet 3  . fexofenadine (ALLEGRA) 30 MG tablet Take 30 mg by mouth 2 (two) times daily.    . Glucos-Chond-Hyal Ac-Ca Fructo (MOVE FREE JOINT HEALTH ADVANCE) TABS Take 1 tablet by mouth 2 (two) times daily. Or as instructed by packaging 60 tablet 3  . lisinopril (PRINIVIL,ZESTRIL) 10 MG tablet TAKE 1 TABLET(10 MG) BY MOUTH DAILY 90 tablet 3  . meloxicam (MOBIC) 7.5 MG tablet Take 1 tablet (7.5 mg total) by mouth daily. 20 tablet 0  . methocarbamol (ROBAXIN) 500 MG tablet TAKE 1 TABLET(500 MG) BY MOUTH AT BEDTIME AS NEEDED FOR MUSCLE SPASMS 30 tablet 0  . omeprazole (PRILOSEC) 20 MG capsule Take 2  capsules (40 mg total) by mouth 2 (two) times daily. 30 minutes prior to breakfast 60 capsule 1  . polyethylene glycol powder (GLYCOLAX/MIRALAX) powder Take 17 g by mouth 2 (two) times daily as needed. 289 g 1  . ranitidine (ZANTAC) 150 MG tablet Take 1 tablet (150 mg total) by mouth 2 (two) times daily as needed for heartburn (or indigestion). 180 tablet 3  . tiZANidine (ZANAFLEX) 2 MG tablet Take 1 tablet (2 mg total) by mouth at bedtime. 30 tablet 2  . traMADol (ULTRAM) 50 MG tablet Take 1 tablet (50 mg total) by mouth every 8 (eight) hours as needed for severe pain. 20 tablet 0  . varenicline (CHANTIX) 0.5 MG tablet Take 1 tablet (0.5 mg total) by mouth 2 (two) times daily. 60 tablet 1   No current facility-administered medications on file prior to visit.     No Known Allergies  Family History  Problem Relation Age of Onset  . Hypercalcemia Neg Hx     BP 130/72 (BP Location: Left Arm, Patient Position:  Sitting, Cuff Size: Normal)   Pulse 79   Wt 99 lb 12.8 oz (45.3 kg)   SpO2 97%   BMI 20.68 kg/m   Review of Systems Denies cramps and abd pain.     Objective:   Physical Exam VITAL SIGNS:  See vs page GENERAL: no distress.  Knees:  No swell/tend/erythema/warmth.  Full ROM without pain.   Gait steady with a cane.      Assessment & Plan:  Knee pain, new to me, bilateral: she declines ref sports medicine.    Hypercalcemia, uncertain etiology, resolved.  We'll follow.  abnormal TFT, normal on recheck.  We'll follow.     Patient Instructions  it is critically important to prevent falling down (keep floor areas well-lit, dry, and free of loose objects.  If you have a cane, walker, or wheelchair, you should use it, even for short trips around the house.  Wear flat-soled shoes.  Also, try not to rush).     No further testing is needed now.  However, most of the time, a "lumpy thyroid" will eventually become overactive.  this is usually a slow process, happening over the span of many years.   Therefore, you should have your thyroid blood tests 1-2 times per year.   Please see a specialist for your knees.  you will receive a phone call, about a day and time for an appointment.  Their number is [phone]. Please come back for a follow-up appointment in 6 months.

## 2017-09-09 NOTE — Patient Instructions (Addendum)
it is critically important to prevent falling down (keep floor areas well-lit, dry, and free of loose objects.  If you have a cane, walker, or wheelchair, you should use it, even for short trips around the house.  Wear flat-soled shoes.  Also, try not to rush).     No further testing is needed now.  However, most of the time, a "lumpy thyroid" will eventually become overactive.  this is usually a slow process, happening over the span of many years.   Therefore, you should have your thyroid blood tests 1-2 times per year.   Please see a specialist for your knees.  you will receive a phone call, about a day and time for an appointment.  Their number is [phone]. Please come back for a follow-up appointment in 6 months.

## 2017-09-10 ENCOUNTER — Telehealth: Payer: Self-pay | Admitting: Endocrinology

## 2017-09-10 NOTE — Telephone Encounter (Signed)
Please schedule DEXA at [facility], and notify pt of the appt by letter, thanks.

## 2017-09-16 NOTE — Addendum Note (Signed)
Addended by: Renato Shin on: 09/16/2017 02:37 PM   Modules accepted: Orders

## 2017-09-16 NOTE — Telephone Encounter (Signed)
I have printed & sent letter out VIA [employer].

## 2017-09-16 NOTE — Telephone Encounter (Signed)
There is no DEXA scan ordered for patient. Please put in order.

## 2017-09-16 NOTE — Telephone Encounter (Signed)
Ok, I ordered, thanks.

## 2017-09-23 ENCOUNTER — Ambulatory Visit (INDEPENDENT_AMBULATORY_CARE_PROVIDER_SITE_OTHER): Payer: Medicare Other | Admitting: Sports Medicine

## 2017-09-23 ENCOUNTER — Encounter: Payer: Self-pay | Admitting: Sports Medicine

## 2017-09-23 VITALS — BP 153/70 | Ht 60.0 in | Wt 100.0 lb

## 2017-09-23 DIAGNOSIS — M542 Cervicalgia: Secondary | ICD-10-CM

## 2017-09-23 MED ORDER — DICLOFENAC SODIUM 1 % TD GEL: 2.0000 g | Freq: Four times a day (QID) | TRANSDERMAL | 1 refills | Status: DC

## 2017-09-23 MED ORDER — GABAPENTIN 100 MG PO CAPS: 100.0000 mg | ORAL_CAPSULE | Freq: Every day | ORAL | 1 refills | Status: DC

## 2017-09-23 NOTE — Progress Notes (Signed)
Subjective:    Patient ID: Barbara Dennis, female    DOB: 1934-05-04, 83 y.o.   MRN: [id number]  Ms. Date is a 83 yo [place] (very little english) joined by her daughter she has a PMH of HTN, but denies thyroid disease or diabetes. An interpreter was used for the entirety of the visit. She presents today complaining of bilateral shoulder pain, low back pain and tingling in her arms, hands and feet. These pains have been going on for years and causing her to be less active. She complains of bilateral shoulder pain that radiates to the neck. She will sometimes feel a numbness down both arms and to all five fingers bilaterally and that will last for about 20 minutes. She denies any known falls or trauma. After periods of long sitting she will feel a stabbing low back pain. She denies any weakness. She was prescribed some pain medicine but does not know the name. Per chart review, it appears that she was recently started on Tramadol, but also takes Zanaflex, Mobic and Tylenol. She walks with a cane and does not currently drive. She reports more pain with walking up the stairs vs walking down the stairs.      Review of Systems  Musculoskeletal: Positive for arthralgias (b/l shoulder pain), back pain and neck pain.       Objective:   Physical Exam  Constitutional: She is oriented to person, place, and time. She appears well-developed and well-nourished. No distress.  HENT:  Head: Normocephalic and atraumatic.  Eyes: Conjunctivae and EOM are normal. Pupils are equal, round, and reactive to light.  Neck: Normal range of motion. Neck supple. No tracheal deviation present.  Pulmonary/Chest: Effort normal. No stridor.  Neurological: She is alert and oriented to person, place, and time.  Skin: Skin is warm and dry. She is not diaphoretic.  Psychiatric: She has a normal mood and affect. Her behavior is normal.   MSK: Shoulders: No obvious deformity on inspection, tenderness to palpation over the  [facility] joint b/l. No bony tenderness of the cervical spine. Full ROM b/l with forward flexion and abduction of arms without pain. Is able to perform empty can testing of both arms without pain with and without active resistance. Able to internally and externally rotate arms at waist without pain with active resistance and without pain. Is able to cross both shoulder's (Scarf test) b/l without pain. Straight cross arm test with resistance ([place]ri[place]) negative for pain. Strength in the deltoid, biceps, triceps, wrist extension/flexion, hand grip 4+/5 bilaterally  Back: No bony tenderness in the upper, middle and low back  Hips: No bony tenderness along the ASIS, iliac crest and PSIS, hip roll negative for pain bilaterally, straight leg raise test negative bilaterally, hip flexion 4+/5 bilaterally.  Ankles: No deformity noted on inspection, full ROM without pain  Feet: Sensation intact distally to light touch bilaterally. No swelling, no callouses noted. No tenderness to palpation over the MT. MT squeeze test negative bilaterally      Assessment & Plan:  #Chronic Shoulder Pain #Chronic low back pain with sciatica #Radicular pain with disk disease and possible stenosis Chronic. Stable. Reviewed XR images and report of lumbar spine in September. Reviewed last PCP note from October. Pain is likely secondary to referred pain from the spine. Will treat the Neuropathy as stated below. Unclear what pain medicine patient is using currently (Zanaflex, Mobic, Tylenol, Tramadol), will bring medicine with her to next appointment to avoid polypharmacy. Would prefer to  use topical pain management on this patient given her age. Once pain is more controlled, she may benefit from formal PT training. -Voltaren Gel PRN to areas of pain -Cervical spine XR -RTC in 4 weeks for follow up; will review xray images, pain management and discuss possible PT training.  #Neuropathy of both hands and feet As evidenced by reported  numbness by patient. Denies any weakness. Will treat with a low dose gabapentin and increase as needed. May also consider Alpha Lipoic acid -Gabapentin 100mg  PO QHS  #Severe Osteopenia Optimize medical treatment for Osteopenia, patient followed by Endocrine for hypercalcemia. Reviewed last note on 09/09/2017 -Defer to PCP  Used an interpreter for the entirety of the office visit.   Total time spent with the patient was 30 minutes with greater than 50% of the time spent in face to face consultation discussing workup and treatment.

## 2017-09-24 ENCOUNTER — Encounter: Payer: Self-pay | Admitting: Sports Medicine

## 2017-09-24 ENCOUNTER — Ambulatory Visit
Admission: RE | Admit: 2017-09-24 | Discharge: 2017-09-24 | Disposition: A | Discharge status: Home / Self Care | Payer: Medicare Other | Source: Ambulatory Visit | Attending: Sports Medicine | Admitting: Sports Medicine

## 2017-09-24 DIAGNOSIS — M542 Cervicalgia: Secondary | ICD-10-CM

## 2017-10-23 ENCOUNTER — Other Ambulatory Visit: Payer: Medicare Other

## 2017-10-27 ENCOUNTER — Ambulatory Visit: Payer: Medicare Other | Admitting: Sports Medicine

## 2017-10-27 DIAGNOSIS — M79604 Pain in right leg: Secondary | ICD-10-CM | POA: Diagnosis not present

## 2017-10-27 DIAGNOSIS — G8929 Other chronic pain: Secondary | ICD-10-CM

## 2017-10-27 DIAGNOSIS — M7918 Myalgia, other site: Secondary | ICD-10-CM | POA: Diagnosis not present

## 2017-10-27 DIAGNOSIS — M79605 Pain in left leg: Secondary | ICD-10-CM | POA: Diagnosis not present

## 2017-10-27 DIAGNOSIS — M545 Low back pain: Secondary | ICD-10-CM | POA: Diagnosis not present

## 2017-10-27 MED ORDER — DICLOFENAC SODIUM 1 % TD GEL: 2.0000 g | Freq: Four times a day (QID) | TRANSDERMAL | 3 refills | Status: DC

## 2017-10-27 MED ORDER — TRAMADOL HCL 50 MG PO TABS: 50.0000 mg | ORAL_TABLET | Freq: Three times a day (TID) | ORAL | 0 refills | Status: DC | PRN

## 2017-10-27 NOTE — Progress Notes (Signed)
Today's interpreter was [employer].

## 2017-10-28 NOTE — Progress Notes (Signed)
   Subjective:    Patient ID: Barbara Dennis, female    DOB: 28-Oct-1933, 84 y.o.   MRN: [id number]  HPI   Patient comes in today for follow-up on neck and low back pain. X-rays of her cervical spine show significant degenerative disc disease at C5-C6 with right neural foraminal encroachment. Previous x-rays of her lumbar spine showed multilevel degenerative disc disease, most pronounced at L2-L3, and multilevel facet arthropathy. Patient was given a trial of Neurontin as well as Voltaren gel at her last visit. Neurontin was not helpful but Voltaren gel has been helpful. She also has good success with 50 mg of tramadol every 8 hours as needed. She tolerates this medicine without difficulty. Overall, her symptoms have improved. Entire history is obtained with a help of an interpreter.    Review of Systems As above    Objective:   Physical Exam  Well-developed, well-nourished. No acute distress. Sitting In exam room  Patient has limited cervical and lumbar range of motion but limited pain. Her strength is equal in both upper extremities. Strength is equal in both lower extremities as well. Good pulses peripherally.  X-rays of her cervical spine and lumbar spine are as above      Assessment & Plan:   Chronic neck pain and arm pain secondary to cervical degenerative disc disease Chronic low back pain secondary to lumbar degenerative disc disease and facet arthropathy  Patient symptoms are stable on tramadol and Voltaren gel. I will refill both of those for her today. She understands that these are chronic conditions for which there is no real cure. Continue with activity as tolerated and follow-up with me as needed.

## 2017-11-21 ENCOUNTER — Other Ambulatory Visit: Payer: Self-pay | Admitting: Sports Medicine

## 2018-01-07 ENCOUNTER — Ambulatory Visit (INDEPENDENT_AMBULATORY_CARE_PROVIDER_SITE_OTHER): Payer: Medicare Other | Admitting: Emergency Medicine

## 2018-01-07 ENCOUNTER — Other Ambulatory Visit: Payer: Self-pay

## 2018-01-07 ENCOUNTER — Encounter: Payer: Self-pay | Admitting: Emergency Medicine

## 2018-01-07 VITALS — BP 150/72 | HR 85 | Temp 98.7°F | Resp 16 | Ht 59.0 in | Wt 98.2 lb

## 2018-01-07 DIAGNOSIS — N390 Urinary tract infection, site not specified: Secondary | ICD-10-CM

## 2018-01-07 DIAGNOSIS — R8290 Unspecified abnormal findings in urine: Secondary | ICD-10-CM

## 2018-01-07 DIAGNOSIS — M7918 Myalgia, other site: Secondary | ICD-10-CM

## 2018-01-07 DIAGNOSIS — G8929 Other chronic pain: Secondary | ICD-10-CM | POA: Diagnosis not present

## 2018-01-07 DIAGNOSIS — M79605 Pain in left leg: Secondary | ICD-10-CM

## 2018-01-07 DIAGNOSIS — R350 Frequency of micturition: Secondary | ICD-10-CM

## 2018-01-07 DIAGNOSIS — M79604 Pain in right leg: Secondary | ICD-10-CM | POA: Diagnosis not present

## 2018-01-07 DIAGNOSIS — M545 Low back pain, unspecified: Secondary | ICD-10-CM

## 2018-01-07 DIAGNOSIS — G894 Chronic pain syndrome: Secondary | ICD-10-CM | POA: Diagnosis not present

## 2018-01-07 LAB — POCT URINALYSIS DIP (MANUAL ENTRY)
BILIRUBIN UA: NEGATIVE mg/dL
Bilirubin, UA: NEGATIVE
Glucose, UA: NEGATIVE mg/dL
Nitrite, UA: POSITIVE — AB
PH UA: 7 (ref 5.0–8.0)
PROTEIN UA: NEGATIVE mg/dL
SPEC GRAV UA: 1.02 (ref 1.010–1.025)
Urobilinogen, UA: 0.2 E.U./dL

## 2018-01-07 MED ORDER — TRAMADOL HCL 50 MG PO TABS
50.0000 mg | ORAL_TABLET | Freq: Three times a day (TID) | ORAL | 0 refills | Status: DC | PRN
Start: 2018-01-07 — End: 2018-01-07

## 2018-01-07 MED ORDER — TRAMADOL HCL 50 MG PO TABS: 50.0000 mg | ORAL_TABLET | Freq: Three times a day (TID) | ORAL | 0 refills | Status: DC | PRN

## 2018-01-07 MED ORDER — SULFAMETHOXAZOLE-TRIMETHOPRIM 800-160 MG PO TABS: 1.0000 | ORAL_TABLET | Freq: Two times a day (BID) | ORAL | 0 refills | Status: AC

## 2018-01-07 NOTE — Progress Notes (Signed)
Barbara Dennis 84 y.o.   Chief Complaint  Patient presents with  . Generalized Body Aches    per patient shoulders, back, legs and feet pain started again 01/06/2018    HISTORY OF PRESENT ILLNESS: This is a 84 y.o. female with a history of chronic low back pain and chronic pain syndrome, on tramadol as needed, today complaining of urinary frequency mostly at night as well as foul smell to the urine.  No other significant symptoms.  History obtained with the help of interpreter.  SuJin #[id number].  HPI   Prior to Admission medications   Medication Sig Start Date End Date Taking? Authorizing Provider  acetaminophen (TYLENOL ARTHRITIS PAIN) 650 MG CR tablet Take 1 tablet (650 mg total) by mouth every 12 (twelve) hours. For joint pain 02/22/16  Yes Shawnee Knapp, MD  albuterol (PROVENTIL HFA;VENTOLIN HFA) 108 (90 Base) MCG/ACT inhaler Inhale 2 puffs into the lungs every 4 (four) hours as needed for wheezing or shortness of breath (cough, shortness of breath or wheezing.). 01/21/17  Yes English, Colletta Maryland D, PA  Calcium Carbonate Antacid (TUMS PO) Take by mouth.   Yes [provider]  diclofenac sodium (VOLTAREN) 1 % GEL Apply 2 g topically 4 (four) times daily. 10/27/17  Yes Draper, Christia Reading R, DO  gabapentin (NEURONTIN) 100 MG capsule Take 1 capsule (100 mg total) by mouth at bedtime. 09/23/17  Yes Draper, Christia Reading R, DO  lisinopril (PRINIVIL,ZESTRIL) 10 MG tablet TAKE 1 TABLET(10 MG) BY MOUTH DAILY 07/15/17  Yes Mandrell Vangilder, Ines Bloomer, MD  MELATONIN PO Take by mouth.   Yes [provider]  Misc Natural Products (OSTEO BI-FLEX TRIPLE STRENGTH PO) Take by mouth.   Yes [provider]  traMADol (ULTRAM) 50 MG tablet Take 1 tablet (50 mg total) by mouth every 8 (eight) hours as needed for severe pain. 10/27/17  Yes Draper, Christia Reading R, DO  diclofenac sodium (VOLTAREN) 1 % GEL APPLY 2 GRAMS TO THE AFFECTED AREA FOUR TIMES DAILY 11/24/17   Thurman Coyer, DO  fexofenadine (ALLEGRA) 30 MG  tablet Take 30 mg by mouth 2 (two) times daily.    [provider]  Glucos-Chond-Hyal Ac-Ca Fructo (MOVE FREE JOINT HEALTH ADVANCE) TABS Take 1 tablet by mouth 2 (two) times daily. Or as instructed by packaging Patient not taking: Reported on 01/07/2018 02/22/16   Shawnee Knapp, MD  meloxicam (MOBIC) 7.5 MG tablet Take 1 tablet (7.5 mg total) by mouth daily. Patient not taking: Reported on 01/07/2018 02/10/17   Ivar Drape D, PA  methocarbamol (ROBAXIN) 500 MG tablet TAKE 1 TABLET(500 MG) BY MOUTH AT BEDTIME AS NEEDED FOR MUSCLE SPASMS Patient not taking: Reported on 01/07/2018 04/18/17   Ivar Drape D, PA  omeprazole (PRILOSEC) 20 MG capsule Take 2 capsules (40 mg total) by mouth 2 (two) times daily. 30 minutes prior to breakfast Patient not taking: Reported on 01/07/2018 04/08/16   Shawnee Knapp, MD  polyethylene glycol powder (GLYCOLAX/MIRALAX) powder Take 17 g by mouth 2 (two) times daily as needed. Patient not taking: Reported on 01/07/2018 02/27/17   Ivar Drape D, PA  ranitidine (ZANTAC) 150 MG tablet Take 1 tablet (150 mg total) by mouth 2 (two) times daily as needed for heartburn (or indigestion). Patient not taking: Reported on 01/07/2018 02/22/16   Shawnee Knapp, MD  tiZANidine (ZANAFLEX) 2 MG tablet Take 1 tablet (2 mg total) by mouth at bedtime. Patient not taking: Reported on 01/07/2018 02/22/16   Shawnee Knapp, MD  varenicline (CHANTIX) 0.5 MG tablet Take 1 tablet (0.5 mg total) by mouth 2 (two) times daily. 08/20/17   Horald Pollen, MD    No Known Allergies  Patient Active Problem List   Diagnosis Date Noted  . Knee pain, bilateral 09/09/2017  . Chronic obstructive pulmonary disease ([place]) 08/20/2017  . Multinodular goiter 08/15/2017  . Musculoskeletal pain 07/15/2017  . Bilateral leg pain 07/15/2017  . Chronic bilateral low back pain without sciatica 07/15/2017  . Osteopenia of multiple sites 07/15/2017  . Essential hypertension, benign 07/15/2017  .  Hypercalcemia 07/15/2017  . Cough 06/16/2017  . Bad odor of urine 06/16/2017  . Atypical chest pain 06/16/2017  . Sciatica of left side 03/10/2017  . Depression with anxiety 01/06/2015  . Cervical adenopathy 03/31/2014  . 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 HTN, goal below 150/90 06/17/2013  . DJD (degenerative joint disease) 02/16/2013  . Tobacco user 09/23/2012  . Menopause 08/21/2012  . Postmenopausal atrophic vaginitis 08/21/2012    Past Medical History:  Diagnosis Date  . Hypertension   . Mass of right side of neck     Past Surgical History:  Procedure Laterality Date  . APPENDECTOMY      Social History   Socioeconomic History  . Marital status: Widowed    Spouse name: Not on file  . Number of children: Not on file  . Years of education: Not on file  . Highest education level: Not on file  Social Needs  . Financial resource strain: Not on file  . Food insecurity - worry: Not on file  . Food insecurity - inability: Not on file  . Transportation needs - medical: Not on file  . Transportation needs - non-medical: Not on file  Occupational History  . Not on file  Tobacco Use  . Smoking status: Current Every Day Smoker    Packs/day: 0.70    Years: 60.00    Pack years: 42.00    Types: Cigarettes  . Smokeless tobacco: Never Used  Substance and Sexual Activity  . Alcohol use: No    Alcohol/week: 0.0 oz  . Drug use: No  . Sexual activity: Not on file  Other Topics Concern  . Not on file  Social History Narrative   Marital status: widowed 28 years ago.  From [place]; moved to [place] 1974.     Children: none       Lives:  Alone; brother in law is Psychologist, sport and exercise who is 23.      Employment: retired.       Tobacco:  1/2 ppd       Alcohol:  None      Exercise:  Walking daily.      ADLs: no driving.    Family History  Problem Relation Age of Onset  . Hypercalcemia Neg Hx      Review of Systems  Constitutional: Negative.  Negative for chills, fever and weight  loss.  HENT: Negative.   Eyes: Negative.   Respiratory: Negative.  Negative for cough and shortness of breath.   Cardiovascular: Negative.   Gastrointestinal: Negative for abdominal pain, diarrhea, nausea and vomiting.  Genitourinary: Positive for dysuria, frequency and urgency. Negative for flank pain and hematuria.  Musculoskeletal: Positive for back pain and joint pain.  Skin: Negative for rash.  Neurological: Negative.  Negative for dizziness and headaches.  Endo/Heme/Allergies: Negative.   All other systems reviewed and are negative.  Vitals:   01/07/18 1430  BP: (!) 150/72  Pulse: 85  Resp: 16  Temp: 98.7 F (37.1 C)  SpO2: 98%     Physical Exam  Constitutional: She is oriented to person, place, and time. She appears well-developed and well-nourished.  HENT:  Head: Normocephalic and atraumatic.  Nose: Nose normal.  Mouth/Throat: Oropharynx is clear and moist.  Eyes: Conjunctivae and EOM are normal. Pupils are equal, round, and reactive to light.  Neck: Normal range of motion. Neck supple.  Cardiovascular: Normal rate, regular rhythm and normal heart sounds.  Pulmonary/Chest: Effort normal and breath sounds normal.  Abdominal: Soft. Bowel sounds are normal. She exhibits no distension. There is no tenderness.  Musculoskeletal: Normal range of motion.  Neurological: She is alert and oriented to person, place, and time. No sensory deficit. She exhibits normal muscle tone.  Skin: Skin is warm and dry. Capillary refill takes less than 2 seconds. No rash noted.  Psychiatric: She has a normal mood and affect. Her behavior is normal.  Vitals reviewed.  Results for orders placed or performed in visit on 01/07/18 (from the past 24 hour(s))  POCT urinalysis dipstick     Status: Abnormal   Collection Time: 01/07/18  3:15 PM  Result Value Ref Range   Color, UA yellow yellow   Clarity, UA cloudy (A) clear   Glucose, UA negative negative mg/dL   Bilirubin, UA negative negative     Ketones, POC UA negative negative mg/dL   Spec Grav, UA 1.020 1.010 - 1.025   Blood, UA trace-intact (A) negative   pH, UA 7.0 5.0 - 8.0   Protein Ur, POC negative negative mg/dL   Urobilinogen, UA 0.2 0.2 or 1.0 E.U./dL   Nitrite, UA Positive (A) Negative   Leukocytes, UA Trace (A) Negative     ASSESSMENT & PLAN: Kendra was seen today for generalized body aches.  Diagnoses and all orders for this visit:  Acute UTI -     sulfamethoxazole-trimethoprim (BACTRIM DS,SEPTRA DS) 800-160 MG tablet; Take 1 tablet by mouth 2 (two) times daily for 7 days.  Urinary frequency -     Urine Culture -     POCT urinalysis dipstick  Foul smelling urine -     Urine Culture -     POCT urinalysis dipstick  Chronic bilateral low back pain without sciatica -     Discontinue: traMADol (ULTRAM) 50 MG tablet; Take 1 tablet (50 mg total) by mouth every 8 (eight) hours as needed for severe pain. -     traMADol (ULTRAM) 50 MG tablet; Take 1 tablet (50 mg total) by mouth every 8 (eight) hours as needed for severe pain.  Chronic pain syndrome  Musculoskeletal pain -     Discontinue: traMADol (ULTRAM) 50 MG tablet; Take 1 tablet (50 mg total) by mouth every 8 (eight) hours as needed for severe pain. -     traMADol (ULTRAM) 50 MG tablet; Take 1 tablet (50 mg total) by mouth every 8 (eight) hours as needed for severe pain.  Bilateral leg pain -     Discontinue: traMADol (ULTRAM) 50 MG tablet; Take 1 tablet (50 mg total) by mouth every 8 (eight) hours as needed for severe pain. -     traMADol (ULTRAM) 50 MG tablet; Take 1 tablet (50 mg total) by mouth every 8 (eight) hours as needed for severe pain.    A total of 25 minutes was spent in the room with the patient, greater than 50% of which was in counseling/coordination of care.   Patient Instructions  IF you received an x-ray today, you will receive an invoice from [facility]. Please contact [facility] at [phone] with  questions or concerns regarding your invoice.   IF you received labwork today, you will receive an invoice from [facility]. Please contact [facility] at [phone] with questions or concerns regarding your invoice.   Our billing staff will not be able to assist you with questions regarding bills from these companies.  You will be contacted with the lab results as soon as they are available. The fastest way to get your results is to activate your My Chart account. Instructions are located on the last page of this paperwork. If you have not heard from [place] regarding the results in 2 weeks, please contact this office.     Urinary Tract Infection, Adult A urinary tract infection (UTI) is an infection of any part of the urinary tract. The urinary tract includes the:  Kidneys.  Ureters.  Bladder.  Urethra.  These organs make, store, and get rid of pee (urine) in the body. Follow these instructions at home:  Take over-the-counter and prescription medicines only as told by your doctor.  If you were prescribed an antibiotic medicine, take it as told by your doctor. Do not stop taking the antibiotic even if you start to feel better.  Avoid the following drinks: ? Alcohol. ? Caffeine. ? Tea. ? Carbonated drinks.  Drink enough fluid to keep your pee clear or pale yellow.  Keep all follow-up visits as told by your doctor. This is important.  Make sure to: ? Empty your bladder often and completely. Do not to hold pee for long periods of time. ? Empty your bladder before and after sex. ? Wipe from front to back after a bowel movement if you are female. Use each tissue one time when you wipe. Contact a doctor if:  You have back pain.  You have a fever.  You feel sick to your stomach (nauseous).  You throw up (vomit).  Your symptoms do not get better after 3 days.  Your symptoms go away and then come back. Get help right away if:  You have very bad back pain.  You have very bad  lower belly (abdominal) pain.  You are throwing up and cannot keep down any medicines or water. This information is not intended to replace advice given to you by your health care provider. Make sure you discuss any questions you have with your health care provider. Document Released: 03/25/2008 Document Revised: 03/14/2016 Document Reviewed: 08/28/2015 Elsevier Interactive Patient Education  2018 Elsevier Inc.    Agustina Caroli, MD [facility]

## 2018-01-07 NOTE — Patient Instructions (Addendum)
     IF you received an x-ray today, you will receive an invoice from [facility]. Please contact [facility] at [phone] with questions or concerns regarding your invoice.   IF you received labwork today, you will receive an invoice from [facility]. Please contact [facility] at [phone] with questions or concerns regarding your invoice.   Our billing staff will not be able to assist you with questions regarding bills from these companies.  You will be contacted with the lab results as soon as they are available. The fastest way to get your results is to activate your My Chart account. Instructions are located on the last page of this paperwork. If you have not heard from us regarding the results in 2 weeks, please contact this office.     Urinary Tract Infection, Adult A urinary tract infection (UTI) is an infection of any part of the urinary tract. The urinary tract includes the:  Kidneys.  Ureters.  Bladder.  Urethra.  These organs make, store, and get rid of pee (urine) in the body. Follow these instructions at home:  Take over-the-counter and prescription medicines only as told by your doctor.  If you were prescribed an antibiotic medicine, take it as told by your doctor. Do not stop taking the antibiotic even if you start to feel better.  Avoid the following drinks: ? Alcohol. ? Caffeine. ? Tea. ? Carbonated drinks.  Drink enough fluid to keep your pee clear or pale yellow.  Keep all follow-up visits as told by your doctor. This is important.  Make sure to: ? Empty your bladder often and completely. Do not to hold pee for long periods of time. ? Empty your bladder before and after sex. ? Wipe from front to back after a bowel movement if you are female. Use each tissue one time when you wipe. Contact a doctor if:  You have back pain.  You have a fever.  You feel sick to your stomach (nauseous).  You throw up (vomit).  Your symptoms do  not get better after 3 days.  Your symptoms go away and then come back. Get help right away if:  You have very bad back pain.  You have very bad lower belly (abdominal) pain.  You are throwing up and cannot keep down any medicines or water. This information is not intended to replace advice given to you by your health care provider. Make sure you discuss any questions you have with your health care provider. Document Released: 03/25/2008 Document Revised: 03/14/2016 Document Reviewed: 08/28/2015 Elsevier Interactive Patient Education  2018 Elsevier Inc.  

## 2018-01-10 LAB — URINE CULTURE

## 2018-01-13 ENCOUNTER — Encounter: Payer: Self-pay | Admitting: Radiology

## 2018-01-20 ENCOUNTER — Encounter: Payer: Self-pay | Admitting: Physician Assistant

## 2018-03-03 ENCOUNTER — Ambulatory Visit: Payer: Medicare Other | Admitting: Endocrinology

## 2018-03-03 ENCOUNTER — Encounter: Payer: Self-pay | Admitting: Endocrinology

## 2018-03-03 DIAGNOSIS — E042 Nontoxic multinodular goiter: Secondary | ICD-10-CM

## 2018-03-03 LAB — VITAMIN D 25 HYDROXY (VIT D DEFICIENCY, FRACTURES): VITD: 49.39 ng/mL (ref 30.00–100.00)

## 2018-03-03 LAB — T4, FREE: FREE T4: 1.02 ng/dL (ref 0.60–1.60)

## 2018-03-03 LAB — TSH: TSH: 1.02 u[IU]/mL (ref 0.35–4.50)

## 2018-03-03 NOTE — Progress Notes (Signed)
Subjective:    Patient ID: Barbara Dennis, female    DOB: Feb 08, 1934, 84 y.o.   MRN: [id number]  HPI Pt returns for f/u of hypercalcemia (dx'ed 2018 (it was normal in 2017); she has h/o low bone density on plain films of the lower back; she has no h/o urolithiasis or bony fracture; she prefers to receive results via letter).  pt states she feels well in general, except for moderate bilat knee pain, but no assoc numbness.  Hypercalcemia, uncertain etiology, resolved.  We'll follow.  She also has h/o suppressed TSH in 2018 (was normal on recheck; plan is to follow).  Phone translator is used today.  pt states she feels well in general, since recent UTI.   Past Medical History:  Diagnosis Date  . Hypertension   . Mass of right side of neck     Past Surgical History:  Procedure Laterality Date  . APPENDECTOMY      Social History   Socioeconomic History  . Marital status: Widowed    Spouse name: Not on file  . Number of children: Not on file  . Years of education: Not on file  . Highest education level: Not on file  Occupational History  . Not on file  Social Needs  . Financial resource strain: Not on file  . Food insecurity:    Worry: Not on file    Inability: Not on file  . Transportation needs:    Medical: Not on file    Non-medical: Not on file  Tobacco Use  . Smoking status: Current Every Day Smoker    Packs/day: 0.70    Years: 60.00    Pack years: 42.00    Types: Cigarettes  . Smokeless tobacco: Never Used  Substance and Sexual Activity  . Alcohol use: No    Alcohol/week: 0.0 oz  . Drug use: No  . Sexual activity: Not on file  Lifestyle  . Physical activity:    Days per week: Not on file    Minutes per session: Not on file  . Stress: Not on file  Relationships  . Social connections:    Talks on phone: Not on file    Gets together: Not on file    Attends religious service: Not on file    Active member of club or organization: Not on file    Attends  meetings of clubs or organizations: Not on file    Relationship status: Not on file  . Intimate partner violence:    Fear of current or ex partner: Not on file    Emotionally abused: Not on file    Physically abused: Not on file    Forced sexual activity: Not on file  Other Topics Concern  . Not on file  Social History Narrative   Marital status: widowed 28 years ago.  From [place]; moved to [place] 1974.     Children: none       Lives:  Alone; brother in law is Psychologist, sport and exercise who is 68.      Employment: retired.       Tobacco:  1/2 ppd       Alcohol:  None      Exercise:  Walking daily.      ADLs: no driving.    Current Outpatient Medications on File Prior to Visit  Medication Sig Dispense Refill  . acetaminophen (TYLENOL ARTHRITIS PAIN) 650 MG CR tablet Take 1 tablet (650 mg total) by mouth every 12 (twelve) hours. For joint pain  180 tablet 3  . albuterol (PROVENTIL HFA;VENTOLIN HFA) 108 (90 Base) MCG/ACT inhaler Inhale 2 puffs into the lungs every 4 (four) hours as needed for wheezing or shortness of breath (cough, shortness of breath or wheezing.). 1 Inhaler 1  . Calcium Carbonate Antacid (TUMS PO) Take by mouth.    . diclofenac sodium (VOLTAREN) 1 % GEL Apply 2 g topically 4 (four) times daily. 100 g 3  . fexofenadine (ALLEGRA) 30 MG tablet Take 30 mg by mouth 2 (two) times daily.    Marland Kitchen gabapentin (NEURONTIN) 100 MG capsule Take 1 capsule (100 mg total) by mouth at bedtime. 30 capsule 1  . Glucos-Chond-Hyal Ac-Ca Fructo (MOVE FREE JOINT HEALTH ADVANCE) TABS Take 1 tablet by mouth 2 (two) times daily. Or as instructed by packaging 60 tablet 3  . lisinopril (PRINIVIL,ZESTRIL) 10 MG tablet TAKE 1 TABLET(10 MG) BY MOUTH DAILY 90 tablet 3  . MELATONIN PO Take by mouth.    . meloxicam (MOBIC) 7.5 MG tablet Take 1 tablet (7.5 mg total) by mouth daily. (Patient not taking: Reported on 03/04/2018) 20 tablet 0  . methocarbamol (ROBAXIN) 500 MG tablet TAKE 1 TABLET(500 MG) BY MOUTH AT BEDTIME AS NEEDED  FOR MUSCLE SPASMS 30 tablet 0  . Misc Natural Products (OSTEO BI-FLEX TRIPLE STRENGTH PO) Take by mouth.    Marland Kitchen omeprazole (PRILOSEC) 20 MG capsule Take 2 capsules (40 mg total) by mouth 2 (two) times daily. 30 minutes prior to breakfast 60 capsule 1  . polyethylene glycol powder (GLYCOLAX/MIRALAX) powder Take 17 g by mouth 2 (two) times daily as needed. 289 g 1  . ranitidine (ZANTAC) 150 MG tablet Take 1 tablet (150 mg total) by mouth 2 (two) times daily as needed for heartburn (or indigestion). 180 tablet 3  . tiZANidine (ZANAFLEX) 2 MG tablet Take 1 tablet (2 mg total) by mouth at bedtime. 30 tablet 2  . varenicline (CHANTIX) 0.5 MG tablet Take 1 tablet (0.5 mg total) by mouth 2 (two) times daily. 60 tablet 1   No current facility-administered medications on file prior to visit.     No Known Allergies  Family History  Problem Relation Age of Onset  . Hypercalcemia Neg Hx     BP (!) 150/2 (BP Location: Left Arm, Patient Position: Sitting, Cuff Size: Normal)   Pulse 81   Ht 4\' 11"  (1.499 m)   Wt 98 lb 4 oz (44.6 kg)   SpO2 97%   BMI 19.84 kg/m    Review of Systems Denies numbness.      Objective:   Physical Exam VITAL SIGNS:  See vs page GENERAL: no distress.  NECK: Small multinoduar goiter Gait: steady with a cane   Lab Results  Component Value Date   TSH 1.02 03/03/2018   T4TOTAL 8.1 01/03/2015   Lab Results  Component Value Date   PTH 25 03/03/2018   CALCIUM 9.7 03/03/2018       Assessment & Plan:  Hypercalcemia: normal on recheck, but she is at risk for recurrence Suppressed TSH, usually due to small multinodular goiter: we'll follow Please come back for a follow-up appointment in 6 months

## 2018-03-03 NOTE — Patient Instructions (Addendum)
it is critically important to prevent falling down (keep floor areas well-lit, dry, and free of loose objects.  If you have a cane, walker, or wheelchair, you should use it, even for short trips around the house.  Wear flat-soled shoes.  Also, try not to rush).     blood tests are requested for you today.  We'll let you know about the results.   most of the time, a "lumpy thyroid" will eventually become overactive.  this is usually a slow process, happening over the span of many years.   Therefore, you should have your thyroid blood tests 1-2 times per year.   Please come back for a follow-up appointment in 6 months.

## 2018-03-04 ENCOUNTER — Ambulatory Visit: Payer: Medicare Other | Admitting: Emergency Medicine

## 2018-03-04 ENCOUNTER — Encounter: Payer: Self-pay | Admitting: Emergency Medicine

## 2018-03-04 ENCOUNTER — Other Ambulatory Visit: Payer: Self-pay

## 2018-03-04 VITALS — BP 140/62 | HR 72 | Temp 97.5°F | Resp 16 | Ht 59.0 in | Wt 99.4 lb

## 2018-03-04 DIAGNOSIS — M79605 Pain in left leg: Secondary | ICD-10-CM | POA: Diagnosis not present

## 2018-03-04 DIAGNOSIS — M545 Low back pain: Secondary | ICD-10-CM

## 2018-03-04 DIAGNOSIS — N390 Urinary tract infection, site not specified: Secondary | ICD-10-CM

## 2018-03-04 DIAGNOSIS — M7918 Myalgia, other site: Secondary | ICD-10-CM | POA: Diagnosis not present

## 2018-03-04 DIAGNOSIS — R350 Frequency of micturition: Secondary | ICD-10-CM | POA: Diagnosis not present

## 2018-03-04 DIAGNOSIS — G8929 Other chronic pain: Secondary | ICD-10-CM | POA: Diagnosis not present

## 2018-03-04 DIAGNOSIS — M79604 Pain in right leg: Secondary | ICD-10-CM | POA: Diagnosis not present

## 2018-03-04 DIAGNOSIS — G4700 Insomnia, unspecified: Secondary | ICD-10-CM

## 2018-03-04 LAB — POC MICROSCOPIC URINALYSIS (UMFC): Mucus: ABSENT

## 2018-03-04 LAB — POCT URINALYSIS DIP (MANUAL ENTRY)
BILIRUBIN UA: NEGATIVE mg/dL
Bilirubin, UA: NEGATIVE
Glucose, UA: NEGATIVE mg/dL
Nitrite, UA: POSITIVE — AB
PH UA: 6 (ref 5.0–8.0)
PROTEIN UA: NEGATIVE mg/dL
SPEC GRAV UA: 1.02 (ref 1.010–1.025)
UROBILINOGEN UA: 0.2 U/dL

## 2018-03-04 LAB — PTH, INTACT AND CALCIUM
Calcium: 9.7 mg/dL (ref 8.6–10.4)
PTH: 25 pg/mL (ref 14–64)

## 2018-03-04 MED ORDER — CEFADROXIL 500 MG PO CAPS: 500.0000 mg | ORAL_CAPSULE | Freq: Two times a day (BID) | ORAL | 0 refills | Status: DC

## 2018-03-04 MED ORDER — TRAMADOL HCL 50 MG PO TABS: 50.0000 mg | ORAL_TABLET | Freq: Three times a day (TID) | ORAL | 0 refills | Status: DC | PRN

## 2018-03-04 MED ORDER — CEFADROXIL 500 MG PO CAPS: 500.0000 mg | ORAL_CAPSULE | Freq: Two times a day (BID) | ORAL | 0 refills | Status: AC

## 2018-03-04 MED ORDER — TRAZODONE HCL 50 MG PO TABS: 25.0000 mg | ORAL_TABLET | Freq: Every evening | ORAL | 3 refills | Status: DC | PRN

## 2018-03-04 NOTE — Progress Notes (Signed)
Barbara Dennis 84 y.o.   Chief Complaint  Patient presents with  . Urinary Urgency    urinary freq, discomfort.  onset: x 10 days    HISTORY OF PRESENT ILLNESS: This is a 84 y.o. female complaining of UTI symptoms for 10 days.  Seen here on 01/07/2018 for the same.  Urine culture grew E. coli.  Patient took Bactrim DS for 7 days.  Felt better but symptoms came back this month.  Denies fever or chills.  Denies nausea or vomiting.  Denies flank pain.  History obtained through interpreter.  HPI   Prior to Admission medications   Medication Sig Start Date End Date Taking? Authorizing Provider  acetaminophen (TYLENOL ARTHRITIS PAIN) 650 MG CR tablet Take 1 tablet (650 mg total) by mouth every 12 (twelve) hours. For joint pain 02/22/16  Yes Shawnee Knapp, MD  albuterol (PROVENTIL HFA;VENTOLIN HFA) 108 (90 Base) MCG/ACT inhaler Inhale 2 puffs into the lungs every 4 (four) hours as needed for wheezing or shortness of breath (cough, shortness of breath or wheezing.). 01/21/17  Yes English, Colletta Maryland D, PA  Calcium Carbonate Antacid (TUMS PO) Take by mouth.   Yes [provider]  diclofenac sodium (VOLTAREN) 1 % GEL Apply 2 g topically 4 (four) times daily. 10/27/17  Yes Draper, Carlos Levering, DO  fexofenadine (ALLEGRA) 30 MG tablet Take 30 mg by mouth 2 (two) times daily.   Yes [provider]  gabapentin (NEURONTIN) 100 MG capsule Take 1 capsule (100 mg total) by mouth at bedtime. 09/23/17  Yes Draper, Carlos Levering, DO  Glucos-Chond-Hyal Ac-Ca Fructo (MOVE FREE JOINT HEALTH ADVANCE) TABS Take 1 tablet by mouth 2 (two) times daily. Or as instructed by packaging 02/22/16  Yes Shawnee Knapp, MD  lisinopril (PRINIVIL,ZESTRIL) 10 MG tablet TAKE 1 TABLET(10 MG) BY MOUTH DAILY 07/15/17  Yes Ruth Kovich, Ines Bloomer, MD  MELATONIN PO Take by mouth.   Yes [provider]  methocarbamol (ROBAXIN) 500 MG tablet TAKE 1 TABLET(500 MG) BY MOUTH AT BEDTIME AS NEEDED FOR MUSCLE SPASMS 04/18/17  Yes English,  [place] D, PA  Misc Natural Products (OSTEO BI-FLEX TRIPLE STRENGTH PO) Take by mouth.   Yes [provider]  omeprazole (PRILOSEC) 20 MG capsule Take 2 capsules (40 mg total) by mouth 2 (two) times daily. 30 minutes prior to breakfast 04/08/16  Yes Shawnee Knapp, MD  polyethylene glycol powder (GLYCOLAX/MIRALAX) powder Take 17 g by mouth 2 (two) times daily as needed. 02/27/17  Yes English, Colletta Maryland D, PA  ranitidine (ZANTAC) 150 MG tablet Take 1 tablet (150 mg total) by mouth 2 (two) times daily as needed for heartburn (or indigestion). 02/22/16  Yes Shawnee Knapp, MD  tiZANidine (ZANAFLEX) 2 MG tablet Take 1 tablet (2 mg total) by mouth at bedtime. 02/22/16  Yes Shawnee Knapp, MD  traMADol (ULTRAM) 50 MG tablet Take 1 tablet (50 mg total) by mouth every 8 (eight) hours as needed for severe pain. 01/07/18  Yes Lorelei Heikkila, Ines Bloomer, MD  cefadroxil (DURICEF) 500 MG capsule Take 1 capsule (500 mg total) by mouth 2 (two) times daily for 7 days. 03/04/18 03/11/18  Horald Pollen, MD  meloxicam (MOBIC) 7.5 MG tablet Take 1 tablet (7.5 mg total) by mouth daily. Patient not taking: Reported on 03/04/2018 02/10/17   Ivar Drape D, PA  varenicline (CHANTIX) 0.5 MG tablet Take 1 tablet (0.5 mg total) by mouth 2 (two) times daily. 08/20/17   Horald Pollen, MD  No Known Allergies  Patient Active Problem List   Diagnosis Date Noted  . Acute UTI 01/07/2018  . Chronic pain syndrome 01/07/2018  . Knee pain, bilateral 09/09/2017  . Chronic obstructive pulmonary disease ([place]) 08/20/2017  . Multinodular goiter 08/15/2017  . Musculoskeletal pain 07/15/2017  . Bilateral leg pain 07/15/2017  . Chronic bilateral low back pain without sciatica 07/15/2017  . Osteopenia of multiple sites 07/15/2017  . Essential hypertension, benign 07/15/2017  . Hypercalcemia 07/15/2017  . Cough 06/16/2017  . Bad odor of urine 06/16/2017  . Atypical chest pain 06/16/2017  . Sciatica of left side 03/10/2017    . Depression with anxiety 01/06/2015  . Cervical adenopathy 03/31/2014  . 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 HTN, goal below 150/90 06/17/2013  . DJD (degenerative joint disease) 02/16/2013  . Tobacco user 09/23/2012  . Menopause 08/21/2012  . Postmenopausal atrophic vaginitis 08/21/2012    Past Medical History:  Diagnosis Date  . Hypertension   . Mass of right side of neck     Past Surgical History:  Procedure Laterality Date  . APPENDECTOMY      Social History   Socioeconomic History  . Marital status: Widowed    Spouse name: Not on file  . Number of children: Not on file  . Years of education: Not on file  . Highest education level: Not on file  Occupational History  . Not on file  Social Needs  . Financial resource strain: Not on file  . Food insecurity:    Worry: Not on file    Inability: Not on file  . Transportation needs:    Medical: Not on file    Non-medical: Not on file  Tobacco Use  . Smoking status: Current Every Day Smoker    Packs/day: 0.70    Years: 60.00    Pack years: 42.00    Types: Cigarettes  . Smokeless tobacco: Never Used  Substance and Sexual Activity  . Alcohol use: No    Alcohol/week: 0.0 oz  . Drug use: No  . Sexual activity: Not on file  Lifestyle  . Physical activity:    Days per week: Not on file    Minutes per session: Not on file  . Stress: Not on file  Relationships  . Social connections:    Talks on phone: Not on file    Gets together: Not on file    Attends religious service: Not on file    Active member of club or organization: Not on file    Attends meetings of clubs or organizations: Not on file    Relationship status: Not on file  . Intimate partner violence:    Fear of current or ex partner: Not on file    Emotionally abused: Not on file    Physically abused: Not on file    Forced sexual activity: Not on file  Other Topics Concern  . Not on file  Social History Narrative   Marital status:  widowed 28 years ago.  From [place]; moved to [place] 1974.     Children: none       Lives:  Alone; brother in law is Psychologist, sport and exercise who is 16.      Employment: retired.       Tobacco:  1/2 ppd       Alcohol:  None      Exercise:  Walking daily.      ADLs: no driving.    Family History  Problem Relation Age of Onset  .  Hypercalcemia Neg Hx      Review of Systems  Constitutional: Negative.  Negative for chills, fever and malaise/fatigue.  HENT: Negative.  Negative for sore throat.   Eyes: Negative.  Negative for discharge and redness.  Respiratory: Negative.  Negative for cough, hemoptysis and shortness of breath.   Cardiovascular: Negative.  Negative for chest pain and palpitations.  Gastrointestinal: Negative.  Negative for abdominal pain, blood in stool, diarrhea, melena, nausea and vomiting.  Genitourinary: Positive for dysuria, frequency and urgency. Negative for flank pain and hematuria.  Skin: Negative.  Negative for rash.  Neurological: Negative for dizziness, loss of consciousness and headaches.  All other systems reviewed and are negative.  Vitals:   03/04/18 1128  BP: 140/62  Pulse: 72  Resp: 16  Temp: (!) 97.5 F (36.4 C)  SpO2: 97%     Physical Exam  Constitutional: She is oriented to person, place, and time. She appears well-developed and well-nourished.  HENT:  Head: Normocephalic and atraumatic.  Eyes: Pupils are equal, round, and reactive to light. EOM are normal.  Neck: Normal range of motion. Neck supple.  Cardiovascular: Normal rate, regular rhythm and normal heart sounds.  Pulmonary/Chest: Effort normal and breath sounds normal.  Abdominal: Soft. She exhibits no distension. There is no tenderness.  Musculoskeletal: Normal range of motion. She exhibits no edema or tenderness.  Neurological: She is alert and oriented to person, place, and time. No sensory deficit. She exhibits normal muscle tone.  Skin: Skin is warm and dry. Capillary refill takes less than 2  seconds. No rash noted.  Psychiatric: She has a normal mood and affect. Her behavior is normal.  Vitals reviewed.  Results for orders placed or performed in visit on 03/04/18 (from the past 24 hour(s))  POCT urinalysis dipstick     Status: Abnormal   Collection Time: 03/04/18 11:23 AM  Result Value Ref Range   Color, UA yellow yellow   Clarity, UA cloudy (A) clear   Glucose, UA negative negative mg/dL   Bilirubin, UA negative negative   Ketones, POC UA negative negative mg/dL   Spec Grav, UA 1.020 1.010 - 1.025   Blood, UA trace-lysed (A) negative   pH, UA 6.0 5.0 - 8.0   Protein Ur, POC negative negative mg/dL   Urobilinogen, UA 0.2 0.2 or 1.0 E.U./dL   Nitrite, UA Positive (A) Negative   Leukocytes, UA Trace (A) Negative  POCT Microscopic Urinalysis ([facility])     Status: Abnormal   Collection Time: 03/04/18 11:29 AM  Result Value Ref Range   WBC,UR,HPF,POC Moderate (A) None WBC/hpf   RBC,UR,HPF,POC None None RBC/hpf   Bacteria Many (A) None, Too numerous to count   Mucus Absent Absent   Epithelial Cells, UR Per Microscopy None None, Too numerous to count cells/hpf   A total of 25 minutes was spent in the room with the patient, greater than 50% of which was in counseling/coordination of care regarding diagnosis, treatment, antibiotics, and need for follow-up.   ASSESSMENT & PLAN:  Barbara Dennis was seen today for urinary urgency.  Diagnoses and all orders for this visit:  Acute UTI -     Discontinue: cefadroxil (DURICEF) 500 MG capsule; Take 1 capsule (500 mg total) by mouth 2 (two) times daily for 7 days. -     cefadroxil (DURICEF) 500 MG capsule; Take 1 capsule (500 mg total) by mouth 2 (two) times daily for 7 days.  Urinary frequency -     POCT urinalysis dipstick -  POCT Microscopic Urinalysis ([facility]) -     Urine Culture  Musculoskeletal pain -     traMADol (ULTRAM) 50 MG tablet; Take 1 tablet (50 mg total) by mouth every 8 (eight) hours as needed for severe  pain.  Bilateral leg pain -     traMADol (ULTRAM) 50 MG tablet; Take 1 tablet (50 mg total) by mouth every 8 (eight) hours as needed for severe pain.  Chronic bilateral low back pain without sciatica -     traMADol (ULTRAM) 50 MG tablet; Take 1 tablet (50 mg total) by mouth every 8 (eight) hours as needed for severe pain.  Insomnia, unspecified type -     traZODone (DESYREL) 50 MG tablet; Take 0.5-1 tablets (25-50 mg total) by mouth at bedtime as needed for sleep.    Patient Instructions   Urinary Tract Infection, Adult A urinary tract infection (UTI) is an infection of any part of the urinary tract. The urinary tract includes the:  Kidneys.  Ureters.  Bladder.  Urethra.  These organs make, store, and get rid of pee (urine) in the body. Follow these instructions at home:  Take over-the-counter and prescription medicines only as told by your doctor.  If you were prescribed an antibiotic medicine, take it as told by your doctor. Do not stop taking the antibiotic even if you start to feel better.  Avoid the following drinks: ? Alcohol. ? Caffeine. ? Tea. ? Carbonated drinks.  Drink enough fluid to keep your pee clear or pale yellow.  Keep all follow-up visits as told by your doctor. This is important.  Make sure to: ? Empty your bladder often and completely. Do not to hold pee for long periods of time. ? Empty your bladder before and after sex. ? Wipe from front to back after a bowel movement if you are female. Use each tissue one time when you wipe. Contact a doctor if:  You have back pain.  You have a fever.  You feel sick to your stomach (nauseous).  You throw up (vomit).  Your symptoms do not get better after 3 days.  Your symptoms go away and then come back. Get help right away if:  You have very bad back pain.  You have very bad lower belly (abdominal) pain.  You are throwing up and cannot keep down any medicines or water. This information is not  intended to replace advice given to you by your health care provider. Make sure you discuss any questions you have with your health care provider. Document Released: 03/25/2008 Document Revised: 03/14/2016 Document Reviewed: 08/28/2015 Elsevier Interactive Patient Education  2018 [employer].     IF you received an x-ray today, you will receive an invoice from [facility]. Please contact [facility] at [phone] with questions or concerns regarding your invoice.   IF you received labwork today, you will receive an invoice from [place]. Please contact [facility] at [phone] with questions or concerns regarding your invoice.   Our billing staff will not be able to assist you with questions regarding bills from these companies.  You will be contacted with the lab results as soon as they are available. The fastest way to get your results is to activate your My Chart account. Instructions are located on the last page of this paperwork. If you have not heard from [place] regarding the results in 2 weeks, please contact this office.      Urinary Tract Infection, Adult A urinary tract infection (UTI) is an infection  of any part of the urinary tract. The urinary tract includes the:  Kidneys.  Ureters.  Bladder.  Urethra.  These organs make, store, and get rid of pee (urine) in the body. Follow these instructions at home:  Take over-the-counter and prescription medicines only as told by your doctor.  If you were prescribed an antibiotic medicine, take it as told by your doctor. Do not stop taking the antibiotic even if you start to feel better.  Avoid the following drinks: ? Alcohol. ? Caffeine. ? Tea. ? Carbonated drinks.  Drink enough fluid to keep your pee clear or pale yellow.  Keep all follow-up visits as told by your doctor. This is important.  Make sure to: ? Empty your bladder often and completely. Do not to hold pee for long periods of time. ? Empty  your bladder before and after sex. ? Wipe from front to back after a bowel movement if you are female. Use each tissue one time when you wipe. Contact a doctor if:  You have back pain.  You have a fever.  You feel sick to your stomach (nauseous).  You throw up (vomit).  Your symptoms do not get better after 3 days.  Your symptoms go away and then come back. Get help right away if:  You have very bad back pain.  You have very bad lower belly (abdominal) pain.  You are throwing up and cannot keep down any medicines or water. This information is not intended to replace advice given to you by your health care provider. Make sure you discuss any questions you have with your health care provider. Document Released: 03/25/2008 Document Revised: 03/14/2016 Document Reviewed: 08/28/2015 Elsevier Interactive Patient Education  2018 [employer] Inc.     Agustina Caroli, MD [facility]

## 2018-03-04 NOTE — Patient Instructions (Addendum)
Urinary Tract Infection, Adult A urinary tract infection (UTI) is an infection of any part of the urinary tract. The urinary tract includes the:  Kidneys.  Ureters.  Bladder.  Urethra.  These organs make, store, and get rid of pee (urine) in the body. Follow these instructions at home:  Take over-the-counter and prescription medicines only as told by your doctor.  If you were prescribed an antibiotic medicine, take it as told by your doctor. Do not stop taking the antibiotic even if you start to feel better.  Avoid the following drinks: ? Alcohol. ? Caffeine. ? Tea. ? Carbonated drinks.  Drink enough fluid to keep your pee clear or pale yellow.  Keep all follow-up visits as told by your doctor. This is important.  Make sure to: ? Empty your bladder often and completely. Do not to hold pee for long periods of time. ? Empty your bladder before and after sex. ? Wipe from front to back after a bowel movement if you are female. Use each tissue one time when you wipe. Contact a doctor if:  You have back pain.  You have a fever.  You feel sick to your stomach (nauseous).  You throw up (vomit).  Your symptoms do not get better after 3 days.  Your symptoms go away and then come back. Get help right away if:  You have very bad back pain.  You have very bad lower belly (abdominal) pain.  You are throwing up and cannot keep down any medicines or water. This information is not intended to replace advice given to you by your health care provider. Make sure you discuss any questions you have with your health care provider. Document Released: 03/25/2008 Document Revised: 03/14/2016 Document Reviewed: 08/28/2015 Elsevier Interactive Patient Education  2018 [employer].     IF you received an x-ray today, you will receive an invoice from [facility]. Please contact [facility] at [phone] with questions or concerns regarding your invoice.   IF you  received labwork today, you will receive an invoice from [place]. Please contact [facility] at [phone] with questions or concerns regarding your invoice.   Our billing staff will not be able to assist you with questions regarding bills from these companies.  You will be contacted with the lab results as soon as they are available. The fastest way to get your results is to activate your My Chart account. Instructions are located on the last page of this paperwork. If you have not heard from [place] regarding the results in 2 weeks, please contact this office.      Urinary Tract Infection, Adult A urinary tract infection (UTI) is an infection of any part of the urinary tract. The urinary tract includes the:  Kidneys.  Ureters.  Bladder.  Urethra.  These organs make, store, and get rid of pee (urine) in the body. Follow these instructions at home:  Take over-the-counter and prescription medicines only as told by your doctor.  If you were prescribed an antibiotic medicine, take it as told by your doctor. Do not stop taking the antibiotic even if you start to feel better.  Avoid the following drinks: ? Alcohol. ? Caffeine. ? Tea. ? Carbonated drinks.  Drink enough fluid to keep your pee clear or pale yellow.  Keep all follow-up visits as told by your doctor. This is important.  Make sure to: ? Empty your bladder often and completely. Do not to hold pee for long periods of time. ? Empty your bladder  before and after sex. ? Wipe from front to back after a bowel movement if you are female. Use each tissue one time when you wipe. Contact a doctor if:  You have back pain.  You have a fever.  You feel sick to your stomach (nauseous).  You throw up (vomit).  Your symptoms do not get better after 3 days.  Your symptoms go away and then come back. Get help right away if:  You have very bad back pain.  You have very bad lower belly (abdominal) pain.  You are throwing up  and cannot keep down any medicines or water. This information is not intended to replace advice given to you by your health care provider. Make sure you discuss any questions you have with your health care provider. Document Released: 03/25/2008 Document Revised: 03/14/2016 Document Reviewed: 08/28/2015 Elsevier Interactive Patient Education  [employer].

## 2018-03-04 NOTE — Progress Notes (Signed)
pct

## 2018-03-05 ENCOUNTER — Encounter: Payer: Self-pay | Admitting: Endocrinology

## 2018-03-07 LAB — URINE CULTURE

## 2018-03-09 ENCOUNTER — Ambulatory Visit: Payer: Medicare Other | Admitting: Endocrinology

## 2018-03-10 ENCOUNTER — Encounter: Payer: Self-pay | Admitting: *Deleted

## 2018-03-13 ENCOUNTER — Ambulatory Visit (INDEPENDENT_AMBULATORY_CARE_PROVIDER_SITE_OTHER): Payer: Medicare Other | Admitting: Emergency Medicine

## 2018-03-13 ENCOUNTER — Other Ambulatory Visit: Payer: Self-pay

## 2018-03-13 ENCOUNTER — Encounter: Payer: Self-pay | Admitting: Emergency Medicine

## 2018-03-13 VITALS — BP 120/60 | HR 96 | Temp 98.1°F | Resp 20 | Ht 58.82 in | Wt 96.4 lb

## 2018-03-13 DIAGNOSIS — N342 Other urethritis: Secondary | ICD-10-CM | POA: Diagnosis not present

## 2018-03-13 LAB — CBC WITH DIFFERENTIAL/PLATELET
BASOS: 0 %
Basophils Absolute: 0 10*3/uL (ref 0.0–0.2)
EOS (ABSOLUTE): 0.1 10*3/uL (ref 0.0–0.4)
EOS: 2 %
HEMATOCRIT: 34 % (ref 34.0–46.6)
Hemoglobin: 11.1 g/dL (ref 11.1–15.9)
IMMATURE GRANS (ABS): 0 10*3/uL (ref 0.0–0.1)
Immature Granulocytes: 0 %
LYMPHS: 27 %
Lymphocytes Absolute: 1.9 10*3/uL (ref 0.7–3.1)
MCH: 31.6 pg (ref 26.6–33.0)
MCHC: 32.6 g/dL (ref 31.5–35.7)
MCV: 97 fL (ref 79–97)
MONOCYTES: 8 %
Monocytes Absolute: 0.6 10*3/uL (ref 0.1–0.9)
NEUTROS PCT: 63 %
Neutrophils Absolute: 4.4 10*3/uL (ref 1.4–7.0)
Platelets: 264 10*3/uL (ref 150–450)
RBC: 3.51 x10E6/uL — ABNORMAL LOW (ref 3.77–5.28)
RDW: 14.6 % (ref 12.3–15.4)
WBC: 7.1 10*3/uL (ref 3.4–10.8)

## 2018-03-13 LAB — POCT URINALYSIS DIP (MANUAL ENTRY)
Bilirubin, UA: NEGATIVE
GLUCOSE UA: NEGATIVE mg/dL
Ketones, POC UA: NEGATIVE mg/dL
Nitrite, UA: NEGATIVE
PH UA: 6.5 (ref 5.0–8.0)
PROTEIN UA: NEGATIVE mg/dL
RBC UA: NEGATIVE
SPEC GRAV UA: 1.02 (ref 1.010–1.025)
UROBILINOGEN UA: 0.2 U/dL

## 2018-03-13 LAB — COMPREHENSIVE METABOLIC PANEL
ALT: 15 IU/L (ref 0–32)
AST: 16 IU/L (ref 0–40)
Albumin/Globulin Ratio: 1.4 (ref 1.2–2.2)
Albumin: 4.4 g/dL (ref 3.5–4.7)
Alkaline Phosphatase: 61 IU/L (ref 39–117)
BUN/Creatinine Ratio: 16 (ref 12–28)
BUN: 16 mg/dL (ref 8–27)
Bilirubin Total: 0.3 mg/dL (ref 0.0–1.2)
CALCIUM: 9.9 mg/dL (ref 8.7–10.3)
CO2: 22 mmol/L (ref 20–29)
CREATININE: 0.99 mg/dL (ref 0.57–1.00)
Chloride: 97 mmol/L (ref 96–106)
GFR calc Af Amer: 61 mL/min/{1.73_m2} (ref >59)
GFR calc non Af Amer: 53 mL/min/{1.73_m2} — ABNORMAL LOW (ref >59)
Globulin, Total: 3.1 g/dL (ref 1.5–4.5)
Glucose: 84 mg/dL (ref 65–99)
Potassium: 4.6 mmol/L (ref 3.5–5.2)
Sodium: 133 mmol/L — ABNORMAL LOW (ref 134–144)
Total Protein: 7.5 g/dL (ref 6.0–8.5)

## 2018-03-13 NOTE — Progress Notes (Signed)
Barbara Dennis 84 y.o.   Chief Complaint  Patient presents with  . Medication Problem    cefadroxil  . Depression    screening done    HISTORY OF PRESENT ILLNESS: This is a 84 y.o. female here for follow-up of a UTI.  Seen by me on 03/04/2018.  Urine culture grew E. coli.  Was started on Duricef 500 mg twice a day.  Doing better.  Has 1 pill left to take.  Denies fever or chills.  No more urinary symptoms.  Antibiotic makes her feel a little nauseous.  Denies diarrhea.  History obtained through interpreter.  HPI   Prior to Admission medications   Medication Sig Start Date End Date Taking? Authorizing Provider  acetaminophen (TYLENOL ARTHRITIS PAIN) 650 MG CR tablet Take 1 tablet (650 mg total) by mouth every 12 (twelve) hours. For joint pain 02/22/16  Yes Shawnee Knapp, MD  albuterol (PROVENTIL HFA;VENTOLIN HFA) 108 (90 Base) MCG/ACT inhaler Inhale 2 puffs into the lungs every 4 (four) hours as needed for wheezing or shortness of breath (cough, shortness of breath or wheezing.). 01/21/17  Yes English, Colletta Maryland D, PA  Calcium Carbonate Antacid (TUMS PO) Take by mouth.   Yes [provider]  lisinopril (PRINIVIL,ZESTRIL) 10 MG tablet TAKE 1 TABLET(10 MG) BY MOUTH DAILY 07/15/17  Yes Kaori Jumper, Ines Bloomer, MD  traMADol (ULTRAM) 50 MG tablet Take 1 tablet (50 mg total) by mouth every 8 (eight) hours as needed for severe pain. 03/04/18  Yes Felisha Claytor, Ines Bloomer, MD  traZODone (DESYREL) 50 MG tablet Take 0.5-1 tablets (25-50 mg total) by mouth at bedtime as needed for sleep. 03/04/18  Yes Lannie Yusuf, Ines Bloomer, MD  diclofenac sodium (VOLTAREN) 1 % GEL Apply 2 g topically 4 (four) times daily. Patient not taking: Reported on 03/13/2018 10/27/17   Thurman Coyer, DO  fexofenadine (ALLEGRA) 30 MG tablet Take 30 mg by mouth 2 (two) times daily.    [provider]  gabapentin (NEURONTIN) 100 MG capsule Take 1 capsule (100 mg total) by mouth at bedtime. 09/23/17   Thurman Coyer, DO    Glucos-Chond-Hyal Ac-Ca Fructo (MOVE FREE JOINT HEALTH ADVANCE) TABS Take 1 tablet by mouth 2 (two) times daily. Or as instructed by packaging 02/22/16   Shawnee Knapp, MD  MELATONIN PO Take by mouth.    [provider]  meloxicam (MOBIC) 7.5 MG tablet Take 1 tablet (7.5 mg total) by mouth daily. Patient not taking: Reported on 03/04/2018 02/10/17   Ivar Drape D, PA  methocarbamol (ROBAXIN) 500 MG tablet TAKE 1 TABLET(500 MG) BY MOUTH AT BEDTIME AS NEEDED FOR MUSCLE SPASMS 04/18/17   English, Colletta Maryland D, PA  Misc Natural Products (OSTEO BI-FLEX TRIPLE STRENGTH PO) Take by mouth.    [provider]  omeprazole (PRILOSEC) 20 MG capsule Take 2 capsules (40 mg total) by mouth 2 (two) times daily. 30 minutes prior to breakfast 04/08/16   Shawnee Knapp, MD  polyethylene glycol powder (GLYCOLAX/MIRALAX) powder Take 17 g by mouth 2 (two) times daily as needed. 02/27/17   Joretta Bachelor, PA  ranitidine (ZANTAC) 150 MG tablet Take 1 tablet (150 mg total) by mouth 2 (two) times daily as needed for heartburn (or indigestion). 02/22/16   Shawnee Knapp, MD  tiZANidine (ZANAFLEX) 2 MG tablet Take 1 tablet (2 mg total) by mouth at bedtime. 02/22/16   Shawnee Knapp, MD  varenicline (CHANTIX) 0.5 MG tablet Take 1 tablet (0.5 mg total) by mouth  2 (two) times daily. 08/20/17   Horald Pollen, MD    No Known Allergies  Patient Active Problem List   Diagnosis Date Noted  . Acute UTI 01/07/2018  . Chronic pain syndrome 01/07/2018  . Knee pain, bilateral 09/09/2017  . Chronic obstructive pulmonary disease ([place]) 08/20/2017  . Multinodular goiter 08/15/2017  . Musculoskeletal pain 07/15/2017  . Bilateral leg pain 07/15/2017  . Chronic bilateral low back pain without sciatica 07/15/2017  . Osteopenia of multiple sites 07/15/2017  . Essential hypertension, benign 07/15/2017  . Hypercalcemia 07/15/2017  . Cough 06/16/2017  . Bad odor of urine 06/16/2017  . Atypical chest pain 06/16/2017  .  Sciatica of left side 03/10/2017  . Depression with anxiety 01/06/2015  . Cervical adenopathy 03/31/2014  . 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 HTN, goal below 150/90 06/17/2013  . DJD (degenerative joint disease) 02/16/2013  . Tobacco user 09/23/2012  . Menopause 08/21/2012  . Postmenopausal atrophic vaginitis 08/21/2012    Past Medical History:  Diagnosis Date  . Hypertension   . Mass of right side of neck     Past Surgical History:  Procedure Laterality Date  . APPENDECTOMY      Social History   Socioeconomic History  . Marital status: Widowed    Spouse name: Not on file  . Number of children: Not on file  . Years of education: Not on file  . Highest education level: Not on file  Occupational History  . Not on file  Social Needs  . Financial resource strain: Not on file  . Food insecurity:    Worry: Not on file    Inability: Not on file  . Transportation needs:    Medical: Not on file    Non-medical: Not on file  Tobacco Use  . Smoking status: Current Every Day Smoker    Packs/day: 0.70    Years: 60.00    Pack years: 42.00    Types: Cigarettes  . Smokeless tobacco: Never Used  Substance and Sexual Activity  . Alcohol use: No    Alcohol/week: 0.0 oz  . Drug use: No  . Sexual activity: Not on file  Lifestyle  . Physical activity:    Days per week: Not on file    Minutes per session: Not on file  . Stress: Not on file  Relationships  . Social connections:    Talks on phone: Not on file    Gets together: Not on file    Attends religious service: Not on file    Active member of club or organization: Not on file    Attends meetings of clubs or organizations: Not on file    Relationship status: Not on file  . Intimate partner violence:    Fear of current or ex partner: Not on file    Emotionally abused: Not on file    Physically abused: Not on file    Forced sexual activity: Not on file  Other Topics Concern  . Not on file  Social History  Narrative   Marital status: widowed 28 years ago.  From [place]; moved to [place] 1974.     Children: none       Lives:  Alone; brother in law is Psychologist, sport and exercise who is 89.      Employment: retired.       Tobacco:  1/2 ppd       Alcohol:  None      Exercise:  Walking daily.      ADLs:  no driving.    Family History  Problem Relation Age of Onset  . Hypercalcemia Neg Hx      Review of Systems  Constitutional: Negative.  Negative for chills and fever.  HENT: Negative for sore throat.   Respiratory: Negative.  Negative for cough and shortness of breath.   Cardiovascular: Negative.  Negative for chest pain and palpitations.  Gastrointestinal: Negative.  Negative for abdominal pain, diarrhea, nausea and vomiting.  Genitourinary: Negative.  Negative for dysuria and hematuria.  Musculoskeletal: Negative for back pain, myalgias and neck pain.  Skin: Negative.  Negative for rash.  Neurological: Negative for dizziness and headaches.  Endo/Heme/Allergies: Negative.   All other systems reviewed and are negative.    Physical Exam  Constitutional: She is oriented to person, place, and time. She appears well-developed and well-nourished.  HENT:  Head: Normocephalic and atraumatic.  Nose: Nose normal.  Mouth/Throat: Oropharynx is clear and moist.  Eyes: Pupils are equal, round, and reactive to light. Conjunctivae and EOM are normal.  Neck: Normal range of motion. Neck supple.  Cardiovascular: Normal rate and regular rhythm.  Pulmonary/Chest: Effort normal and breath sounds normal.  Abdominal: Soft. Bowel sounds are normal. She exhibits no distension. There is no tenderness.  Musculoskeletal: Normal range of motion. She exhibits no edema.  Neurological: She is alert and oriented to person, place, and time.  Skin: Skin is warm and dry. Capillary refill takes less than 2 seconds.  Psychiatric: She has a normal mood and affect. Her behavior is normal.  Vitals reviewed.  Results for orders placed or  performed in visit on 03/13/18 (from the past 24 hour(s))  POCT urinalysis dipstick     Status: Abnormal   Collection Time: 03/13/18  9:14 AM  Result Value Ref Range   Color, UA yellow yellow   Clarity, UA cloudy (A) clear   Glucose, UA negative negative mg/dL   Bilirubin, UA negative negative   Ketones, POC UA negative negative mg/dL   Spec Grav, UA 1.020 1.010 - 1.025   Blood, UA negative negative   pH, UA 6.5 5.0 - 8.0   Protein Ur, POC negative negative mg/dL   Urobilinogen, UA 0.2 0.2 or 1.0 E.U./dL   Nitrite, UA Negative Negative   Leukocytes, UA Small (1+) (A) Negative   A total of 25 minutes was spent in the room with the patient, family, and interpreter, greater than 50% of which was in counseling/coordination of care regarding urinary tract infection treatment, prevention, prognosis, and need for follow-up..   ASSESSMENT & PLAN: Marielys was seen today for medication problem, depression and urinary tract infection.  Diagnoses and all orders for this visit:  Infective urethritis Comments: Improved Orders: -     POCT urinalysis dipstick -     Urine Culture -     Comprehensive metabolic panel -     CBC with Differential/Platelet    Patient Instructions  Urinary Tract Infection, Adult A urinary tract infection (UTI) is an infection of any part of the urinary tract. The urinary tract includes the:  Kidneys.  Ureters.  Bladder.  Urethra.  These organs make, store, and get rid of pee (urine) in the body. Follow these instructions at home:  Take over-the-counter and prescription medicines only as told by your doctor.  If you were prescribed an antibiotic medicine, take it as told by your doctor. Do not stop taking the antibiotic even if you start to feel better.  Avoid the following drinks: ? Alcohol. ? Caffeine. ?  Tea. ? Carbonated drinks.  Drink enough fluid to keep your pee clear or pale yellow.  Keep all follow-up visits as told by your doctor. This  is important.  Make sure to: ? Empty your bladder often and completely. Do not to hold pee for long periods of time. ? Empty your bladder before and after sex. ? Wipe from front to back after a bowel movement if you are female. Use each tissue one time when you wipe. Contact a doctor if:  You have back pain.  You have a fever.  You feel sick to your stomach (nauseous).  You throw up (vomit).  Your symptoms do not get better after 3 days.  Your symptoms go away and then come back. Get help right away if:  You have very bad back pain.  You have very bad lower belly (abdominal) pain.  You are throwing up and cannot keep down any medicines or water. This information is not intended to replace advice given to you by your health care provider. Make sure you discuss any questions you have with your health care provider. Document Released: 03/25/2008 Document Revised: 03/14/2016 Document Reviewed: 08/28/2015 Elsevier Interactive Patient Education  2018 Elsevier Inc.      Agustina Caroli, MD [facility]

## 2018-03-13 NOTE — Patient Instructions (Signed)

## 2018-03-14 LAB — URINE CULTURE

## 2018-03-17 ENCOUNTER — Encounter: Payer: Self-pay | Admitting: *Deleted

## 2018-03-31 ENCOUNTER — Encounter: Payer: Self-pay | Admitting: Emergency Medicine

## 2018-03-31 ENCOUNTER — Ambulatory Visit (INDEPENDENT_AMBULATORY_CARE_PROVIDER_SITE_OTHER): Payer: Medicare Other | Admitting: Emergency Medicine

## 2018-03-31 ENCOUNTER — Other Ambulatory Visit: Payer: Self-pay

## 2018-03-31 ENCOUNTER — Ambulatory Visit (INDEPENDENT_AMBULATORY_CARE_PROVIDER_SITE_OTHER): Payer: Medicare Other

## 2018-03-31 VITALS — BP 160/64 | HR 80 | Temp 97.9°F | Resp 16

## 2018-03-31 DIAGNOSIS — R1084 Generalized abdominal pain: Secondary | ICD-10-CM | POA: Diagnosis not present

## 2018-03-31 LAB — POCT CBC
Granulocyte percent: 69.3 %G (ref 37–80)
HEMATOCRIT: 35.3 % — AB (ref 37.7–47.9)
Hemoglobin: 11.4 g/dL — AB (ref 12.2–16.2)
LYMPH, POC: 1.8 (ref 0.6–3.4)
MCH, POC: 31.1 pg (ref 27–31.2)
MCHC: 32.4 g/dL (ref 31.8–35.4)
MCV: 95.9 fL (ref 80–97)
MID (CBC): 0.8 (ref 0–0.9)
MPV: 6.2 fL (ref 0–99.8)
POC GRANULOCYTE: 5.8 (ref 2–6.9)
POC LYMPH PERCENT: 21.4 %L (ref 10–50)
POC MID %: 9.3 % (ref 0–12)
Platelet Count, POC: 279 10*3/uL (ref 142–424)
RBC: 3.68 M/uL — AB (ref 4.04–5.48)
RDW, POC: 13.6 %
WBC: 8.3 10*3/uL (ref 4.6–10.2)

## 2018-03-31 LAB — POCT URINALYSIS DIP (MANUAL ENTRY)
BILIRUBIN UA: NEGATIVE
BILIRUBIN UA: NEGATIVE mg/dL
Glucose, UA: NEGATIVE mg/dL
LEUKOCYTES UA: NEGATIVE
NITRITE UA: NEGATIVE
PH UA: 7.5 (ref 5.0–8.0)
Protein Ur, POC: NEGATIVE mg/dL
Spec Grav, UA: 1.015 (ref 1.010–1.025)
Urobilinogen, UA: 0.2 E.U./dL

## 2018-03-31 MED ORDER — RANITIDINE HCL 300 MG PO TABS: 300.0000 mg | ORAL_TABLET | Freq: Every day | ORAL | 1 refills | Status: DC

## 2018-03-31 MED ORDER — OMEPRAZOLE 20 MG PO CPDR: 20.0000 mg | DELAYED_RELEASE_CAPSULE | Freq: Every day | ORAL | 3 refills | Status: DC

## 2018-03-31 NOTE — Patient Instructions (Addendum)
IF you received an x-ray today, you will receive an invoice from [facility]. Please contact [facility] at [phone] with questions or concerns regarding your invoice.   IF you received labwork today, you will receive an invoice from [facility]. Please contact [facility] at [phone] with questions or concerns regarding your invoice.   Our billing staff will not be able to assist you with questions regarding bills from these companies.  You will be contacted with the lab results as soon as they are available. The fastest way to get your results is to activate your My Chart account. Instructions are located on the last page of this paperwork. If you have not heard from [place] regarding the results in 2 weeks, please contact this office.     ?au b?ng, Ng??i l?n Abdominal Pain, Adult Nhi?u nguyn nhn c th? gy ra ?au b?ng. ?au b?ng th??ng khng nghim tr?ng v ?? m khng c?n ph?i ?i?u tr? ho?c khi ???c ?i?u tr? t?i nh. Tuy nhin, ?i khi ?au b?ng l nghim tr?ng. Chuyn gia ch?m [place] s?c kh?e c?a qu v? s? khai thc b?nh s? v khm th?c th? ?? xc ??nh nguyn nhn gy ra ?au b?ng. Tun th? nh?ng h??ng d?n ny ? nh:  Ch? s? d?ng thu?c khng k ??n v thu?c k ??n theo ch? d?n c?a chuyn gia ch?m [place] s?c kh?e. Khng dng thu?c nhu?n trng tr? khi ???c chuyn gia ch?m [place] s?c kh?e c?a qu v? ch? d?n.  U?ng ?? n??c ?? gi? cho n??c ti?u trong ho?c c mu vng nh?t.  Theo di tnh tr?ng c?a qu v? ?? pht hi?n b?t k? thay ??i no.  Tun th? t?t c? cc cu?c h?n khm l?i theo ch? d?n c?a chuyn gia ch?m [place] s?c kh?e. ?i?u ny c vai tr quan tr?ng. Hy lin l?c v?i chuyn gia ch?m [place] s?c kh?e n?u:  Tnh tr?ng ?au b?ng c?a qu v? thay ??i ho?c tr?m tr?ng h?n.  Qu v? khng ?i ho?c qu v? b? s?t cn m khng c? g?ng gi?m cn.  Qu v? b? to bn ho?c b? tiu ch?y qu 2-3 ngy.  Qu v? b? ?au khi ?i ti?u ho?c ?i ngoi.  ?au b?ng khi?n qu v? t?nh gi?c vo ban  ?m.  ?au tr? nn tr?m tr?ng h?n khi ?n, sau khi ?n, ho?c khi ?n m?t s? th?c ph?m nh?t ??nh.  Qu v? nn v khng th? km l?i ???c.  Qu v? b? s?t. Yu c?u tr? gip ngay l?p t?c n?u:  C?n ?au c?a qu v? khng h?t s?m theo nh? d? ki?n c?a chuyn gia ch?m [place] s?c kh?e c?a qu v?.  Qu v? v?n ti?p t?c b? nn.  Ch? c?m th?y ?au ? cc vng c?a b?ng, ch?ng h?n nh? ? ph?n b?ng d??i bn ph?i ho?c bn tri.  Quy? vi? ?i ngoi phn c ma?u ho??c phn ma?u ?en, ho??c phn trng nh? h??c i?n.  Qu v? b? ?au r?t nhi?u, co th?t ho??c ch???ng b?ng.  Qu v? c cc d?u hi?u m?t n??c, ch?ng h?n nh?: ? N??c ti?u s?m mu, r?t t n??c ti?u, ho?c khng c n??c ti?u. ? Mi n?t n?. ? Mi?ng kh. ? M?t tr?ng. ? Bu?n ng?. ? Y?u. Thng tin ny khng nh?m m?c ?ch thay th? cho l?i khuyn m chuyn gia ch?m  s?c kh?e ni v?i qu v?. Hy b?o ??m qu v? ph?i th?o lu?n b?t k? v?n ?? g m qu v?  c v?i chuyn gia ch?m [place] s?c kh?e c?a qu v?. Document Released: 10/07/2005 Document Revised: 09/26/2016 Document Reviewed: 03/20/2016 Elsevier Interactive Patient Education  2018 [employer].

## 2018-03-31 NOTE — Progress Notes (Signed)
Barbara Dennis 84 y.o.   Chief Complaint  Patient presents with  . Abdominal Pain    stomach pains x 2 weeks  . Emesis    HISTORY OF PRESENT ILLNESS: This is a 84 y.o. female complaining of generalized abdominal pain on and off for the past 2 weeks.  Mostly lower abdominal but at times in the epigastric area.  Sometimes feel nauseous, vomited once.  Denies chest pain or difficulty breathing.  Denies fever or chills.  HPI   Prior to Admission medications   Medication Sig Start Date End Date Taking? Authorizing Provider  acetaminophen (TYLENOL ARTHRITIS PAIN) 650 MG CR tablet Take 1 tablet (650 mg total) by mouth every 12 (twelve) hours. For joint pain 02/22/16  Yes Shawnee Knapp, MD  Calcium Carbonate Antacid (TUMS PO) Take by mouth.   Yes [provider]  Glucos-Chond-Hyal Ac-Ca Fructo (MOVE FREE JOINT HEALTH ADVANCE) TABS Take 1 tablet by mouth 2 (two) times daily. Or as instructed by packaging 02/22/16  Yes Shawnee Knapp, MD  lisinopril (PRINIVIL,ZESTRIL) 10 MG tablet TAKE 1 TABLET(10 MG) BY MOUTH DAILY 07/15/17  Yes Chanya Chrisley, Ines Bloomer, MD  MELATONIN PO Take by mouth.   Yes [provider]  methocarbamol (ROBAXIN) 500 MG tablet TAKE 1 TABLET(500 MG) BY MOUTH AT BEDTIME AS NEEDED FOR MUSCLE SPASMS 04/18/17  Yes English, Perry D, PA  Misc Natural Products (OSTEO BI-FLEX TRIPLE STRENGTH PO) Take by mouth.   Yes [provider]  tiZANidine (ZANAFLEX) 2 MG tablet Take 1 tablet (2 mg total) by mouth at bedtime. 02/22/16  Yes Shawnee Knapp, MD  traZODone (DESYREL) 50 MG tablet Take 0.5-1 tablets (25-50 mg total) by mouth at bedtime as needed for sleep. 03/04/18  Yes Wynonia Medero, Ines Bloomer, MD  albuterol (PROVENTIL HFA;VENTOLIN HFA) 108 (90 Base) MCG/ACT inhaler Inhale 2 puffs into the lungs every 4 (four) hours as needed for wheezing or shortness of breath (cough, shortness of breath or wheezing.). Patient not taking: Reported on 03/31/2018 01/21/17   Ivar Drape D, PA    diclofenac sodium (VOLTAREN) 1 % GEL Apply 2 g topically 4 (four) times daily. Patient not taking: Reported on 03/31/2018 10/27/17   Thurman Coyer, DO  fexofenadine (ALLEGRA) 30 MG tablet Take 30 mg by mouth 2 (two) times daily.    [provider]  gabapentin (NEURONTIN) 100 MG capsule Take 1 capsule (100 mg total) by mouth at bedtime. 09/23/17   Thurman Coyer, DO  meloxicam (MOBIC) 7.5 MG tablet Take 1 tablet (7.5 mg total) by mouth daily. Patient not taking: Reported on 03/04/2018 02/10/17   Ivar Drape D, PA  omeprazole (PRILOSEC) 20 MG capsule Take 2 capsules (40 mg total) by mouth 2 (two) times daily. 30 minutes prior to breakfast Patient not taking: Reported on 03/31/2018 04/08/16   Shawnee Knapp, MD  polyethylene glycol powder (GLYCOLAX/MIRALAX) powder Take 17 g by mouth 2 (two) times daily as needed. Patient not taking: Reported on 03/31/2018 02/27/17   Ivar Drape D, PA  ranitidine (ZANTAC) 150 MG tablet Take 1 tablet (150 mg total) by mouth 2 (two) times daily as needed for heartburn (or indigestion). Patient not taking: Reported on 03/31/2018 02/22/16   Shawnee Knapp, MD  traMADol (ULTRAM) 50 MG tablet Take 1 tablet (50 mg total) by mouth every 8 (eight) hours as needed for severe pain. Patient not taking: Reported on 03/31/2018 03/04/18   Horald Pollen, MD  varenicline (CHANTIX) 0.5 MG tablet Take  1 tablet (0.5 mg total) by mouth 2 (two) times daily. Patient not taking: Reported on 03/31/2018 08/20/17   Horald Pollen, MD    No Known Allergies  Patient Active Problem List   Diagnosis Date Noted  . Infective urethritis 03/13/2018  . Acute UTI 01/07/2018  . Chronic pain syndrome 01/07/2018  . Knee pain, bilateral 09/09/2017  . Chronic obstructive pulmonary disease ([place]) 08/20/2017  . Multinodular goiter 08/15/2017  . Musculoskeletal pain 07/15/2017  . Bilateral leg pain 07/15/2017  . Chronic bilateral low back pain without sciatica 07/15/2017  .  Osteopenia of multiple sites 07/15/2017  . Essential hypertension, benign 07/15/2017  . Hypercalcemia 07/15/2017  . Cough 06/16/2017  . Bad odor of urine 06/16/2017  . Atypical chest pain 06/16/2017  . Sciatica of left side 03/10/2017  . Depression with anxiety 01/06/2015  . Cervical adenopathy 03/31/2014  . 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 HTN, goal below 150/90 06/17/2013  . DJD (degenerative joint disease) 02/16/2013  . Tobacco user 09/23/2012  . Menopause 08/21/2012  . Postmenopausal atrophic vaginitis 08/21/2012    Past Medical History:  Diagnosis Date  . Hypertension   . Mass of right side of neck     Past Surgical History:  Procedure Laterality Date  . APPENDECTOMY      Social History   Socioeconomic History  . Marital status: Widowed    Spouse name: Not on file  . Number of children: Not on file  . Years of education: Not on file  . Highest education level: Not on file  Occupational History  . Not on file  Social Needs  . Financial resource strain: Not on file  . Food insecurity:    Worry: Not on file    Inability: Not on file  . Transportation needs:    Medical: Not on file    Non-medical: Not on file  Tobacco Use  . Smoking status: Current Every Day Smoker    Packs/day: 0.70    Years: 60.00    Pack years: 42.00    Types: Cigarettes  . Smokeless tobacco: Never Used  Substance and Sexual Activity  . Alcohol use: No    Alcohol/week: 0.0 oz  . Drug use: No  . Sexual activity: Not on file  Lifestyle  . Physical activity:    Days per week: Not on file    Minutes per session: Not on file  . Stress: Not on file  Relationships  . Social connections:    Talks on phone: Not on file    Gets together: Not on file    Attends religious service: Not on file    Active member of club or organization: Not on file    Attends meetings of clubs or organizations: Not on file    Relationship status: Not on file  . Intimate partner violence:    Fear  of current or ex partner: Not on file    Emotionally abused: Not on file    Physically abused: Not on file    Forced sexual activity: Not on file  Other Topics Concern  . Not on file  Social History Narrative   Marital status: widowed 28 years ago.  From [place]; moved to [place] 1974.     Children: none       Lives:  Alone; brother in law is Psychologist, sport and exercise who is 90.      Employment: retired.       Tobacco:  1/2 ppd  Alcohol:  None      Exercise:  Walking daily.      ADLs: no driving.    Family History  Problem Relation Age of Onset  . Hypercalcemia Neg Hx      Review of Systems  Constitutional: Negative.  Negative for chills and fever.  HENT: Negative.  Negative for sore throat.   Eyes: Negative.   Respiratory: Negative.  Negative for cough and shortness of breath.   Cardiovascular: Negative.  Negative for chest pain and palpitations.  Gastrointestinal: Positive for abdominal pain and nausea. Negative for blood in stool, diarrhea and melena.  Genitourinary: Negative.  Negative for dysuria and hematuria.  Musculoskeletal: Negative for back pain, myalgias and neck pain.  Skin: Negative.  Negative for rash.  Neurological: Negative for dizziness and headaches.  Endo/Heme/Allergies: Negative.   All other systems reviewed and are negative.  Vitals:   03/31/18 1401  BP: (!) 160/64  Pulse: 80  Resp: 16  Temp: 97.9 F (36.6 C)  SpO2: 97%     Physical Exam  Constitutional: She is oriented to person, place, and time. She appears well-developed and well-nourished.  HENT:  Head: Normocephalic and atraumatic.  Nose: Nose normal.  Mouth/Throat: Oropharynx is clear and moist.  Eyes: Pupils are equal, round, and reactive to light. Conjunctivae and EOM are normal.  Neck: Normal range of motion. Neck supple.  Cardiovascular: Normal rate, regular rhythm and normal heart sounds.  Pulmonary/Chest: Effort normal and breath sounds normal.  Abdominal: Soft. Bowel sounds are normal. She  exhibits no distension and no mass. There is no tenderness. There is no rebound.  Musculoskeletal: Normal range of motion.  Neurological: She is alert and oriented to person, place, and time. No sensory deficit. She exhibits normal muscle tone.  Skin: Skin is warm and dry. Capillary refill takes less than 2 seconds.  Psychiatric: She has a normal mood and affect. Her behavior is normal.  Vitals reviewed.   Dg Abd Acute W/chest  Result Date: 03/31/2018 CLINICAL DATA:  Abdominal pain, generalized. EXAM: DG ABDOMEN ACUTE W/ 1V CHEST COMPARISON:  Multiple films 2018. FINDINGS: Patient has a moderate amount colonic stool and gas. The small bowel pattern is normal. No free air. Vascular calcification is noted. No acute bone finding. One-view chest shows normal heart size. The aorta shows atherosclerosis and unfolding. The lungs are clear except for some chronic markings and scattered granulomas. No free air under the diaphragm. IMPRESSION: No acute finding by radiography. Moderate amount of colonic gas and stool, not unusual. Aortic atherosclerosis. Electronically Signed   By: Nelson Chimes M.D.   On: 03/31/2018 15:02   Results for orders placed or performed in visit on 03/31/18 (from the past 24 hour(s))  POCT CBC     Status: Abnormal   Collection Time: 03/31/18  3:02 PM  Result Value Ref Range   WBC 8.3 4.6 - 10.2 K/uL   Lymph, poc 1.8 0.6 - 3.4   POC LYMPH PERCENT 21.4 10 - 50 %L   MID (cbc) 0.8 0 - 0.9   POC MID % 9.3 0 - 12 %M   POC Granulocyte 5.8 2 - 6.9   Granulocyte percent 69.3 37 - 80 %G   RBC 3.68 (A) 4.04 - 5.48 M/uL   Hemoglobin 11.4 (A) 12.2 - 16.2 g/dL   HCT, POC 35.3 (A) 37.7 - 47.9 %   MCV 95.9 80 - 97 fL   MCH, POC 31.1 27 - 31.2 pg   MCHC 32.4 31.8 -  35.4 g/dL   RDW, POC 13.6 %   Platelet Count, POC 279 142 - 424 K/uL   MPV 6.2 0 - 99.8 fL  POCT urinalysis dipstick     Status: Abnormal   Collection Time: 03/31/18  3:35 PM  Result Value Ref Range   Color, UA yellow  yellow   Clarity, UA clear clear   Glucose, UA negative negative mg/dL   Bilirubin, UA negative negative   Ketones, POC UA negative negative mg/dL   Spec Grav, UA 1.015 1.010 - 1.025   Blood, UA trace-intact (A) negative   pH, UA 7.5 5.0 - 8.0   Protein Ur, POC negative negative mg/dL   Urobilinogen, UA 0.2 0.2 or 1.0 E.U./dL   Nitrite, UA Negative Negative   Leukocytes, UA Negative Negative   A total of 25 minutes was spent in the room with the patient and daughter, greater than 50% of which was in counseling/coordination of care regarding differential diagnosis, management, medications, and need for GI follow-up.  ASSESSMENT & PLAN: Lesle was seen today for abdominal pain and emesis.  Diagnoses and all orders for this visit:  Generalized abdominal pain -     Lipase -     POCT CBC -     Comprehensive metabolic panel -     Urine Culture -     POCT urinalysis dipstick -     DG Abd Acute W/Chest; Future -     ranitidine (ZANTAC) 300 MG tablet; Take 1 tablet (300 mg total) by mouth at bedtime for 14 days. -     omeprazole (PRILOSEC) 20 MG capsule; Take 1 capsule (20 mg total) by mouth daily. -     Ambulatory referral to Gastroenterology    Patient Instructions       IF you received an x-ray today, you will receive an invoice from [facility]. Please contact [facility] at [phone] with questions or concerns regarding your invoice.   IF you received labwork today, you will receive an invoice from [facility]. Please contact [facility] at [phone] with questions or concerns regarding your invoice.   Our billing staff will not be able to assist you with questions regarding bills from these companies.  You will be contacted with the lab results as soon as they are available. The fastest way to get your results is to activate your My Chart account. Instructions are located on the last page of this paperwork. If you have not heard from [place] regarding the results  in 2 weeks, please contact this office.     ?au b?ng, Ng??i l?n Abdominal Pain, Adult Nhi?u nguyn nhn c th? gy ra ?au b?ng. ?au b?ng th??ng khng nghim tr?ng v ?? m khng c?n ph?i ?i?u tr? ho?c khi ???c ?i?u tr? t?i nh. Tuy nhin, ?i khi ?au b?ng l nghim tr?ng. Chuyn gia ch?m [place] s?c kh?e c?a qu v? s? khai thc b?nh s? v khm th?c th? ?? xc ??nh nguyn nhn gy ra ?au b?ng. Tun th? nh?ng h??ng d?n ny ? nh:  Ch? s? d?ng thu?c khng k ??n v thu?c k ??n theo ch? d?n c?a chuyn gia ch?m [place] s?c kh?e. Khng dng thu?c nhu?n trng tr? khi ???c chuyn gia ch?m [place] s?c kh?e c?a qu v? ch? d?n.  U?ng ?? n??c ?? gi? cho n??c ti?u trong ho?c c mu vng nh?t.  Theo di tnh tr?ng c?a qu v? ?? pht hi?n b?t k? thay ??i no.  Tun th? t?t c? cc cu?c h?n  khm l?i theo ch? d?n c?a chuyn gia ch?m [place] s?c kh?e. ?i?u ny c vai tr quan tr?ng. Hy lin l?c v?i chuyn gia ch?m [place] s?c kh?e n?u:  Tnh tr?ng ?au b?ng c?a qu v? thay ??i ho?c tr?m tr?ng h?n.  Qu v? khng ?i ho?c qu v? b? s?t cn m khng c? g?ng gi?m cn.  Qu v? b? to bn ho?c b? tiu ch?y qu 2-3 ngy.  Qu v? b? ?au khi ?i ti?u ho?c ?i ngoi.  ?au b?ng khi?n qu v? t?nh gi?c vo ban ?m.  ?au tr? nn tr?m tr?ng h?n khi ?n, sau khi ?n, ho?c khi ?n m?t s? th?c ph?m nh?t ??nh.  Qu v? nn v khng th? km l?i ???c.  Qu v? b? s?t. Yu c?u tr? gip ngay l?p t?c n?u:  C?n ?au c?a qu v? khng h?t s?m theo nh? d? ki?n c?a chuyn gia ch?m [place] s?c kh?e c?a qu v?.  Qu v? v?n ti?p t?c b? nn.  Ch? c?m th?y ?au ? cc vng c?a b?ng, ch?ng h?n nh? ? ph?n b?ng d??i bn ph?i ho?c bn tri.  Quy? vi? ?i ngoi phn c ma?u ho??c phn ma?u ?en, ho??c phn trng nh? h??c i?n.  Qu v? b? ?au r?t nhi?u, co th?t ho??c ch???ng b?ng.  Qu v? c cc d?u hi?u m?t n??c, ch?ng h?n nh?: ? N??c ti?u s?m mu, r?t t n??c ti?u, ho?c khng c n??c ti?u. ? Mi n?t n?. ? Mi?ng kh. ? M?t tr?ng. ? Bu?n ng?. ? Y?u. Thng  tin ny khng nh?m m?c ?ch thay th? cho l?i khuyn m chuyn gia ch?m [place] s?c kh?e ni v?i qu v?. Hy b?o ??m qu v? ph?i th?o lu?n b?t k? v?n ?? g m qu v? c v?i chuyn gia ch?m [place] s?c kh?e c?a qu v?. Document Released: 10/07/2005 Document Revised: 09/26/2016 Document Reviewed: 03/20/2016 Elsevier Interactive Patient Education  2018 [employer]      Agustina Caroli, MD [facility]

## 2018-04-01 ENCOUNTER — Encounter: Payer: Self-pay | Admitting: Radiology

## 2018-04-01 LAB — COMPREHENSIVE METABOLIC PANEL
ALBUMIN: 4.4 g/dL (ref 3.5–4.7)
ALK PHOS: 70 IU/L (ref 39–117)
ALT: 13 IU/L (ref 0–32)
AST: 16 IU/L (ref 0–40)
Albumin/Globulin Ratio: 1.3 (ref 1.2–2.2)
BILIRUBIN TOTAL: 0.4 mg/dL (ref 0.0–1.2)
BUN / CREAT RATIO: 15 (ref 12–28)
BUN: 14 mg/dL (ref 8–27)
CO2: 21 mmol/L (ref 20–29)
CREATININE: 0.92 mg/dL (ref 0.57–1.00)
Calcium: 10.5 mg/dL — ABNORMAL HIGH (ref 8.7–10.3)
Chloride: 88 mmol/L — ABNORMAL LOW (ref 96–106)
GFR calc Af Amer: 67 mL/min/{1.73_m2} (ref >59)
GFR calc non Af Amer: 58 mL/min/{1.73_m2} — ABNORMAL LOW (ref >59)
GLOBULIN, TOTAL: 3.5 g/dL (ref 1.5–4.5)
GLUCOSE: 96 mg/dL (ref 65–99)
Potassium: 4.5 mmol/L (ref 3.5–5.2)
SODIUM: 128 mmol/L — AB (ref 134–144)
Total Protein: 7.9 g/dL (ref 6.0–8.5)

## 2018-04-01 LAB — URINE CULTURE

## 2018-04-01 LAB — LIPASE: Lipase: 64 U/L (ref 14–85)

## 2018-05-01 ENCOUNTER — Encounter: Payer: Self-pay | Admitting: Emergency Medicine

## 2018-05-01 ENCOUNTER — Ambulatory Visit (INDEPENDENT_AMBULATORY_CARE_PROVIDER_SITE_OTHER): Payer: Medicare Other | Admitting: Emergency Medicine

## 2018-05-01 ENCOUNTER — Other Ambulatory Visit: Payer: Self-pay

## 2018-05-01 DIAGNOSIS — R1084 Generalized abdominal pain: Secondary | ICD-10-CM | POA: Diagnosis not present

## 2018-05-01 DIAGNOSIS — M545 Low back pain, unspecified: Secondary | ICD-10-CM

## 2018-05-01 DIAGNOSIS — M79605 Pain in left leg: Secondary | ICD-10-CM

## 2018-05-01 DIAGNOSIS — M79604 Pain in right leg: Secondary | ICD-10-CM

## 2018-05-01 DIAGNOSIS — M7918 Myalgia, other site: Secondary | ICD-10-CM | POA: Diagnosis not present

## 2018-05-01 DIAGNOSIS — G8929 Other chronic pain: Secondary | ICD-10-CM

## 2018-05-01 MED ORDER — RANITIDINE HCL 300 MG PO TABS: 300.0000 mg | ORAL_TABLET | Freq: Every day | ORAL | 1 refills | Status: DC

## 2018-05-01 MED ORDER — TRAMADOL HCL 50 MG PO TABS: 50.0000 mg | ORAL_TABLET | Freq: Three times a day (TID) | ORAL | 0 refills | Status: DC | PRN

## 2018-05-01 MED ORDER — OMEPRAZOLE 20 MG PO CPDR: 20.0000 mg | DELAYED_RELEASE_CAPSULE | Freq: Every day | ORAL | 3 refills | Status: DC

## 2018-05-01 NOTE — Progress Notes (Signed)
Barbara Dennis 84 y.o.   Chief Complaint  Patient presents with  . Abdominal Pain    per patient wants REFERRAL to GI and vomiting  . Medication Refill    ranitidine, prilosec and tramadol    HISTORY OF PRESENT ILLNESS: This is a 84 y.o. female complaining of chronic lower abdominal pain.  Seen by me on 03/31/2018 for the same.  Refer to GI then but never scheduled.  Chronic smoker.  Eating and drinking well.  Denies nausea or vomiting.  No fever or chills.  Denies urinary symptoms.  Denies diarrhea, rectal bleeding, or melena.  Pain is intermittent, short-lived, crampy, with no radiation.  Not associated to any other significant symptoms.  Translator: Dongho #[id number]  HPI   Prior to Admission medications   Medication Sig Start Date End Date Taking? Authorizing Provider  acetaminophen (TYLENOL ARTHRITIS PAIN) 650 MG CR tablet Take 1 tablet (650 mg total) by mouth every 12 (twelve) hours. For joint pain 02/22/16  Yes Shawnee Knapp, MD  albuterol (PROVENTIL HFA;VENTOLIN HFA) 108 (90 Base) MCG/ACT inhaler Inhale 2 puffs into the lungs every 4 (four) hours as needed for wheezing or shortness of breath (cough, shortness of breath or wheezing.). 01/21/17  Yes English, Colletta Maryland D, PA  lisinopril (PRINIVIL,ZESTRIL) 10 MG tablet TAKE 1 TABLET(10 MG) BY MOUTH DAILY 07/15/17  Yes Letrell Attwood, Ines Bloomer, MD  MELATONIN PO Take by mouth.   Yes [provider]  Misc Natural Products (OSTEO BI-FLEX TRIPLE STRENGTH PO) Take by mouth.   Yes [provider]  polyethylene glycol powder (GLYCOLAX/MIRALAX) powder Take 17 g by mouth 2 (two) times daily as needed. 02/27/17  Yes English, Colletta Maryland D, PA  traMADol (ULTRAM) 50 MG tablet Take 1 tablet (50 mg total) by mouth every 8 (eight) hours as needed for severe pain. 03/04/18  Yes Aaralynn Shepheard, Ines Bloomer, MD  traZODone (DESYREL) 50 MG tablet Take 0.5-1 tablets (25-50 mg total) by mouth at bedtime as needed for sleep. 03/04/18  Yes Mariadelcarmen Corella, Ines Bloomer, MD    Calcium Carbonate Antacid (TUMS PO) Take by mouth.    [provider]  diclofenac sodium (VOLTAREN) 1 % GEL Apply 2 g topically 4 (four) times daily. Patient not taking: Reported on 03/31/2018 10/27/17   Thurman Coyer, DO  fexofenadine (ALLEGRA) 30 MG tablet Take 30 mg by mouth 2 (two) times daily.    [provider]  gabapentin (NEURONTIN) 100 MG capsule Take 1 capsule (100 mg total) by mouth at bedtime. Patient not taking: Reported on 05/01/2018 09/23/17   Thurman Coyer, DO  Glucos-Chond-Hyal Ac-Ca Fructo (MOVE FREE JOINT HEALTH ADVANCE) TABS Take 1 tablet by mouth 2 (two) times daily. Or as instructed by packaging Patient not taking: Reported on 05/01/2018 02/22/16   Shawnee Knapp, MD  meloxicam (MOBIC) 7.5 MG tablet Take 1 tablet (7.5 mg total) by mouth daily. Patient not taking: Reported on 03/04/2018 02/10/17   Ivar Drape D, PA  methocarbamol (ROBAXIN) 500 MG tablet TAKE 1 TABLET(500 MG) BY MOUTH AT BEDTIME AS NEEDED FOR MUSCLE SPASMS 04/18/17   Ivar Drape D, PA  omeprazole (PRILOSEC) 20 MG capsule Take 1 capsule (20 mg total) by mouth daily. Patient not taking: Reported on 05/01/2018 03/31/18   Horald Pollen, MD  ranitidine (ZANTAC) 300 MG tablet Take 1 tablet (300 mg total) by mouth at bedtime for 14 days. 03/31/18 04/14/18  Horald Pollen, MD  tiZANidine (ZANAFLEX) 2 MG tablet Take 1 tablet (2 mg total) by  mouth at bedtime. 02/22/16   Shawnee Knapp, MD  varenicline (CHANTIX) 0.5 MG tablet Take 1 tablet (0.5 mg total) by mouth 2 (two) times daily. Patient not taking: Reported on 03/31/2018 08/20/17   Horald Pollen, MD    No Known Allergies  Patient Active Problem List   Diagnosis Date Noted  . Chronic pain syndrome 01/07/2018  . Chronic obstructive pulmonary disease ([place]) 08/20/2017  . Multinodular goiter 08/15/2017  . Chronic bilateral low back pain without sciatica 07/15/2017  . Osteopenia of multiple sites 07/15/2017  . Essential  hypertension, benign 07/15/2017  . Hypercalcemia 07/15/2017  . Atypical chest pain 06/16/2017  . Depression with anxiety 01/06/2015  . Cervical adenopathy 03/31/2014  . 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 HTN, goal below 150/90 06/17/2013  . DJD (degenerative joint disease) 02/16/2013  . Tobacco user 09/23/2012  . Menopause 08/21/2012  . Postmenopausal atrophic vaginitis 08/21/2012    Past Medical History:  Diagnosis Date  . Hypertension   . Mass of right side of neck     Past Surgical History:  Procedure Laterality Date  . APPENDECTOMY      Social History   Socioeconomic History  . Marital status: Widowed    Spouse name: Not on file  . Number of children: Not on file  . Years of education: Not on file  . Highest education level: Not on file  Occupational History  . Not on file  Social Needs  . Financial resource strain: Not on file  . Food insecurity:    Worry: Not on file    Inability: Not on file  . Transportation needs:    Medical: Not on file    Non-medical: Not on file  Tobacco Use  . Smoking status: Current Every Day Smoker    Packs/day: 0.70    Years: 60.00    Pack years: 42.00    Types: Cigarettes  . Smokeless tobacco: Never Used  Substance and Sexual Activity  . Alcohol use: No    Alcohol/week: 0.0 oz  . Drug use: No  . Sexual activity: Not on file  Lifestyle  . Physical activity:    Days per week: Not on file    Minutes per session: Not on file  . Stress: Not on file  Relationships  . Social connections:    Talks on phone: Not on file    Gets together: Not on file    Attends religious service: Not on file    Active member of club or organization: Not on file    Attends meetings of clubs or organizations: Not on file    Relationship status: Not on file  . Intimate partner violence:    Fear of current or ex partner: Not on file    Emotionally abused: Not on file    Physically abused: Not on file    Forced sexual activity: Not on  file  Other Topics Concern  . Not on file  Social History Narrative   Marital status: widowed 28 years ago.  From [place]; moved to [place] 1974.     Children: none       Lives:  Alone; brother in law is Psychologist, sport and exercise who is 42.      Employment: retired.       Tobacco:  1/2 ppd       Alcohol:  None      Exercise:  Walking daily.      ADLs: no driving.    Family History  Problem Relation  Age of Onset  . Hypercalcemia Neg Hx      Review of Systems  Constitutional: Negative.  Negative for chills and fever.  HENT: Negative.  Negative for sore throat.   Eyes: Negative.  Negative for discharge and redness.  Respiratory: Negative.  Negative for cough, shortness of breath and wheezing.   Cardiovascular: Negative.  Negative for chest pain and palpitations.  Gastrointestinal: Positive for abdominal pain. Negative for blood in stool, diarrhea, nausea and vomiting.  Genitourinary: Negative.  Negative for dysuria and hematuria.  Musculoskeletal: Negative.  Negative for myalgias and neck pain.  Skin: Negative.  Negative for rash.  Neurological: Negative.  Negative for dizziness and headaches.  Endo/Heme/Allergies: Negative.   All other systems reviewed and are negative.  Vitals:   05/01/18 1509  BP: 138/60  Pulse: 80  Resp: 16  Temp: 98 F (36.7 C)  SpO2: 98%     Physical Exam  Constitutional: She is oriented to person, place, and time. She appears well-developed and well-nourished.  HENT:  Head: Normocephalic and atraumatic.  Mouth/Throat: Oropharynx is clear and moist.  Eyes: Pupils are equal, round, and reactive to light. Conjunctivae and EOM are normal.  Neck: Normal range of motion. Neck supple.  Cardiovascular: Normal rate, regular rhythm and normal heart sounds.  Pulmonary/Chest: Effort normal and breath sounds normal.  Abdominal: Soft. Bowel sounds are normal. She exhibits no distension. There is no tenderness.  Musculoskeletal: Normal range of motion.  Lymphadenopathy:    She  has no cervical adenopathy.  Neurological: She is alert and oriented to person, place, and time. No sensory deficit. She exhibits normal muscle tone.  Skin: Skin is warm and dry. Capillary refill takes less than 2 seconds. No rash noted.  Psychiatric: She has a normal mood and affect. Her behavior is normal.  Vitals reviewed.  A total of 25 minutes was spent in the room with the patient, greater than 50% of which was in counseling/coordination of care regarding differential diagnosis, treatment, medications, need for diagnostic imaging (CT scan abdomen and pelvis), need for follow-up with GI.   ASSESSMENT & PLAN: Barbara Dennis was seen today for abdominal pain and medication refill.  Diagnoses and all orders for this visit:  Generalized abdominal pain -     ranitidine (ZANTAC) 300 MG tablet; Take 1 tablet (300 mg total) by mouth at bedtime for 14 days. -     omeprazole (PRILOSEC) 20 MG capsule; Take 1 capsule (20 mg total) by mouth daily. -     CT Abdomen Pelvis W Contrast; Future -     Ambulatory referral to Gastroenterology  Musculoskeletal pain -     traMADol (ULTRAM) 50 MG tablet; Take 1 tablet (50 mg total) by mouth every 8 (eight) hours as needed for severe pain.  Bilateral leg pain -     traMADol (ULTRAM) 50 MG tablet; Take 1 tablet (50 mg total) by mouth every 8 (eight) hours as needed for severe pain.  Chronic bilateral low back pain without sciatica -     traMADol (ULTRAM) 50 MG tablet; Take 1 tablet (50 mg total) by mouth every 8 (eight) hours as needed for severe pain.    Patient Instructions       IF you received an x-ray today, you will receive an invoice from [facility]. Please contact [facility] at [phone] with questions or concerns regarding your invoice.   IF you received labwork today, you will receive an invoice from [facility]. Please contact [facility] at [phone] with questions  or concerns regarding your invoice.   Our billing staff  will not be able to assist you with questions regarding bills from these companies.  You will be contacted with the lab results as soon as they are available. The fastest way to get your results is to activate your My Chart account. Instructions are located on the last page of this paperwork. If you have not heard from [place] regarding the results in 2 weeks, please contact this office.     Abdominal Pain, Adult Many things can cause belly (abdominal) pain. Most times, belly pain is not dangerous. Many cases of belly pain can be watched and treated at home. Sometimes belly pain is serious, though. Your doctor will try to find the cause of your belly pain. Follow these instructions at home:  Take over-the-counter and prescription medicines only as told by your doctor. Do not take medicines that help you poop (laxatives) unless told to by your doctor.  Drink enough fluid to keep your pee (urine) clear or pale yellow.  Watch your belly pain for any changes.  Keep all follow-up visits as told by your doctor. This is important. Contact a doctor if:  Your belly pain changes or gets worse.  You are not hungry, or you lose weight without trying.  You are having trouble pooping (constipated) or have watery poop (diarrhea) for more than 2-3 days.  You have pain when you pee or poop.  Your belly pain wakes you up at night.  Your pain gets worse with meals, after eating, or with certain foods.  You are throwing up and cannot keep anything down.  You have a fever. Get help right away if:  Your pain does not go away as soon as your doctor says it should.  You cannot stop throwing up.  Your pain is only in areas of your belly, such as the right side or the left lower part of the belly.  You have bloody or black poop, or poop that looks like tar.  You have very bad pain, cramping, or bloating in your belly.  You have signs of not having enough fluid or water in your body (dehydration), such  as: ? Dark pee, very little pee, or no pee. ? Cracked lips. ? Dry mouth. ? Sunken eyes. ? Sleepiness. ? Weakness. This information is not intended to replace advice given to you by your health care provider. Make sure you discuss any questions you have with your health care provider. Document Released: 03/25/2008 Document Revised: 04/26/2016 Document Reviewed: 03/20/2016 Elsevier Interactive Patient Education  2018 Elsevier Inc.      Agustina Caroli, MD [facility]

## 2018-05-01 NOTE — Patient Instructions (Addendum)
     IF you received an x-ray today, you will receive an invoice from [facility]. Please contact [facility] at [phone] with questions or concerns regarding your invoice.   IF you received labwork today, you will receive an invoice from [facility]. Please contact [facility] at [phone] with questions or concerns regarding your invoice.   Our billing staff will not be able to assist you with questions regarding bills from these companies.  You will be contacted with the lab results as soon as they are available. The fastest way to get your results is to activate your My Chart account. Instructions are located on the last page of this paperwork. If you have not heard from us regarding the results in 2 weeks, please contact this office.      Abdominal Pain, Adult Many things can cause belly (abdominal) pain. Most times, belly pain is not dangerous. Many cases of belly pain can be watched and treated at home. Sometimes belly pain is serious, though. Your doctor will try to find the cause of your belly pain. Follow these instructions at home:  Take over-the-counter and prescription medicines only as told by your doctor. Do not take medicines that help you poop (laxatives) unless told to by your doctor.  Drink enough fluid to keep your pee (urine) clear or pale yellow.  Watch your belly pain for any changes.  Keep all follow-up visits as told by your doctor. This is important. Contact a doctor if:  Your belly pain changes or gets worse.  You are not hungry, or you lose weight without trying.  You are having trouble pooping (constipated) or have watery poop (diarrhea) for more than 2-3 days.  You have pain when you pee or poop.  Your belly pain wakes you up at night.  Your pain gets worse with meals, after eating, or with certain foods.  You are throwing up and cannot keep anything down.  You have a fever. Get help right away if:  Your pain does not go  away as soon as your doctor says it should.  You cannot stop throwing up.  Your pain is only in areas of your belly, such as the right side or the left lower part of the belly.  You have bloody or black poop, or poop that looks like tar.  You have very bad pain, cramping, or bloating in your belly.  You have signs of not having enough fluid or water in your body (dehydration), such as: ? Dark pee, very little pee, or no pee. ? Cracked lips. ? Dry mouth. ? Sunken eyes. ? Sleepiness. ? Weakness. This information is not intended to replace advice given to you by your health care provider. Make sure you discuss any questions you have with your health care provider. Document Released: 03/25/2008 Document Revised: 04/26/2016 Document Reviewed: 03/20/2016 Elsevier Interactive Patient Education  2018 Elsevier Inc.  

## 2018-05-27 DIAGNOSIS — K5900 Constipation, unspecified: Secondary | ICD-10-CM | POA: Diagnosis not present

## 2018-05-27 DIAGNOSIS — K219 Gastro-esophageal reflux disease without esophagitis: Secondary | ICD-10-CM | POA: Diagnosis not present

## 2018-05-27 DIAGNOSIS — R1084 Generalized abdominal pain: Secondary | ICD-10-CM | POA: Diagnosis not present

## 2018-06-04 ENCOUNTER — Encounter: Payer: Self-pay | Admitting: Gastroenterology

## 2018-06-10 DIAGNOSIS — M79662 Pain in left lower leg: Secondary | ICD-10-CM | POA: Diagnosis not present

## 2018-06-10 DIAGNOSIS — M549 Dorsalgia, unspecified: Secondary | ICD-10-CM | POA: Diagnosis not present

## 2018-06-10 DIAGNOSIS — R300 Dysuria: Secondary | ICD-10-CM | POA: Diagnosis not present

## 2018-06-12 ENCOUNTER — Encounter: Payer: Self-pay | Admitting: Family Medicine

## 2018-06-12 ENCOUNTER — Ambulatory Visit (INDEPENDENT_AMBULATORY_CARE_PROVIDER_SITE_OTHER): Payer: Medicare Other | Admitting: Family Medicine

## 2018-06-12 ENCOUNTER — Other Ambulatory Visit: Payer: Self-pay | Admitting: Emergency Medicine

## 2018-06-12 VITALS — BP 130/60 | Ht 60.0 in | Wt 97.0 lb

## 2018-06-12 DIAGNOSIS — G894 Chronic pain syndrome: Secondary | ICD-10-CM

## 2018-06-12 DIAGNOSIS — M7918 Myalgia, other site: Secondary | ICD-10-CM

## 2018-06-12 DIAGNOSIS — M79604 Pain in right leg: Secondary | ICD-10-CM

## 2018-06-12 DIAGNOSIS — M159 Polyosteoarthritis, unspecified: Secondary | ICD-10-CM

## 2018-06-12 DIAGNOSIS — M150 Primary generalized (osteo)arthritis: Secondary | ICD-10-CM | POA: Diagnosis not present

## 2018-06-12 DIAGNOSIS — M545 Low back pain, unspecified: Secondary | ICD-10-CM

## 2018-06-12 DIAGNOSIS — G8929 Other chronic pain: Secondary | ICD-10-CM

## 2018-06-12 DIAGNOSIS — M79605 Pain in left leg: Secondary | ICD-10-CM

## 2018-06-12 MED ORDER — DICLOFENAC SODIUM 1 % TD GEL: 2.0000 g | Freq: Four times a day (QID) | TRANSDERMAL | 3 refills | Status: DC

## 2018-06-12 MED ORDER — TRAMADOL HCL 50 MG PO TABS: 50.0000 mg | ORAL_TABLET | Freq: Three times a day (TID) | ORAL | 0 refills | Status: DC | PRN

## 2018-06-12 NOTE — Progress Notes (Signed)
[facility]: Attending Note: I have reviewed the chart, discussed wit the Sports Medicine Fellow. I agree with assessment and treatment plan as detailed in the [place] note. This visit really hampered by the lack of interpreter.  We did the best we could and it sounds like the Voltaren gel was helpful so we will refill that. Greater than 50% of our 25 minute office visit was spent in to establish communication, reviewing medications and chart, with face to face time in counseling and education regarding these issues.

## 2018-06-12 NOTE — Patient Instructions (Addendum)
I suspect the pain that you are having is from arthritis and causing diffuse musculoskeletal pain. Today we will refill your diclofenac gel.  Apply this topically up to 4 times daily I recommend you follow-up with your primary care provider to review all the medications that you are currently taking and discuss any accommodations you may require at home. When you schedule your appointment with your PCP, please be sure to request an interpreter present.

## 2018-06-12 NOTE — Progress Notes (Signed)
  Barbara Dennis - 84 y.o. female MRN [id number]  Date of birth: 11/05/1933    SUBJECTIVE:      Chief Complaint:/ HPI:  The patient is a 84 year old female who presents with bilateral shoulder pain, bilateral leg pain, low back pain, and neck pain.  The patient is accompanied with her daughter.  Both are limited in their ability to speak English; no interpreter present today. she is unable to localize any of her pain.  Her upper extremity pain seems to involve the shoulder to the hand bilaterally, the lumbar spine, and her bilateral lower legs from the knee to the foot.  She denies any focal symptoms.  Overall her pain has been ongoing for many years.  When discussing her medications, it is unclear which medication she is currently taking for pain but her daughter does acknowledge the diclofenac gel was previously helpful.  She denies any previous injury.  She denies any swelling, erythema, or bruising   ROS:     Pertinent review of systems: negative for fever or unusual weight change.   PERTINENT  PMH / PSH FH / / SH:  Past Medical, Surgical, Social, and Family History Reviewed & Updated in the EMR.  Pertinent findings include:  Arthritis, osteoporosis  OBJECTIVE: BP 130/60   Ht 5' (1.524 m)   Wt 97 lb (44 kg)   BMI 18.94 kg/m   Physical Exam:  Vital signs are reviewed.  GEN: Alert and oriented, NAD Pulm: Breathing unlabored PSY: normal mood, congruent affect  MSK: Bilateral shouldesr: No obvious deformity or asymmetry. No bruising. No swelling No TTP. Specifically no TTP over [facility] joint Mild decreased range of motion in forward flexion to about 160 degrees.  Full abduction, IR/ER NV intact distally Special Tests:  - Impingement: Neg Hawkins and Neers.  - Supraspinatous: Negative empty can. Strength normal/symmetric without pain - Infraspinatous/Teres: .Strength normal/symmetric without pain - Subscapularis: Strength normal/symmetric without pain   Bilateral hip:  -  Inspection: No gross deformity - Palpation: No TTP - ROM: No pain with passive range of motion of the hip - Strength: Normal strength. - Special Tests: Negative FABER and FADIR.  Negative logroll  Bilateral knee: - Inspection: Mild arthritic changes.  No swelling, erythema, or bruising - Palpation: no TTP - ROM: full active ROM with flexion and extension in knee - Neuro/vasc: Normal sensation - Special Tests: - LIGAMENTS: negative Lachman's, no MCL or LCL laxity  -- MENISCUS: negative McMurray's    ASSESSMENT & PLAN:  1.  Diffuse musculoskeletal pain likely due to underlying degenerative changes general deconditioning / aging; also has severe osteopenia.  Patient has no focal pain or concerning findings on exam.  It was very difficult today to determine which medication she is is not taking that are listed on her medication review.  Her daughter did mention that Voltaren gel was helpful, will prescribe this again today.  I recommended that she follow-up with her PCP and go through all of her medications and reconciled them in the EHR.  I advised the patient and her daughter to request an interpreter at subsequent visits so that we can provide better care.

## 2018-07-03 ENCOUNTER — Encounter: Payer: Self-pay | Admitting: Emergency Medicine

## 2018-07-03 ENCOUNTER — Other Ambulatory Visit: Payer: Self-pay

## 2018-07-03 ENCOUNTER — Ambulatory Visit: Payer: Medicare Other | Admitting: Emergency Medicine

## 2018-07-03 ENCOUNTER — Other Ambulatory Visit: Payer: Self-pay | Admitting: Emergency Medicine

## 2018-07-03 VITALS — BP 134/71 | HR 86 | Temp 98.3°F | Resp 16 | Wt 94.6 lb

## 2018-07-03 DIAGNOSIS — M79604 Pain in right leg: Secondary | ICD-10-CM

## 2018-07-03 DIAGNOSIS — M545 Low back pain: Secondary | ICD-10-CM | POA: Diagnosis not present

## 2018-07-03 DIAGNOSIS — Z23 Encounter for immunization: Secondary | ICD-10-CM | POA: Diagnosis not present

## 2018-07-03 DIAGNOSIS — R1084 Generalized abdominal pain: Secondary | ICD-10-CM | POA: Diagnosis not present

## 2018-07-03 DIAGNOSIS — G8929 Other chronic pain: Secondary | ICD-10-CM

## 2018-07-03 DIAGNOSIS — M7918 Myalgia, other site: Secondary | ICD-10-CM

## 2018-07-03 DIAGNOSIS — M79605 Pain in left leg: Secondary | ICD-10-CM

## 2018-07-03 MED ORDER — OMEPRAZOLE 20 MG PO CPDR: 20.0000 mg | DELAYED_RELEASE_CAPSULE | Freq: Every day | ORAL | 3 refills | Status: DC

## 2018-07-03 MED ORDER — RANITIDINE HCL 300 MG PO TABS: 300.0000 mg | ORAL_TABLET | Freq: Every day | ORAL | 1 refills | Status: DC

## 2018-07-03 MED ORDER — TRAMADOL HCL 50 MG PO TABS: 50.0000 mg | ORAL_TABLET | Freq: Three times a day (TID) | ORAL | 1 refills | Status: DC | PRN

## 2018-07-03 NOTE — Patient Instructions (Addendum)
   If you have lab work done today you will be contacted with your lab results within the next 2 weeks.  If you have not heard from us then please contact us. The fastest way to get your results is to register for My Chart.   IF you received an x-ray today, you will receive an invoice from [facility]. Please contact [facility] at [phone] with questions or concerns regarding your invoice.   IF you received labwork today, you will receive an invoice from [facility]. Please contact [facility] at [phone] with questions or concerns regarding your invoice.   Our billing staff will not be able to assist you with questions regarding bills from these companies.  You will be contacted with the lab results as soon as they are available. The fastest way to get your results is to activate your My Chart account. Instructions are located on the last page of this paperwork. If you have not heard from us regarding the results in 2 weeks, please contact this office.     Chronic Pain, Adult Chronic pain is a type of pain that lasts or keeps coming back (recurs) for at least six months. You may have chronic headaches, abdominal pain, or body pain. Chronic pain may be related to an illness, such as fibromyalgia or complex regional pain syndrome. Sometimes the cause of chronic pain is not known. Chronic pain can make it hard for you to do daily activities. If not treated, chronic pain can lead to other health problems, including anxiety and depression. Treatment depends on the cause and severity of your pain. You may need to work with a pain specialist to come up with a treatment plan. The plan may include medicine, counseling, and physical therapy. Many people benefit from a combination of two or more types of treatment to control their pain. Follow these instructions at home: Lifestyle  Consider keeping a pain diary to share with your health care providers.  Consider talking with a  mental health care provider (psychologist) about how to cope with chronic pain.  Consider joining a chronic pain support group.  Try to control or lower your stress levels. Talk to your health care provider about strategies to do this. General instructions   Take over-the-counter and prescription medicines only as told by your health care provider.  Follow your treatment plan as told by your health care provider. This may include: ? Gentle, regular exercise. ? Eating a healthy diet that includes foods such as vegetables, fruits, fish, and lean meats. ? Cognitive or behavioral therapy. ? Working with a physical therapist. ? Meditation or yoga. ? Acupuncture or massage therapy. ? Aroma, color, light, or sound therapy. ? Local electrical stimulation. ? Shots (injections) of numbing or pain-relieving medicines into the spine or the area of pain.  Check your pain level as told by your health care provider. Ask your health care provider if you should use a pain scale.  Learn as much as you can about how to manage your chronic pain. Ask your health care provider if an intensive pain rehabilitation program or a chronic pain specialist would be helpful.  Keep all follow-up visits as told by your health care provider. This is important. Contact a health care provider if:  Your pain gets worse.  You have new pain.  You have trouble sleeping.  You have trouble doing your normal activities.  Your pain is not controlled with treatment.  Your have side effects from pain medicine.    You feel weak. Get help right away if:  You lose feeling or have numbness in your body.  You lose control of bowel or bladder function.  Your pain suddenly gets much worse.  You develop shaking or chills.  You develop confusion.  You develop chest pain.  You have trouble breathing or shortness of breath.  You pass out.  You have thoughts about hurting yourself or others. This information is not  intended to replace advice given to you by your health care provider. Make sure you discuss any questions you have with your health care provider. Document Released: 06/29/2002 Document Revised: 06/06/2016 Document Reviewed: 03/26/2016 Elsevier Interactive Patient Education  2018 Elsevier Inc.  

## 2018-07-03 NOTE — Progress Notes (Signed)
Barbara Dennis 84 y.o.   Chief Complaint  Patient presents with  . Generalized Body Aches    long time - back and feet  . Medication Refill    tramadol, ranitidine and omeprazole    HISTORY OF PRESENT ILLNESS: This is a 84 y.o. female complaining of chronic generalized body aches.  No new symptoms.  Needs medication refills.  HPI   Prior to Admission medications   Medication Sig Start Date End Date Taking? Authorizing Provider  acetaminophen (TYLENOL ARTHRITIS PAIN) 650 MG CR tablet Take 1 tablet (650 mg total) by mouth every 12 (twelve) hours. For joint pain 02/22/16  Yes Shawnee Knapp, MD  Calcium Carbonate Antacid (TUMS PO) Take by mouth.   Yes [provider]  lisinopril (PRINIVIL,ZESTRIL) 10 MG tablet TAKE 1 TABLET(10 MG) BY MOUTH DAILY 07/15/17  Yes Jhene Westmoreland, Ines [place]er, MD  omeprazole (PRILOSEC) 20 MG capsule Take 1 capsule (20 mg total) by mouth daily. 05/01/18  Yes Evangelynn Lochridge, Ines [place], MD  polyethylene glycol powder (GLYCOLAX/MIRALAX) powder Take 17 g by mouth 2 (two) times daily as needed. 02/27/17  Yes English, Colletta Maryland D, PA  tiZANidine (ZANAFLEX) 2 MG tablet Take 1 tablet (2 mg total) by mouth at bedtime. 02/22/16  Yes Shawnee Knapp, MD  traMADol (ULTRAM) 50 MG tablet Take 1 tablet (50 mg total) by mouth every 8 (eight) hours as needed for severe pain. 06/12/18  Yes Draven Natter, Ines [place]er, MD  traZODone (DESYREL) 50 MG tablet Take 0.5-1 tablets (25-50 mg total) by mouth at bedtime as needed for sleep. 03/04/18  Yes Margan Elias, Ines [place]er, MD  albuterol (PROVENTIL HFA;VENTOLIN HFA) 108 (90 Base) MCG/ACT inhaler Inhale 2 puffs into the lungs every 4 (four) hours as needed for wheezing or shortness of breath (cough, shortness of breath or wheezing.). Patient not taking: Reported on 07/03/2018 01/21/17   Ivar Drape D, PA  diclofenac sodium (VOLTAREN) 1 % GEL Apply 2 g topically 4 (four) times daily. 06/12/18   Coralyn Helling, DO  fexofenadine (ALLEGRA) 30 MG tablet Take 30  mg by mouth 2 (two) times daily.    [provider]  gabapentin (NEURONTIN) 100 MG capsule Take 1 capsule (100 mg total) by mouth at bedtime. Patient not taking: Reported on 05/01/2018 09/23/17   Thurman Coyer, DO  Glucos-Chond-Hyal Ac-Ca Fructo (MOVE FREE JOINT HEALTH ADVANCE) TABS Take 1 tablet by mouth 2 (two) times daily. Or as instructed by packaging Patient not taking: Reported on 05/01/2018 02/22/16   Shawnee Knapp, MD  MELATONIN PO Take by mouth.    [provider]  meloxicam (MOBIC) 7.5 MG tablet Take 1 tablet (7.5 mg total) by mouth daily. Patient not taking: Reported on 03/04/2018 02/10/17   Ivar Drape D, PA  methocarbamol (ROBAXIN) 500 MG tablet TAKE 1 TABLET(500 MG) BY MOUTH AT BEDTIME AS NEEDED FOR MUSCLE SPASMS 04/18/17   English, Colletta Maryland D, PA  Misc Natural Products (OSTEO BI-FLEX TRIPLE STRENGTH PO) Take by mouth.    [provider]  ranitidine (ZANTAC) 300 MG tablet Take 1 tablet (300 mg total) by mouth at bedtime for 14 days. 05/01/18 05/15/18  Horald Pollen, MD  varenicline (CHANTIX) 0.5 MG tablet Take 1 tablet (0.5 mg total) by mouth 2 (two) times daily. Patient not taking: Reported on 03/31/2018 08/20/17   Horald Pollen, MD    No Known Allergies  Patient Active Problem List   Diagnosis Date Noted  . Generalized abdominal pain 05/01/2018  . Chronic pain syndrome  01/07/2018  . Chronic obstructive pulmonary disease ([place]) 08/20/2017  . Multinodular goiter 08/15/2017  . Musculoskeletal pain 07/15/2017  . Bilateral leg pain 07/15/2017  . Chronic bilateral low back pain without sciatica 07/15/2017  . Osteopenia of multiple sites 07/15/2017  . Essential hypertension, benign 07/15/2017  . Hypercalcemia 07/15/2017  . Depression with anxiety 01/06/2015  . 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 HTN, goal below 150/90 06/17/2013  . DJD (degenerative joint disease) 02/16/2013  . Tobacco user 09/23/2012  . Menopause 08/21/2012    . Postmenopausal atrophic vaginitis 08/21/2012    Past Medical History:  Diagnosis Date  . Hypertension   . Mass of right side of neck     Past Surgical History:  Procedure Laterality Date  . APPENDECTOMY      Social History   Socioeconomic History  . Marital status: Widowed    Spouse name: Not on file  . Number of children: Not on file  . Years of education: Not on file  . Highest education level: Not on file  Occupational History  . Not on file  Social Needs  . Financial resource strain: Not on file  . Food insecurity:    Worry: Not on file    Inability: Not on file  . Transportation needs:    Medical: Not on file    Non-medical: Not on file  Tobacco Use  . Smoking status: Current Every Day Smoker    Packs/day: 0.70    Years: 60.00    Pack years: 42.00    Types: Cigarettes  . Smokeless tobacco: Never Used  Substance and Sexual Activity  . Alcohol use: No    Alcohol/week: 0.0 standard drinks  . Drug use: No  . Sexual activity: Not on file  Lifestyle  . Physical activity:    Days per week: Not on file    Minutes per session: Not on file  . Stress: Not on file  Relationships  . Social connections:    Talks on phone: Not on file    Gets together: Not on file    Attends religious service: Not on file    Active member of club or organization: Not on file    Attends meetings of clubs or organizations: Not on file    Relationship status: Not on file  . Intimate partner violence:    Fear of current or ex partner: Not on file    Emotionally abused: Not on file    Physically abused: Not on file    Forced sexual activity: Not on file  Other Topics Concern  . Not on file  Social History Narrative   Marital status: widowed 28 years ago.  From [place]; moved to [place] 1974.     Children: none       Lives:  Alone; brother in law is Psychologist, sport and exercise who is 70.      Employment: retired.       Tobacco:  1/2 ppd       Alcohol:  None      Exercise:  Walking daily.      ADLs:  no driving.    Family History  Problem Relation Age of Onset  . Hypercalcemia Neg Hx      Review of Systems  Constitutional: Negative.  Negative for chills and fever.  HENT: Negative.  Negative for sore throat.   Eyes: Negative for blurred vision and double vision.  Respiratory: Negative for cough and shortness of breath.   Cardiovascular: Negative for chest pain and  palpitations.  Gastrointestinal: Negative for abdominal pain, diarrhea, nausea and vomiting.  Musculoskeletal: Positive for back pain and joint pain.  Skin: Negative.  Negative for rash.  Neurological: Negative.  Negative for dizziness and headaches.  Endo/Heme/Allergies: Negative.   All other systems reviewed and are negative.  Vitals:   07/03/18 1354  BP: 134/71  Pulse: 86  Resp: 16  Temp: 98.3 F (36.8 C)  SpO2: 96%     Physical Exam  Constitutional: She is oriented to person, place, and time. She appears well-developed and well-nourished.  HENT:  Head: Normocephalic and atraumatic.  Eyes: Pupils are equal, round, and reactive to light. EOM are normal.  Neck: Normal range of motion. Neck supple.  Cardiovascular: Normal rate, regular rhythm and normal heart sounds.  Pulmonary/Chest: Effort normal and breath sounds normal.  Abdominal: Soft. She exhibits no distension. There is no tenderness.  Musculoskeletal: Normal range of motion.  Neurological: She is alert and oriented to person, place, and time. No sensory deficit. She exhibits normal muscle tone.  Skin: Skin is warm and dry. Capillary refill takes less than 2 seconds.  Psychiatric: She has a normal mood and affect. Her behavior is normal.  Vitals reviewed.  A total of 25 minutes was spent in the room with the patient, greater than 50% of which was in counseling/coordination of care regarding diagnosis, treatment, medications, and need for follow-up.   ASSESSMENT & PLAN: Barbara Dennis was seen today for generalized body aches and medication  refill.  Diagnoses and all orders for this visit:  Musculoskeletal pain -     traMADol (ULTRAM) 50 MG tablet; Take 1 tablet (50 mg total) by mouth every 8 (eight) hours as needed for severe pain.  Need for prophylactic vaccination and inoculation against influenza -     Flu Vaccine QUAD 36+ mos IM  Bilateral leg pain -     traMADol (ULTRAM) 50 MG tablet; Take 1 tablet (50 mg total) by mouth every 8 (eight) hours as needed for severe pain.  Chronic bilateral low back pain without sciatica -     traMADol (ULTRAM) 50 MG tablet; Take 1 tablet (50 mg total) by mouth every 8 (eight) hours as needed for severe pain.  Generalized abdominal pain -     ranitidine (ZANTAC) 300 MG tablet; Take 1 tablet (300 mg total) by mouth at bedtime for 14 days. -     omeprazole (PRILOSEC) 20 MG capsule; Take 1 capsule (20 mg total) by mouth daily.    Patient Instructions       If you have lab work done today you will be contacted with your lab results within the next 2 weeks.  If you have not heard from [place] then please contact us. The fastest way to get your results is to register for My Chart.   IF you received an x-ray today, you will receive an invoice from [facility]. Please contact [facility] at [phone] with questions or concerns regarding your invoice.   IF you received labwork today, you will receive an invoice from [facility]. Please contact [facility] at [phone] with questions or concerns regarding your invoice.   Our billing staff will not be able to assist you with questions regarding bills from these companies.  You will be contacted with the lab results as soon as they are available. The fastest way to get your results is to activate your My Chart account. Instructions are located on the last page of this paperwork. If you have not heard from  [place] regarding the results in 2 weeks, please contact this office.     Chronic Pain, Adult Chronic pain is a type of pain  that lasts or keeps coming back (recurs) for at least six months. You may have chronic headaches, abdominal pain, or body pain. Chronic pain may be related to an illness, such as fibromyalgia or complex regional pain syndrome. Sometimes the cause of chronic pain is not known. Chronic pain can make it hard for you to do daily activities. If not treated, chronic pain can lead to other health problems, including anxiety and depression. Treatment depends on the cause and severity of your pain. You may need to work with a pain specialist to come up with a treatment plan. The plan may include medicine, counseling, and physical therapy. Many people benefit from a combination of two or more types of treatment to control their pain. Follow these instructions at home: Lifestyle  Consider keeping a pain diary to share with your health care providers.  Consider talking with a mental health care provider (psychologist) about how to cope with chronic pain.  Consider joining a chronic pain support group.  Try to control or lower your stress levels. Talk to your health care provider about strategies to do this. General instructions   Take over-the-counter and prescription medicines only as told by your health care provider.  Follow your treatment plan as told by your health care provider. This may include: ? Gentle, regular exercise. ? Eating a healthy diet that includes foods such as vegetables, fruits, fish, and lean meats. ? Cognitive or behavioral therapy. ? Working with a Community education officer. ? Meditation or yoga. ? Acupuncture or massage therapy. ? Aroma, color, light, or sound therapy. ? Local electrical stimulation. ? Shots (injections) of numbing or pain-relieving medicines into the spine or the area of pain.  Check your pain level as told by your health care provider. Ask your health care provider if you should use a pain scale.  Learn as much as you can about how to manage your chronic pain.  Ask your health care provider if an intensive pain rehabilitation program or a chronic pain specialist would be helpful.  Keep all follow-up visits as told by your health care provider. This is important. Contact a health care provider if:  Your pain gets worse.  You have new pain.  You have trouble sleeping.  You have trouble doing your normal activities.  Your pain is not controlled with treatment.  Your have side effects from pain medicine.  You feel weak. Get help right away if:  You lose feeling or have numbness in your body.  You lose control of bowel or bladder function.  Your pain suddenly gets much worse.  You develop shaking or chills.  You develop confusion.  You develop chest pain.  You have trouble breathing or shortness of breath.  You pass out.  You have thoughts about hurting yourself or others. This information is not intended to replace advice given to you by your health care provider. Make sure you discuss any questions you have with your health care provider. Document Released: 06/29/2002 Document Revised: 06/06/2016 Document Reviewed: 03/26/2016 Elsevier Interactive Patient Education  2018 Elsevier Inc.      Agustina Caroli, MD [facility]

## 2018-07-03 NOTE — Telephone Encounter (Signed)
Patient is requesting a 90 day supply.  Zantac refill Last OV:07/03/18 Last refill:07/03/18 #14/1 refill EZB:[id number] Pharmacy: [facility] [place], [place] AT [facility] [address] [phone] (Phone) [phone] (Fax)

## 2018-07-13 ENCOUNTER — Institutional Professional Consult (permissible substitution): Payer: Medicare Other | Admitting: Emergency Medicine

## 2018-07-14 ENCOUNTER — Telehealth: Payer: Self-pay | Admitting: Emergency Medicine

## 2018-07-14 ENCOUNTER — Ambulatory Visit (HOSPITAL_COMMUNITY)
Admission: RE | Admit: 2018-07-14 | Discharge: 2018-07-14 | Disposition: A | Discharge status: Home / Self Care | Payer: Medicare Other | Source: Ambulatory Visit | Attending: Emergency Medicine | Admitting: Emergency Medicine

## 2018-07-14 DIAGNOSIS — I77811 Abdominal aortic ectasia: Secondary | ICD-10-CM | POA: Diagnosis not present

## 2018-07-14 DIAGNOSIS — R1084 Generalized abdominal pain: Secondary | ICD-10-CM | POA: Diagnosis not present

## 2018-07-14 DIAGNOSIS — R112 Nausea with vomiting, unspecified: Secondary | ICD-10-CM | POA: Diagnosis not present

## 2018-07-14 LAB — POCT I-STAT CREATININE: Creatinine, Ser: 0.8 mg/dL (ref 0.44–1.00)

## 2018-07-14 MED ORDER — IOHEXOL 300 MG/ML  SOLN
100.0000 mL | Freq: Once | INTRAMUSCULAR | Status: AC | PRN
  Administered 2018-07-14: 100 mL via INTRAVENOUS

## 2018-07-14 NOTE — Telephone Encounter (Signed)
Copied from [place] [id number] [phone]. Topic: General - Other >> Jul 14, 2018  3:37 PM Yvette Rack wrote: Reason for CRM: Micah called in regarding an I-stat creatinine. Cb#  [phone]

## 2018-07-15 ENCOUNTER — Encounter: Payer: Self-pay | Admitting: *Deleted

## 2018-07-16 ENCOUNTER — Encounter: Payer: Self-pay | Admitting: Gastroenterology

## 2018-07-16 ENCOUNTER — Ambulatory Visit (INDEPENDENT_AMBULATORY_CARE_PROVIDER_SITE_OTHER): Payer: Medicare Other | Admitting: Gastroenterology

## 2018-07-16 VITALS — BP 120/60 | HR 83 | Ht 60.0 in | Wt 94.0 lb

## 2018-07-16 DIAGNOSIS — R110 Nausea: Secondary | ICD-10-CM

## 2018-07-16 DIAGNOSIS — R112 Nausea with vomiting, unspecified: Secondary | ICD-10-CM | POA: Insufficient documentation

## 2018-07-16 DIAGNOSIS — K219 Gastro-esophageal reflux disease without esophagitis: Secondary | ICD-10-CM | POA: Insufficient documentation

## 2018-07-16 NOTE — Patient Instructions (Signed)
If you are age 82 or older, your body mass index should be between 23-30. Your Body mass index is 18.36 kg/m. If this is out of the aforementioned range listed, please consider follow up with your Primary Care Provider.  If you are age 71 or younger, your body mass index should be between 19-25. Your Body mass index is 18.36 kg/m. If this is out of the aformentioned range listed, please consider follow up with your Primary Care Provider.   You have been scheduled for an endoscopy. Please follow written instructions given to you at your visit today. If you use inhalers (even only as needed), please bring them with you on the day of your procedure. Your physician has requested that you go to www.startemmi.com and enter the access code given to you at your visit today. This web site gives a general overview about your procedure. However, you should still follow specific instructions given to you by our office regarding your preparation for the procedure.  Thank you for choosing me and [facility].   Alonza Bogus, PA-C

## 2018-07-16 NOTE — Telephone Encounter (Signed)
Given to [place]

## 2018-07-16 NOTE — Progress Notes (Signed)
07/16/2018 XIMENA TODARO [id number] 1934/01/22   HISTORY OF PRESENT ILLNESS: This is an 84 year old [place] female who speaks mostly [place].  She is new to our office.  She presents here today with a translator, but even with the help of the translator the history is very difficult to obtain as she seems like a very poor historian in general and apparently lives alone.  Anyway, she is here at the request of her PCP, Dr. Mitchel Honour, for evaluation of abdominal pain and other GI issues.  From what I can gather she is complaining of nausea often in the mornings.  Complaining of stomach pain and heartburn/acid reflux.  She reports poor appetite.  She says she is lost maybe 3 to 4 pounds.  Denies dysphasia.  It appears that she was prescribed Zantac 300 mg at bedtime 2 weeks ago and she also has omeprazole 20 mg daily on her list, but we could not confirm whether she was really taking this and for how long.  She is also reporting right lower quadrant abdominal pain in the area of 2 scars she has from previous surgeries.  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  CBC, LFTs, lipase all within normal limits in June when they were last checked.  She does have mobic on her medication list as well, but once again it is difficult to know exactly what she is taking.    They asked about a colonoscopy.  I said that it would be not be recommended at her age for screening.  None of her complaints indicate any lower GI issue and CT scan did not indicate any issues in the colon.    Past Medical History:  Diagnosis Date  . Hypertension   . Mass of right side of neck    Past Surgical History:  Procedure Laterality Date  . APPENDECTOMY      reports that she has been smoking cigarettes. She has a 42.00 pack-year smoking history. She has never used  smokeless tobacco. She reports that she does not drink alcohol or use drugs. family history is not on file. No Known Allergies    Outpatient Encounter Medications as of 07/16/2018  Medication Sig  . acetaminophen (TYLENOL ARTHRITIS PAIN) 650 MG CR tablet Take 1 tablet (650 mg total) by mouth every 12 (twelve) hours. For joint pain  . albuterol (PROVENTIL HFA;VENTOLIN HFA) 108 (90 Base) MCG/ACT inhaler Inhale 2 puffs into the lungs every 4 (four) hours as needed for wheezing or shortness of breath (cough, shortness of breath or wheezing.).  Marland Kitchen Calcium Carbonate Antacid (TUMS PO) Take by mouth.  . diclofenac sodium (VOLTAREN) 1 % GEL Apply 2 g topically 4 (four) times daily.  . fexofenadine (ALLEGRA) 30 MG tablet Take 30 mg by mouth 2 (two) times daily.  Marland Kitchen gabapentin (NEURONTIN) 100 MG capsule Take 1 capsule (100 mg total) by mouth at bedtime.  . Glucos-Chond-Hyal Ac-Ca Fructo (MOVE FREE JOINT HEALTH ADVANCE) TABS Take 1 tablet by mouth 2 (two) times daily. Or as instructed by packaging  . lisinopril (PRINIVIL,ZESTRIL) 10 MG tablet TAKE 1 TABLET(10 MG) BY MOUTH DAILY  . MELATONIN PO Take by mouth.  . meloxicam (MOBIC) 7.5 MG tablet Take 1 tablet (7.5 mg total) by mouth daily.  . methocarbamol (ROBAXIN) 500 MG tablet TAKE 1 TABLET(500 MG) BY MOUTH AT BEDTIME  AS NEEDED FOR MUSCLE SPASMS  . Misc Natural Products (OSTEO BI-FLEX TRIPLE STRENGTH PO) Take by mouth.  Marland Kitchen omeprazole (PRILOSEC) 20 MG capsule Take 1 capsule (20 mg total) by mouth daily.  . polyethylene glycol powder (GLYCOLAX/MIRALAX) powder Take 17 g by mouth 2 (two) times daily as needed.  . ranitidine (ZANTAC) 300 MG tablet Take 1 tablet (300 mg total) by mouth at bedtime for 14 days.  Marland Kitchen tiZANidine (ZANAFLEX) 2 MG tablet Take 1 tablet (2 mg total) by mouth at bedtime.  . traMADol (ULTRAM) 50 MG tablet Take 1 tablet (50 mg total) by mouth every 8 (eight) hours as needed for severe pain.  . traZODone (DESYREL) 50 MG tablet Take 0.5-1 tablets  (25-50 mg total) by mouth at bedtime as needed for sleep.  . varenicline (CHANTIX) 0.5 MG tablet Take 1 tablet (0.5 mg total) by mouth 2 (two) times daily.   No facility-administered encounter medications on file as of 07/16/2018.      REVIEW OF SYSTEMS  : All other systems reviewed and negative except where noted in the History of Present Illness.   PHYSICAL EXAM: BP 120/60   Pulse 83   Ht 5' (1.524 m)   Wt 94 lb (42.6 kg)   BMI 18.36 kg/m  General: Well developed Asian female in no acute distress Head: Normocephalic and atraumatic Eyes:  Sclerae anicteric, conjunctiva pink. Ears: Normal auditory acuity Lungs: Clear throughout to auscultation; no increased WOB. Heart: Regular rate and rhythm; no M/R/G. Abdomen: Soft, non-distended.  BS present.  Non-tender. Musculoskeletal: Symmetrical with no gross deformities  Skin: No lesions on visible extremities Extremities: No edema  Neurological: Alert oriented x 4, grossly non-focal Psychological:  Alert and cooperative. Normal mood and affect  ASSESSMENT AND PLAN: *83 year old Asian female with mostly upper GI complaints of poor appetite, nausea, acid reflux, epigastric pain.  Reports only about a 3 to 4 pound weight loss.  It appears that she is on omeprazole 20 mg daily and Zantac 300 mg at bedtime, but not really quite sure if she is taking the omeprazole.  Very difficult to obtain history even with translator.  Anyway, recent CT scan abdomen and pelvis ok.  Will plan for EGD with Dr. Tarri Glenn.  **The risks, benefits, and alternatives to EGD were discussed with the patient and she consents to proceed.   CC:  Horald Pollen, [place]

## 2018-07-17 NOTE — Progress Notes (Signed)
I agree with the above note, plan 

## 2018-07-19 ENCOUNTER — Other Ambulatory Visit: Payer: Self-pay

## 2018-07-19 ENCOUNTER — Inpatient Hospital Stay (HOSPITAL_COMMUNITY): Payer: Medicare Other

## 2018-07-19 ENCOUNTER — Inpatient Hospital Stay (HOSPITAL_COMMUNITY)
Admission: EM | Admit: 2018-07-19 | Discharge: 2018-07-23 | DRG: 184 | Disposition: A | Discharge status: Skilled Nursing Facility | Payer: Medicare Other | Attending: Surgery | Admitting: Surgery

## 2018-07-19 ENCOUNTER — Emergency Department (HOSPITAL_COMMUNITY): Payer: Medicare Other

## 2018-07-19 ENCOUNTER — Encounter (HOSPITAL_COMMUNITY): Payer: Self-pay

## 2018-07-19 DIAGNOSIS — I10 Essential (primary) hypertension: Secondary | ICD-10-CM | POA: Diagnosis present

## 2018-07-19 DIAGNOSIS — R269 Unspecified abnormalities of gait and mobility: Secondary | ICD-10-CM | POA: Diagnosis present

## 2018-07-19 DIAGNOSIS — R52 Pain, unspecified: Secondary | ICD-10-CM

## 2018-07-19 DIAGNOSIS — E871 Hypo-osmolality and hyponatremia: Secondary | ICD-10-CM | POA: Diagnosis present

## 2018-07-19 DIAGNOSIS — G894 Chronic pain syndrome: Secondary | ICD-10-CM | POA: Diagnosis not present

## 2018-07-19 DIAGNOSIS — Y92019 Unspecified place in single-family (private) house as the place of occurrence of the external cause: Secondary | ICD-10-CM | POA: Diagnosis not present

## 2018-07-19 DIAGNOSIS — R0902 Hypoxemia: Secondary | ICD-10-CM | POA: Diagnosis not present

## 2018-07-19 DIAGNOSIS — F17210 Nicotine dependence, cigarettes, uncomplicated: Secondary | ICD-10-CM | POA: Diagnosis present

## 2018-07-19 DIAGNOSIS — R402252 Coma scale, best verbal response, oriented, at arrival to emergency department: Secondary | ICD-10-CM | POA: Diagnosis present

## 2018-07-19 DIAGNOSIS — R2681 Unsteadiness on feet: Secondary | ICD-10-CM | POA: Diagnosis not present

## 2018-07-19 DIAGNOSIS — D62 Acute posthemorrhagic anemia: Secondary | ICD-10-CM | POA: Diagnosis not present

## 2018-07-19 DIAGNOSIS — W1830XA Fall on same level, unspecified, initial encounter: Secondary | ICD-10-CM | POA: Diagnosis present

## 2018-07-19 DIAGNOSIS — S22089A Unspecified fracture of T11-T12 vertebra, initial encounter for closed fracture: Secondary | ICD-10-CM | POA: Diagnosis not present

## 2018-07-19 DIAGNOSIS — M6281 Muscle weakness (generalized): Secondary | ICD-10-CM | POA: Diagnosis not present

## 2018-07-19 DIAGNOSIS — J449 Chronic obstructive pulmonary disease, unspecified: Secondary | ICD-10-CM | POA: Diagnosis not present

## 2018-07-19 DIAGNOSIS — M8580 Other specified disorders of bone density and structure, unspecified site: Secondary | ICD-10-CM | POA: Diagnosis not present

## 2018-07-19 DIAGNOSIS — R109 Unspecified abdominal pain: Secondary | ICD-10-CM | POA: Diagnosis not present

## 2018-07-19 DIAGNOSIS — R402362 Coma scale, best motor response, obeys commands, at arrival to emergency department: Secondary | ICD-10-CM | POA: Diagnosis not present

## 2018-07-19 DIAGNOSIS — R1314 Dysphagia, pharyngoesophageal phase: Secondary | ICD-10-CM | POA: Diagnosis not present

## 2018-07-19 DIAGNOSIS — N179 Acute kidney failure, unspecified: Secondary | ICD-10-CM | POA: Diagnosis not present

## 2018-07-19 DIAGNOSIS — K219 Gastro-esophageal reflux disease without esophagitis: Secondary | ICD-10-CM | POA: Diagnosis not present

## 2018-07-19 DIAGNOSIS — R402142 Coma scale, eyes open, spontaneous, at arrival to emergency department: Secondary | ICD-10-CM | POA: Diagnosis present

## 2018-07-19 DIAGNOSIS — S2231XA Fracture of one rib, right side, initial encounter for closed fracture: Secondary | ICD-10-CM | POA: Diagnosis not present

## 2018-07-19 DIAGNOSIS — S22080A Wedge compression fracture of T11-T12 vertebra, initial encounter for closed fracture: Secondary | ICD-10-CM | POA: Diagnosis not present

## 2018-07-19 DIAGNOSIS — S22089D Unspecified fracture of T11-T12 vertebra, subsequent encounter for fracture with routine healing: Secondary | ICD-10-CM | POA: Diagnosis not present

## 2018-07-19 DIAGNOSIS — S2241XA Multiple fractures of ribs, right side, initial encounter for closed fracture: Secondary | ICD-10-CM | POA: Diagnosis not present

## 2018-07-19 DIAGNOSIS — S3991XA Unspecified injury of abdomen, initial encounter: Secondary | ICD-10-CM | POA: Diagnosis not present

## 2018-07-19 DIAGNOSIS — R0781 Pleurodynia: Secondary | ICD-10-CM | POA: Diagnosis not present

## 2018-07-19 DIAGNOSIS — S2241XD Multiple fractures of ribs, right side, subsequent encounter for fracture with routine healing: Secondary | ICD-10-CM | POA: Diagnosis not present

## 2018-07-19 DIAGNOSIS — Z9181 History of falling: Secondary | ICD-10-CM | POA: Diagnosis not present

## 2018-07-19 DIAGNOSIS — S2249XA Multiple fractures of ribs, unspecified side, initial encounter for closed fracture: Secondary | ICD-10-CM | POA: Diagnosis present

## 2018-07-19 DIAGNOSIS — M8589 Other specified disorders of bone density and structure, multiple sites: Secondary | ICD-10-CM | POA: Diagnosis not present

## 2018-07-19 LAB — CBC WITH DIFFERENTIAL/PLATELET
ABS IMMATURE GRANULOCYTES: 0.1 10*3/uL (ref 0.0–0.1)
BASOS ABS: 0 10*3/uL (ref 0.0–0.1)
Basophils Relative: 0 %
EOS PCT: 0 %
Eosinophils Absolute: 0 10*3/uL (ref 0.0–0.7)
HEMATOCRIT: 35.6 % — AB (ref 36.0–46.0)
HEMOGLOBIN: 12 g/dL (ref 12.0–15.0)
IMMATURE GRANULOCYTES: 1 %
LYMPHS ABS: 1.2 10*3/uL (ref 0.7–4.0)
LYMPHS PCT: 9 %
MCH: 31.8 pg (ref 26.0–34.0)
MCHC: 33.7 g/dL (ref 30.0–36.0)
MCV: 94.4 fL (ref 78.0–100.0)
Monocytes Absolute: 0.8 10*3/uL (ref 0.1–1.0)
Monocytes Relative: 6 %
NEUTROS ABS: 11.9 10*3/uL — AB (ref 1.7–7.7)
Neutrophils Relative %: 84 %
Platelets: 217 10*3/uL (ref 150–400)
RBC: 3.77 MIL/uL — AB (ref 3.87–5.11)
RDW: 12.7 % (ref 11.5–15.5)
WBC: 14.1 10*3/uL — AB (ref 4.0–10.5)

## 2018-07-19 LAB — COMPREHENSIVE METABOLIC PANEL
ALBUMIN: 3.6 g/dL (ref 3.5–5.0)
ALT: 17 U/L (ref 0–44)
ANION GAP: 12 (ref 5–15)
AST: 21 U/L (ref 15–41)
Alkaline Phosphatase: 110 U/L (ref 38–126)
BUN: 18 mg/dL (ref 8–23)
CHLORIDE: 94 mmol/L — AB (ref 98–111)
CO2: 21 mmol/L — ABNORMAL LOW (ref 22–32)
Calcium: 9.3 mg/dL (ref 8.9–10.3)
Creatinine, Ser: 0.95 mg/dL (ref 0.44–1.00)
GFR calc Af Amer: >60 mL/min (ref >60)
GFR, EST NON AFRICAN AMERICAN: 54 mL/min — AB (ref >60)
Glucose, Bld: 127 mg/dL — ABNORMAL HIGH (ref 70–99)
POTASSIUM: 4.4 mmol/L (ref 3.5–5.1)
Sodium: 127 mmol/L — ABNORMAL LOW (ref 135–145)
Total Bilirubin: 1.1 mg/dL (ref 0.3–1.2)
Total Protein: 7.7 g/dL (ref 6.5–8.1)

## 2018-07-19 LAB — URINALYSIS, ROUTINE W REFLEX MICROSCOPIC
Bilirubin Urine: NEGATIVE
Glucose, UA: NEGATIVE mg/dL
HGB URINE DIPSTICK: NEGATIVE
Ketones, ur: NEGATIVE mg/dL
LEUKOCYTES UA: NEGATIVE
Nitrite: NEGATIVE
PH: 7 (ref 5.0–8.0)
Protein, ur: NEGATIVE mg/dL
SPECIFIC GRAVITY, URINE: 1.01 (ref 1.005–1.030)

## 2018-07-19 LAB — TROPONIN I

## 2018-07-19 MED ORDER — HYDRALAZINE HCL 20 MG/ML IJ SOLN: 10.0000 mg | INTRAMUSCULAR | Status: DC | PRN

## 2018-07-19 MED ORDER — ONDANSETRON HCL 4 MG/2ML IJ SOLN
4.0000 mg | Freq: Once | INTRAMUSCULAR | Status: AC
  Administered 2018-07-19: 4 mg via INTRAVENOUS
  Filled 2018-07-19: qty 2

## 2018-07-19 MED ORDER — ENOXAPARIN SODIUM 40 MG/0.4ML ~~LOC~~ SOLN
40.0000 mg | SUBCUTANEOUS | Status: DC
  Administered 2018-07-19: 40 mg via SUBCUTANEOUS
  Filled 2018-07-19: qty 0.4

## 2018-07-19 MED ORDER — SODIUM CHLORIDE 0.9 % IV SOLN: 250.0000 mL | INTRAVENOUS | Status: DC | PRN

## 2018-07-19 MED ORDER — TRAZODONE HCL 50 MG PO TABS
25.0000 mg | ORAL_TABLET | Freq: Every evening | ORAL | Status: DC | PRN
  Administered 2018-07-20 – 2018-07-21 (×3): 50 mg via ORAL
  Filled 2018-07-19 (×3): qty 1

## 2018-07-19 MED ORDER — IOHEXOL 300 MG/ML  SOLN
100.0000 mL | Freq: Once | INTRAMUSCULAR | Status: AC | PRN
  Administered 2018-07-19: 100 mL via INTRAVENOUS

## 2018-07-19 MED ORDER — TRAMADOL HCL 50 MG PO TABS
50.0000 mg | ORAL_TABLET | Freq: Three times a day (TID) | ORAL | Status: DC | PRN
  Administered 2018-07-20 (×2): 50 mg via ORAL
  Filled 2018-07-19 (×2): qty 1

## 2018-07-19 MED ORDER — SODIUM CHLORIDE 0.9 % IV BOLUS
1000.0000 mL | Freq: Once | INTRAVENOUS | Status: AC
  Administered 2018-07-19: 1000 mL via INTRAVENOUS

## 2018-07-19 MED ORDER — BISMUTH SUBSALICYLATE 262 MG PO CHEW
262.0000 mg | CHEWABLE_TABLET | ORAL | Status: DC | PRN
  Filled 2018-07-19: qty 1

## 2018-07-19 MED ORDER — ACETAMINOPHEN ER 650 MG PO TBCR: 650.0000 mg | EXTENDED_RELEASE_TABLET | Freq: Two times a day (BID) | ORAL | Status: DC | PRN

## 2018-07-19 MED ORDER — MORPHINE SULFATE (PF) 2 MG/ML IV SOLN
1.0000 mg | INTRAVENOUS | Status: DC | PRN
  Administered 2018-07-19 – 2018-07-20 (×4): 1 mg via INTRAVENOUS
  Filled 2018-07-19 (×3): qty 1

## 2018-07-19 MED ORDER — ACETAMINOPHEN 325 MG PO TABS: 325.0000 mg | ORAL_TABLET | Freq: Four times a day (QID) | ORAL | Status: DC | PRN

## 2018-07-19 MED ORDER — SODIUM CHLORIDE 0.9% FLUSH: 3.0000 mL | INTRAVENOUS | Status: DC | PRN

## 2018-07-19 MED ORDER — MORPHINE SULFATE (PF) 4 MG/ML IV SOLN
4.0000 mg | Freq: Once | INTRAVENOUS | Status: AC
  Administered 2018-07-19: 4 mg via INTRAVENOUS
  Filled 2018-07-19: qty 1

## 2018-07-19 MED ORDER — LISINOPRIL 10 MG PO TABS
10.0000 mg | ORAL_TABLET | Freq: Every day | ORAL | Status: DC
  Administered 2018-07-19 – 2018-07-22 (×4): 10 mg via ORAL
  Filled 2018-07-19 (×4): qty 1

## 2018-07-19 MED ORDER — ONDANSETRON 4 MG PO TBDP
4.0000 mg | ORAL_TABLET | Freq: Four times a day (QID) | ORAL | Status: DC | PRN
  Administered 2018-07-23: 4 mg via ORAL
  Filled 2018-07-19: qty 1

## 2018-07-19 MED ORDER — ONDANSETRON HCL 4 MG/2ML IJ SOLN: 4.0000 mg | Freq: Four times a day (QID) | INTRAMUSCULAR | Status: DC | PRN

## 2018-07-19 MED ORDER — PANTOPRAZOLE SODIUM 40 MG PO TBEC
40.0000 mg | DELAYED_RELEASE_TABLET | Freq: Every day | ORAL | Status: DC
  Administered 2018-07-19 – 2018-07-23 (×5): 40 mg via ORAL
  Filled 2018-07-19 (×5): qty 1

## 2018-07-19 MED ORDER — SODIUM CHLORIDE 0.9% FLUSH
3.0000 mL | Freq: Two times a day (BID) | INTRAVENOUS | Status: DC
  Administered 2018-07-19 – 2018-07-23 (×4): 3 mL via INTRAVENOUS

## 2018-07-19 NOTE — ED Provider Notes (Signed)
[facility] EMERGENCY DEPARTMENT Provider Note   CSN: [id number] Arrival date & time: 07/19/18  1400     History   Chief Complaint Chief Complaint  Patient presents with  . Fall    HPI Barbara Dennis is a 84 y.o. female.  Pt presents to the ED today with right rib pain.  Pt and daughter speak [place] and information is obtained via Optometrist.  The pt has a hx of ambulatory dysfunction and falls a lot.  She still lives alone and fell 3 times last night.  She said she can't walk now due to the pain.  She denies loc.  She did not hit her head.  No other injuries.  No blood thinners.     Past Medical History:  Diagnosis Date  . Hypertension   . Mass of right side of neck     Patient Active Problem List   Diagnosis Date Noted  . Multiple rib fractures 07/19/2018  . Nausea 07/16/2018  . Gastroesophageal reflux disease 07/16/2018  . Generalized abdominal pain 05/01/2018  . Chronic pain syndrome 01/07/2018  . Chronic obstructive pulmonary disease ([facility]) 08/20/2017  . Multinodular goiter 08/15/2017  . Musculoskeletal pain 07/15/2017  . Bilateral leg pain 07/15/2017  . Chronic bilateral low back pain without sciatica 07/15/2017  . Osteopenia of multiple sites 07/15/2017  . Essential hypertension, benign 07/15/2017  . Hypercalcemia 07/15/2017  . Depression with anxiety 01/06/2015  . 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 HTN, goal below 150/90 06/17/2013  . DJD (degenerative joint disease) 02/16/2013  . Tobacco user 09/23/2012  . Menopause 08/21/2012  . Postmenopausal atrophic vaginitis 08/21/2012    Past Surgical History:  Procedure Laterality Date  . APPENDECTOMY       OB History   None      Home Medications    Prior to Admission medications   Medication Sig Start Date End Date Taking? Authorizing Provider  Bismuth Subsalicylate (PEPTO-BISMOL PO) Take 1 capsule by mouth daily as needed (constipation).   Yes [provider]    Calcium Carbonate Antacid (TUMS ULTRA 1000 PO) Take 1,000 mg by mouth 2 (two) times daily.   Yes [provider]  lisinopril (PRINIVIL,ZESTRIL) 10 MG tablet TAKE 1 TABLET(10 MG) BY MOUTH DAILY Patient taking differently: Take 10 mg by mouth at bedtime.  07/15/17  Yes Sagardia, Ines Bloomer, MD  Multiple Vitamin (MULTIVITAMIN WITH MINERALS) TABS tablet Take 1 tablet by mouth daily.   Yes [provider]  omeprazole (PRILOSEC) 20 MG capsule Take 1 capsule (20 mg total) by mouth daily. Patient taking differently: Take 20 mg by mouth at bedtime.  07/03/18  Yes Sagardia, Ines Bloomer, MD  traMADol (ULTRAM) 50 MG tablet Take 1 tablet (50 mg total) by mouth every 8 (eight) hours as needed for severe pain. Patient taking differently: Take 50 mg by mouth 2 (two) times daily.  07/03/18  Yes Sagardia, Ines Bloomer, MD  traZODone (DESYREL) 50 MG tablet Take 0.5-1 tablets (25-50 mg total) by mouth at bedtime as needed for sleep. 03/04/18  Yes Sagardia, Ines Bloomer, MD  acetaminophen (TYLENOL ARTHRITIS PAIN) 650 MG CR tablet Take 1 tablet (650 mg total) by mouth every 12 (twelve) hours. For joint pain Patient taking differently: Take 650 mg by mouth every 12 (twelve) hours as needed (joint pain).  02/22/16   Shawnee Knapp, MD  albuterol (PROVENTIL HFA;VENTOLIN HFA) 108 (90 Base) MCG/ACT inhaler Inhale 2 puffs into the lungs every 4 (four) hours as needed  for wheezing or shortness of breath (cough, shortness of breath or wheezing.). Patient not taking: Reported on 07/19/2018 01/21/17   Ivar Drape D, PA  diclofenac sodium (VOLTAREN) 1 % GEL Apply 2 g topically 4 (four) times daily. Patient not taking: Reported on 07/19/2018 06/12/18   Coralyn Helling, DO  gabapentin (NEURONTIN) 100 MG capsule Take 1 capsule (100 mg total) by mouth at bedtime. Patient not taking: Reported on 07/19/2018 09/23/17   Thurman Coyer, DO  Glucos-Chond-Hyal Ac-Ca Fructo (MOVE FREE JOINT HEALTH ADVANCE) TABS Take 1 tablet by  mouth 2 (two) times daily. Or as instructed by packaging Patient not taking: Reported on 07/19/2018 02/22/16   Shawnee Knapp, MD  meloxicam (MOBIC) 7.5 MG tablet Take 1 tablet (7.5 mg total) by mouth daily. Patient not taking: Reported on 07/19/2018 02/10/17   Ivar Drape D, PA  methocarbamol (ROBAXIN) 500 MG tablet TAKE 1 TABLET(500 MG) BY MOUTH AT BEDTIME AS NEEDED FOR MUSCLE SPASMS Patient not taking: Reported on 07/19/2018 04/18/17   Ivar Drape D, PA  polyethylene glycol powder (GLYCOLAX/MIRALAX) powder Take 17 g by mouth 2 (two) times daily as needed. Patient not taking: Reported on 07/19/2018 02/27/17   Ivar Drape D, PA  ranitidine (ZANTAC) 300 MG tablet Take 1 tablet (300 mg total) by mouth at bedtime for 14 days. Patient not taking: Reported on 07/19/2018 07/03/18 07/17/18  Horald Pollen, MD  tiZANidine (ZANAFLEX) 2 MG tablet Take 1 tablet (2 mg total) by mouth at bedtime. Patient not taking: Reported on 07/19/2018 02/22/16   Shawnee Knapp, MD  varenicline (CHANTIX) 0.5 MG tablet Take 1 tablet (0.5 mg total) by mouth 2 (two) times daily. Patient not taking: Reported on 07/19/2018 08/20/17   Horald Pollen, MD    Family History Family History  Problem Relation Age of Onset  . Hypercalcemia Neg Hx     Social History Social History   Tobacco Use  . Smoking status: Current Every Day Smoker    Packs/day: 0.70    Years: 60.00    Pack years: 42.00    Types: Cigarettes  . Smokeless tobacco: Never Used  Substance Use Topics  . Alcohol use: No    Alcohol/week: 0.0 standard drinks  . Drug use: No     Allergies   Patient has no known allergies.   Review of Systems Review of Systems  Musculoskeletal:       Right rib pain  All other systems reviewed and are negative.    Physical Exam Updated Vital Signs BP (!) 171/83   Pulse (!) 101   Temp 97.7 F (36.5 C) (Oral)   Resp (!) 27   SpO2 94%   Physical Exam  Constitutional: She is oriented to  person, place, and time. She appears well-developed and well-nourished.  HENT:  Head: Normocephalic and atraumatic.  Right Ear: External ear normal.  Left Ear: External ear normal.  Nose: Nose normal.  Mouth/Throat: Oropharynx is clear and moist.  Eyes: Pupils are equal, round, and reactive to light. Conjunctivae and EOM are normal.  Neck: Normal range of motion. Neck supple.  Cardiovascular: Normal rate, regular rhythm, normal heart sounds and intact distal pulses.  Pulmonary/Chest: Effort normal and breath sounds normal.  Abdominal: Soft. Bowel sounds are normal.  Musculoskeletal:       Arms: Neurological: She is alert and oriented to person, place, and time.  Skin: Skin is warm and dry. Capillary refill takes less than 2 seconds.  Psychiatric: She has a normal  mood and affect. Her behavior is normal. Judgment and thought content normal.  Nursing note and vitals reviewed.    ED Treatments / Results  Labs (all labs ordered are listed, but only abnormal results are displayed) Labs Reviewed  COMPREHENSIVE METABOLIC PANEL - Abnormal; Notable for the following components:      Result Value   Sodium 127 (*)    Chloride 94 (*)    CO2 21 (*)    Glucose, Bld 127 (*)    GFR calc non Af Amer 54 (*)    All other components within normal limits  CBC WITH DIFFERENTIAL/PLATELET - Abnormal; Notable for the following components:   WBC 14.1 (*)    RBC 3.77 (*)    HCT 35.6 (*)    Neutro Abs 11.9 (*)    All other components within normal limits  TROPONIN I  URINALYSIS, ROUTINE W REFLEX MICROSCOPIC  CBC  BASIC METABOLIC PANEL    EKG EKG Interpretation  Date/Time:  Sunday July 19 2018 16:21:40 EDT Ventricular Rate:  101 PR Interval:    QRS Duration: 109 QT Interval:  367 QTC Calculation: 476 R Axis:   -91 Text Interpretation:  Sinus tachycardia Inferolateral infarct, old Abnormal T, consider ischemia, anterior leads No old tracing to compare Confirmed by Isla Pence  [phone]) on 07/19/2018 4:47:35 PM   Radiology Dg Ribs Unilateral W/chest Right  Result Date: 07/19/2018 CLINICAL DATA:  Right-sided pain after fall EXAM: RIGHT RIBS AND CHEST - 3+ VIEW COMPARISON:  03/31/2018 chest radiograph. FINDINGS: Stable cardiomediastinal silhouette with mild cardiomegaly. No pneumothorax. No pleural effusion. Cephalization of the pulmonary vasculature without overt pulmonary edema. No acute consolidative airspace disease. Acute nondisplaced lateral right third and fourth rib fractures. No suspicious focal osseous lesions. IMPRESSION: Acute nondisplaced lateral right third and fourth rib fractures. No pneumothorax. No hemothorax. Stable mild cardiomegaly without overt pulmonary edema. Electronically Signed   By: Ilona Sorrel M.D.   On: 07/19/2018 18:15    Procedures Procedures (including critical care time)  Medications Ordered in ED Medications  lisinopril (PRINIVIL,ZESTRIL) tablet 10 mg (has no administration in time range)  traZODone (DESYREL) tablet 25-50 mg (has no administration in time range)  traMADol (ULTRAM) tablet 50 mg (has no administration in time range)  acetaminophen (TYLENOL) CR tablet 650 mg (has no administration in time range)  bismuth subsalicylate (PEPTO BISMOL) chewable tablet 262 mg (has no administration in time range)  pantoprazole (PROTONIX) EC tablet 40 mg (has no administration in time range)  enoxaparin (LOVENOX) injection 40 mg (has no administration in time range)  sodium chloride flush (NS) 0.9 % injection 3 mL (has no administration in time range)  sodium chloride flush (NS) 0.9 % injection 3 mL (has no administration in time range)  0.9 %  sodium chloride infusion (has no administration in time range)  morphine 2 MG/ML injection 1 mg (has no administration in time range)  ondansetron (ZOFRAN-ODT) disintegrating tablet 4 mg (has no administration in time range)    Or  ondansetron (ZOFRAN) injection 4 mg (has no administration in time  range)  hydrALAZINE (APRESOLINE) injection 10 mg (has no administration in time range)  morphine 4 MG/ML injection 4 mg (4 mg Intravenous Given 07/19/18 1627)  ondansetron (ZOFRAN) injection 4 mg (4 mg Intravenous Given 07/19/18 1627)  sodium chloride 0.9 % bolus 1,000 mL (0 mLs Intravenous Stopped 07/19/18 1740)     Initial Impression / Assessment and Plan / ED Course  I have reviewed the triage vital signs and  the nursing notes.  Pertinent labs & imaging results that were available during my care of the patient were reviewed by me and considered in my medical decision making (see chart for details).    O2 sats 87% on RA, so I put pt on 2L oxygen via [place].  Xrays show rib fractures to 3rd and 4th ribs.  Pt does have hyponatremia which is chronic.  Urine is pending.  Pt d/w Dr. Brantley Stage (trauma) who will admit for pain control and oxygen requirement.  Pt may need SW consult in AM for placement.  Final Clinical Impressions(s) / ED Diagnoses   Final diagnoses:  Closed fracture of multiple ribs of right side, initial encounter  Intractable pain  Hypoxia    ED Discharge Orders    None       Isla Pence, MD 07/19/18 2001

## 2018-07-19 NOTE — ED Triage Notes (Signed)
Pt presents for evaluation of lower back pain related to 3 falls last night. Pt lives on her own but called daughter and stated she fell and had back pain. Denies LOC. Pt is tachycardic at 106 in triage. Denies recent illness.

## 2018-07-19 NOTE — H&P (Signed)
Barbara Dennis is an 84 y.o. female.   Chief Complaint: Right chest pain after fall HPI: 84 year old female seen by the emergency room physician after a fall last night.  She complains of right-sided chest pain.  X-ray showed 2 rib fractures of the third and fourth rib on the right.  No other work-up was obtained by the EDP.  Patient complains of right-sided chest pain with inspiration.  No shortness of breath.  No nausea or vomiting.  She also complains of some mild right upper quadrant abdominal pain as well.  No rebound or guarding.  She was seen last week the emergency room where CT scan for abdominal pain revealed no abnormality.  She is a heavy smoker.  Past Medical History:  Diagnosis Date  . Hypertension   . Mass of right side of neck     Past Surgical History:  Procedure Laterality Date  . APPENDECTOMY      Family History  Problem Relation Age of Onset  . Hypercalcemia Neg Hx    Social History:  reports that she has been smoking cigarettes. She has a 42.00 pack-year smoking history. She has never used smokeless tobacco. She reports that she does not drink alcohol or use drugs.  Allergies: No Known Allergies   (Not in a hospital admission)  Results for orders placed or performed during the hospital encounter of 07/19/18 (from the past 48 hour(s))  Comprehensive metabolic panel     Status: Abnormal   Collection Time: 07/19/18  4:29 PM  Result Value Ref Range   Sodium 127 (L) 135 - 145 mmol/L   Potassium 4.4 3.5 - 5.1 mmol/L   Chloride 94 (L) 98 - 111 mmol/L   CO2 21 (L) 22 - 32 mmol/L   Glucose, Bld 127 (H) 70 - 99 mg/dL   BUN 18 8 - 23 mg/dL   Creatinine, Ser 0.95 0.44 - 1.00 mg/dL   Calcium 9.3 8.9 - 10.3 mg/dL   Total Protein 7.7 6.5 - 8.1 g/dL   Albumin 3.6 3.5 - 5.0 g/dL   AST 21 15 - 41 U/L   ALT 17 0 - 44 U/L   Alkaline Phosphatase 110 38 - 126 U/L   Total Bilirubin 1.1 0.3 - 1.2 mg/dL   GFR calc non Af Amer 54 (L) >60 mL/min   GFR calc Af Amer >60 >60  mL/min    Comment: (NOTE) The eGFR has been calculated using the CKD EPI equation. This calculation has not been validated in all clinical situations. eGFR's persistently <60 mL/min signify possible Chronic Kidney Disease.    Anion gap 12 5 - 15    Comment: Performed at [facility] [address], [place], [place] [address]  CBC with Differential     Status: Abnormal   Collection Time: 07/19/18  4:29 PM  Result Value Ref Range   WBC 14.1 (H) 4.0 - 10.5 K/uL   RBC 3.77 (L) 3.87 - 5.11 MIL/uL   Hemoglobin 12.0 12.0 - 15.0 g/dL   HCT 35.6 (L) 36.0 - 46.0 %   MCV 94.4 78.0 - 100.0 fL   MCH 31.8 26.0 - 34.0 pg   MCHC 33.7 30.0 - 36.0 g/dL   RDW 12.7 11.5 - 15.5 %   Platelets 217 150 - 400 K/uL   Neutrophils Relative % 84 %   Neutro Abs 11.9 (H) 1.7 - 7.7 K/uL   Lymphocytes Relative 9 %   Lymphs Abs 1.2 0.7 - 4.0 K/uL   Monocytes  Relative 6 %   Monocytes Absolute 0.8 0.1 - 1.0 K/uL   Eosinophils Relative 0 %   Eosinophils Absolute 0.0 0.0 - 0.7 K/uL   Basophils Relative 0 %   Basophils Absolute 0.0 0.0 - 0.1 K/uL   Immature Granulocytes 1 %   Abs Immature Granulocytes 0.1 0.0 - 0.1 K/uL    Comment: Performed at [facility] [address], [place], [place] [address]  Troponin I     Status: None   Collection Time: 07/19/18  4:29 PM  Result Value Ref Range   Troponin I <0.03 <0.03 ng/mL    Comment: Performed at [facility] [address], [place], [place] [address]   Dg Ribs Unilateral W/chest Right  Result Date: 07/19/2018 CLINICAL DATA:  Right-sided pain after fall EXAM: RIGHT RIBS AND CHEST - 3+ VIEW COMPARISON:  03/31/2018 chest radiograph. FINDINGS: Stable cardiomediastinal silhouette with mild cardiomegaly. No pneumothorax. No pleural effusion. Cephalization of the pulmonary vasculature without overt pulmonary edema. No acute consolidative airspace disease. Acute nondisplaced lateral right third and fourth rib fractures. No suspicious focal osseous  lesions. IMPRESSION: Acute nondisplaced lateral right third and fourth rib fractures. No pneumothorax. No hemothorax. Stable mild cardiomegaly without overt pulmonary edema. Electronically Signed   By: Ilona Sorrel M.D.   On: 07/19/2018 18:15    Review of Systems  All other systems reviewed and are negative.   Blood pressure (!) 171/83, pulse (!) 101, temperature 97.7 F (36.5 C), temperature source Oral, resp. rate (!) 27, SpO2 94 %. Physical Exam  Constitutional: She is oriented to person, place, and time. She appears well-developed.  HENT:  Head: Normocephalic and atraumatic.  Eyes: Pupils are equal, round, and reactive to light. EOM are normal.  Neck: Normal range of motion. Neck supple.  Cardiovascular: Normal rate and regular rhythm.  Respiratory: Effort normal and breath sounds normal. She exhibits tenderness.  GI: Soft. Bowel sounds are normal. There is tenderness.  Musculoskeletal: Normal range of motion.  Neurological: She is alert and oriented to person, place, and time. She has normal strength. No sensory deficit. GCS eye subscore is 4. GCS verbal subscore is 5. GCS motor subscore is 6.  Skin: Skin is warm and dry.  Psychiatric: She has a normal mood and affect. Her behavior is normal. Judgment and thought content normal.     Assessment/Plan Fall  Right third and fourth rib fracture without accompanying feature-admit for pulmonary toilet and pain management.  She does have COPD and a significant smoking history.  Recommend admission for pain management.  Right upper quadrant abdominal pain-check CT scan abdomen pelvis to exclude liver laceration or other cocking features that she has right sided flank and back pain.  Turner Daniels, MD 07/19/2018, 7:48 PM

## 2018-07-20 ENCOUNTER — Inpatient Hospital Stay (HOSPITAL_COMMUNITY): Payer: Medicare Other

## 2018-07-20 LAB — BASIC METABOLIC PANEL
ANION GAP: 7 (ref 5–15)
BUN: 22 mg/dL (ref 8–23)
CHLORIDE: 99 mmol/L (ref 98–111)
CO2: 24 mmol/L (ref 22–32)
CREATININE: 1.09 mg/dL — AB (ref 0.44–1.00)
Calcium: 9.3 mg/dL (ref 8.9–10.3)
GFR calc non Af Amer: 46 mL/min — ABNORMAL LOW (ref >60)
GFR, EST AFRICAN AMERICAN: 53 mL/min — AB (ref >60)
Glucose, Bld: 132 mg/dL — ABNORMAL HIGH (ref 70–99)
Potassium: 4.4 mmol/L (ref 3.5–5.1)
Sodium: 130 mmol/L — ABNORMAL LOW (ref 135–145)

## 2018-07-20 LAB — CBC
HEMATOCRIT: 33.2 % — AB (ref 36.0–46.0)
Hemoglobin: 11.1 g/dL — ABNORMAL LOW (ref 12.0–15.0)
MCH: 31.7 pg (ref 26.0–34.0)
MCHC: 33.4 g/dL (ref 30.0–36.0)
MCV: 94.9 fL (ref 78.0–100.0)
Platelets: 208 10*3/uL (ref 150–400)
RBC: 3.5 MIL/uL — ABNORMAL LOW (ref 3.87–5.11)
RDW: 13 % (ref 11.5–15.5)
WBC: 10.6 10*3/uL — ABNORMAL HIGH (ref 4.0–10.5)

## 2018-07-20 LAB — MRSA PCR SCREENING: MRSA by PCR: NEGATIVE

## 2018-07-20 MED ORDER — ENOXAPARIN SODIUM 30 MG/0.3ML ~~LOC~~ SOLN
30.0000 mg | SUBCUTANEOUS | Status: DC
  Administered 2018-07-20 – 2018-07-22 (×3): 30 mg via SUBCUTANEOUS
  Filled 2018-07-20 (×3): qty 0.3

## 2018-07-20 MED ORDER — CALCIUM CARBONATE ANTACID 500 MG PO CHEW
1.0000 | CHEWABLE_TABLET | Freq: Three times a day (TID) | ORAL | Status: DC
  Administered 2018-07-20 – 2018-07-23 (×9): 200 mg via ORAL
  Filled 2018-07-20 (×9): qty 1

## 2018-07-20 NOTE — Clinical Social Work Note (Signed)
Clinical Social Work Assessment  Patient Details  Name: Barbara Dennis MRN: [id number] Date of Birth: [birth year]  Date of referral:  07/20/18               Reason for consult:  Substance Use/ETOH Abuse                Permission sought to share information with:    Permission granted to share information::     Name::        Agency::     Relationship::     Contact Information:     Housing/Transportation Living arrangements for the past 2 months:  Single Family Home Source of Information:  Patient Patient Interpreter Needed:  None Criminal Activity/Legal Involvement Pertinent to Current Situation/Hospitalization:  No - Comment as needed Significant Relationships:  Other Family Members, Friend Lives with:  Self Do you feel safe going back to the place where you live?  Yes Need for family participation in patient care:  No (Coment)  Care giving concerns:  Pt is alert and oriented.    Social Worker assessment / plan:  CSW spoke with pt at bedside to complete SBIRT. Pt can recall what brought her to the hospital. Pt declines any substance abuse. Pt states she has never drank or use substance. Pt states she lives alone and will probably need rehab. Pt has been to CIR before.   Employment status:  Retired Nurse, adult PT Recommendations:  Not assessed at this time Information / Referral to community resources:  SBIRT  Patient/Family's Response to care:  Pt verbalized understanding of CSW role and expressed appreciation for support. Pt denies any concern regarding pt care at this time.   Patient/Family's Understanding of and Emotional Response to Diagnosis, Current Treatment, and Prognosis:  Pt verbalized understanding of screening. Pt denies any substance use. Clinical Social Worker will sign off for now as social work intervention is no longer needed. Please consult [place] again if new need arises.    Emotional Assessment Appearance:  Appears stated  age Attitude/Demeanor/Rapport:  (patient was appropriate) Affect (typically observed):  Accepting, Appropriate, Calm Orientation:  Oriented to Self, Oriented to Place, Oriented to  Time, Oriented to Situation Alcohol / Substance use:  Never Used Psych involvement (Current and /or in the community):  No (Comment)  Discharge Needs  Concerns to be addressed:  Care Coordination Readmission within the last 30 days:  No Current discharge risk:  Dependent with Mobility, Lives alone Barriers to Discharge:  Continued Medical Work up   [employer], LCSW 07/20/2018, 12:10 PM

## 2018-07-20 NOTE — Progress Notes (Signed)
[facility] Progress Note     Subjective: CC: pain in right ribs  Patient reports some pain in right ribs but pain control is overall good. Denies increased SOB. Patient reports she does not wear oxygen at home, currently on 4L via [place]. Had not used IS. Denies abdominal pain, nausea. Denies back pain for me, denies any new numbness or tingling. Lives alone but does have some friends that occasionally check on her.   Objective: Vital signs in last 24 hours: Temp:  [97.5 F (36.4 C)-98.6 F (37 C)] 98.6 F (37 C) (09/30 0414) Pulse Rate:  [96-106] 104 (09/30 0414) Resp:  [16-28] 17 (09/30 0207) BP: (103-190)/(66-94) 106/72 (09/30 0414) SpO2:  [88 %-98 %] 95 % (09/30 0414) Weight:  [42.6 kg] 42.6 kg (09/29 2053) Last BM Date: 07/19/18  Intake/Output from previous day: 09/29 0701 - 09/30 0700 In: 4270 [P.O.:600; I.V.:3; IV Piggyback:1000] Out: 100 [Urine:100] Intake/Output this shift: No intake/output data recorded.  PE: Gen:  Alert, NAD, pleasant Card:  Sinus , pedal pulses 2+ BL Pulm:  Normal effort, diminished bilaterally, pulled 250 on IS, O2 sats in low 90s on 4L Abd: Soft, non-tender, non-distended, bowel sounds present, no HSM Skin: warm and dry, no rashes  Ext: sensation/motor intact in bilateral upper and lower extremities  Lab Results:  Recent Labs    07/19/18 1629 07/20/18 0734  WBC 14.1* 10.6*  HGB 12.0 11.1*  HCT 35.6* 33.2*  PLT 217 208   BMET Recent Labs    07/19/18 1629 07/20/18 0734  NA 127* 130*  K 4.4 4.4  CL 94* 99  CO2 21* 24  GLUCOSE 127* 132*  BUN 18 22  CREATININE 0.95 1.09*  CALCIUM 9.3 9.3   PT/INR No results for input(s): LABPROT, INR in the last 72 hours. CMP     Component Value Date/Time   NA 130 (L) 07/20/2018 0734   NA 128 (L) 03/31/2018 1643   K 4.4 07/20/2018 0734   CL 99 07/20/2018 0734   CO2 24 07/20/2018 0734   GLUCOSE 132 (H) 07/20/2018 0734   BUN 22 07/20/2018 0734   BUN 14 03/31/2018 1643   CREATININE 1.09 (H) 07/20/2018 0734   CREATININE 0.98 (H) 04/02/2016 0903   CALCIUM 9.3 07/20/2018 0734   PROT 7.7 07/19/2018 1629   PROT 7.9 03/31/2018 1643   ALBUMIN 3.6 07/19/2018 1629   ALBUMIN 4.4 03/31/2018 1643   AST 21 07/19/2018 1629   ALT 17 07/19/2018 1629   ALKPHOS 110 07/19/2018 1629   BILITOT 1.1 07/19/2018 1629   BILITOT 0.4 03/31/2018 1643   GFRNONAA 46 (L) 07/20/2018 0734   GFRAA 53 (L) 07/20/2018 0734   Lipase     Component Value Date/Time   LIPASE 64 03/31/2018 1643       Studies/Results: Dg Ribs Unilateral W/chest Right  Result Date: 07/19/2018 CLINICAL DATA:  Right-sided pain after fall EXAM: RIGHT RIBS AND CHEST - 3+ VIEW COMPARISON:  03/31/2018 chest radiograph. FINDINGS: Stable cardiomediastinal silhouette with mild cardiomegaly. No pneumothorax. No pleural effusion. Cephalization of the pulmonary vasculature without overt pulmonary edema. No acute consolidative airspace disease. Acute nondisplaced lateral right third and fourth rib fractures. No suspicious focal osseous lesions. IMPRESSION: Acute nondisplaced lateral right third and fourth rib fractures. No pneumothorax. No hemothorax. Stable mild cardiomegaly without overt pulmonary edema. Electronically Signed   By: Ilona Sorrel M.D.   On: 07/19/2018 18:15   Ct Abdomen Pelvis W Contrast  Result Date: 07/19/2018 CLINICAL DATA:  Abdominal pain,  fall EXAM: CT ABDOMEN AND PELVIS WITH CONTRAST TECHNIQUE: Multidetector CT imaging of the abdomen and pelvis was performed using the standard protocol following bolus administration of intravenous contrast. CONTRAST:  18mL OMNIPAQUE IOHEXOL 300 MG/ML  SOLN COMPARISON:  07/14/2018 FINDINGS: Motion degraded images. Lower chest: Mild patchy bilateral lower lobe opacities, right greater than left, likely atelectasis. Hepatobiliary: Scattered subcentimeter hepatic cysts. Gallbladder is unremarkable. No intrahepatic or extrahepatic ductal dilatation. Pancreas: Within normal  limits. Spleen: Within normal limits. Adrenals/Urinary Tract: Adrenal glands are within normal limits. 7 mm cyst in the medial right upper kidney (series 8/image 10). Left kidney is within normal limits. No hydronephrosis. Bladder is within normal limits. Stomach/Bowel: Stomach is within normal limits. No evidence of bowel obstruction. Appendix is not discretely visualized, reportedly surgically absent. Left colon is decompressed. Vascular/Lymphatic: No evidence of abdominal aortic aneurysm. Atherosclerotic calcifications of the abdominal aorta and branch vessels. No suspicious abdominopelvic lymphadenopathy. Reproductive: Uterus and bilateral ovaries are grossly unremarkable. Other: No abdominopelvic ascites. Musculoskeletal: Mild to moderate compression fracture deformity at T12, with approximately 30% loss of height (sagittal image 87). Minimal retropulsion. This is new from recent CT. Mild superior endplate changes at L3, unchanged. Mild degenerative changes of the visualized thoracolumbar spine. IMPRESSION: Mild to moderate compression fracture deformity at T12, new from recent CT. Minimal retropulsion. Additional stable ancillary findings as above. Electronically Signed   By: Julian Hy M.D.   On: 07/19/2018 23:13   Dg Chest Port 1 View  Result Date: 07/20/2018 CLINICAL DATA:  Follow-up rib fractures EXAM: PORTABLE CHEST 1 VIEW COMPARISON:  07/19/2018 FINDINGS: Cardiac shadow is at the upper limits of normal in size. Aortic calcifications are again seen. Calcified lymph nodes are noted within the hila and mediastinum consistent with prior granulomatous disease. Diffuse increased interstitial changes are again noted and stable. Mild scarring is seen. Right-sided rib fractures are again noted without pneumothorax. IMPRESSION: Right rib fractures without new pneumothorax. Mild interstitial changes stable from the previous exam. Changes of prior granulomatous disease. Electronically Signed   By: Inez Catalina M.D.   On: 07/20/2018 08:24    Anti-infectives: Anti-infectives (From admission, onward)   None       Assessment/Plan Fall R 3-4th rib fractures - repeat CXR without pneumothorax - IS, pulm toiletry, pain control COPD - wean O2 as able T12 compression fracture - NS consulted, no intervention needed. PT/OT  FEN: reg diet, KVO  VTE: SCDs, lovenox ID: no current abx indicated  Dispo: PT/OT  LOS: 1 day     Brigid Re , [facility] Surgery 07/20/2018, 8:30 AM Pager: [phone] Mon-Fri 7:00 am-4:30 pm Sat-Sun 7:00 am-11:30 am

## 2018-07-20 NOTE — NC FL2 (Signed)
[place] LEVEL OF CARE SCREENING TOOL     IDENTIFICATION  Patient Name: Barbara Dennis Birthdate: 01-18-1934 Sex: female Admission Date (Current Location): 07/19/2018  [facility] and [place] Number:  Herbalist and Address:  [facility], [place] [address] [place], [place] [address]      Provider Number: [id number]  Attending Physician Name and Address:  Md, Trauma, MD  Relative Name and Phone Number:       Current Level of Care: Hospital Recommended Level of Care: [place] Prior Approval Number:    Date Approved/Denied:   PASRR Number: [id number]  Discharge Plan: SNF    Current Diagnoses: Patient Active Problem List   Diagnosis Date Noted  . Multiple rib fractures 07/19/2018  . Nausea 07/16/2018  . Gastroesophageal reflux disease 07/16/2018  . Generalized abdominal pain 05/01/2018  . Chronic pain syndrome 01/07/2018  . Chronic obstructive pulmonary disease ([place]) 08/20/2017  . Multinodular goiter 08/15/2017  . Musculoskeletal pain 07/15/2017  . Bilateral leg pain 07/15/2017  . Chronic bilateral low back pain without sciatica 07/15/2017  . Osteopenia of multiple sites 07/15/2017  . Essential hypertension, benign 07/15/2017  . Hypercalcemia 07/15/2017  . Depression with anxiety 01/06/2015  . 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 HTN, goal below 150/90 06/17/2013  . DJD (degenerative joint disease) 02/16/2013  . Tobacco user 09/23/2012  . Menopause 08/21/2012  . Postmenopausal atrophic vaginitis 08/21/2012    Orientation RESPIRATION BLADDER Height & Weight     Self, Time, Situation, Place  O2(Nasal Cannula 4L) External catheter, Incontinent(placed 07/19/18) Weight: 94 lb (42.6 kg) Height:  5' (152.4 cm)  BEHAVIORAL SYMPTOMS/MOOD NEUROLOGICAL BOWEL NUTRITION STATUS      Continent Diet(regular diet thin liquids\)  AMBULATORY STATUS COMMUNICATION OF NEEDS Skin   Extensive Assist Verbally(speaks  mostly korean) Normal                       Personal Care Assistance Level of Assistance  Dressing, Feeding, Bathing Bathing Assistance: Maximum assistance Feeding assistance: Independent Dressing Assistance: Maximum assistance     Functional Limitations Info  Sight, Hearing, Speech Sight Info: Adequate Hearing Info: Adequate Speech Info: Adequate(Speaks mostly [place])    SPECIAL CARE FACTORS FREQUENCY  PT (By licensed PT), OT (By licensed OT)     PT Frequency: 2x OT Frequency: 2x            Contractures Contractures Info: Not present    Additional Factors Info  Code Status Code Status Info: Full Code Allergies Info: No known allergies           Current Medications (07/20/2018):  This is the current hospital active medication list Current Facility-Administered Medications  Medication Dose Route Frequency Provider Last Rate Last Dose  . 0.9 %  sodium chloride infusion  250 mL Intravenous PRN Cornett, Thomas, MD      . acetaminophen (TYLENOL) tablet 325 mg  325 mg Oral Q6H PRN Cornett, Thomas, MD      . bismuth subsalicylate (PEPTO BISMOL) chewable tablet 262 mg  262 mg Oral PRN Cornett, Thomas, MD      . enoxaparin (LOVENOX) injection 30 mg  30 mg Subcutaneous Q24H Judeth Horn, MD      . hydrALAZINE (APRESOLINE) injection 10 mg  10 mg Intravenous Q2H PRN Cornett, Thomas, MD      . lisinopril (PRINIVIL,ZESTRIL) tablet 10 mg  10 mg Oral QHS Cornett, Marcello Moores, MD   10 mg at 07/19/18  2154  . morphine 2 MG/ML injection 1 mg  1 mg Intravenous Q1H PRN Erroll Luna, MD   1 mg at 07/20/18 0736  . ondansetron (ZOFRAN-ODT) disintegrating tablet 4 mg  4 mg Oral Q6H PRN Cornett, Thomas, MD       Or  . ondansetron (ZOFRAN) injection 4 mg  4 mg Intravenous Q6H PRN Cornett, Thomas, MD      . pantoprazole (PROTONIX) EC tablet 40 mg  40 mg Oral Daily Cornett, Thomas, MD   40 mg at 07/20/18 1004  . sodium chloride flush (NS) 0.9 % injection 3 mL  3 mL Intravenous Q12H Cornett,  Thomas, MD   3 mL at 07/20/18 1005  . sodium chloride flush (NS) 0.9 % injection 3 mL  3 mL Intravenous PRN Cornett, Thomas, MD      . traMADol Veatrice Bourbon) tablet 50 mg  50 mg Oral Q8H PRN Erroll Luna, MD   50 mg at 07/20/18 0108  . traZODone (DESYREL) tablet 25-50 mg  25-50 mg Oral QHS PRN Erroll Luna, MD   50 mg at 07/20/18 0108     Discharge Medications: Please see discharge summary for a list of discharge medications.  Relevant Imaging Results:  Relevant Lab Results:   Additional Information SSN: [id number]  Eileen Stanford, LCSW

## 2018-07-20 NOTE — Consult Note (Signed)
Neurosurgery Consultation  Reason for Consult: Thoracic compression fracture Referring Physician: Cornett  CC: Rib pain  HPI: This is a 84 y.o. woman that presents with rib pain after a fall. No back pain, no new weakness, numbness, or parasthesias, no recent change in bowel or bladder function. No recent use of anti-platelet or anti-coagulant medications. Currently her only complaint is some inspiratory pain around the area of her fractures, which is sharp in quality, non-radiating, moderate in intensity, and worse with inspiration.    ROS: A 14 point ROS was performed and is negative except as noted in the HPI.   PMHx:  Past Medical History:  Diagnosis Date  . Hypertension   . Mass of right side of neck    FamHx:  Family History  Problem Relation Age of Onset  . Hypercalcemia Neg Hx    SocHx:  reports that she has been smoking cigarettes. She has a 42.00 pack-year smoking history. She has never used smokeless tobacco. She reports that she does not drink alcohol or use drugs.  Exam: Vital signs in last 24 hours: Temp:  [97.5 F (36.4 C)-98.6 F (37 C)] 97.7 F (36.5 C) (09/30 1014) Pulse Rate:  [96-115] 115 (09/30 1014) Resp:  [16-28] 16 (09/30 1014) BP: (103-190)/(66-94) 123/69 (09/30 1014) SpO2:  [88 %-98 %] 92 % (09/30 1014) Weight:  [42.6 kg] 42.6 kg (09/29 2053) General: Awake, alert, cooperative, lying in bed in NAD Head: normocephalic and atruamatic HEENT: neck supple Pulmonary: on supplemental O2, breathing with no obvious signs of discomfort Cardiac: RRR Abdomen: S NT ND Extremities: warm and well perfused x4 Neuro: Not primarily English speaking, also appears to have mild dysarthria, Awake/alert, PERRL, gaze conjugate, face symmetric FCx4, Strength grossly 5/5 x4, SILTx4   Assessment and Plan: 84 y.o. woman s/p fall with rib fractures, NSGY consulted for CT finding of T12 compression fracture. Spine views of CT CAP personally reviewed, which show a T12  compression fracture without clinically significant loss of height or retropulsion.  -stable fracture pattern, no neurosurgical intervention indicated at this time -activity as tolerated, no need for a brace, no need for scheduled neurosurgery follow up. Pt can follow up prn if she starts having new back pain or other concerning symptoms. -please call with any concerns or questions  Judith Part, MD 07/20/18 10:17 AM [facility]

## 2018-07-20 NOTE — Evaluation (Addendum)
Physical Therapy Evaluation Patient Details Name: Barbara Dennis MRN: [id number] DOB: Mar 13, 1934 Today's Date: 07/20/2018   History of Present Illness  Pt is an 84 y/o female admitted after falling. Found to have T12 compression fx and 3-4 R rib fxs. PMH includes HTN, COPD, and tobacco use.   Clinical Impression  Pt admitted secondary to problem above with deficits below. Mobility limited to standing X2 this session secondary to pain. Required mod A to perform standing tasks. Pt currently lives alone and has had multiple falls at home. Feel she will need increased assist at d/c. Will continue to follow acutely to maximize functional mobility independence and safety.   [place] interpreter service used: [phone]    Follow Up Recommendations SNF;Supervision/Assistance - 24 hour    Equipment Recommendations  None recommended by PT    Recommendations for Other Services       Precautions / Restrictions Precautions Precautions: Fall;Back Precaution Booklet Issued: No Precaution Comments: Reports multiple falls at home. Verbally reviewed back precautions with pt.   Restrictions Weight Bearing Restrictions: No      Mobility  Bed Mobility               General bed mobility comments: In chair upon entry.   Transfers Overall transfer level: Needs assistance Equipment used: 1 person hand held assist Transfers: Sit to/from Stand Sit to Stand: Mod assist         General transfer comment: Required mod A for lift assist and steadying. PT standing in front of pt to perform transfer. Performed sit<>Stand X2, however, further mobility deferred secondary to increased pain.   Ambulation/Gait             General Gait Details: Unable   Financial trader Rankin (Stroke Patients Only)       Balance Overall balance assessment: Needs assistance Sitting-balance support: Bilateral upper extremity supported;Feet supported Sitting  balance-Leahy Scale: Poor Sitting balance - Comments: Reliant on bUE support for sitting balance secondary to pain   Standing balance support: Bilateral upper extremity supported Standing balance-Leahy Scale: Poor Standing balance comment: Reliant on BUE support                              Pertinent Vitals/Pain Pain Assessment: 0-10 Pain Score: 7  Pain Location: back  Pain Descriptors / Indicators: Grimacing;Guarding;Sharp Pain Intervention(s): Monitored during session;Limited activity within patient's tolerance;Repositioned    Home Living Family/patient expects to be discharged to:: Skilled nursing facility                      Prior Function Level of Independence: Independent with assistive device(s)         Comments: Reports use of cane at home, however, had multiple falls. Reports she lives alone.       Hand Dominance        Extremity/Trunk Assessment   Upper Extremity Assessment Upper Extremity Assessment: Defer to OT evaluation    Lower Extremity Assessment Lower Extremity Assessment: Generalized weakness    Cervical / Trunk Assessment Cervical / Trunk Assessment: Other exceptions Cervical / Trunk Exceptions: T12 compression fx  Communication   Communication: Interpreter utilized;Prefers language other than English(Korean; [employer] interpreters [phone])  Cognition Arousal/Alertness: Awake/alert Behavior During Therapy: WFL for tasks assessed/performed Overall Cognitive Status: Within Functional Limits for tasks assessed  General Comments General comments (skin integrity, edema, etc.): [name] daughter present during session.     Exercises     Assessment/Plan    PT Assessment Patient needs continued PT services  PT Problem List Decreased strength;Decreased activity tolerance;Decreased balance;Decreased mobility;Decreased knowledge of use of DME;Decreased knowledge of  precautions;Pain       PT Treatment Interventions DME instruction;Gait training;Therapeutic exercise;Therapeutic activities;Functional mobility training;Balance training;Patient/family education    PT Goals (Current goals can be found in the Care Plan section)  Acute Rehab PT Goals Patient Stated Goal: to decrease pain  PT Goal Formulation: With patient Time For Goal Achievement: 08/03/18 Potential to Achieve Goals: Fair    Frequency Min 2X/week   Barriers to discharge Decreased caregiver support      Co-evaluation               AM-PAC PT "6 Clicks" Daily Activity  Outcome Measure Difficulty turning over in bed (including adjusting bedclothes, sheets and blankets)?: Unable Difficulty moving from lying on back to sitting on the side of the bed? : Unable Difficulty sitting down on and standing up from a chair with arms (e.g., wheelchair, bedside commode, etc,.)?: Unable Help needed moving to and from a bed to chair (including a wheelchair)?: A Lot Help needed walking in hospital room?: Total Help needed climbing 3-5 steps with a railing? : Total 6 Click Score: 7    End of Session   Activity Tolerance: Patient limited by pain Patient left: in chair;with call bell/phone within reach;with chair alarm set;with family/visitor present Nurse Communication: Mobility status PT Visit Diagnosis: Unsteadiness on feet (R26.81);Repeated falls (R29.6);Muscle weakness (generalized) (M62.81);History of falling (Z91.81);Pain Pain - part of body: (back)    Time: 3295-1884 PT Time Calculation (min) (ACUTE ONLY): 18 min   Charges:   PT Evaluation $PT Eval Moderate Complexity: [place], PT, DPT  Acute Rehabilitation Services  Pager: [phone] Office: [phone]   Rudean Hitt 07/20/2018, 1:52 PM

## 2018-07-21 LAB — GLUCOSE, CAPILLARY: Glucose-Capillary: 144 mg/dL — ABNORMAL HIGH (ref 70–99)

## 2018-07-21 LAB — BASIC METABOLIC PANEL
Anion gap: 8 (ref 5–15)
BUN: 27 mg/dL — AB (ref 8–23)
CALCIUM: 8.8 mg/dL — AB (ref 8.9–10.3)
CO2: 23 mmol/L (ref 22–32)
CREATININE: 1.27 mg/dL — AB (ref 0.44–1.00)
Chloride: 97 mmol/L — ABNORMAL LOW (ref 98–111)
GFR calc non Af Amer: 38 mL/min — ABNORMAL LOW (ref >60)
GFR, EST AFRICAN AMERICAN: 44 mL/min — AB (ref >60)
Glucose, Bld: 127 mg/dL — ABNORMAL HIGH (ref 70–99)
Potassium: 4.4 mmol/L (ref 3.5–5.1)
Sodium: 128 mmol/L — ABNORMAL LOW (ref 135–145)

## 2018-07-21 MED ORDER — METHOCARBAMOL 500 MG PO TABS
1000.0000 mg | ORAL_TABLET | Freq: Three times a day (TID) | ORAL | Status: DC
  Administered 2018-07-21 – 2018-07-23 (×8): 1000 mg via ORAL
  Filled 2018-07-21 (×8): qty 2

## 2018-07-21 MED ORDER — SODIUM CHLORIDE 0.9 % IV SOLN
INTRAVENOUS | Status: DC
  Administered 2018-07-21 (×2): via INTRAVENOUS

## 2018-07-21 MED ORDER — TRAMADOL HCL 50 MG PO TABS: 50.0000 mg | ORAL_TABLET | Freq: Four times a day (QID) | ORAL | Status: DC | PRN

## 2018-07-21 MED ORDER — ACETAMINOPHEN 500 MG PO TABS
1000.0000 mg | ORAL_TABLET | Freq: Three times a day (TID) | ORAL | Status: DC
  Administered 2018-07-21 – 2018-07-23 (×8): 1000 mg via ORAL
  Filled 2018-07-21 (×8): qty 2

## 2018-07-21 NOTE — Evaluation (Signed)
Occupational Therapy Evaluation Patient Details Name: Barbara Dennis MRN: [id number] DOB: [birth year] Today's Date: 07/21/2018    History of Present Illness Pt is an 84 y/o female admitted after falling. Found to have T12 compression fx and 3-4 R rib fxs. PMH includes HTN, COPD, and tobacco use.    Clinical Impression   PT admitted with T12 compression fx and R rib fxs. Pt currently with functional limitiations due to the deficits listed below (see OT problem list). Pt without recall of back injury and no recall of precautions this session. Pt and caregivers agreeable to SNF placement at this time. Pt limited by pain. Pt also expressed nausea with any attempts at eating. Pt only able to take two pieces per caregiver.  Pt will benefit from skilled OT to increase their independence and safety with adls and balance to allow discharge SNF.     Follow Up Recommendations  SNF    Equipment Recommendations  Hospital bed;3 in 1 bedside commode    Recommendations for Other Services PT consult     Precautions / Restrictions Precautions Precautions: Fall;Back Precaution Booklet Issued: No Precaution Comments: Reports multiple falls at home. educated on back precautions  Restrictions Weight Bearing Restrictions: No      Mobility Bed Mobility Overal bed mobility: Needs Assistance Bed Mobility: Rolling;Supine to Sit Rolling: Mod assist   Supine to sit: Mod assist     General bed mobility comments: pt educated on back precautions and used teach back making the patient say the information back to therapist. pt and caregiver repeating the information. pt unable to scoot to eob and required the pad used to progress  Transfers Overall transfer level: Needs assistance Equipment used: Rolling walker (2 wheeled) Transfers: Sit to/from Stand Sit to Stand: Mod assist         General transfer comment: requires (A) to elevate from bed surface and elevated bed surface    Balance Overall  balance assessment: Needs assistance Sitting-balance support: Bilateral upper extremity supported;Feet supported Sitting balance-Leahy Scale: Poor Sitting balance - Comments: Reliant on bUE support for sitting balance secondary to pain   Standing balance support: Bilateral upper extremity supported Standing balance-Leahy Scale: Poor Standing balance comment: Reliant on BUE support                            ADL either performed or assessed with clinical judgement   ADL Overall ADL's : Needs assistance/impaired Eating/Feeding: Moderate assistance Eating/Feeding Details (indicate cue type and reason): caregiver reports patient is nauseated after 2 bites of food each time Grooming: Moderate assistance   Upper Body Bathing: Moderate assistance   Lower Body Bathing: Maximal assistance   Upper Body Dressing : Moderate assistance   Lower Body Dressing: Maximal assistance   Toilet Transfer: Moderate assistance   Toileting- Clothing Manipulation and Hygiene: Maximal assistance         General ADL Comments: pt able to progress to chair level this session with max cues to not sit prematurely. pt able to stand pivot only.      Vision         Perception     Praxis      Pertinent Vitals/Pain Pain Assessment: Faces Faces Pain Scale: Hurts even more Pain Location: back  Pain Descriptors / Indicators: Grimacing;Guarding;Sharp Pain Intervention(s): Monitored during session;Premedicated before session;Repositioned     Hand Dominance Right   Extremity/Trunk Assessment Upper Extremity Assessment Upper Extremity Assessment: RUE deficits/detail RUE Deficits /  Details: limited ROM due to rib discomfort noted   Lower Extremity Assessment Lower Extremity Assessment: Defer to PT evaluation   Cervical / Trunk Assessment Cervical / Trunk Assessment: Other exceptions Cervical / Trunk Exceptions: T12 compression fx R rib fx   Communication Communication Communication:  Interpreter utilized;Prefers language other than English(Korean; [place] interpreters #[id number] [employer])   Cognition Arousal/Alertness: Awake/alert Behavior During Therapy: WFL for tasks assessed/performed Overall Cognitive Status: Difficult to assess                                 General Comments: pt with no awareness to T12 fx and seemed shocked to learn information during OT session. Caregiver unaware as well of injury   General Comments  caregivers in room are x2 church members. pt has no immediately family to (A). caregivers at time during session disagreeing with each other . the patient has given permission for these two women to have informatoin and be involved in her POC. Pt verbalized this session with RN and CSW present    Exercises     Shoulder Instructions      Home Living Family/patient expects to be discharged to:: Skilled nursing facility                                        Prior Functioning/Environment Level of Independence: Independent with assistive device(s)        Comments: Reports use of cane at home, however, had multiple falls. Reports she lives alone.          OT Problem List: Decreased strength;Decreased range of motion;Decreased activity tolerance;Impaired balance (sitting and/or standing);Decreased safety awareness;Decreased knowledge of use of DME or AE;Decreased cognition;Decreased knowledge of precautions;Cardiopulmonary status limiting activity;Impaired UE functional use;Pain      OT Treatment/Interventions: Self-care/ADL training;Therapeutic exercise;Neuromuscular education;Energy conservation;DME and/or AE instruction;Manual therapy;Modalities;Therapeutic activities;Cognitive remediation/compensation;Visual/perceptual remediation/compensation;Patient/family education;Balance training    OT Goals(Current goals can be found in the care plan section) Acute Rehab OT Goals Patient Stated Goal: to decrease pain  OT Goal  Formulation: With patient/family Time For Goal Achievement: 08/04/18 Potential to Achieve Goals: Good  OT Frequency: Min 2X/week   Barriers to D/C: Decreased caregiver support  no one at night time to help / no 24/7 (A)       Co-evaluation              AM-PAC PT "6 Clicks" Daily Activity     Outcome Measure Help from another person eating meals?: A Little Help from another person taking care of personal grooming?: A Lot Help from another person toileting, which includes using toliet, bedpan, or urinal?: A Lot Help from another person bathing (including washing, rinsing, drying)?: A Lot Help from another person to put on and taking off regular upper body clothing?: A Lot Help from another person to put on and taking off regular lower body clothing?: Total 6 Click Score: 12   End of Session Equipment Utilized During Treatment: Rolling walker;Oxygen Nurse Communication: Mobility status;Precautions  Activity Tolerance: Patient limited by pain Patient left: in chair;with call bell/phone within reach;with chair alarm set;with family/visitor present;with nursing/sitter in room  OT Visit Diagnosis: Unsteadiness on feet (R26.81);Repeated falls (R29.6);Muscle weakness (generalized) (M62.81)                Time: 7939-0300 OT Time Calculation (min): 39 min Charges:  OT General Charges $OT Visit: 1 Visit OT Evaluation $OT Eval Moderate Complexity: 1 Mod OT Treatments $Self Care/Home Management : 8-22 mins   Jeri Modena, OTR/L  Acute Rehabilitation Services Pager: [phone] Office: [phone] .   Parke Poisson B 07/21/2018, 11:32 AM

## 2018-07-21 NOTE — Clinical Social Work Note (Signed)
Clinical Social Work Assessment  Patient Details  Name: Barbara Dennis MRN: [id number] Date of Birth: 1934-06-07  Date of referral:  07/21/18               Reason for consult:  Facility Placement                Permission sought to share information with:  Family Supports, Customer service manager Permission granted to share information::  Yes, Verbal Permission Granted  Name::     [place]::  SNfs  Relationship::  caregiver   Contact Information:  [phone]  Housing/Transportation Living arrangements for the past 2 months:  [place] of Information:  Patient, Friend/Neighbor Patient Interpreter Needed:  [place] Criminal Activity/Legal Involvement Pertinent to Current Situation/Hospitalization:  No - Comment as needed Significant Relationships:  Friend Lives with:  Self Do you feel safe going back to the place where you live?  No Need for family participation in patient care:  Yes (Comment)  Care giving concerns:  **CSW used interrupter, patient speaks Jola Schmidt** CSW received consult for discharge needs. CSW spoke with patient regarding PT recommendation of SNF placement at time of discharge. Patient states she lives a lone. Patient and caregivers are concerns about recent multiple falls in the home. Patient states she does not have any family in this area and granted the CSW permission to talk with her caregivers. Patient caregivers states patient is a widow and she does not have any children.    Social Worker assessment / plan:  Patient was alert and oriented. CSW spoke with patient concerning possibility of rehab at [facility] before returning home.    Employment status:  Retired Nurse, adult PT Recommendations:  [place] / Referral to community resources:  [place]  Patient/Family's Response to care:  Patient recognizes need for rehab before returning home and is agreeable to a  SNF placement. Patient and caregivers reported preference for [facility].  Patient/Family's Understanding of and Emotional Response to Diagnosis, Current Treatment, and Prognosis:  Patient is realistic regarding therapy needs and expressed being hopeful for SNF placement. Patient and caregivers expressed understanding of CSW role and discharge process as well as medical condition. No questions/concerns about plan or treatment.   Emotional Assessment Appearance:  Appears stated age Attitude/Demeanor/Rapport:  (patient was appropriate) Affect (typically observed):  Accepting, Appropriate, Calm Orientation:  Oriented to Self, Oriented to Place, Oriented to  Time, Oriented to Situation Alcohol / Substance use:  Never Used Psych involvement (Current and /or in the community):  No (Comment)  Discharge Needs  Concerns to be addressed:  Care Coordination Readmission within the last 30 days:  No Current discharge risk:  Dependent with Mobility, Lives alone Barriers to Discharge:  Continued Medical Work up   [employer], [place] 07/21/2018, 1:42 PM

## 2018-07-21 NOTE — Discharge Instructions (Signed)

## 2018-07-21 NOTE — Progress Notes (Signed)
[facility] Progress Note     Subjective: CC: pain in right side Patient complaining of more pain in right side that is keeping her from getting up or walking. She understands that PT recommending she go to SNF and is agreeable to this. Reports coughing up some sputum but states it is similar to what she was doing at home prior to fall. Denies SOB. Reports that she does not have a large appetite.   Objective: Vital signs in last 24 hours: Temp:  [97.7 F (36.5 C)-98.5 F (36.9 C)] 98.5 F (36.9 C) (10/01 0430) Pulse Rate:  [101-115] 101 (10/01 0430) Resp:  [16-18] 16 (10/01 0430) BP: (123-146)/(69-88) 131/72 (10/01 0430) SpO2:  [90 %-94 %] 92 % (10/01 0430) Last BM Date: 07/18/18  Intake/Output from previous day: 09/30 0701 - 10/01 0700 In: 263 [P.O.:260; I.V.:3] Out: -  Intake/Output this shift: No intake/output data recorded.  PE: Gen:  Alert, NAD, pleasant Card:  Sinus , pedal pulses 2+ BL Pulm:  Normal effort, diminished bilaterally, pulled 500 on IS, O2 sats in low 90s on 1L Abd: Soft, non-tender, non-distended, bowel sounds present, no HSM Skin: warm and dry, no rashes  Ext: sensation/motor intact in bilateral upper and lower extremities  Lab Results:  Recent Labs    07/19/18 1629 07/20/18 0734  WBC 14.1* 10.6*  HGB 12.0 11.1*  HCT 35.6* 33.2*  PLT 217 208   BMET Recent Labs    07/19/18 1629 07/20/18 0734  NA 127* 130*  K 4.4 4.4  CL 94* 99  CO2 21* 24  GLUCOSE 127* 132*  BUN 18 22  CREATININE 0.95 1.09*  CALCIUM 9.3 9.3   PT/INR No results for input(s): LABPROT, INR in the last 72 hours. CMP     Component Value Date/Time   NA 130 (L) 07/20/2018 0734   NA 128 (L) 03/31/2018 1643   K 4.4 07/20/2018 0734   CL 99 07/20/2018 0734   CO2 24 07/20/2018 0734   GLUCOSE 132 (H) 07/20/2018 0734   BUN 22 07/20/2018 0734   BUN 14 03/31/2018 1643   CREATININE 1.09 (H) 07/20/2018 0734   CREATININE 0.98 (H) 04/02/2016 0903   CALCIUM 9.3  07/20/2018 0734   PROT 7.7 07/19/2018 1629   PROT 7.9 03/31/2018 1643   ALBUMIN 3.6 07/19/2018 1629   ALBUMIN 4.4 03/31/2018 1643   AST 21 07/19/2018 1629   ALT 17 07/19/2018 1629   ALKPHOS 110 07/19/2018 1629   BILITOT 1.1 07/19/2018 1629   BILITOT 0.4 03/31/2018 1643   GFRNONAA 46 (L) 07/20/2018 0734   GFRAA 53 (L) 07/20/2018 0734   Lipase     Component Value Date/Time   LIPASE 64 03/31/2018 1643       Studies/Results: Dg Ribs Unilateral W/chest Right  Result Date: 07/19/2018 CLINICAL DATA:  Right-sided pain after fall EXAM: RIGHT RIBS AND CHEST - 3+ VIEW COMPARISON:  03/31/2018 chest radiograph. FINDINGS: Stable cardiomediastinal silhouette with mild cardiomegaly. No pneumothorax. No pleural effusion. Cephalization of the pulmonary vasculature without overt pulmonary edema. No acute consolidative airspace disease. Acute nondisplaced lateral right third and fourth rib fractures. No suspicious focal osseous lesions. IMPRESSION: Acute nondisplaced lateral right third and fourth rib fractures. No pneumothorax. No hemothorax. Stable mild cardiomegaly without overt pulmonary edema. Electronically Signed   By: Ilona Sorrel M.D.   On: 07/19/2018 18:15   Ct Abdomen Pelvis W Contrast  Result Date: 07/19/2018 CLINICAL DATA:  Abdominal pain, fall EXAM: CT ABDOMEN AND PELVIS WITH CONTRAST TECHNIQUE:  Multidetector CT imaging of the abdomen and pelvis was performed using the standard protocol following bolus administration of intravenous contrast. CONTRAST:  135mL OMNIPAQUE IOHEXOL 300 MG/ML  SOLN COMPARISON:  07/14/2018 FINDINGS: Motion degraded images. Lower chest: Mild patchy bilateral lower lobe opacities, right greater than left, likely atelectasis. Hepatobiliary: Scattered subcentimeter hepatic cysts. Gallbladder is unremarkable. No intrahepatic or extrahepatic ductal dilatation. Pancreas: Within normal limits. Spleen: Within normal limits. Adrenals/Urinary Tract: Adrenal glands are within  normal limits. 7 mm cyst in the medial right upper kidney (series 8/image 10). Left kidney is within normal limits. No hydronephrosis. Bladder is within normal limits. Stomach/Bowel: Stomach is within normal limits. No evidence of bowel obstruction. Appendix is not discretely visualized, reportedly surgically absent. Left colon is decompressed. Vascular/Lymphatic: No evidence of abdominal aortic aneurysm. Atherosclerotic calcifications of the abdominal aorta and branch vessels. No suspicious abdominopelvic lymphadenopathy. Reproductive: Uterus and bilateral ovaries are grossly unremarkable. Other: No abdominopelvic ascites. Musculoskeletal: Mild to moderate compression fracture deformity at T12, with approximately 30% loss of height (sagittal image 87). Minimal retropulsion. This is new from recent CT. Mild superior endplate changes at L3, unchanged. Mild degenerative changes of the visualized thoracolumbar spine. IMPRESSION: Mild to moderate compression fracture deformity at T12, new from recent CT. Minimal retropulsion. Additional stable ancillary findings as above. Electronically Signed   By: Julian Hy M.D.   On: 07/19/2018 23:13   Dg Chest Port 1 View  Result Date: 07/20/2018 CLINICAL DATA:  Follow-up rib fractures EXAM: PORTABLE CHEST 1 VIEW COMPARISON:  07/19/2018 FINDINGS: Cardiac shadow is at the upper limits of normal in size. Aortic calcifications are again seen. Calcified lymph nodes are noted within the hila and mediastinum consistent with prior granulomatous disease. Diffuse increased interstitial changes are again noted and stable. Mild scarring is seen. Right-sided rib fractures are again noted without pneumothorax. IMPRESSION: Right rib fractures without new pneumothorax. Mild interstitial changes stable from the previous exam. Changes of prior granulomatous disease. Electronically Signed   By: Inez Catalina M.D.   On: 07/20/2018 08:24    Anti-infectives: Anti-infectives (From  admission, onward)   None       Assessment/Plan Fall R 3-4th rib fractures - repeat CXR without pneumothorax - IS, pulm toiletry, pain control COPD - wean O2, patient does not wear supplemental oxygen at home T12 compression fracture - NS consulted, no intervention needed. PT/OT AKI - Cr 1.27 from 1.09 yesterday, baseline appears to be around 0.9 - 1.0; IVF and monitor  FEN: reg diet, NS @ 75 cc/h VTE: SCDs, lovenox ID: no current abx indicated  Dispo: SNF placement pending.   LOS: 2 days    Brigid Re , [facility] 07/21/2018, 7:26 AM Pager: [phone] Mon-Fri 7:00 am-4:30 pm Sat-Sun 7:00 am-11:30 am

## 2018-07-21 NOTE — Care Management Note (Signed)
Case Management Note  Patient Details  Name: Barbara Dennis MRN: [id number] Date of Birth: [birth year]  Subjective/Objective:   Pt is an 84 y/o female admitted after falling. Found to have T12 compression fx and 3-4 R rib fxs.  PTA, pt independent with assistive devices; she is widowed, and lives alone.                 Action/Plan: PT/OT recommending SNF; CSW following to facilitate dc to SNF upon medical stability.    Expected Discharge Date:                  Expected Discharge Plan:  Skilled Nursing Facility  In-House Referral:  Clinical Social Work  Discharge planning Services  CM Consult  Post Acute Care Choice:    Choice offered to:     DME Arranged:    DME Agency:     HH Arranged:    [place] Agency:     Status of Service:  In process, will continue to follow  If discussed at Long Length of Stay Meetings, dates discussed:    Additional Comments:  Reinaldo Raddle, RN, BSN  Trauma/Neuro ICU Case Manager [phone]

## 2018-07-22 ENCOUNTER — Other Ambulatory Visit: Payer: Self-pay

## 2018-07-22 LAB — BASIC METABOLIC PANEL
Anion gap: 2 — ABNORMAL LOW (ref 5–15)
BUN: 20 mg/dL (ref 8–23)
CHLORIDE: 104 mmol/L (ref 98–111)
CO2: 23 mmol/L (ref 22–32)
Calcium: 8.1 mg/dL — ABNORMAL LOW (ref 8.9–10.3)
Creatinine, Ser: 0.97 mg/dL (ref 0.44–1.00)
GFR calc non Af Amer: 53 mL/min — ABNORMAL LOW (ref >60)
GLUCOSE: 114 mg/dL — AB (ref 70–99)
POTASSIUM: 4.2 mmol/L (ref 3.5–5.1)
SODIUM: 129 mmol/L — AB (ref 135–145)

## 2018-07-22 MED ORDER — SODIUM CHLORIDE 1 G PO TABS
1.0000 g | ORAL_TABLET | Freq: Three times a day (TID) | ORAL | Status: DC
  Administered 2018-07-22 – 2018-07-23 (×3): 1 g via ORAL
  Filled 2018-07-22 (×4): qty 1

## 2018-07-22 MED ORDER — ADULT MULTIVITAMIN W/MINERALS CH
1.0000 | ORAL_TABLET | Freq: Every day | ORAL | Status: DC
  Administered 2018-07-22 – 2018-07-23 (×2): 1 via ORAL
  Filled 2018-07-22 (×2): qty 1

## 2018-07-22 MED ORDER — OXYCODONE HCL 5 MG PO TABS
2.5000 mg | ORAL_TABLET | ORAL | Status: DC | PRN
  Administered 2018-07-22 – 2018-07-23 (×4): 2.5 mg via ORAL
  Filled 2018-07-22 (×4): qty 1

## 2018-07-22 MED ORDER — ENSURE ENLIVE PO LIQD
237.0000 mL | Freq: Two times a day (BID) | ORAL | Status: DC
  Administered 2018-07-22 – 2018-07-23 (×4): 237 mL via ORAL

## 2018-07-22 MED ORDER — DOCUSATE SODIUM 100 MG PO CAPS
100.0000 mg | ORAL_CAPSULE | Freq: Two times a day (BID) | ORAL | Status: DC
  Administered 2018-07-22 – 2018-07-23 (×3): 100 mg via ORAL
  Filled 2018-07-22 (×3): qty 1

## 2018-07-22 MED ORDER — ALBUTEROL SULFATE (2.5 MG/3ML) 0.083% IN NEBU: 3.0000 mL | INHALATION_SOLUTION | RESPIRATORY_TRACT | Status: DC | PRN

## 2018-07-22 MED ORDER — TRAMADOL HCL 50 MG PO TABS
50.0000 mg | ORAL_TABLET | Freq: Four times a day (QID) | ORAL | Status: DC
  Administered 2018-07-22 – 2018-07-23 (×5): 50 mg via ORAL
  Filled 2018-07-22 (×5): qty 1

## 2018-07-22 MED ORDER — DICLOFENAC SODIUM 1 % TD GEL
2.0000 g | Freq: Four times a day (QID) | TRANSDERMAL | Status: DC
  Administered 2018-07-22 – 2018-07-23 (×4): 2 g via TOPICAL
  Filled 2018-07-22: qty 100

## 2018-07-22 NOTE — Progress Notes (Signed)
[facility] Progress Note     Subjective: CC: pain Patient not taking any tramadol or morphine, but complaining of bad pain and stating she can not get out of bed. Also describing chronic arthritis pains. Patient states she occasionally feels lightheaded, explained trying to wean off supplemental oxygen. Patient very insistent that she can not get out of bed due to pain, I tried multiple times to explain that the longer she remains immobile she will become more stiff and lose reserve strength. Patient also concerned that she has had decreased appetite, but reports this has been going on for a long time prior to hospital admission.   Objective: Vital signs in last 24 hours: Temp:  [97.7 F (36.5 C)-98.6 F (37 C)] 97.8 F (36.6 C) (10/02 0507) Pulse Rate:  [83-99] 88 (10/02 0507) Resp:  [16-17] 16 (10/02 0507) BP: (113-179)/(70-77) 144/74 (10/02 0507) SpO2:  [93 %-97 %] 97 % (10/02 0507) Last BM Date: 07/18/18  Intake/Output from previous day: 10/01 0701 - 10/02 0700 In: 881.2 [P.O.:342; I.V.:539.2] Out: 650 [Urine:650] Intake/Output this shift: No intake/output data recorded.  PE: Gen: Alert, NAD, pleasant Card:RRR, pedal pulses 2+ BL Pulm: Normal effort, diminished bilaterally, pulled 500 on IS, O2 sats in low 90s on 0.5L Abd: Soft, non-tender, non-distended, bowel sounds present, no HSM Skin: warm and dry, no rashes Ext: sensation/motor intact in bilateral upper and lower extremities  Lab Results:  Recent Labs    07/19/18 1629 07/20/18 0734  WBC 14.1* 10.6*  HGB 12.0 11.1*  HCT 35.6* 33.2*  PLT 217 208   BMET Recent Labs    07/21/18 0622 07/22/18 0429  NA 128* 129*  K 4.4 4.2  CL 97* 104  CO2 23 23  GLUCOSE 127* 114*  BUN 27* 20  CREATININE 1.27* 0.97  CALCIUM 8.8* 8.1*   PT/INR No results for input(s): LABPROT, INR in the last 72 hours. CMP     Component Value Date/Time   NA 129 (L) 07/22/2018 0429   NA 128 (L) 03/31/2018 1643   K  4.2 07/22/2018 0429   CL 104 07/22/2018 0429   CO2 23 07/22/2018 0429   GLUCOSE 114 (H) 07/22/2018 0429   BUN 20 07/22/2018 0429   BUN 14 03/31/2018 1643   CREATININE 0.97 07/22/2018 0429   CREATININE 0.98 (H) 04/02/2016 0903   CALCIUM 8.1 (L) 07/22/2018 0429   PROT 7.7 07/19/2018 1629   PROT 7.9 03/31/2018 1643   ALBUMIN 3.6 07/19/2018 1629   ALBUMIN 4.4 03/31/2018 1643   AST 21 07/19/2018 1629   ALT 17 07/19/2018 1629   ALKPHOS 110 07/19/2018 1629   BILITOT 1.1 07/19/2018 1629   BILITOT 0.4 03/31/2018 1643   GFRNONAA 53 (L) 07/22/2018 0429   GFRAA >60 07/22/2018 0429   Lipase     Component Value Date/Time   LIPASE 64 03/31/2018 1643       Studies/Results: No results found.  Anti-infectives: Anti-infectives (From admission, onward)   None       Assessment/Plan Fall R 3-4th rib fractures- repeat CXR without pneumothorax - IS, pulm toiletry, pain control COPD- wean O2, patient does not wear supplemental oxygen at home T12 compression fracture- NS consulted, no intervention needed. PT/OT AKI - Cr 0.97, improved, d/c IVF  FEN: reg diet, ensure VTE: SCDs, lovenox ID: no current abx indicated  Dispo:SNF placement pending. Patient needs to mobilize  LOS: 3 days    Brigid Re , [facility] 07/22/2018, 8:44 AM Pager: [phone] Mon-Fri 7:00  am-4:30 pm Sat-Sun 7:00 am-11:30 am

## 2018-07-22 NOTE — Discharge Summary (Signed)
Physician Discharge Summary  Patient ID: Barbara Dennis MRN: [id number] DOB/AGE: 1934-02-27 84 y.o.  Admit date: 07/19/2018 Discharge date: 07/23/2018  Discharge Diagnoses Fall Right 3rd and 4th rib fractures COPD T12 compression fracture AKI, resolved  Consultants Neurosurgery  Procedures None  Hospital Course: 84 year old female seen by the emergency room after a fall last night. She complains of right-sided chest pain. X-ray showed 2 rib fractures of the third and fourth rib on the right. No other work-up was obtained by the EDP. Patient complains of right-sided chest pain with inspiration.  No shortness of breath.  No nausea or vomiting.  She also complains of some mild right upper quadrant abdominal pain as well. No rebound or guarding. She was seen last week the emergency room where CT scan for abdominal pain revealed no abnormality. She is a heavy smoker. Work up in the ED revealed rib fractures. She also complained of RUQ abdominal pain, repeat CT showed T12 compression fracture but no intraabdominal injury. Neurosurgery consulted for fracture and recommended pain control, PT/OT. Patient admitted for pain control and respiratory observation. Therapies evaluated patient and felt she needed SNF at discharge, patient was agreeable to this. On 07/23/18 patient was tolerating diet, voiding appropriately, VSS, pain reasonably well controlled and felt stable for discharge. She is discharged to SNF in good condition. Follow up as outlined below.     Allergies as of 07/23/2018   No Known Allergies     Medication List    STOP taking these medications   acetaminophen 650 MG CR tablet Commonly known as:  TYLENOL Replaced by:  acetaminophen 500 MG tablet   gabapentin 100 MG capsule Commonly known as:  NEURONTIN   meloxicam 7.5 MG tablet Commonly known as:  MOBIC   methocarbamol 500 MG tablet Commonly known as:  ROBAXIN   tiZANidine 2 MG tablet Commonly known as:  ZANAFLEX      TAKE these medications   acetaminophen 500 MG tablet Commonly known as:  TYLENOL Take 2 tablets (1,000 mg total) by mouth every 8 (eight) hours. Replaces:  acetaminophen 650 MG CR tablet   albuterol 108 (90 Base) MCG/ACT inhaler Commonly known as:  PROVENTIL HFA;VENTOLIN HFA Inhale 2 puffs into the lungs every 4 (four) hours as needed for wheezing or shortness of breath (cough, shortness of breath or wheezing.).   diclofenac sodium 1 % Gel Commonly known as:  VOLTAREN Apply 2 g topically 4 (four) times daily.   docusate sodium 100 MG capsule Commonly known as:  COLACE Take 1 capsule (100 mg total) by mouth 2 (two) times daily.   enoxaparin 30 MG/0.3ML injection Commonly known as:  LOVENOX Inject 0.3 mLs (30 mg total) into the skin daily.   feeding supplement (ENSURE ENLIVE) Liqd Take 237 mLs by mouth 2 (two) times daily between meals.   lisinopril 10 MG tablet Commonly known as:  PRINIVIL,ZESTRIL TAKE 1 TABLET(10 MG) BY MOUTH DAILY What changed:    how much to take  how to take this  when to take this  additional instructions   MOVE FREE JOINT HEALTH ADVANCE Tabs Take 1 tablet by mouth 2 (two) times daily. Or as instructed by packaging   multivitamin with minerals Tabs tablet Take 1 tablet by mouth daily.   omeprazole 20 MG capsule Commonly known as:  PRILOSEC Take 1 capsule (20 mg total) by mouth daily. What changed:  when to take this   ondansetron 4 MG disintegrating tablet Commonly known as:  ZOFRAN-ODT Take 1 tablet (4  mg total) by mouth every 6 (six) hours as needed for nausea.   oxyCODONE 5 MG immediate release tablet Commonly known as:  Oxy IR/ROXICODONE Take 0.5 tablets (2.5 mg total) by mouth every 4 (four) hours as needed for severe pain.   PEPTO-BISMOL PO Take 1 capsule by mouth daily as needed (constipation).   polyethylene glycol powder powder Commonly known as:  GLYCOLAX/MIRALAX Take 17 g by mouth 2 (two) times daily as needed.    ranitidine 300 MG tablet Commonly known as:  ZANTAC Take 1 tablet (300 mg total) by mouth at bedtime for 14 days.   traMADol 50 MG tablet Commonly known as:  ULTRAM Take 1 tablet (50 mg total) by mouth every 6 (six) hours as needed for moderate pain. What changed:    when to take this  reasons to take this   traZODone 50 MG tablet Commonly known as:  DESYREL Take 0.5-1 tablets (25-50 mg total) by mouth at bedtime as needed for sleep.   TUMS ULTRA 1000 PO Take 1,000 mg by mouth 2 (two) times daily.   varenicline 0.5 MG tablet Commonly known as:  CHANTIX Take 1 tablet (0.5 mg total) by mouth 2 (two) times daily.        Follow-up Information    Horald Pollen, MD Follow up.   Specialty:  Internal Medicine Why:  Call and arrange follow up appointment with PCP when discharged from SNF.  Contact information: [place] [address] [phone]        [place] Follow up.   Why:  No follow up scheduled. Call as needed.  Contact information: [place] [address] [phone]       Judith Part, MD Follow up.   Specialty:  Neurosurgery Why:  No follow up scheduled. Call as needed.  Contact information: [address] [place] [address] [phone]           Signed: Brigid Re , [facility] 07/23/2018, 10:35 AM Pager: [phone] Mon-Fri 7:00 am-4:30 pm Sat-Sun 7:00 am-11:30 am

## 2018-07-22 NOTE — Plan of Care (Signed)

## 2018-07-22 NOTE — Care Management Important Message (Signed)
Important Message  Patient Details  Name: Barbara Dennis MRN: [id number] Date of Birth: October 11, 1934   Medicare Important Message Given:  Yes    Jeremaine Maraj 07/22/2018, 2:38 PM

## 2018-07-22 NOTE — Progress Notes (Signed)
Initial Nutrition Assessment  DOCUMENTATION CODES:   Underweight  INTERVENTION:   -Continue Ensure Enlive po BID, each supplement provides 350 kcal and 20 grams of protein -MVI with minerals daily  NUTRITION DIAGNOSIS:   Inadequate oral intake related to decreased appetite as evidenced by meal completion < 50%.   GOAL:   Patient will meet greater than or equal to 90% of their needs   MONITOR:   PO intake, Supplement acceptance, Labs, Weight trends, Skin, I & O's  REASON FOR ASSESSMENT:   Other (Comment)(low BMI)    ASSESSMENT:   84 year old female seen by the emergency room physician after a fall last night.   Pt admitted with T12 compression fracture and rt 3-4th rib fractures s/p fall.   Spoke with pt, who was able to answer basic questions. She shares that she has a good appetite PTA and eats well. She consumed "a little egg and a little sausage" for breakfast. Noted bananas and cookies at bedside. Meal completion has been low; noted 10-35%. Case discussed with RN, who reports pt took her Ensure supplements and has not observed any difficulty taking medications, foods, and liquids.   Reviewed wt hx; pt has experienced a 4.3% wt loss over the past 6 months, which is not significant for time frame. Per review of wt hx, pt weighs about 99# at baseline. Pt denies any weight loss. Pt small framed at baseline.   Discussed importance of good meal and supplement intake to promote healing.   Per MD notes, plan for SNF placement once medically stable.   Labs reviewed: Na: 129, CBGS: 144.   NUTRITION - FOCUSED PHYSICAL EXAM:    Most Recent Value  Orbital Region  No depletion  Upper Arm Region  Mild depletion  Thoracic and Lumbar Region  No depletion  Buccal Region  No depletion  Temple Region  Mild depletion  Clavicle Bone Region  Moderate depletion  Clavicle and Acromion Bone Region  Mild depletion  Scapular Bone Region  Mild depletion  Dorsal Hand  Mild depletion   Patellar Region  Mild depletion  Anterior Thigh Region  Mild depletion  Posterior Calf Region  Mild depletion  Edema (RD Assessment)  None  Hair  Reviewed  Eyes  Reviewed  Mouth  Reviewed  Skin  Reviewed  Nails  Reviewed       Diet Order:   Diet Order            Diet regular Room service appropriate? Yes; Fluid consistency: Thin  Diet effective now              EDUCATION NEEDS:   Education needs have been addressed  Skin:  Skin Assessment: Reviewed RN Assessment  Last BM:  07/18/18  Height:   Ht Readings from Last 1 Encounters:  07/19/18 5' (1.524 m)    Weight:   Wt Readings from Last 1 Encounters:  07/19/18 42.6 kg    Ideal Body Weight:  45.5 kg  BMI:  Body mass index is 18.36 kg/m.  Estimated Nutritional Needs:   Kcal:  1100-1300  Protein:  45-60 grams  Fluid:  1.1-1.3 L    Abundio Teuscher A. Jimmye Norman, RD, LDN, CDE Pager: [phone] After hours Pager: [phone]

## 2018-07-22 NOTE — Progress Notes (Signed)
Orthopedic Tech Progress Note Patient Details:  Barbara Dennis Feb 26, 1934 [id number]  Patient ID: Barbara Dennis, female   DOB: Aug 17, 1934, 84 y.o.   MRN: [id number]   [name] 07/22/2018, 12:19 PM Called in bio-tech brace order; spoke with [facility]

## 2018-07-23 ENCOUNTER — Institutional Professional Consult (permissible substitution): Payer: Medicare Other | Admitting: Emergency Medicine

## 2018-07-23 DIAGNOSIS — S22089A Unspecified fracture of T11-T12 vertebra, initial encounter for closed fracture: Secondary | ICD-10-CM | POA: Diagnosis not present

## 2018-07-23 DIAGNOSIS — R112 Nausea with vomiting, unspecified: Secondary | ICD-10-CM | POA: Diagnosis not present

## 2018-07-23 DIAGNOSIS — R1314 Dysphagia, pharyngoesophageal phase: Secondary | ICD-10-CM | POA: Diagnosis not present

## 2018-07-23 DIAGNOSIS — M6281 Muscle weakness (generalized): Secondary | ICD-10-CM | POA: Diagnosis not present

## 2018-07-23 DIAGNOSIS — Z9181 History of falling: Secondary | ICD-10-CM | POA: Diagnosis not present

## 2018-07-23 DIAGNOSIS — J449 Chronic obstructive pulmonary disease, unspecified: Secondary | ICD-10-CM | POA: Diagnosis not present

## 2018-07-23 DIAGNOSIS — S2241XA Multiple fractures of ribs, right side, initial encounter for closed fracture: Secondary | ICD-10-CM | POA: Diagnosis not present

## 2018-07-23 DIAGNOSIS — S2241XD Multiple fractures of ribs, right side, subsequent encounter for fracture with routine healing: Secondary | ICD-10-CM | POA: Diagnosis not present

## 2018-07-23 DIAGNOSIS — M8589 Other specified disorders of bone density and structure, multiple sites: Secondary | ICD-10-CM | POA: Diagnosis not present

## 2018-07-23 DIAGNOSIS — G894 Chronic pain syndrome: Secondary | ICD-10-CM | POA: Diagnosis not present

## 2018-07-23 DIAGNOSIS — I10 Essential (primary) hypertension: Secondary | ICD-10-CM | POA: Diagnosis not present

## 2018-07-23 DIAGNOSIS — N179 Acute kidney failure, unspecified: Secondary | ICD-10-CM | POA: Diagnosis not present

## 2018-07-23 DIAGNOSIS — K219 Gastro-esophageal reflux disease without esophagitis: Secondary | ICD-10-CM | POA: Diagnosis not present

## 2018-07-23 DIAGNOSIS — R2681 Unsteadiness on feet: Secondary | ICD-10-CM | POA: Diagnosis not present

## 2018-07-23 DIAGNOSIS — E871 Hypo-osmolality and hyponatremia: Secondary | ICD-10-CM | POA: Diagnosis not present

## 2018-07-23 DIAGNOSIS — S22080D Wedge compression fracture of T11-T12 vertebra, subsequent encounter for fracture with routine healing: Secondary | ICD-10-CM | POA: Diagnosis not present

## 2018-07-23 DIAGNOSIS — S22089D Unspecified fracture of T11-T12 vertebra, subsequent encounter for fracture with routine healing: Secondary | ICD-10-CM | POA: Diagnosis not present

## 2018-07-23 DIAGNOSIS — Z9981 Dependence on supplemental oxygen: Secondary | ICD-10-CM | POA: Diagnosis not present

## 2018-07-23 DIAGNOSIS — R1013 Epigastric pain: Secondary | ICD-10-CM | POA: Diagnosis not present

## 2018-07-23 LAB — CBC
HEMATOCRIT: 28.3 % — AB (ref 36.0–46.0)
HEMOGLOBIN: 9.4 g/dL — AB (ref 12.0–15.0)
MCH: 31.6 pg (ref 26.0–34.0)
MCHC: 33.2 g/dL (ref 30.0–36.0)
MCV: 95.3 fL (ref 78.0–100.0)
Platelets: 239 10*3/uL (ref 150–400)
RBC: 2.97 MIL/uL — ABNORMAL LOW (ref 3.87–5.11)
RDW: 12.7 % (ref 11.5–15.5)
WBC: 7.8 10*3/uL (ref 4.0–10.5)

## 2018-07-23 LAB — BASIC METABOLIC PANEL
Anion gap: 5 (ref 5–15)
BUN: 26 mg/dL — ABNORMAL HIGH (ref 8–23)
CHLORIDE: 101 mmol/L (ref 98–111)
CO2: 25 mmol/L (ref 22–32)
Calcium: 8.5 mg/dL — ABNORMAL LOW (ref 8.9–10.3)
Creatinine, Ser: 0.93 mg/dL (ref 0.44–1.00)
GFR, EST NON AFRICAN AMERICAN: 56 mL/min — AB (ref >60)
GLUCOSE: 102 mg/dL — AB (ref 70–99)
Potassium: 4.4 mmol/L (ref 3.5–5.1)
Sodium: 131 mmol/L — ABNORMAL LOW (ref 135–145)

## 2018-07-23 MED ORDER — DOCUSATE SODIUM 100 MG PO CAPS: 100.0000 mg | ORAL_CAPSULE | Freq: Two times a day (BID) | ORAL | 0 refills | Status: DC

## 2018-07-23 MED ORDER — POLYETHYLENE GLYCOL 3350 17 G PO PACK
17.0000 g | PACK | Freq: Two times a day (BID) | ORAL | Status: DC
  Administered 2018-07-23: 17 g via ORAL
  Filled 2018-07-23: qty 1

## 2018-07-23 MED ORDER — NICOTINE 14 MG/24HR TD PT24
14.0000 mg | MEDICATED_PATCH | Freq: Every day | TRANSDERMAL | Status: DC
  Administered 2018-07-23: 14 mg via TRANSDERMAL
  Filled 2018-07-23: qty 1

## 2018-07-23 MED ORDER — ACETAMINOPHEN 500 MG PO TABS: 1000.0000 mg | ORAL_TABLET | Freq: Three times a day (TID) | ORAL | 0 refills | Status: DC

## 2018-07-23 MED ORDER — OXYCODONE HCL 5 MG PO TABS: 2.5000 mg | ORAL_TABLET | ORAL | 0 refills | Status: DC | PRN

## 2018-07-23 MED ORDER — ONDANSETRON 4 MG PO TBDP: 4.0000 mg | ORAL_TABLET | Freq: Four times a day (QID) | ORAL | 0 refills | Status: DC | PRN

## 2018-07-23 MED ORDER — ENOXAPARIN SODIUM 30 MG/0.3ML ~~LOC~~ SOLN: 30.0000 mg | SUBCUTANEOUS | Status: DC

## 2018-07-23 MED ORDER — TRAMADOL HCL 50 MG PO TABS: 50.0000 mg | ORAL_TABLET | Freq: Four times a day (QID) | ORAL | 0 refills | Status: DC | PRN

## 2018-07-23 MED ORDER — ENSURE ENLIVE PO LIQD: 237.0000 mL | Freq: Two times a day (BID) | ORAL | 12 refills | Status: DC

## 2018-07-23 NOTE — Progress Notes (Signed)
[facility] Progress Note     Subjective: CC:  C/o R posterior rib pain, worse with movement. Reports some distention and nausea because she has not had a BM in 5 days. We discussed the importance of using IS and getting OOB. Patient is agreeable to SNF.    Objective: Vital signs in last 24 hours: Temp:  [97.8 F (36.6 C)-98.4 F (36.9 C)] 97.8 F (36.6 C) (10/03 0424) Pulse Rate:  [87-105] 87 (10/03 0424) Resp:  [16-19] 16 (10/03 0424) BP: (141-164)/(69-74) 164/74 (10/03 0424) SpO2:  [87 %-94 %] 94 % (10/03 0424) Last BM Date: 07/18/18  Intake/Output from previous day: 10/02 0701 - 10/03 0700 In: 1137 [P.O.:1137] Out: 1500 [Urine:1500] Intake/Output this shift: No intake/output data recorded.  PE: Gen:  Alert, NAD, pleasant and cooperative  Card:  Regular rate and rhythm, pedal pulses 2+ BL Pulm:  Normal effort, clear to auscultation bilaterally Abd: Soft, non-tender, non-distended, bowel sounds present in all 4 quadrants, no organomegaly Skin: warm and dry, no rashes  Psych: A&Ox3   Lab Results:  Recent Labs    07/23/18 0358  WBC 7.8  HGB 9.4*  HCT 28.3*  PLT 239   BMET Recent Labs    07/22/18 0429 07/23/18 0358  NA 129* 131*  K 4.2 4.4  CL 104 101  CO2 23 25  GLUCOSE 114* 102*  BUN 20 26*  CREATININE 0.97 0.93  CALCIUM 8.1* 8.5*   PT/INR No results for input(s): LABPROT, INR in the last 72 hours. CMP     Component Value Date/Time   NA 131 (L) 07/23/2018 0358   NA 128 (L) 03/31/2018 1643   K 4.4 07/23/2018 0358   CL 101 07/23/2018 0358   CO2 25 07/23/2018 0358   GLUCOSE 102 (H) 07/23/2018 0358   BUN 26 (H) 07/23/2018 0358   BUN 14 03/31/2018 1643   CREATININE 0.93 07/23/2018 0358   CREATININE 0.98 (H) 04/02/2016 0903   CALCIUM 8.5 (L) 07/23/2018 0358   PROT 7.7 07/19/2018 1629   PROT 7.9 03/31/2018 1643   ALBUMIN 3.6 07/19/2018 1629   ALBUMIN 4.4 03/31/2018 1643   AST 21 07/19/2018 1629   ALT 17 07/19/2018 1629   ALKPHOS  110 07/19/2018 1629   BILITOT 1.1 07/19/2018 1629   BILITOT 0.4 03/31/2018 1643   GFRNONAA 56 (L) 07/23/2018 0358   GFRAA >60 07/23/2018 0358   Lipase     Component Value Date/Time   LIPASE 64 03/31/2018 1643       Studies/Results: No results found.  Anti-infectives: Anti-infectives (From admission, onward)   None      Assessment/Plan Fall R 3-4th rib fractures- repeat CXR without pneumothorax - IS, pulm toiletry, pain control COPD- wean O2, patient does not wear supplemental oxygen at home T12 compression fracture- NS consulted, no intervention needed. PT/OT AKI- Cr 0.97, improved, d/c IVF ABL anemia - hgb 9.4 from 11, follow  Tobacco use - nicotine patch FEN: reg diet, ensure, hyponatremia (131) - follow  VTE: SCDs, lovenox ID: no current abx indicated    Dispo:SNF placement pending.continue to encourage IS/mobilization. Give Miralax today.    LOS: 4 days    Obie Dredge, [facility] Pager: [phone]

## 2018-07-23 NOTE — Progress Notes (Signed)
Physical Therapy Treatment Patient Details Name: Barbara Dennis MRN: [id number] DOB: November 17, 1933 Today's Date: 07/23/2018    History of Present Illness Pt is an 84 y/o female admitted after falling. Found to have T12 compression fx and 3-4 R rib fxs. PMH includes HTN, COPD, and tobacco use.     PT Comments    Pt making slow progress with functional mobility. She remains limited secondary to pain and fatigue. Pt would continue to benefit from skilled physical therapy services at this time while admitted and after d/c to address the below listed limitations in order to improve overall safety and independence with functional mobility.  Video Interpreter, Cloyd Stagers [phone] translating throughout session.   Follow Up Recommendations  SNF;Supervision/Assistance - 24 hour     Equipment Recommendations  None recommended by PT    Recommendations for Other Services       Precautions / Restrictions Precautions Precautions: Fall;Back Precaution Booklet Issued: No Precaution Comments: Reports multiple falls at home. educated on back precautions  Required Braces or Orthoses: Spinal Brace Spinal Brace: Lumbar corset;Applied in sitting position(for comfort) Restrictions Weight Bearing Restrictions: No    Mobility  Bed Mobility Overal bed mobility: Needs Assistance Bed Mobility: Rolling;Sidelying to Sit Rolling: Mod assist Sidelying to sit: Mod assist       General bed mobility comments: increased time, cueing for log roll, use of bed pads, assist with trunk elevation  Transfers Overall transfer level: Needs assistance Equipment used: Rolling walker (2 wheeled) Transfers: Sit to/from [facility] Sit to Stand: Min assist;+2 safety/equipment Stand pivot transfers: Min assist;+2 safety/equipment       General transfer comment: assist for stability and RW management  Ambulation/Gait                 Stairs             Wheelchair Mobility     Modified Rankin (Stroke Patients Only)       Balance Overall balance assessment: Needs assistance Sitting-balance support: Feet supported Sitting balance-Leahy Scale: Fair Sitting balance - Comments: Reliant on bUE support for sitting balance secondary to pain   Standing balance support: Bilateral upper extremity supported Standing balance-Leahy Scale: Poor Standing balance comment: Reliant on BUE support                             Cognition Arousal/Alertness: Awake/alert Behavior During Therapy: WFL for tasks assessed/performed Overall Cognitive Status: Difficult to assess                                        Exercises      General Comments General comments (skin integrity, edema, etc.): Daughter present during session      Pertinent Vitals/Pain Pain Assessment: Faces Faces Pain Scale: Hurts even more Pain Location: back  Pain Descriptors / Indicators: Grimacing;Guarding Pain Intervention(s): Monitored during session;Repositioned    Home Living                      Prior Function            PT Goals (current goals can now be found in the care plan section) Acute Rehab PT Goals Patient Stated Goal: to decrease pain  PT Goal Formulation: With patient Time For Goal Achievement: 08/03/18 Potential to Achieve Goals: Fair Progress towards PT goals: Progressing toward goals  Frequency    Min 2X/week      PT Plan Current plan remains appropriate    Co-evaluation PT/OT/SLP Co-Evaluation/Treatment: Yes Reason for Co-Treatment: For patient/therapist safety;To address functional/ADL transfers PT goals addressed during session: Mobility/safety with mobility;Balance;Proper use of DME;Strengthening/ROM OT goals addressed during session: ADL's and self-care      AM-PAC PT "6 Clicks" Daily Activity  Outcome Measure  Difficulty turning over in bed (including adjusting bedclothes, sheets and blankets)?:  Unable Difficulty moving from lying on back to sitting on the side of the bed? : Unable Difficulty sitting down on and standing up from a chair with arms (e.g., wheelchair, bedside commode, etc,.)?: Unable Help needed moving to and from a bed to chair (including a wheelchair)?: A Little Help needed walking in hospital room?: A Lot Help needed climbing 3-5 steps with a railing? : Total 6 Click Score: 9    End of Session Equipment Utilized During Treatment: Gait belt;Back brace Activity Tolerance: Patient limited by pain;Patient limited by fatigue Patient left: in chair;with call bell/phone within reach;with family/visitor present Nurse Communication: Mobility status PT Visit Diagnosis: Unsteadiness on feet (R26.81);Repeated falls (R29.6);Muscle weakness (generalized) (M62.81);History of falling (Z91.81);Pain Pain - part of body: (back)     Time: 1137-1202 PT Time Calculation (min) (ACUTE ONLY): 25 min  Charges:  $Therapeutic Activity: 8-22 mins                     Sherie Don, PT, DPT  Acute Rehabilitation Services Pager [phone] Office [place] 07/23/2018, 2:12 PM

## 2018-07-23 NOTE — Progress Notes (Signed)
Occupational Therapy Treatment Patient Details Name: Barbara Dennis MRN: [id number] DOB: 10/25/1933 Today's Date: 07/23/2018    History of present illness Pt is an 84 y/o female admitted after falling. Found to have T12 compression fx and 3-4 R rib fxs. PMH includes HTN, COPD, and tobacco use.    OT comments  Pt progressing towards acute OT goals. Focus of session was bed mobility and simulated toilet transfer (EOB>recliner). Daughter present during session. Video Interpreter, Cloyd Stagers [phone] translating throughout session. D/c plan remains appropriate.    Follow Up Recommendations  SNF    Equipment Recommendations  Other (comment)(defer to next venue)    Recommendations for Other Services      Precautions / Restrictions Precautions Precautions: Fall;Back Precaution Booklet Issued: No Precaution Comments: Reports multiple falls at home. educated on back precautions  Required Braces or Orthoses: Spinal Brace Spinal Brace: Lumbar corset;Applied in sitting position(for comfort) Restrictions Weight Bearing Restrictions: No       Mobility Bed Mobility Overal bed mobility: Needs Assistance Bed Mobility: Rolling;Sidelying to Sit Rolling: Mod assist Sidelying to sit: Mod assist       General bed mobility comments: Used pad to facilitate rolling, assist to powerup trunk.  Transfers Overall transfer level: Needs assistance Equipment used: Rolling walker (2 wheeled) Transfers: Sit to/from Stand Sit to Stand: Min assist         General transfer comment: steadying assist to stand, assist to stabilize rw as pt prefering to have both hands on walker during sit<>stand transition.    Balance Overall balance assessment: Needs assistance Sitting-balance support: Bilateral upper extremity supported;Feet supported Sitting balance-Leahy Scale: Poor Sitting balance - Comments: Reliant on bUE support for sitting balance secondary to pain   Standing balance support: Bilateral upper  extremity supported Standing balance-Leahy Scale: Poor Standing balance comment: Reliant on BUE support                            ADL either performed or assessed with clinical judgement   ADL Overall ADL's : Needs assistance/impaired                     Lower Body Dressing: Maximal assistance               Functional mobility during ADLs: Minimal assistance;+2 for safety/equipment General ADL Comments: Pt completed bed mobility, sat EOB a few miuntes then completed pivotal steps to recliner.      Vision       Perception     Praxis      Cognition Arousal/Alertness: Awake/alert Behavior During Therapy: WFL for tasks assessed/performed Overall Cognitive Status: Difficult to assess                                          Exercises     Shoulder Instructions       General Comments Daughter present during session    Pertinent Vitals/ Pain       Pain Assessment: Faces Faces Pain Scale: Hurts even more Pain Location: back  Pain Descriptors / Indicators: Grimacing;Guarding Pain Intervention(s): Limited activity within patient's tolerance;Monitored during session;Repositioned;RN gave pain meds during session  Home Living  Prior Functioning/Environment              Frequency  Min 2X/week        Progress Toward Goals  OT Goals(current goals can now be found in the care plan section)  Progress towards OT goals: Progressing toward goals  Acute Rehab OT Goals Patient Stated Goal: to decrease pain  OT Goal Formulation: With patient/family Time For Goal Achievement: 08/04/18 Potential to Achieve Goals: Good ADL Goals Pt Will Transfer to Toilet: with min assist;stand pivot transfer;bedside commode Additional ADL Goal #1: Pt will recall back precautions x3 100% Additional ADL Goal #2: Pt will complete bed mobility min (A) as precursor to adls  Plan Discharge  plan remains appropriate    Co-evaluation    PT/OT/SLP Co-Evaluation/Treatment: Yes Reason for Co-Treatment: To address functional/ADL transfers   OT goals addressed during session: ADL's and self-care      AM-PAC PT "6 Clicks" Daily Activity     Outcome Measure   Help from another person eating meals?: A Little Help from another person taking care of personal grooming?: A Lot Help from another person toileting, which includes using toliet, bedpan, or urinal?: A Lot Help from another person bathing (including washing, rinsing, drying)?: A Lot Help from another person to put on and taking off regular upper body clothing?: A Lot Help from another person to put on and taking off regular lower body clothing?: A Lot 6 Click Score: 13    End of Session Equipment Utilized During Treatment: Rolling walker;Oxygen;Back brace  OT Visit Diagnosis: Unsteadiness on feet (R26.81);Repeated falls (R29.6);Muscle weakness (generalized) (M62.81)   Activity Tolerance Patient limited by pain   Patient Left in chair;with call bell/phone within reach;with family/visitor present   Nurse Communication Other (comment)(Nurse present for most of session)        Time: 1130-1153 OT Time Calculation (min): 23 min  Charges: OT General Charges $OT Visit: 1 Visit OT Treatments $Self Care/Home Management : 8-22 mins  Tyrone Schimke, OT Acute Rehabilitation Services Pager: [phone] Office: [phone]    Hortencia Pilar 07/23/2018, 1:00 PM

## 2018-07-23 NOTE — Clinical Social Work Note (Signed)
CSW facilitated patient discharge including contacting patient family and facility to confirm patient discharge plans. Clinical information faxed to facility and family agreeable with plan. CSW arranged ambulance transport via [facility] to [facility]. RN to call report prior to discharge ([phone]).  CSW will sign off for now as social work intervention is no longer needed. Please consult us again if new needs arise.  Jaylinn Hellenbrand, CSW [phone]  

## 2018-07-23 NOTE — Clinical Social Work Placement (Signed)
   CLINICAL SOCIAL WORK PLACEMENT  NOTE  Date:  07/23/2018  Patient Details  Name: Barbara Dennis MRN: [id number] Date of Birth: [birth year]  Clinical Social Work is seeking post-discharge placement for this patient at the [place] level of care (*CSW will initial, date and re-position this form in  chart as items are completed):      Patient/family provided with [facility] Work Department's list of facilities offering this level of care within the geographic area requested by the patient (or if unable, by the patient's family).      Patient/family informed of their freedom to choose among providers that offer the needed level of care, that participate in Medicare, Medicaid or managed care program needed by the patient, have an available bed and are willing to accept the patient.      Patient/family informed of [facility]'s ownership interest in [facility] and [facility], as well as of the fact that they are under no obligation to receive care at these facilities.  PASRR submitted to EDS on 07/20/18     PASRR number received on 07/20/18     Existing PASRR number confirmed on       FL2 transmitted to all facilities in geographic area requested by pt/family on 07/20/18     FL2 transmitted to all facilities within larger geographic area on       Patient informed that his/her managed care company has contracts with or will negotiate with certain facilities, including the following:        Yes   Patient/family informed of bed offers received.  Patient chooses bed at [facility]     Physician recommends and patient chooses bed at      Patient to be transferred to [facility] on 07/23/18.  Patient to be transferred to facility by [facility]     Patient family notified on 07/23/18 of transfer.  Name of family member notified:  Social worker discussed with family earlier today.     PHYSICIAN Please prepare  prescriptions     Additional Comment:    _______________________________________________ Candie Chroman, LCSW 07/23/2018, 2:21 PM

## 2018-07-24 ENCOUNTER — Encounter: Payer: Self-pay | Admitting: Gastroenterology

## 2018-07-24 ENCOUNTER — Non-Acute Institutional Stay (SKILLED_NURSING_FACILITY): Payer: Medicare Other | Admitting: Internal Medicine

## 2018-07-24 ENCOUNTER — Encounter: Payer: Self-pay | Admitting: Internal Medicine

## 2018-07-24 DIAGNOSIS — E871 Hypo-osmolality and hyponatremia: Secondary | ICD-10-CM

## 2018-07-24 DIAGNOSIS — S22080D Wedge compression fracture of T11-T12 vertebra, subsequent encounter for fracture with routine healing: Secondary | ICD-10-CM

## 2018-07-24 DIAGNOSIS — I10 Essential (primary) hypertension: Secondary | ICD-10-CM

## 2018-07-24 DIAGNOSIS — S2241XD Multiple fractures of ribs, right side, subsequent encounter for fracture with routine healing: Secondary | ICD-10-CM

## 2018-07-24 DIAGNOSIS — Z720 Tobacco use: Secondary | ICD-10-CM

## 2018-07-24 DIAGNOSIS — K219 Gastro-esophageal reflux disease without esophagitis: Secondary | ICD-10-CM

## 2018-07-24 DIAGNOSIS — N179 Acute kidney failure, unspecified: Secondary | ICD-10-CM | POA: Diagnosis not present

## 2018-07-24 DIAGNOSIS — F419 Anxiety disorder, unspecified: Secondary | ICD-10-CM

## 2018-07-24 DIAGNOSIS — F5105 Insomnia due to other mental disorder: Secondary | ICD-10-CM

## 2018-07-24 DIAGNOSIS — J449 Chronic obstructive pulmonary disease, unspecified: Secondary | ICD-10-CM

## 2018-07-24 NOTE — Progress Notes (Signed)
:  Location:  [place] Room Number: 111P Place of Service:  SNF (31)  Swetha Rayle D. Sheppard Coil, MD  Patient Care Team: Horald Pollen, MD as PCP - General (Internal Medicine) Melida Quitter, MD as Consulting Physician (Otolaryngology)  Extended Emergency Contact Information Primary Emergency Contact: Leandra Kern of [place] Phone: [phone] Relation: Niece Secondary Emergency Contact: Chung,Won  [place] Mobile Phone: [phone] Relation: None     Allergies: Patient has no known allergies.  Chief Complaint  Patient presents with  . New Admit To SNF    Admit to [facility]    HPI: Patient is 84 y.o. female with COPD, hypertension, GERD who was seen in the emergency department after a fall the night prior.  She complained of right-sided chest pain.  X-ray showed fractures of the right third and fourth rib.  Patient also complaining of some mild right upper quadrant pain.  CT the abdomen showed T12 compression fracture but no intra-abdominal injury.  Patient was admitted to [facility] from 9/29-10/3 where she was seen by neurosurgery.  They recommended pain control.  Therapy evaluate the patient and recommended SNF.  Patient is admitted to skilled nursing facility for OT/PT.  While at skilled nursing facility patient will be followed for COPD treated with albuterol, GERD treated with omeprazole and Zantac and insomnia treated with trazodone  Past Medical History:  Diagnosis Date  . Hypertension   . Mass of right side of neck     Past Surgical History:  Procedure Laterality Date  . APPENDECTOMY      Allergies as of 07/24/2018   No Known Allergies     Medication List        Accurate as of 07/24/18 11:10 AM. Always use your most recent med list.          acetaminophen 500 MG tablet Commonly known as:  TYLENOL Take 2 tablets (1,000 mg total) by mouth every 8 (eight)  hours.   albuterol 108 (90 Base) MCG/ACT inhaler Commonly known as:  PROVENTIL HFA;VENTOLIN HFA Inhale 2 puffs into the lungs every 4 (four) hours as needed for wheezing or shortness of breath (cough, shortness of breath or wheezing.).   diclofenac sodium 1 % Gel Commonly known as:  VOLTAREN Apply 2 g topically 4 (four) times daily.   docusate sodium 100 MG capsule Commonly known as:  COLACE Take 1 capsule (100 mg total) by mouth 2 (two) times daily.   enoxaparin 30 MG/0.3ML injection Commonly known as:  LOVENOX Inject 0.3 mLs (30 mg total) into the skin daily.   feeding supplement (ENSURE ENLIVE) Liqd Take 237 mLs by mouth 2 (two) times daily between meals.   lisinopril 10 MG tablet Commonly known as:  PRINIVIL,ZESTRIL TAKE 1 TABLET(10 MG) BY MOUTH DAILY   MOVE FREE JOINT HEALTH ADVANCE Tabs Take 1 tablet by mouth 2 (two) times daily. Or as instructed by packaging   multivitamin with minerals Tabs tablet Take 1 tablet by mouth daily.   nicotine 21 mg/24hr patch Commonly known as:  NICODERM CQ - dosed in mg/24 hours Place 21 mg onto the skin. Apply 1 patch daily after Removing previous x 6 weeks For Smoking Ceastion   omeprazole 20 MG capsule Commonly known as:  PRILOSEC Take 1 capsule (20 mg total) by mouth daily.   ondansetron 4 MG disintegrating tablet Commonly known as:  ZOFRAN-ODT Take 1 tablet (  4 mg total) by mouth every 6 (six) hours as needed for nausea.   oxyCODONE 5 MG immediate release tablet Commonly known as:  Oxy IR/ROXICODONE Take 0.5 tablets (2.5 mg total) by mouth every 4 (four) hours as needed for severe pain.   PEPTO-BISMOL PO Take 1 capsule by mouth daily as needed (constipation).   polyethylene glycol powder powder Commonly known as:  GLYCOLAX/MIRALAX Take 17 g by mouth 2 (two) times daily as needed.   ranitidine 300 MG tablet Commonly known as:  ZANTAC Take 1 tablet (300 mg total) by mouth at bedtime for 14 days.   traMADol 50 MG  tablet Commonly known as:  ULTRAM Take 1 tablet (50 mg total) by mouth every 6 (six) hours as needed for moderate pain.   traZODone 50 MG tablet Commonly known as:  DESYREL Take 0.5-1 tablets (25-50 mg total) by mouth at bedtime as needed for sleep.   TUMS ULTRA 1000 PO Take 1,000 mg by mouth 2 (two) times daily.       No orders of the defined types were placed in this encounter.   Immunization History  Administered Date(s) Administered  . Influenza,inj,Quad PF,6+ Mos 10/19/2015, 07/03/2018  . Pneumococcal Conjugate-13 07/11/2014    Social History   Tobacco Use  . Smoking status: Current Every Day Smoker    Packs/day: 0.70    Years: 60.00    Pack years: 42.00    Types: Cigarettes  . Smokeless tobacco: Never Used  Substance Use Topics  . Alcohol use: No    Alcohol/week: 0.0 standard drinks    Family history is unable to say anything about her family history with interpreter.  Family History  Problem Relation Age of Onset  . Hypercalcemia Neg Hx       Review of Systems  DATA OBTAINED: from patient, nurse through interpreter and with her limited English as well GENERAL:  no fevers, fatigue, appetite changes SKIN: No itching, or rash EYES: No eye pain, redness, discharge EARS: No earache, tinnitus, change in hearing NOSE: No congestion, drainage or bleeding  MOUTH/THROAT: No mouth or tooth pain, No sore throat RESPIRATORY: No cough, wheezing, SOB CARDIAC: No chest pain, palpitations, lower extremity edema  GI: No abdominal pain, No N/V/D or constipation, No heartburn or reflux  GU: No dysuria, frequency or urgency, or incontinence  MUSCULOSKELETAL: No unrelieved bone/joint pain NEUROLOGIC: No headache, dizziness or focal weakness PSYCHIATRIC: No c/o anxiety or sadness   Vitals:   07/24/18 1100  BP: 126/68  Pulse: 91  Resp: 18  SpO2: 95%    SpO2 Readings from Last 1 Encounters:  07/24/18 95%   Body mass index is 18.36 kg/m.     Physical  Exam  GENERAL APPEARANCE: Alert, conversant,  No acute distress.  SKIN: No diaphoresis rash HEAD: Normocephalic, atraumatic  EYES: Conjunctiva/lids clear. Pupils round, reactive. EOMs intact.  EARS: External exam WNL, canals clear. Hearing grossly normal.  NOSE: No deformity or discharge.  MOUTH/THROAT: Lips w/o lesions  RESPIRATORY: Breathing is even, unlabored. Lung sounds are clear   CARDIOVASCULAR: Heart RRR no murmurs, rubs or gallops. No peripheral edema.   GASTROINTESTINAL: Abdomen is soft, non-tender, not distended w/ normal bowel sounds. GENITOURINARY: Bladder non tender, not distended  MUSCULOSKELETAL: No abnormal joints or musculature B: Wearing chest brace NEUROLOGIC:  Cranial nerves 2-12 grossly intact. Moves all extremities  PSYCHIATRIC: Mood and affect appropriate to situation, no behavioral issues  Patient Active Problem List   Diagnosis Date Noted  . Multiple rib fractures  07/19/2018  . Nausea 07/16/2018  . Gastroesophageal reflux disease 07/16/2018  . Generalized abdominal pain 05/01/2018  . Chronic pain syndrome 01/07/2018  . Chronic obstructive pulmonary disease ([place]) 08/20/2017  . Multinodular goiter 08/15/2017  . Musculoskeletal pain 07/15/2017  . Bilateral leg pain 07/15/2017  . Chronic bilateral low back pain without sciatica 07/15/2017  . Osteopenia of multiple sites 07/15/2017  . Essential hypertension, benign 07/15/2017  . Hypercalcemia 07/15/2017  . Depression with anxiety 01/06/2015  . 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 HTN, goal below 150/90 06/17/2013  . DJD (degenerative joint disease) 02/16/2013  . Tobacco user 09/23/2012  . Menopause 08/21/2012  . Postmenopausal atrophic vaginitis 08/21/2012      Labs reviewed: Basic Metabolic Panel:    Component Value Date/Time   NA 131 (L) 07/23/2018 0358   NA 128 (L) 03/31/2018 1643   K 4.4 07/23/2018 0358   CL 101 07/23/2018 0358   CO2 25 07/23/2018 0358   GLUCOSE 102 (H) 07/23/2018  0358   BUN 26 (H) 07/23/2018 0358   BUN 14 03/31/2018 1643   CREATININE 0.93 07/23/2018 0358   CREATININE 0.98 (H) 04/02/2016 0903   CALCIUM 8.5 (L) 07/23/2018 0358   PROT 7.7 07/19/2018 1629   PROT 7.9 03/31/2018 1643   ALBUMIN 3.6 07/19/2018 1629   ALBUMIN 4.4 03/31/2018 1643   AST 21 07/19/2018 1629   ALT 17 07/19/2018 1629   ALKPHOS 110 07/19/2018 1629   BILITOT 1.1 07/19/2018 1629   BILITOT 0.4 03/31/2018 1643   GFRNONAA 56 (L) 07/23/2018 0358   GFRAA >60 07/23/2018 0358    Recent Labs    07/21/18 0622 07/22/18 0429 07/23/18 0358  NA 128* 129* 131*  K 4.4 4.2 4.4  CL 97* 104 101  CO2 23 23 25   GLUCOSE 127* 114* 102*  BUN 27* 20 26*  CREATININE 1.27* 0.97 0.93  CALCIUM 8.8* 8.1* 8.5*   Liver Function Tests: Recent Labs    03/13/18 1119 03/31/18 1643 07/19/18 1629  AST 16 16 21   ALT 15 13 17   ALKPHOS 61 70 110  BILITOT 0.3 0.4 1.1  PROT 7.5 7.9 7.7  ALBUMIN 4.4 4.4 3.6   Recent Labs    03/31/18 1643  LIPASE 64   No results for input(s): AMMONIA in the last 8760 hours. CBC: Recent Labs    03/13/18 1119  07/19/18 1629 07/20/18 0734 07/23/18 0358  WBC 7.1   < > 14.1* 10.6* 7.8  NEUTROABS 4.4  --  11.9*  --   --   HGB 11.1   < > 12.0 11.1* 9.4*  HCT 34.0   < > 35.6* 33.2* 28.3*  MCV 97   < > 94.4 94.9 95.3  PLT 264  --  217 208 239   < > = values in this interval not displayed.   Lipid No results for input(s): CHOL, HDL, LDLCALC, TRIG in the last 8760 hours.  Cardiac Enzymes: Recent Labs    07/19/18 1629  TROPONINI <0.03   BNP: No results for input(s): BNP in the last 8760 hours. No results found for: MICROALBUR No results found for: HGBA1C Lab Results  Component Value Date   TSH 1.02 03/03/2018   No results found for: VITAMINB12 No results found for: FOLATE No results found for: IRON, TIBC, FERRITIN  Imaging and Procedures obtained prior to SNF admission: Dg Ribs Unilateral W/chest Right  Result Date: 07/19/2018 CLINICAL DATA:   Right-sided pain after fall EXAM: RIGHT RIBS AND CHEST - 3+ VIEW  COMPARISON:  03/31/2018 chest radiograph. FINDINGS: Stable cardiomediastinal silhouette with mild cardiomegaly. No pneumothorax. No pleural effusion. Cephalization of the pulmonary vasculature without overt pulmonary edema. No acute consolidative airspace disease. Acute nondisplaced lateral right third and fourth rib fractures. No suspicious focal osseous lesions. IMPRESSION: Acute nondisplaced lateral right third and fourth rib fractures. No pneumothorax. No hemothorax. Stable mild cardiomegaly without overt pulmonary edema. Electronically Signed   By: Ilona Sorrel M.D.   On: 07/19/2018 18:15   Ct Abdomen Pelvis W Contrast  Result Date: 07/19/2018 CLINICAL DATA:  Abdominal pain, fall EXAM: CT ABDOMEN AND PELVIS WITH CONTRAST TECHNIQUE: Multidetector CT imaging of the abdomen and pelvis was performed using the standard protocol following bolus administration of intravenous contrast. CONTRAST:  159mL OMNIPAQUE IOHEXOL 300 MG/ML  SOLN COMPARISON:  07/14/2018 FINDINGS: Motion degraded images. Lower chest: Mild patchy bilateral lower lobe opacities, right greater than left, likely atelectasis. Hepatobiliary: Scattered subcentimeter hepatic cysts. Gallbladder is unremarkable. No intrahepatic or extrahepatic ductal dilatation. Pancreas: Within normal limits. Spleen: Within normal limits. Adrenals/Urinary Tract: Adrenal glands are within normal limits. 7 mm cyst in the medial right upper kidney (series 8/image 10). Left kidney is within normal limits. No hydronephrosis. Bladder is within normal limits. Stomach/Bowel: Stomach is within normal limits. No evidence of bowel obstruction. Appendix is not discretely visualized, reportedly surgically absent. Left colon is decompressed. Vascular/Lymphatic: No evidence of abdominal aortic aneurysm. Atherosclerotic calcifications of the abdominal aorta and branch vessels. No suspicious abdominopelvic  lymphadenopathy. Reproductive: Uterus and bilateral ovaries are grossly unremarkable. Other: No abdominopelvic ascites. Musculoskeletal: Mild to moderate compression fracture deformity at T12, with approximately 30% loss of height (sagittal image 87). Minimal retropulsion. This is new from recent CT. Mild superior endplate changes at L3, unchanged. Mild degenerative changes of the visualized thoracolumbar spine. IMPRESSION: Mild to moderate compression fracture deformity at T12, new from recent CT. Minimal retropulsion. Additional stable ancillary findings as above. Electronically Signed   By: Julian Hy M.D.   On: 07/19/2018 23:13   Dg Chest Port 1 View  Result Date: 07/20/2018 CLINICAL DATA:  Follow-up rib fractures EXAM: PORTABLE CHEST 1 VIEW COMPARISON:  07/19/2018 FINDINGS: Cardiac shadow is at the upper limits of normal in size. Aortic calcifications are again seen. Calcified lymph nodes are noted within the hila and mediastinum consistent with prior granulomatous disease. Diffuse increased interstitial changes are again noted and stable. Mild scarring is seen. Right-sided rib fractures are again noted without pneumothorax. IMPRESSION: Right rib fractures without new pneumothorax. Mild interstitial changes stable from the previous exam. Changes of prior granulomatous disease. Electronically Signed   By: Inez Catalina M.D.   On: 07/20/2018 08:24     Not all labs, radiology exams or other studies done during hospitalization come through on my EPIC note; however they are reviewed by me.    Assessment and Plan  Fall/rib fracture x2/T12 compression fracture- seen by neurosurgery who recommended pain control and brace SNF -med for OT/PT  AKI/hyponatremia -improved with IV fluid SNF -DC creatinine 0.93, DC BUN 26, DC sodium 131 me: We will follow-up BMP  Hypertension SNF 0 continue lisinopril 10 mg daily  GERD SNF -continue omeprazole 20 mg daily and Zantac 300 mg daily  Insomnia SNF  -continue trazodone 50 mg nightly as needed  COPD SNF -continue albuterol 2 puffs every 4 hours as needed  Tobacco abuse SNF -patient was discharged on Chantix; do not use Chantix I think is dangerous; have started patient on NicoDerm patch 21 mg daily for 6  weeks to start   Time spent greater than 45 minutes;> 50% of time with patient was spent reviewing records, labs, tests and studies, counseling and developing plan of care  Noah Delaine. Sheppard Coil, MD

## 2018-07-28 LAB — CBC AND DIFFERENTIAL
HEMATOCRIT: 31 — AB (ref 36–46)
HEMOGLOBIN: 10.5 — AB (ref 12.0–16.0)
PLATELETS: 365 (ref 150–399)
WBC: 7.5

## 2018-07-28 LAB — BASIC METABOLIC PANEL
BUN: 15 (ref 4–21)
CREATININE: 0.8 (ref 0.5–1.1)
Glucose: 99
Potassium: 4.7 (ref 3.4–5.3)
Sodium: 123 — AB (ref 137–147)

## 2018-08-02 ENCOUNTER — Encounter: Payer: Self-pay | Admitting: Internal Medicine

## 2018-08-02 DIAGNOSIS — E871 Hypo-osmolality and hyponatremia: Secondary | ICD-10-CM | POA: Insufficient documentation

## 2018-08-02 DIAGNOSIS — N179 Acute kidney failure, unspecified: Secondary | ICD-10-CM | POA: Insufficient documentation

## 2018-08-02 DIAGNOSIS — S22080A Wedge compression fracture of T11-T12 vertebra, initial encounter for closed fracture: Secondary | ICD-10-CM | POA: Insufficient documentation

## 2018-08-07 ENCOUNTER — Encounter: Payer: Medicare Other | Admitting: Gastroenterology

## 2018-08-07 ENCOUNTER — Telehealth: Payer: Self-pay | Admitting: *Deleted

## 2018-08-07 NOTE — Telephone Encounter (Signed)
Pt is here today for an EGD.   She had a fall since her office visit and is on oxygen at this time.  She is also on Lovenox for DVT prophylactic.  Per Dr. Tarri Glenn, pt is to be done at the hospital with any of the MD's.  Per her nurse, [facility], at the rehab facility, she had not stopped Lovenox; her last dose was 08-06-18, her scheduled dose is 1100 today.  Per [facility] LPN, pt will need an OV before the EGD.  OV made with Ceasar Mons PA on 08-11-18 at 1:30pm  Note given to interpretor to give to facility regarding appointment time.

## 2018-08-11 ENCOUNTER — Encounter: Payer: Self-pay | Admitting: Gastroenterology

## 2018-08-11 ENCOUNTER — Ambulatory Visit: Payer: Medicare Other | Admitting: Gastroenterology

## 2018-08-11 ENCOUNTER — Encounter (INDEPENDENT_AMBULATORY_CARE_PROVIDER_SITE_OTHER): Payer: Self-pay

## 2018-08-11 VITALS — BP 100/50 | HR 80

## 2018-08-11 DIAGNOSIS — R112 Nausea with vomiting, unspecified: Secondary | ICD-10-CM

## 2018-08-11 DIAGNOSIS — R1013 Epigastric pain: Secondary | ICD-10-CM | POA: Insufficient documentation

## 2018-08-11 DIAGNOSIS — Z9981 Dependence on supplemental oxygen: Secondary | ICD-10-CM | POA: Diagnosis not present

## 2018-08-11 NOTE — Patient Instructions (Addendum)
If you are age 82 or older, your body mass index should be between 23-30. Your There is no height or weight on file to calculate BMI. If this is out of the aforementioned range listed, please consider follow up with your Primary Care Provider.  If you are age 70 or younger, your body mass index should be between 19-25. Your There is no height or weight on file to calculate BMI. If this is out of the aformentioned range listed, please consider follow up with your Primary Care Provider.   You have been scheduled for an endoscopy. Please follow written instructions given to you at your visit today. If you use inhalers (even only as needed), please bring them with you on the day of your procedure. Your physician has requested that you go to www.startemmi.com and enter the access code given to you at your visit today. This web site gives a general overview about your procedure. However, you should still follow specific instructions given to you by our office regarding your preparation for the procedure.  HOLD LOVENOX UNTIL AFTER EGD PROCEDURE.    Thank you for choosing me and [facility].   Alonza Bogus, PA-C

## 2018-08-11 NOTE — H&P (View-Only) (Signed)
08/11/2018 Barbara Dennis [id number] [birth year]   HISTORY OF PRESENT ILLNESS:  This is an 84 year old Asian female with mostly upper GI complaints of poor appetite, nausea, acid reflux, epigastric pain.  Recent CT scan abdomen and pelvis ok.  She was seen by me in the office in September and had been scheduled for EGD in the [facility].  She came to that visit but in the interim was placed on prn O2 supplementation and Lovenox injections after a hospitalization for a fall.  Is here today to reschedule EGD for [facility] hospital with Dr. Tarri Glenn.  By her med rec it appears that she receives her lovenox around 10 AM each morning.   Past Medical History:  Diagnosis Date  . Hypertension   . Mass of right side of neck    Past Surgical History:  Procedure Laterality Date  . APPENDECTOMY      reports that she has been smoking cigarettes. She has a 42.00 pack-year smoking history. She has never used smokeless tobacco. She reports that she does not drink alcohol or use drugs. family history is not on file. No Known Allergies    Outpatient Encounter Medications as of 08/11/2018  Medication Sig  . acetaminophen (TYLENOL) 500 MG tablet Take 2 tablets (1,000 mg total) by mouth every 8 (eight) hours.  Marland Kitchen albuterol (PROVENTIL HFA;VENTOLIN HFA) 108 (90 Base) MCG/ACT inhaler Inhale 2 puffs into the lungs every 4 (four) hours as needed for wheezing or shortness of breath (cough, shortness of breath or wheezing.).  Marland Kitchen Bismuth Subsalicylate (PEPTO-BISMOL PO) Take 1 capsule by mouth daily as needed (constipation).  . Calcium Carbonate Antacid (TUMS ULTRA 1000 PO) Take 1,000 mg by mouth 2 (two) times daily.  . diclofenac sodium (VOLTAREN) 1 % GEL Apply 2 g topically 4 (four) times daily.  Marland Kitchen docusate sodium (COLACE) 100 MG capsule Take 1 capsule (100 mg total) by mouth 2 (two) times daily.  Marland Kitchen enoxaparin (LOVENOX) 30 MG/0.3ML injection Inject 0.3 mLs (30 mg total) into the skin daily.  . feeding supplement, ENSURE  ENLIVE, (ENSURE ENLIVE) LIQD Take 237 mLs by mouth 2 (two) times daily between meals.  . Glucos-Chond-Hyal Ac-Ca Fructo (MOVE FREE JOINT HEALTH ADVANCE) TABS Take 1 tablet by mouth 2 (two) times daily. Or as instructed by packaging  . lisinopril (PRINIVIL,ZESTRIL) 10 MG tablet TAKE 1 TABLET(10 MG) BY MOUTH DAILY (Patient taking differently: Take 10 mg by mouth at bedtime. )  . Multiple Vitamin (MULTIVITAMIN WITH MINERALS) TABS tablet Take 1 tablet by mouth daily.  . nicotine (NICODERM CQ - DOSED IN MG/24 HOURS) 21 mg/24hr patch Place 21 mg onto the skin. Apply 1 patch daily after Removing previous x 6 weeks For Smoking Ceastion  . omeprazole (PRILOSEC) 20 MG capsule Take 1 capsule (20 mg total) by mouth daily. (Patient taking differently: Take 20 mg by mouth at bedtime. )  . ondansetron (ZOFRAN-ODT) 4 MG disintegrating tablet Take 1 tablet (4 mg total) by mouth every 6 (six) hours as needed for nausea.  . polyethylene glycol powder (GLYCOLAX/MIRALAX) powder Take 17 g by mouth 2 (two) times daily as needed.  . [DISCONTINUED] oxyCODONE (OXY IR/ROXICODONE) 5 MG immediate release tablet Take 0.5 tablets (2.5 mg total) by mouth every 4 (four) hours as needed for severe pain.  . [DISCONTINUED] ranitidine (ZANTAC) 300 MG tablet Take 1 tablet (300 mg total) by mouth at bedtime for 14 days.  . [DISCONTINUED] traMADol (ULTRAM) 50 MG tablet Take 1 tablet (50 mg total)  by mouth every 6 (six) hours as needed for moderate pain.  . [DISCONTINUED] traZODone (DESYREL) 50 MG tablet Take 0.5-1 tablets (25-50 mg total) by mouth at bedtime as needed for sleep.   No facility-administered encounter medications on file as of 08/11/2018.      REVIEW OF SYSTEMS  : All other systems reviewed and negative except where noted in the History of Present Illness.   PHYSICAL EXAM: BP (!) 100/50   Pulse 80  General: Well developed Asian female in no acute distress Head: Normocephalic and atraumatic Eyes:  Sclerae anicteric,  conjunctiva pink. Ears: Normal auditory acuity. Lungs: Clear throughout to auscultation; no increased WOB. Heart: Regular rate and rhythm; no M/R/G. Abdomen: Soft, non-distended.  BS present.  Non-tender. Musculoskeletal: Symmetrical with no gross deformities  Skin: No lesions on visible extremities Extremities: No edema  Neurological: Alert oriented x 4, grossly non-focal Psychological:  Alert and cooperative. Normal mood and affect  ASSESSMENT AND PLAN: *84 year old Asian female with mostly upper GI complaints of poor appetite, nausea, acid reflux, epigastric pain.  Recent CT scan abdomen and pelvis ok.  She had been scheduled for EGD and came to that visit but in the interim was placed on prn O2 supplementation and Lovenox injections after a hospitalization for a fall.  Is here today to reschedule EGD for [facility] hospital with Dr. Tarri Glenn.  She will hold her Lovenox injection the day of the procedure until her procedure is complete (receives lovenox at 10 AM and is scheduled for 1115 AM procedure so will be just over 24 hours).   **The risks, benefits, and alternatives to EGD were discussed with the patient and she consents to proceed.    CC:  Horald Pollen, [place]

## 2018-08-11 NOTE — Progress Notes (Signed)
08/11/2018 Barbara Dennis [id number] 1934/08/30   HISTORY OF PRESENT ILLNESS:  This is an 84 year old Asian female with mostly upper GI complaints of poor appetite, nausea, acid reflux, epigastric pain.  Recent CT scan abdomen and pelvis ok.  She was seen by me in the office in September and had been scheduled for EGD in the [facility].  She came to that visit but in the interim was placed on prn O2 supplementation and Lovenox injections after a hospitalization for a fall.  Is here today to reschedule EGD for [facility] hospital with Dr. Tarri Glenn.  By her med rec it appears that she receives her lovenox around 10 AM each morning.   Past Medical History:  Diagnosis Date  . Hypertension   . Mass of right side of neck    Past Surgical History:  Procedure Laterality Date  . APPENDECTOMY      reports that she has been smoking cigarettes. She has a 42.00 pack-year smoking history. She has never used smokeless tobacco. She reports that she does not drink alcohol or use drugs. family history is not on file. No Known Allergies    Outpatient Encounter Medications as of 08/11/2018  Medication Sig  . acetaminophen (TYLENOL) 500 MG tablet Take 2 tablets (1,000 mg total) by mouth every 8 (eight) hours.  Marland Kitchen albuterol (PROVENTIL HFA;VENTOLIN HFA) 108 (90 Base) MCG/ACT inhaler Inhale 2 puffs into the lungs every 4 (four) hours as needed for wheezing or shortness of breath (cough, shortness of breath or wheezing.).  Marland Kitchen Bismuth Subsalicylate (PEPTO-BISMOL PO) Take 1 capsule by mouth daily as needed (constipation).  . Calcium Carbonate Antacid (TUMS ULTRA 1000 PO) Take 1,000 mg by mouth 2 (two) times daily.  . diclofenac sodium (VOLTAREN) 1 % GEL Apply 2 g topically 4 (four) times daily.  Marland Kitchen docusate sodium (COLACE) 100 MG capsule Take 1 capsule (100 mg total) by mouth 2 (two) times daily.  Marland Kitchen enoxaparin (LOVENOX) 30 MG/0.3ML injection Inject 0.3 mLs (30 mg total) into the skin daily.  . feeding supplement, ENSURE  ENLIVE, (ENSURE ENLIVE) LIQD Take 237 mLs by mouth 2 (two) times daily between meals.  . Glucos-Chond-Hyal Ac-Ca Fructo (MOVE FREE JOINT HEALTH ADVANCE) TABS Take 1 tablet by mouth 2 (two) times daily. Or as instructed by packaging  . lisinopril (PRINIVIL,ZESTRIL) 10 MG tablet TAKE 1 TABLET(10 MG) BY MOUTH DAILY (Patient taking differently: Take 10 mg by mouth at bedtime. )  . Multiple Vitamin (MULTIVITAMIN WITH MINERALS) TABS tablet Take 1 tablet by mouth daily.  . nicotine (NICODERM CQ - DOSED IN MG/24 HOURS) 21 mg/24hr patch Place 21 mg onto the skin. Apply 1 patch daily after Removing previous x 6 weeks For Smoking Ceastion  . omeprazole (PRILOSEC) 20 MG capsule Take 1 capsule (20 mg total) by mouth daily. (Patient taking differently: Take 20 mg by mouth at bedtime. )  . ondansetron (ZOFRAN-ODT) 4 MG disintegrating tablet Take 1 tablet (4 mg total) by mouth every 6 (six) hours as needed for nausea.  . polyethylene glycol powder (GLYCOLAX/MIRALAX) powder Take 17 g by mouth 2 (two) times daily as needed.  . [DISCONTINUED] oxyCODONE (OXY IR/ROXICODONE) 5 MG immediate release tablet Take 0.5 tablets (2.5 mg total) by mouth every 4 (four) hours as needed for severe pain.  . [DISCONTINUED] ranitidine (ZANTAC) 300 MG tablet Take 1 tablet (300 mg total) by mouth at bedtime for 14 days.  . [DISCONTINUED] traMADol (ULTRAM) 50 MG tablet Take 1 tablet (50 mg total)  by mouth every 6 (six) hours as needed for moderate pain.  . [DISCONTINUED] traZODone (DESYREL) 50 MG tablet Take 0.5-1 tablets (25-50 mg total) by mouth at bedtime as needed for sleep.   No facility-administered encounter medications on file as of 08/11/2018.      REVIEW OF SYSTEMS  : All other systems reviewed and negative except where noted in the History of Present Illness.   PHYSICAL EXAM: BP (!) 100/50   Pulse 80  General: Well developed Asian female in no acute distress Head: Normocephalic and atraumatic Eyes:  Sclerae anicteric,  conjunctiva pink. Ears: Normal auditory acuity. Lungs: Clear throughout to auscultation; no increased WOB. Heart: Regular rate and rhythm; no M/R/G. Abdomen: Soft, non-distended.  BS present.  Non-tender. Musculoskeletal: Symmetrical with no gross deformities  Skin: No lesions on visible extremities Extremities: No edema  Neurological: Alert oriented x 4, grossly non-focal Psychological:  Alert and cooperative. Normal mood and affect  ASSESSMENT AND PLAN: *84 year old Asian female with mostly upper GI complaints of poor appetite, nausea, acid reflux, epigastric pain.  Recent CT scan abdomen and pelvis ok.  She had been scheduled for EGD and came to that visit but in the interim was placed on prn O2 supplementation and Lovenox injections after a hospitalization for a fall.  Is here today to reschedule EGD for [facility] hospital with Dr. Tarri Glenn.  She will hold her Lovenox injection the day of the procedure until her procedure is complete (receives lovenox at 10 AM and is scheduled for 1115 AM procedure so will be just over 24 hours).   **The risks, benefits, and alternatives to EGD were discussed with the patient and she consents to proceed.    CC:  Horald Pollen, [place]

## 2018-08-11 NOTE — Progress Notes (Signed)
Reviewed. I agree with documentation including the assessment and plan.  Shirlie Enck L. Daylyn Azbill, MD, MPH 

## 2018-08-12 ENCOUNTER — Telehealth: Payer: Self-pay | Admitting: Gastroenterology

## 2018-08-12 NOTE — Telephone Encounter (Signed)
Spoke with Monette, RN - she states instructions are very clear.  Not sure what discrepancy is about.

## 2018-08-12 NOTE — Telephone Encounter (Signed)
Rehab calling regarding instructions for pt's procedure. They stated that there is a discrepancy in the instructions that needs clarification.

## 2018-08-13 DIAGNOSIS — R2681 Unsteadiness on feet: Secondary | ICD-10-CM | POA: Diagnosis not present

## 2018-08-13 DIAGNOSIS — S22089D Unspecified fracture of T11-T12 vertebra, subsequent encounter for fracture with routine healing: Secondary | ICD-10-CM | POA: Diagnosis not present

## 2018-08-13 DIAGNOSIS — Z9181 History of falling: Secondary | ICD-10-CM | POA: Diagnosis not present

## 2018-08-13 DIAGNOSIS — M6281 Muscle weakness (generalized): Secondary | ICD-10-CM | POA: Diagnosis not present

## 2018-08-13 DIAGNOSIS — S2241XD Multiple fractures of ribs, right side, subsequent encounter for fracture with routine healing: Secondary | ICD-10-CM | POA: Diagnosis not present

## 2018-08-14 DIAGNOSIS — R2681 Unsteadiness on feet: Secondary | ICD-10-CM | POA: Diagnosis not present

## 2018-08-14 DIAGNOSIS — S22089D Unspecified fracture of T11-T12 vertebra, subsequent encounter for fracture with routine healing: Secondary | ICD-10-CM | POA: Diagnosis not present

## 2018-08-14 DIAGNOSIS — S2241XD Multiple fractures of ribs, right side, subsequent encounter for fracture with routine healing: Secondary | ICD-10-CM | POA: Diagnosis not present

## 2018-08-14 DIAGNOSIS — Z9181 History of falling: Secondary | ICD-10-CM | POA: Diagnosis not present

## 2018-08-14 DIAGNOSIS — M6281 Muscle weakness (generalized): Secondary | ICD-10-CM | POA: Diagnosis not present

## 2018-08-17 DIAGNOSIS — M6281 Muscle weakness (generalized): Secondary | ICD-10-CM | POA: Diagnosis not present

## 2018-08-17 DIAGNOSIS — R2681 Unsteadiness on feet: Secondary | ICD-10-CM | POA: Diagnosis not present

## 2018-08-17 DIAGNOSIS — Z9181 History of falling: Secondary | ICD-10-CM | POA: Diagnosis not present

## 2018-08-17 DIAGNOSIS — S2241XD Multiple fractures of ribs, right side, subsequent encounter for fracture with routine healing: Secondary | ICD-10-CM | POA: Diagnosis not present

## 2018-08-17 DIAGNOSIS — S22089D Unspecified fracture of T11-T12 vertebra, subsequent encounter for fracture with routine healing: Secondary | ICD-10-CM | POA: Diagnosis not present

## 2018-08-18 ENCOUNTER — Other Ambulatory Visit: Payer: Self-pay | Admitting: Internal Medicine

## 2018-08-18 ENCOUNTER — Other Ambulatory Visit: Payer: Self-pay

## 2018-08-18 ENCOUNTER — Encounter (HOSPITAL_COMMUNITY): Payer: Self-pay

## 2018-08-18 ENCOUNTER — Emergency Department (HOSPITAL_COMMUNITY): Payer: Medicare Other

## 2018-08-18 ENCOUNTER — Emergency Department (HOSPITAL_COMMUNITY)
Admission: EM | Admit: 2018-08-18 | Discharge: 2018-08-18 | Disposition: A | Discharge status: Home / Self Care | Payer: Medicare Other | Attending: Emergency Medicine | Admitting: Emergency Medicine

## 2018-08-18 ENCOUNTER — Ambulatory Visit (HOSPITAL_COMMUNITY)
Admission: RE | Admit: 2018-08-18 | Discharge: 2018-08-18 | Disposition: A | Discharge status: Home / Self Care | Payer: Medicare Other | Source: Ambulatory Visit | Attending: Internal Medicine | Admitting: Internal Medicine

## 2018-08-18 ENCOUNTER — Encounter (HOSPITAL_COMMUNITY): Payer: Self-pay | Admitting: Emergency Medicine

## 2018-08-18 DIAGNOSIS — I10 Essential (primary) hypertension: Secondary | ICD-10-CM | POA: Insufficient documentation

## 2018-08-18 DIAGNOSIS — Z79899 Other long term (current) drug therapy: Secondary | ICD-10-CM | POA: Insufficient documentation

## 2018-08-18 DIAGNOSIS — S199XXA Unspecified injury of neck, initial encounter: Secondary | ICD-10-CM | POA: Diagnosis not present

## 2018-08-18 DIAGNOSIS — F17210 Nicotine dependence, cigarettes, uncomplicated: Secondary | ICD-10-CM | POA: Diagnosis not present

## 2018-08-18 DIAGNOSIS — Z743 Need for continuous supervision: Secondary | ICD-10-CM | POA: Diagnosis not present

## 2018-08-18 DIAGNOSIS — W19XXXA Unspecified fall, initial encounter: Secondary | ICD-10-CM

## 2018-08-18 DIAGNOSIS — S0990XA Unspecified injury of head, initial encounter: Secondary | ICD-10-CM

## 2018-08-18 DIAGNOSIS — J449 Chronic obstructive pulmonary disease, unspecified: Secondary | ICD-10-CM | POA: Diagnosis not present

## 2018-08-18 DIAGNOSIS — S2241XD Multiple fractures of ribs, right side, subsequent encounter for fracture with routine healing: Secondary | ICD-10-CM | POA: Diagnosis not present

## 2018-08-18 DIAGNOSIS — Z9181 History of falling: Secondary | ICD-10-CM

## 2018-08-18 DIAGNOSIS — R51 Headache: Secondary | ICD-10-CM | POA: Diagnosis not present

## 2018-08-18 DIAGNOSIS — R2681 Unsteadiness on feet: Secondary | ICD-10-CM | POA: Diagnosis not present

## 2018-08-18 DIAGNOSIS — S22089D Unspecified fracture of T11-T12 vertebra, subsequent encounter for fracture with routine healing: Secondary | ICD-10-CM | POA: Diagnosis not present

## 2018-08-18 DIAGNOSIS — S0003XA Contusion of scalp, initial encounter: Secondary | ICD-10-CM | POA: Diagnosis not present

## 2018-08-18 DIAGNOSIS — M6281 Muscle weakness (generalized): Secondary | ICD-10-CM | POA: Diagnosis not present

## 2018-08-18 DIAGNOSIS — R279 Unspecified lack of coordination: Secondary | ICD-10-CM | POA: Diagnosis not present

## 2018-08-18 NOTE — ED Notes (Signed)
Patient transported to CT 

## 2018-08-18 NOTE — ED Notes (Signed)
[facility] called  

## 2018-08-18 NOTE — ED Notes (Signed)
[facility] here to take pt back to [facility]

## 2018-08-18 NOTE — ED Triage Notes (Signed)
Per EMS, pt from [facility].  Staff reports fall last night.  Hematoma to back of head per EMS.  Pt can recall the fall last night, states she got up and went back to bed after the fall.  She is A&Ox 4.  With some language barrier.  She is on lovenox.

## 2018-08-18 NOTE — Discharge Instructions (Signed)
Follow up with PCP doctor about parotid mass/neck mass (chronic)

## 2018-08-18 NOTE — ED Notes (Signed)
Assisted pt in using BSC-pt's gait is unsteady.

## 2018-08-18 NOTE — ED Provider Notes (Signed)
[facility] EMERGENCY DEPARTMENT Provider Note   CSN: [id number] Arrival date & time: 08/18/18  1739     History   Chief Complaint Chief Complaint  Patient presents with  . Fall    HPI Barbara Dennis is a 84 y.o. female.  The history is provided by the patient and the EMS personnel.  Fall  This is a new problem. The current episode started yesterday. The problem has not changed since onset.Associated symptoms include headaches. Pertinent negatives include no chest pain, no abdominal pain and no shortness of breath. Associated symptoms comments: Hematoma to back of head, fall last night and likely rolled out of bed. Has been ambulatory today at nursing home but due to hematoma sent for eval. . Nothing aggravates the symptoms. Nothing relieves the symptoms. The treatment provided no relief.    Past Medical History:  Diagnosis Date  . Hypertension   . Mass of right side of neck     Patient Active Problem List   Diagnosis Date Noted  . Epigastric pain 08/11/2018  . O2 dependent 08/11/2018  . T12 compression fracture ([place]) 08/02/2018  . Acute kidney injury ([place]) 08/02/2018  . Hyponatremia 08/02/2018  . Multiple rib fractures 07/19/2018  . Nausea and vomiting 07/16/2018  . Gastroesophageal reflux disease 07/16/2018  . Generalized abdominal pain 05/01/2018  . Chronic pain syndrome 01/07/2018  . Chronic obstructive pulmonary disease ([place]) 08/20/2017  . Multinodular goiter 08/15/2017  . Musculoskeletal pain 07/15/2017  . Bilateral leg pain 07/15/2017  . Chronic bilateral low back pain without sciatica 07/15/2017  . Osteopenia of multiple sites 07/15/2017  . Essential hypertension, benign 07/15/2017  . Hypercalcemia 07/15/2017  . Depression with anxiety 01/06/2015  . 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 HTN, goal below 150/90 06/17/2013  . DJD (degenerative joint disease) 02/16/2013  . Tobacco user 09/23/2012  . Menopause 08/21/2012  .  Postmenopausal atrophic vaginitis 08/21/2012    Past Surgical History:  Procedure Laterality Date  . APPENDECTOMY       OB History   None      Home Medications    Prior to Admission medications   Medication Sig Start Date End Date Taking? Authorizing Provider  acetaminophen (TYLENOL) 500 MG tablet Take 2 tablets (1,000 mg total) by mouth every 8 (eight) hours. 07/23/18   Jill Alexanders, PA-C  albuterol (PROVENTIL HFA;VENTOLIN HFA) 108 (90 Base) MCG/ACT inhaler Inhale 2 puffs into the lungs every 4 (four) hours as needed for wheezing or shortness of breath (cough, shortness of breath or wheezing.). 01/21/17   Ivar Drape D, PA  Bismuth Subsalicylate (PEPTO-BISMOL PO) Take 1 capsule by mouth daily as needed (constipation).    [provider]  Calcium Carbonate Antacid (TUMS ULTRA 1000 PO) Take 1,000 mg by mouth 2 (two) times daily.    [provider]  diclofenac sodium (VOLTAREN) 1 % GEL Apply 2 g topically 4 (four) times daily. 06/12/18   Coralyn Helling, DO  docusate sodium (COLACE) 100 MG capsule Take 1 capsule (100 mg total) by mouth 2 (two) times daily. 07/23/18   Jill Alexanders, PA-C  enoxaparin (LOVENOX) 30 MG/0.3ML injection Inject 0.3 mLs (30 mg total) into the skin daily. 07/23/18   Jill Alexanders, PA-C  feeding supplement, ENSURE ENLIVE, (ENSURE ENLIVE) LIQD Take 237 mLs by mouth 2 (two) times daily between meals. 07/23/18   Jill Alexanders, PA-C  Glucos-Chond-Hyal Ac-Ca Fructo (MOVE FREE JOINT HEALTH ADVANCE) TABS Take 1 tablet by mouth 2 (two)  times daily. Or as instructed by packaging 02/22/16   Shawnee Knapp, MD  lisinopril (PRINIVIL,ZESTRIL) 10 MG tablet TAKE 1 TABLET(10 MG) BY MOUTH DAILY Patient taking differently: Take 10 mg by mouth at bedtime.  07/15/17   Horald Pollen, MD  Multiple Vitamin (MULTIVITAMIN WITH MINERALS) TABS tablet Take 1 tablet by mouth daily.    [provider]  nicotine (NICODERM CQ - DOSED IN MG/24  HOURS) 21 mg/24hr patch Place 21 mg onto the skin. Apply 1 patch daily after Removing previous x 6 weeks For Smoking Ceastion    [provider]  omeprazole (PRILOSEC) 20 MG capsule Take 1 capsule (20 mg total) by mouth daily. Patient taking differently: Take 20 mg by mouth at bedtime.  07/03/18   Horald Pollen, MD  ondansetron (ZOFRAN-ODT) 4 MG disintegrating tablet Take 1 tablet (4 mg total) by mouth every 6 (six) hours as needed for nausea. 07/23/18   Jill Alexanders, PA-C  polyethylene glycol powder (GLYCOLAX/MIRALAX) powder Take 17 g by mouth 2 (two) times daily as needed. 02/27/17   Joretta Bachelor, PA    Family History Family History  Problem Relation Age of Onset  . Hypercalcemia Neg Hx     Social History Social History   Tobacco Use  . Smoking status: Current Every Day Smoker    Packs/day: 0.70    Years: 60.00    Pack years: 42.00    Types: Cigarettes  . Smokeless tobacco: Never Used  Substance Use Topics  . Alcohol use: No    Alcohol/week: 0.0 standard drinks  . Drug use: No     Allergies   Patient has no known allergies.   Review of Systems Review of Systems  Constitutional: Negative for chills and fever.  HENT: Negative for ear pain and sore throat.   Eyes: Negative for pain and visual disturbance.  Respiratory: Negative for cough and shortness of breath.   Cardiovascular: Negative for chest pain and palpitations.  Gastrointestinal: Negative for abdominal pain and vomiting.  Genitourinary: Negative for dysuria and hematuria.  Musculoskeletal: Negative for arthralgias, back pain and neck pain.  Skin: Negative for color change and rash.  Neurological: Positive for headaches. Negative for seizures and syncope.  All other systems reviewed and are negative.    Physical Exam Updated Vital Signs  ED Triage Vitals  Enc Vitals Group     BP      Pulse      Resp      Temp      Temp src      SpO2      Weight      Height      Head  Circumference      Peak Flow      Pain Score      Pain Loc      Pain Edu?      Excl. in [place]?     Physical Exam  Constitutional: She is oriented to person, place, and time. She appears well-developed and well-nourished. No distress.  HENT:  Right Ear: External ear normal.  Left Ear: External ear normal.  Mouth/Throat: Oropharynx is clear and moist. No oropharyngeal exudate.  Posterior occiput hematoma  Eyes: Pupils are equal, round, and reactive to light. Conjunctivae and EOM are normal.  Neck: Normal range of motion. Neck supple.  Cardiovascular: Normal rate, regular rhythm, normal heart sounds and intact distal pulses.  No murmur heard. Pulmonary/Chest: Effort normal and breath sounds normal. No respiratory distress.  Abdominal: Soft. There is no tenderness.  Musculoskeletal: Normal range of motion. She exhibits no edema or deformity.  TTP to hematoma on back of head, no midline spinal tenderness  Neurological: She is alert and oriented to person, place, and time. No cranial nerve deficit or sensory deficit. She exhibits normal muscle tone. Coordination normal.  5+/5 strength, normal sensation, no drift  Skin: Skin is warm and dry. Capillary refill takes less than 2 seconds.  Psychiatric: She has a normal mood and affect.  Nursing note and vitals reviewed.    ED Treatments / Results  Labs (all labs ordered are listed, but only abnormal results are displayed) Labs Reviewed - No data to display  EKG None  Radiology Ct Head Wo Contrast  Result Date: 08/18/2018 CLINICAL DATA:  Fall last night. Posterior scalp hematoma. Initial encounter. EXAM: CT HEAD WITHOUT CONTRAST CT CERVICAL SPINE WITHOUT CONTRAST TECHNIQUE: Multidetector CT imaging of the head and cervical spine was performed following the standard protocol without intravenous contrast. Multiplanar CT image reconstructions of the cervical spine were also generated. COMPARISON:  Cervical spine radiographs 09/24/2017. Soft  tissue neck CT 12/14/2013. FINDINGS: CT HEAD FINDINGS Brain: Lacunar infarcts in the left greater than right basal ganglia are likely chronic in this setting. No acute large territory infarct, intracranial hemorrhage, mass, or midline shift is identified. Asymmetric extra-axial CSF along the falx in the posterior left frontal region measures 9 mm in left-to-right thickness with mild local mass effect on the adjacent superior frontal gyrus. Scattered cerebral white matter hypodensities are nonspecific but compatible with mild chronic small vessel ischemic disease. There is mild cerebral atrophy. Vascular: Calcified atherosclerosis at the skull base. Skull: No fracture or suspicious osseous lesion. Sinuses/Orbits: Visualized paranasal sinuses and mastoid air cells are clear. The right frontal sinus is hypoplastic. Bilateral cataract extraction is noted. Other: Mild posterior scalp soft tissue swelling in the midline. CT CERVICAL SPINE FINDINGS Alignment: Chronic grade 1 retrolisthesis of C5 on C6, degenerative. Skull base and vertebrae: No acute fracture or destructive osseous process. Soft tissues and spinal canal: No prevertebral fluid or swelling. No visible canal hematoma. Disc levels: Chronic severe C5-6 disc degeneration with complete disc space height loss and prominent degenerative endplate sclerosis and spurring. At least moderate spinal stenosis at C5-6 due to disc bulging and retrolisthesis. Chronic, partially calcified central C3-4 disc extrusion resulting in mild-to-moderate spinal stenosis. Multilevel facet arthrosis, moderate to severe on the left at C4-5 with moderate left neural foraminal stenosis. Upper chest: Unchanged 2 mm right upper lobe lung nodule. Aortic arch atherosclerosis. Other: Moderately severe calcified atherosclerosis at the carotid bifurcations. Interval enlargement of deep lobe left parotid mass, now 2.2 x 1.8 cm. Interval enlargement of right neck mass located posterior to the  submandibular gland and deep to the parotid tail and sternocleidomastoid muscle, now 3.3 x 2.1 cm. IMPRESSION: 1. No acute intracranial hemorrhage. 2. Asymmetric extra-axial CSF along the falx in the left frontal region, possibly a small arachnoid cyst or subdural hygroma. 3. Mild chronic small vessel ischemic disease. Bilateral basal ganglia lacunar infarcts, likely chronic. 4. Mild posterior scalp soft tissue swelling. 5. No acute fracture or traumatic subluxation identified in the cervical spine. 6. Interval enlargement of left parotid and right neck masses. Slow growth from 2015 favors a benign etiology such as Warthin's tumors, however correlate with previous FNA results. Electronically Signed   By: Logan Bores M.D.   On: 08/18/2018 19:51   Ct Cervical Spine Wo Contrast  Result Date: 08/18/2018 CLINICAL  DATA:  Fall last night. Posterior scalp hematoma. Initial encounter. EXAM: CT HEAD WITHOUT CONTRAST CT CERVICAL SPINE WITHOUT CONTRAST TECHNIQUE: Multidetector CT imaging of the head and cervical spine was performed following the standard protocol without intravenous contrast. Multiplanar CT image reconstructions of the cervical spine were also generated. COMPARISON:  Cervical spine radiographs 09/24/2017. Soft tissue neck CT 12/14/2013. FINDINGS: CT HEAD FINDINGS Brain: Lacunar infarcts in the left greater than right basal ganglia are likely chronic in this setting. No acute large territory infarct, intracranial hemorrhage, mass, or midline shift is identified. Asymmetric extra-axial CSF along the falx in the posterior left frontal region measures 9 mm in left-to-right thickness with mild local mass effect on the adjacent superior frontal gyrus. Scattered cerebral white matter hypodensities are nonspecific but compatible with mild chronic small vessel ischemic disease. There is mild cerebral atrophy. Vascular: Calcified atherosclerosis at the skull base. Skull: No fracture or suspicious osseous lesion.  Sinuses/Orbits: Visualized paranasal sinuses and mastoid air cells are clear. The right frontal sinus is hypoplastic. Bilateral cataract extraction is noted. Other: Mild posterior scalp soft tissue swelling in the midline. CT CERVICAL SPINE FINDINGS Alignment: Chronic grade 1 retrolisthesis of C5 on C6, degenerative. Skull base and vertebrae: No acute fracture or destructive osseous process. Soft tissues and spinal canal: No prevertebral fluid or swelling. No visible canal hematoma. Disc levels: Chronic severe C5-6 disc degeneration with complete disc space height loss and prominent degenerative endplate sclerosis and spurring. At least moderate spinal stenosis at C5-6 due to disc bulging and retrolisthesis. Chronic, partially calcified central C3-4 disc extrusion resulting in mild-to-moderate spinal stenosis. Multilevel facet arthrosis, moderate to severe on the left at C4-5 with moderate left neural foraminal stenosis. Upper chest: Unchanged 2 mm right upper lobe lung nodule. Aortic arch atherosclerosis. Other: Moderately severe calcified atherosclerosis at the carotid bifurcations. Interval enlargement of deep lobe left parotid mass, now 2.2 x 1.8 cm. Interval enlargement of right neck mass located posterior to the submandibular gland and deep to the parotid tail and sternocleidomastoid muscle, now 3.3 x 2.1 cm. IMPRESSION: 1. No acute intracranial hemorrhage. 2. Asymmetric extra-axial CSF along the falx in the left frontal region, possibly a small arachnoid cyst or subdural hygroma. 3. Mild chronic small vessel ischemic disease. Bilateral basal ganglia lacunar infarcts, likely chronic. 4. Mild posterior scalp soft tissue swelling. 5. No acute fracture or traumatic subluxation identified in the cervical spine. 6. Interval enlargement of left parotid and right neck masses. Slow growth from 2015 favors a benign etiology such as Warthin's tumors, however correlate with previous FNA results. Electronically Signed    By: Logan Bores M.D.   On: 08/18/2018 19:51    Procedures Procedures (including critical care time)  Medications Ordered in ED Medications - No data to display   Initial Impression / Assessment and Plan / ED Course  I have reviewed the triage vital signs and the nursing notes.  Pertinent labs & imaging results that were available during my care of the patient were reviewed by me and considered in my medical decision making (see chart for details).     Barbara Dennis is an 85 year old female with history of hypertension, neck mass who presents to the ED following fall.  Patient with normal vitals.  No fever.  Patient with fall yesterday but had a hematoma to the back of her head and was sent for evaluation.  Patient is alert and oriented.  She is able to move all extremities.  Neurologically she is intact.  She has been able to ambulate without any issues.  Exam is unremarkable.  CT scan shows no acute findings.  Patient does have some interval enlargement of her left parotid and right neck masses.  These are known since 2015.  According to radiology likely benign.  Has had previous FNA in the past.  Recommend follow-up with primary care doctor but no acute issues at this time.  Discharged from ED in good condition.  Understands return precautions.  This chart was dictated using voice recognition software.  Despite best efforts to proofread,  errors can occur which can change the documentation meaning.  Final Clinical Impressions(s) / ED Diagnoses   Final diagnoses:  Fall, initial encounter    ED Discharge Orders    None       Lennice Sites, DO 08/18/18 2048

## 2018-08-19 DIAGNOSIS — S2241XD Multiple fractures of ribs, right side, subsequent encounter for fracture with routine healing: Secondary | ICD-10-CM | POA: Diagnosis not present

## 2018-08-19 DIAGNOSIS — S22089D Unspecified fracture of T11-T12 vertebra, subsequent encounter for fracture with routine healing: Secondary | ICD-10-CM | POA: Diagnosis not present

## 2018-08-19 DIAGNOSIS — M6281 Muscle weakness (generalized): Secondary | ICD-10-CM | POA: Diagnosis not present

## 2018-08-19 DIAGNOSIS — R2681 Unsteadiness on feet: Secondary | ICD-10-CM | POA: Diagnosis not present

## 2018-08-19 DIAGNOSIS — Z9181 History of falling: Secondary | ICD-10-CM | POA: Diagnosis not present

## 2018-08-20 ENCOUNTER — Encounter (HOSPITAL_COMMUNITY): Payer: Self-pay | Admitting: *Deleted

## 2018-08-20 DIAGNOSIS — Z9181 History of falling: Secondary | ICD-10-CM | POA: Diagnosis not present

## 2018-08-20 DIAGNOSIS — S22089D Unspecified fracture of T11-T12 vertebra, subsequent encounter for fracture with routine healing: Secondary | ICD-10-CM | POA: Diagnosis not present

## 2018-08-20 DIAGNOSIS — R2681 Unsteadiness on feet: Secondary | ICD-10-CM | POA: Diagnosis not present

## 2018-08-20 DIAGNOSIS — M6281 Muscle weakness (generalized): Secondary | ICD-10-CM | POA: Diagnosis not present

## 2018-08-20 DIAGNOSIS — S2241XD Multiple fractures of ribs, right side, subsequent encounter for fracture with routine healing: Secondary | ICD-10-CM | POA: Diagnosis not present

## 2018-08-20 NOTE — Progress Notes (Addendum)
Preop instructions for:  Annalynne Ibanez                       Date of Birth: June 30, 1934                             Date of Procedure: 08/24/2018       Doctor:Dr Beavers  Time to arrive at [place] Report to: Admitting  Procedure: Esophagogastroduodenoscopy  Any procedure time changes, MD office will notify you!   Do not eat or drink past midnight the night before your procedure.(To include any tube feedings-must be discontinued) Reminder:Follow bowel prep instructions per MD office!   Take these morning medications only with sips of water.(or give through gastrostomy or feeding tube). Inhalers as usual and bring     Facility contact:  [place]                  Phone: [place] POA:  Transportation contact phone#: [place]  Please send day of procedure:current med list and meds last taken that day, confirm nothing by mouth status from what time, Patient Demographic info( to include DNR status, problem list, allergies)   RN contact name/phone#:  Health and safety inspector                             and Fax [phone]  [employer] card and picture ID Leave all jewelry and other valuables at place where living( no metal or rings to be worn) No contact lens Women-no make-up, no lotions,perfumes,powders  Any questions day of procedure,call Endoscopy [phone]!   Sent from :[facility]                   [place]                   Fax:[phone]  Sent by :Gillian Shields RN

## 2018-08-21 DIAGNOSIS — R2681 Unsteadiness on feet: Secondary | ICD-10-CM | POA: Diagnosis not present

## 2018-08-21 DIAGNOSIS — S2241XD Multiple fractures of ribs, right side, subsequent encounter for fracture with routine healing: Secondary | ICD-10-CM | POA: Diagnosis not present

## 2018-08-21 DIAGNOSIS — Z9181 History of falling: Secondary | ICD-10-CM | POA: Diagnosis not present

## 2018-08-24 ENCOUNTER — Encounter (HOSPITAL_COMMUNITY): Payer: Self-pay | Admitting: *Deleted

## 2018-08-24 ENCOUNTER — Ambulatory Visit (HOSPITAL_COMMUNITY)
Admission: RE | Admit: 2018-08-24 | Discharge: 2018-08-24 | Disposition: A | Discharge status: Home / Self Care | Payer: Medicare Other | Source: Ambulatory Visit | Attending: Gastroenterology | Admitting: Gastroenterology

## 2018-08-24 ENCOUNTER — Ambulatory Visit (HOSPITAL_COMMUNITY): Payer: Medicare Other | Admitting: Certified Registered Nurse Anesthetist

## 2018-08-24 ENCOUNTER — Encounter (HOSPITAL_COMMUNITY)
Admission: RE | Disposition: A | Discharge status: Home / Self Care | Payer: Self-pay | Source: Ambulatory Visit | Attending: Gastroenterology

## 2018-08-24 ENCOUNTER — Other Ambulatory Visit: Payer: Self-pay | Admitting: Gastroenterology

## 2018-08-24 DIAGNOSIS — I10 Essential (primary) hypertension: Secondary | ICD-10-CM | POA: Diagnosis not present

## 2018-08-24 DIAGNOSIS — K2970 Gastritis, unspecified, without bleeding: Secondary | ICD-10-CM | POA: Diagnosis not present

## 2018-08-24 DIAGNOSIS — K2990 Gastroduodenitis, unspecified, without bleeding: Secondary | ICD-10-CM

## 2018-08-24 DIAGNOSIS — Z79899 Other long term (current) drug therapy: Secondary | ICD-10-CM | POA: Diagnosis not present

## 2018-08-24 DIAGNOSIS — B9681 Helicobacter pylori [H. pylori] as the cause of diseases classified elsewhere: Secondary | ICD-10-CM | POA: Diagnosis not present

## 2018-08-24 DIAGNOSIS — F17210 Nicotine dependence, cigarettes, uncomplicated: Secondary | ICD-10-CM | POA: Diagnosis not present

## 2018-08-24 DIAGNOSIS — R1013 Epigastric pain: Secondary | ICD-10-CM | POA: Diagnosis not present

## 2018-08-24 DIAGNOSIS — K2950 Unspecified chronic gastritis without bleeding: Secondary | ICD-10-CM | POA: Insufficient documentation

## 2018-08-24 DIAGNOSIS — R1084 Generalized abdominal pain: Secondary | ICD-10-CM

## 2018-08-24 DIAGNOSIS — K219 Gastro-esophageal reflux disease without esophagitis: Secondary | ICD-10-CM | POA: Insufficient documentation

## 2018-08-24 DIAGNOSIS — J449 Chronic obstructive pulmonary disease, unspecified: Secondary | ICD-10-CM | POA: Insufficient documentation

## 2018-08-24 DIAGNOSIS — R112 Nausea with vomiting, unspecified: Secondary | ICD-10-CM

## 2018-08-24 DIAGNOSIS — R110 Nausea: Secondary | ICD-10-CM | POA: Diagnosis not present

## 2018-08-24 DIAGNOSIS — K449 Diaphragmatic hernia without obstruction or gangrene: Secondary | ICD-10-CM

## 2018-08-24 HISTORY — PX: BIOPSY: SHX5522

## 2018-08-24 HISTORY — PX: ESOPHAGOGASTRODUODENOSCOPY (EGD) WITH PROPOFOL: SHX5813

## 2018-08-24 SURGERY — ESOPHAGOGASTRODUODENOSCOPY (EGD) WITH PROPOFOL
Anesthesia: Monitor Anesthesia Care

## 2018-08-24 MED ORDER — SODIUM CHLORIDE 0.9 % IV SOLN: INTRAVENOUS | Status: DC

## 2018-08-24 MED ORDER — PROPOFOL 10 MG/ML IV BOLUS
INTRAVENOUS | Status: AC
  Filled 2018-08-24: qty 60

## 2018-08-24 MED ORDER — LACTATED RINGERS IV SOLN
INTRAVENOUS | Status: DC | PRN
  Administered 2018-08-24: 11:00:00 via INTRAVENOUS

## 2018-08-24 MED ORDER — LIDOCAINE 2% (20 MG/ML) 5 ML SYRINGE
INTRAMUSCULAR | Status: DC | PRN
  Administered 2018-08-24: 40 mg via INTRAVENOUS

## 2018-08-24 MED ORDER — PROPOFOL 10 MG/ML IV BOLUS
INTRAVENOUS | Status: DC | PRN
  Administered 2018-08-24 (×2): 10 mg via INTRAVENOUS
  Administered 2018-08-24: 20 mg via INTRAVENOUS
  Administered 2018-08-24 (×3): 10 mg via INTRAVENOUS

## 2018-08-24 MED ORDER — OMEPRAZOLE 20 MG PO CPDR: 40.0000 mg | DELAYED_RELEASE_CAPSULE | Freq: Two times a day (BID) | ORAL | 3 refills | Status: DC

## 2018-08-24 MED ORDER — PROPOFOL 500 MG/50ML IV EMUL
INTRAVENOUS | Status: DC | PRN
  Administered 2018-08-24: 75 ug/kg/min via INTRAVENOUS

## 2018-08-24 SURGICAL SUPPLY — 14 items

## 2018-08-24 NOTE — Discharge Instructions (Signed)
YOU HAD AN ENDOSCOPIC PROCEDURE TODAY: Refer to the procedure report and other information in the discharge instructions given to you for any specific questions about what was found during the examination. If this information does not answer your questions, please call [facility] office at [phone] to clarify.  ° °YOU SHOULD EXPECT: Some feelings of bloating in the abdomen. Passage of more gas than usual. Walking can help get rid of the air that was put into your GI tract during the procedure and reduce the bloating. If you had a lower endoscopy (such as a colonoscopy or flexible sigmoidoscopy) you may notice spotting of blood in your stool or on the toilet paper. Some abdominal soreness may be present for a day or two, also. ° °DIET: Your first meal following the procedure should be a light meal and then it is ok to progress to your normal diet. A half-sandwich or bowl of soup is an example of a good first meal. Heavy or fried foods are harder to digest and may make you feel nauseous or bloated. Drink plenty of fluids but you should avoid alcoholic beverages for 24 hours. If you had a esophageal dilation, please see attached instructions for diet.   ° °ACTIVITY: Your care partner should take you home directly after the procedure. You should plan to take it easy, moving slowly for the rest of the day. You can resume normal activity the day after the procedure however YOU SHOULD NOT DRIVE, use power tools, machinery or perform tasks that involve climbing or major physical exertion for 24 hours (because of the sedation medicines used during the test).  ° °SYMPTOMS TO REPORT IMMEDIATELY: °A gastroenterologist can be reached at any hour. Please call [phone]  for any of the following symptoms:  °Following lower endoscopy (colonoscopy, flexible sigmoidoscopy) °Excessive amounts of blood in the stool  °Significant tenderness, worsening of abdominal pains  °Swelling of the abdomen that is new, acute  °Fever of 100° or  higher  °Following upper endoscopy (EGD, EUS, ERCP, esophageal dilation) °Vomiting of blood or coffee ground material  °New, significant abdominal pain  °New, significant chest pain or pain under the shoulder blades  °Painful or persistently difficult swallowing  °New shortness of breath  °Black, tarry-looking or red, bloody stools ° °FOLLOW UP:  °If any biopsies were taken you will be contacted by phone or by letter within the next 1-3 weeks. Call [phone]  if you have not heard about the biopsies in 3 weeks.  °Please also call with any specific questions about appointments or follow up tests. ° °

## 2018-08-24 NOTE — Anesthesia Postprocedure Evaluation (Signed)
Anesthesia Post Note  Patient: Barbara Dennis  Procedure(s) Performed: ESOPHAGOGASTRODUODENOSCOPY (EGD) WITH PROPOFOL (N/A )     Patient location during evaluation: PACU Anesthesia Type: MAC Level of consciousness: awake and alert Pain management: pain level controlled Vital Signs Assessment: post-procedure vital signs reviewed and stable Respiratory status: spontaneous breathing, nonlabored ventilation and respiratory function stable Cardiovascular status: stable and blood pressure returned to baseline Anesthetic complications: no    Last Vitals:  Vitals:   08/24/18 1130 08/24/18 1140  BP: (!) 170/98 (!) 153/63  Pulse: 83 80  Resp: (!) 21 16  Temp:    SpO2: 100% 100%    Last Pain:  Vitals:   08/24/18 1140  TempSrc:   PainSc: 0-No pain                 Audry Pili

## 2018-08-24 NOTE — Transfer of Care (Signed)
Immediate Anesthesia Transfer of Care Note  Patient: Barbara Dennis  Procedure(s) Performed: ESOPHAGOGASTRODUODENOSCOPY (EGD) WITH PROPOFOL (N/A )  Patient Location: Endoscopy Unit  Anesthesia Type:MAC  Level of Consciousness: drowsy  Airway & Oxygen Therapy: Patient Spontanous Breathing and Patient connected to nasal cannula oxygen  Post-op Assessment: Report given to RN and Post -op Vital signs reviewed and stable  Post vital signs: Reviewed and stable  Last Vitals:  Vitals Value Taken Time  BP 119/50 08/24/2018 11:13 AM  Temp    Pulse 74 08/24/2018 11:14 AM  Resp 20 08/24/2018 11:14 AM  SpO2 100 % 08/24/2018 11:14 AM  Vitals shown include unvalidated device data.  Last Pain:  Vitals:   08/24/18 1113  TempSrc:   PainSc: 0-No pain         Complications: No apparent anesthesia complications

## 2018-08-24 NOTE — Op Note (Signed)
[facility] Patient Name: Barbara Dennis Procedure Date: 08/24/2018 MRN: [id number] Attending MD: Thornton Park MD, MD Date of Birth: 1934-02-08 CSN: [id number] Age: 84 Admit Type: Outpatient Procedure:                Upper GI endoscopy Indications:              Epigastric abdominal pain, Nausea Providers:                Thornton Park MD, MD, Elmer Ramp. Tilden Dome, RN,                            William Dalton, Technician Referring MD:              Medicines:                See the Anesthesia note for documentation of the                            administered medications Complications:            No immediate complications. Estimated Blood Loss:     Estimated blood loss was minimal. Procedure:                Pre-Anesthesia Assessment:                           - Prior to the procedure, a History and Physical                            was performed, and patient medications and                            allergies were reviewed. The patient's tolerance of                            previous anesthesia was also reviewed. The risks                            and benefits of the procedure and the sedation                            options and risks were discussed with the patient.                            All questions were answered, and informed consent                            was obtained. Prior Anticoagulants: The patient has                            taken Lovenox (enoxaparin), last dose was 1 day                            prior to procedure. ASA Grade Assessment: III - A  patient with severe systemic disease. After                            reviewing the risks and benefits, the patient was                            deemed in satisfactory condition to undergo the                            procedure.                           After obtaining informed consent, the endoscope was                            passed under direct vision.  Throughout the                            procedure, the patient's blood pressure, pulse, and                            oxygen saturations were monitored continuously. The                            GIF-H190 ([id number] Olympus adult endoscope was                            introduced through the mouth, and advanced to the                            third part of duodenum. The upper GI endoscopy was                            accomplished without difficulty. The patient                            tolerated the procedure well. Scope In: Scope Out: Findings:      The esophagus was normal. Mildly tortuous distal esophagus.      Diffuse severe inflammation characterized by congestion (edema),       erythema and granularity was found in the gastric body. The upper to       mid-portion of the stomach is involved. The gastric mucosa has a       geographic appearance. Multiple biopsies were taken with a cold forceps       for histology. Estimated blood loss was minimal.      The examined duodenum was normal. Impression:               - Normal esophagus.                           - Gastritis. Biopsied.                           - Normal examined duodenum. Moderate Sedation:      moderate sedation not used during this procedure. Monitored  anesthesia       care was provided. Recommendation:           - Discharge patient to home.                           - Resume previous diet.                           - Continue present medications although I increased                            the omeprazole to 40 mg twice daily for 2 months.                           - Resume Lovenox today. May resume warfarin as                            instructed by your prescribing physician.                           - Pathology results.                           - Return to my office to see me or Alonza Bogus in                            4 weeks. Procedure Code(s):        --- Professional ---                            [phone], Esophagogastroduodenoscopy, flexible,                            transoral; with biopsy, single or multiple Diagnosis Code(s):        --- Professional ---                           K29.70, Gastritis, unspecified, without bleeding                           R10.13, Epigastric pain                           R11.0, Nausea CPT copyright 2018 [employer]. All rights reserved. The codes documented in this report are preliminary and upon coder review may  be revised to meet current compliance requirements. Thornton Park MD, MD 08/24/2018 11:20:04 AM This report has been signed electronically. Number of Addenda: 0

## 2018-08-24 NOTE — Interval H&P Note (Signed)
History and Physical Interval Note:  08/24/2018 10:02 AM  Barbara Dennis  has presented today for surgery, with the diagnosis of nausea, epi pain. The various methods of treatment have been discussed with the patient and family. After consideration of risks, benefits and other options for treatment, the patient has consented to  Procedure(s): ESOPHAGOGASTRODUODENOSCOPY (EGD) WITH PROPOFOL (N/A) as a surgical intervention .  The patient's history has been reviewed, patient examined, no change in status, stable for surgery.  I have reviewed the patient's chart and labs.  Questions were answered to the patient's satisfaction.     Thornton Park

## 2018-08-24 NOTE — Anesthesia Preprocedure Evaluation (Addendum)
Anesthesia Evaluation  Patient identified by MRN, date of birth, ID band Patient awake    Reviewed: Allergy & Precautions, NPO status , Patient's Chart, lab work & pertinent test results  History of Anesthesia Complications Negative for: history of anesthetic complications  Airway Mallampati: II  TM Distance: >3 FB Neck ROM: Full    Dental  (+) Dental Advisory Given, Edentulous Upper   Pulmonary COPD, Current Smoker,    breath sounds clear to auscultation       Cardiovascular hypertension, Pt. on medications  Rhythm:Regular Rate:Normal     Neuro/Psych PSYCHIATRIC DISORDERS Anxiety Depression negative neurological ROS     GI/Hepatic Neg liver ROS, GERD  Medicated and Controlled,  Endo/Other  negative endocrine ROS  Renal/GU Renal disease     Musculoskeletal  (+) Arthritis ,  Chronic back and leg pain    Abdominal   Peds  Hematology negative hematology ROS (+)   Anesthesia Other Findings   Reproductive/Obstetrics                            Anesthesia Physical Anesthesia Plan  ASA: III  Anesthesia Plan: MAC   Post-op Pain Management:    Induction: Intravenous  PONV Risk Score and Plan: 2 and Propofol infusion and Treatment may vary due to age or medical condition  Airway Management Planned: Nasal Cannula and Natural Airway  Additional Equipment: None  Intra-op Plan:   Post-operative Plan:   Informed Consent: I have reviewed the patients History and Physical, chart, labs and discussed the procedure including the risks, benefits and alternatives for the proposed anesthesia with the patient or authorized representative who has indicated his/her understanding and acceptance.     Plan Discussed with: CRNA and Anesthesiologist  Anesthesia Plan Comments:        Anesthesia Quick Evaluation

## 2018-08-26 ENCOUNTER — Encounter (HOSPITAL_COMMUNITY): Payer: Self-pay | Admitting: Gastroenterology

## 2018-08-27 ENCOUNTER — Non-Acute Institutional Stay (SKILLED_NURSING_FACILITY): Payer: Medicare Other | Admitting: Internal Medicine

## 2018-08-27 ENCOUNTER — Encounter: Payer: Self-pay | Admitting: Internal Medicine

## 2018-08-27 DIAGNOSIS — K2900 Acute gastritis without bleeding: Secondary | ICD-10-CM | POA: Diagnosis not present

## 2018-08-27 DIAGNOSIS — N3281 Overactive bladder: Secondary | ICD-10-CM | POA: Diagnosis not present

## 2018-08-27 DIAGNOSIS — I10 Essential (primary) hypertension: Secondary | ICD-10-CM

## 2018-08-27 NOTE — Progress Notes (Signed)
Location:  [facility] Room Number: 119J Place of Service:  SNF (31)  Barbara Dennis. Barbara Coil, MD  Patient Care Team: Horald Pollen, MD as PCP - General (Internal Medicine) Melida Quitter, MD as Consulting Physician (Otolaryngology)  Extended Emergency Contact Information Primary Emergency Contact: Leandra Kern of [place] Phone: [phone] Relation: Niece Secondary Emergency Contact: Chung,Won  [place] Mobile Phone: [phone] Relation: None    Allergies: Patient has no known allergies.  Chief Complaint  Patient presents with  . Medical Management of Chronic Issues    Routine Visit    HPI: Patient is 84 y.o. female who is being seen for routine issues of gastritis, new diagnosis, hypertension, and OAB.  Past Medical History:  Diagnosis Date  . Hypertension   . Mass of right side of neck   . Oxygen dependent     Past Surgical History:  Procedure Laterality Date  . APPENDECTOMY    . BIOPSY  08/24/2018   Procedure: BIOPSY;  Surgeon: Thornton Park, MD;  Location: [facility];  Service: Gastroenterology;;  . ESOPHAGOGASTRODUODENOSCOPY (EGD) WITH PROPOFOL N/A 08/24/2018   Procedure: ESOPHAGOGASTRODUODENOSCOPY (EGD) WITH PROPOFOL;  Surgeon: Thornton Park, MD;  Location: [facility];  Service: Gastroenterology;  Laterality: N/A;    Allergies as of 08/27/2018   No Known Allergies     Medication List        Accurate as of 08/27/18 11:59 PM. Always use your most recent med list.          acetaminophen 500 MG tablet Commonly known as:  TYLENOL Take 2 tablets (1,000 mg total) by mouth every 8 (eight) hours.   albuterol 108 (90 Base) MCG/ACT inhaler Commonly known as:  PROVENTIL HFA;VENTOLIN HFA Inhale 2 puffs into the lungs every 4 (four) hours as needed for wheezing or shortness of breath (cough, shortness of breath or wheezing.).   bisacodyl 10 MG  suppository Commonly known as:  DULCOLAX Place 10 mg rectally as needed for moderate constipation.   diclofenac sodium 1 % Gel Commonly known as:  VOLTAREN Apply 2 g topically 4 (four) times daily.   docusate sodium 100 MG capsule Commonly known as:  COLACE Take 1 capsule (100 mg total) by mouth 2 (two) times daily.   enoxaparin 30 MG/0.3ML injection Commonly known as:  LOVENOX Inject 0.3 mLs (30 mg total) into the skin daily.   feeding supplement (ENSURE ENLIVE) Liqd Take 237 mLs by mouth 2 (two) times daily between meals.   lisinopril 20 MG tablet Commonly known as:  PRINIVIL,ZESTRIL Take 20 mg by mouth daily.   magnesium hydroxide 400 MG/5ML suspension Commonly known as:  MILK OF MAGNESIA Take 30 mLs by mouth daily as needed for moderate constipation.   multivitamin with minerals Tabs tablet Take 1 tablet by mouth daily.   MYRBETRIQ 25 MG Tb24 tablet Generic drug:  mirabegron ER Take 25 mg by mouth daily.   nicotine 21 mg/24hr patch Commonly known as:  NICODERM CQ - dosed in mg/24 hours Place 21 mg onto the skin. Apply 1 patch daily after Removing previous x 6 weeks For Smoking Ceastion   omeprazole 20 MG capsule Commonly known as:  PRILOSEC TAKE 2 CAPSULES BY MOUTH TWICE DAILY BEFORE A MEAL   ondansetron 4 MG disintegrating tablet Commonly known as:  ZOFRAN-ODT Take 1 tablet (4 mg total) by mouth every 6 (six) hours as needed for nausea.  PEPTO-BISMOL PO Take 1 capsule by mouth daily as needed (constipation).   polyethylene glycol powder powder Commonly known as:  GLYCOLAX/MIRALAX Take 17 g by mouth 2 (two) times daily as needed.   RA SALINE ENEMA RE Place 1 Dose rectally daily as needed (constipation).   TUMS ULTRA 1000 PO Take 1,000 mg by mouth 2 (two) times daily.       No orders of the defined types were placed in this encounter.   Immunization History  Administered Date(s) Administered  . Influenza,inj,Quad PF,6+ Mos 10/19/2015, 07/03/2018   . Pneumococcal Conjugate-13 07/11/2014    Social History   Tobacco Use  . Smoking status: Current Every Day Smoker    Packs/day: 0.70    Years: 60.00    Pack years: 42.00    Types: Cigarettes  . Smokeless tobacco: Never Used  Substance Use Topics  . Alcohol use: No    Alcohol/week: 0.0 standard drinks    Review of Systems  DATA OBTAINED: from nurse church member who is serving as patient's family GENERAL:  no fevers, fatigue, appetite changes SKIN: No itching, rash HEENT: No complaint RESPIRATORY: No cough, wheezing, SOB CARDIAC: No chest pain, palpitations, lower extremity edema  GI: No abdominal pain, No N/V/D or constipation, No heartburn or reflux  GU: No dysuria, frequency or urgency, or incontinence  MUSCULOSKELETAL: No unrelieved bone/joint pain NEUROLOGIC: No headache, dizziness  PSYCHIATRIC: No overt anxiety or sadness  Vitals:   08/27/18 1237  BP: (!) 141/77  Pulse: 95  Resp: 20  Temp: (!) 96.7 F (35.9 C)   Body mass index is 17.66 kg/m. Physical Exam  GENERAL APPEARANCE: Alert, conversant, No acute distress  SKIN: No diaphoresis rash HEENT: Unremarkable RESPIRATORY: Breathing is even, unlabored. Lung sounds are clear   CARDIOVASCULAR: Heart RRR no murmurs, rubs or gallops. No peripheral edema  GASTROINTESTINAL: Abdomen is soft, epigastric to left side tender, not distended w/ normal bowel sounds.  GENITOURINARY: Bladder non tender, not distended  MUSCULOSKELETAL: No abnormal joints or musculature NEUROLOGIC: Cranial nerves 2-12 grossly intact. Moves all extremities PSYCHIATRIC: Mood and affect appropriate to situation, no behavioral issues  Patient Active Problem List   Diagnosis Date Noted  . Gastritis 08/29/2018  . Overactive bladder 08/29/2018  . Gastritis and gastroduodenitis   . Epigastric pain 08/11/2018  . O2 dependent 08/11/2018  . T12 compression fracture ([place]) 08/02/2018  . Acute kidney injury ([place]) 08/02/2018  . Hyponatremia  08/02/2018  . Multiple rib fractures 07/19/2018  . Nausea and vomiting 07/16/2018  . Gastroesophageal reflux disease 07/16/2018  . Generalized abdominal pain 05/01/2018  . Chronic pain syndrome 01/07/2018  . Chronic obstructive pulmonary disease ([place]) 08/20/2017  . Multinodular goiter 08/15/2017  . Musculoskeletal pain 07/15/2017  . Bilateral leg pain 07/15/2017  . Chronic bilateral low back pain without sciatica 07/15/2017  . Osteopenia of multiple sites 07/15/2017  . Essential hypertension, benign 07/15/2017  . Hypercalcemia 07/15/2017  . Depression with anxiety 01/06/2015  . 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 HTN, goal below 150/90 06/17/2013  . DJD (degenerative joint disease) 02/16/2013  . Tobacco user 09/23/2012  . Menopause 08/21/2012  . Postmenopausal atrophic vaginitis 08/21/2012    CMP     Component Value Date/Time   NA 123 (A) 07/28/2018   K 4.7 07/28/2018   CL 101 07/23/2018 0358   CO2 25 07/23/2018 0358   GLUCOSE 102 (H) 07/23/2018 0358   BUN 15 07/28/2018   CREATININE 0.8 07/28/2018   CREATININE 0.93 07/23/2018 0358  CREATININE 0.98 (H) 04/02/2016 0903   CALCIUM 8.5 (L) 07/23/2018 0358   PROT 7.7 07/19/2018 1629   PROT 7.9 03/31/2018 1643   ALBUMIN 3.6 07/19/2018 1629   ALBUMIN 4.4 03/31/2018 1643   AST 21 07/19/2018 1629   ALT 17 07/19/2018 1629   ALKPHOS 110 07/19/2018 1629   BILITOT 1.1 07/19/2018 1629   BILITOT 0.4 03/31/2018 1643   GFRNONAA 56 (L) 07/23/2018 0358   GFRAA >60 07/23/2018 0358   Recent Labs    07/21/18 0622 07/22/18 0429 07/23/18 0358 07/28/18  NA 128* 129* 131* 123*  K 4.4 4.2 4.4 4.7  CL 97* 104 101  --   CO2 23 23 25   --   GLUCOSE 127* 114* 102*  --   BUN 27* 20 26* 15  CREATININE 1.27* 0.97 0.93 0.8  CALCIUM 8.8* 8.1* 8.5*  --    Recent Labs    03/13/18 1119 03/31/18 1643 07/19/18 1629  AST 16 16 21   ALT 15 13 17   ALKPHOS 61 70 110  BILITOT 0.3 0.4 1.1  PROT 7.5 7.9 7.7  ALBUMIN 4.4 4.4 3.6    Recent Labs    03/13/18 1119  07/19/18 1629 07/20/18 0734 07/23/18 0358 07/28/18  WBC 7.1   < > 14.1* 10.6* 7.8 7.5  NEUTROABS 4.4  --  11.9*  --   --   --   HGB 11.1   < > 12.0 11.1* 9.4* 10.5*  HCT 34.0   < > 35.6* 33.2* 28.3* 31*  MCV 97   < > 94.4 94.9 95.3  --   PLT 264   < > 217 208 239 365   < > = values in this interval not displayed.   No results for input(s): CHOL, LDLCALC, TRIG in the last 8760 hours.  Invalid input(s): HCL No results found for: [facility] Lab Results  Component Value Date   TSH 1.02 03/03/2018   No results found for: HGBA1C Lab Results  Component Value Date   CHOL 234 (H) 01/21/2017   HDL 57 01/21/2017   LDLCALC 141 (H) 01/21/2017   TRIG 179 (H) 01/21/2017   CHOLHDL 4.1 01/21/2017    Significant Diagnostic Results in last 30 days:  Ct Head Wo Contrast  Result Date: 08/18/2018 CLINICAL DATA:  Fall last night. Posterior scalp hematoma. Initial encounter. EXAM: CT HEAD WITHOUT CONTRAST CT CERVICAL SPINE WITHOUT CONTRAST TECHNIQUE: Multidetector CT imaging of the head and cervical spine was performed following the standard protocol without intravenous contrast. Multiplanar CT image reconstructions of the cervical spine were also generated. COMPARISON:  Cervical spine radiographs 09/24/2017. Soft tissue neck CT 12/14/2013. FINDINGS: CT HEAD FINDINGS Brain: Lacunar infarcts in the left greater than right basal ganglia are likely chronic in this setting. No acute large territory infarct, intracranial hemorrhage, mass, or midline shift is identified. Asymmetric extra-axial CSF along the falx in the posterior left frontal region measures 9 mm in left-to-right thickness with mild local mass effect on the adjacent superior frontal gyrus. Scattered cerebral white matter hypodensities are nonspecific but compatible with mild chronic small vessel ischemic disease. There is mild cerebral atrophy. Vascular: Calcified atherosclerosis at the skull base. Skull: No  fracture or suspicious osseous lesion. Sinuses/Orbits: Visualized paranasal sinuses and mastoid air cells are clear. The right frontal sinus is hypoplastic. Bilateral cataract extraction is noted. Other: Mild posterior scalp soft tissue swelling in the midline. CT CERVICAL SPINE FINDINGS Alignment: Chronic grade 1 retrolisthesis of C5 on C6, degenerative. Skull base and vertebrae: No acute  fracture or destructive osseous process. Soft tissues and spinal canal: No prevertebral fluid or swelling. No visible canal hematoma. Disc levels: Chronic severe C5-6 disc degeneration with complete disc space height loss and prominent degenerative endplate sclerosis and spurring. At least moderate spinal stenosis at C5-6 due to disc bulging and retrolisthesis. Chronic, partially calcified central C3-4 disc extrusion resulting in mild-to-moderate spinal stenosis. Multilevel facet arthrosis, moderate to severe on the left at C4-5 with moderate left neural foraminal stenosis. Upper chest: Unchanged 2 mm right upper lobe lung nodule. Aortic arch atherosclerosis. Other: Moderately severe calcified atherosclerosis at the carotid bifurcations. Interval enlargement of deep lobe left parotid mass, now 2.2 x 1.8 cm. Interval enlargement of right neck mass located posterior to the submandibular gland and deep to the parotid tail and sternocleidomastoid muscle, now 3.3 x 2.1 cm. IMPRESSION: 1. No acute intracranial hemorrhage. 2. Asymmetric extra-axial CSF along the falx in the left frontal region, possibly a small arachnoid cyst or subdural hygroma. 3. Mild chronic small vessel ischemic disease. Bilateral basal ganglia lacunar infarcts, likely chronic. 4. Mild posterior scalp soft tissue swelling. 5. No acute fracture or traumatic subluxation identified in the cervical spine. 6. Interval enlargement of left parotid and right neck masses. Slow growth from 2015 favors a benign etiology such as Warthin's tumors, however correlate with previous  FNA results. Electronically Signed   By: Logan Bores M.D.   On: 08/18/2018 19:51   Ct Cervical Spine Wo Contrast  Result Date: 08/18/2018 CLINICAL DATA:  Fall last night. Posterior scalp hematoma. Initial encounter. EXAM: CT HEAD WITHOUT CONTRAST CT CERVICAL SPINE WITHOUT CONTRAST TECHNIQUE: Multidetector CT imaging of the head and cervical spine was performed following the standard protocol without intravenous contrast. Multiplanar CT image reconstructions of the cervical spine were also generated. COMPARISON:  Cervical spine radiographs 09/24/2017. Soft tissue neck CT 12/14/2013. FINDINGS: CT HEAD FINDINGS Brain: Lacunar infarcts in the left greater than right basal ganglia are likely chronic in this setting. No acute large territory infarct, intracranial hemorrhage, mass, or midline shift is identified. Asymmetric extra-axial CSF along the falx in the posterior left frontal region measures 9 mm in left-to-right thickness with mild local mass effect on the adjacent superior frontal gyrus. Scattered cerebral white matter hypodensities are nonspecific but compatible with mild chronic small vessel ischemic disease. There is mild cerebral atrophy. Vascular: Calcified atherosclerosis at the skull base. Skull: No fracture or suspicious osseous lesion. Sinuses/Orbits: Visualized paranasal sinuses and mastoid air cells are clear. The right frontal sinus is hypoplastic. Bilateral cataract extraction is noted. Other: Mild posterior scalp soft tissue swelling in the midline. CT CERVICAL SPINE FINDINGS Alignment: Chronic grade 1 retrolisthesis of C5 on C6, degenerative. Skull base and vertebrae: No acute fracture or destructive osseous process. Soft tissues and spinal canal: No prevertebral fluid or swelling. No visible canal hematoma. Disc levels: Chronic severe C5-6 disc degeneration with complete disc space height loss and prominent degenerative endplate sclerosis and spurring. At least moderate spinal stenosis at  C5-6 due to disc bulging and retrolisthesis. Chronic, partially calcified central C3-4 disc extrusion resulting in mild-to-moderate spinal stenosis. Multilevel facet arthrosis, moderate to severe on the left at C4-5 with moderate left neural foraminal stenosis. Upper chest: Unchanged 2 mm right upper lobe lung nodule. Aortic arch atherosclerosis. Other: Moderately severe calcified atherosclerosis at the carotid bifurcations. Interval enlargement of deep lobe left parotid mass, now 2.2 x 1.8 cm. Interval enlargement of right neck mass located posterior to the submandibular gland and deep to the parotid  tail and sternocleidomastoid muscle, now 3.3 x 2.1 cm. IMPRESSION: 1. No acute intracranial hemorrhage. 2. Asymmetric extra-axial CSF along the falx in the left frontal region, possibly a small arachnoid cyst or subdural hygroma. 3. Mild chronic small vessel ischemic disease. Bilateral basal ganglia lacunar infarcts, likely chronic. 4. Mild posterior scalp soft tissue swelling. 5. No acute fracture or traumatic subluxation identified in the cervical spine. 6. Interval enlargement of left parotid and right neck masses. Slow growth from 2015 favors a benign etiology such as Warthin's tumors, however correlate with previous FNA results. Electronically Signed   By: Logan Bores M.D.   On: 08/18/2018 19:51    Assessment and Plan  Gastritis New diagnosis, diagnosed recently on 11/4 but upper GI done at [facility]; omeprazole was increased from 40 mg daily to 40 mg twice daily for the next 2 months  Essential hypertension, benign Controlled; continue lisinopril 20 mg daily  Overactive bladder No complaints; continue Myrbetriq 24 hours 25 mg daily    Breeana Sawtelle D. Barbara Coil, MD

## 2018-08-28 ENCOUNTER — Telehealth: Payer: Self-pay | Admitting: Gastroenterology

## 2018-08-28 NOTE — Telephone Encounter (Signed)
Called niece back twice, no answer and unable to lvm. Patient resides at [facility], will contact them and fax results/orders to their facility.

## 2018-08-29 ENCOUNTER — Encounter: Payer: Self-pay | Admitting: Internal Medicine

## 2018-08-29 DIAGNOSIS — N3281 Overactive bladder: Secondary | ICD-10-CM | POA: Insufficient documentation

## 2018-08-29 DIAGNOSIS — K2970 Gastritis, unspecified, without bleeding: Secondary | ICD-10-CM | POA: Insufficient documentation

## 2018-08-29 NOTE — Assessment & Plan Note (Signed)
Controlled  - continue lisinopril 20 mg daily

## 2018-08-29 NOTE — Assessment & Plan Note (Signed)
New diagnosis, diagnosed recently on 11/4 but upper GI done at [facility]; omeprazole was increased from 40 mg daily to 40 mg twice daily for the next 2 months

## 2018-08-29 NOTE — Assessment & Plan Note (Signed)
No complaints; continue Myrbetriq 24 hours 25 mg daily

## 2018-09-03 ENCOUNTER — Encounter: Payer: Self-pay | Admitting: Endocrinology

## 2018-09-03 ENCOUNTER — Ambulatory Visit (INDEPENDENT_AMBULATORY_CARE_PROVIDER_SITE_OTHER): Payer: Medicare Other | Admitting: Endocrinology

## 2018-09-03 VITALS — BP 126/72 | HR 98 | Ht 60.0 in | Wt 107.2 lb

## 2018-09-03 DIAGNOSIS — S22080D Wedge compression fracture of T11-T12 vertebra, subsequent encounter for fracture with routine healing: Secondary | ICD-10-CM

## 2018-09-03 DIAGNOSIS — E042 Nontoxic multinodular goiter: Secondary | ICD-10-CM | POA: Diagnosis not present

## 2018-09-03 DIAGNOSIS — E0590 Thyrotoxicosis, unspecified without thyrotoxic crisis or storm: Secondary | ICD-10-CM | POA: Diagnosis not present

## 2018-09-03 LAB — T4, FREE: Free T4: 1.42 ng/dL (ref 0.60–1.60)

## 2018-09-03 LAB — TSH: TSH: 0.82 u[IU]/mL (ref 0.35–4.50)

## 2018-09-03 LAB — VITAMIN D 25 HYDROXY (VIT D DEFICIENCY, FRACTURES): VITD: 43.12 ng/mL (ref 30.00–100.00)

## 2018-09-03 NOTE — Patient Instructions (Addendum)
it is critically important to prevent falling down (keep floor areas well-lit, dry, and free of loose objects.  If you have a cane, walker, or wheelchair, you should use it, even for short trips around the house.  Wear flat-soled shoes.  Also, try not to rush).     blood tests are requested for you today.  We'll let you know about the results.   most of the time, a "lumpy thyroid" will eventually become overactive.  this is usually a slow process, happening over the span of many years.   Therefore, you should have your thyroid blood tests 1-2 times per year.   Let's check the bone density test.  If it is low, you should have a once-a-year infusion, done here at the office.  Please come back for a follow-up appointment in 1 year.   Please verify with the nursing facility that they are giving you the antibiotic for the stomach infection.     ???? ?? ???? ?? ?? ????? (??? ?? ???? ??? ????? ??? ??????. ???, ??? ?? ?????? ?? ? ??? ?? ?? ? ??? ???????. ??? ?? ???? ????). ?? ??? ?? ?????. ??? ?? ?? ??????. ???? ??, "??? ???"? ?? ?? ???????. ??? ????? ?? ???? ??? ?? ?????. ??? ??? ?? ??? 1 ?? 1-2 ? ???????. ??? ??? ???. ?? ?? 1 ?? ? ? ???????. 6 ?? ?? ?? ??? ?? ?? ????. ?? ???? ? ??? ?? ???? ???? ??? ??????.

## 2018-09-03 NOTE — Progress Notes (Signed)
Subjective:    Patient ID: Barbara Dennis, female    DOB: 1934-08-19, 84 y.o.   MRN: [id number]  HPI Pt returns for f/u of hypercalcemia (dx'ed 2018 (it was normal in 2017); she has h/o low bone density on plain films of the lower back; she has no h/o urolithiasis or bony fracture; she prefers to receive results via letter).  pt states she feels well in general.   She also has h/o suppressed TSH in 2018 (was normal on recheck; plan is to follow; [place] in 2018 showed 4 nodules, none of which meets criteria for biopsy or dedicated follow-up).   Past Medical History:  Diagnosis Date  . Hypertension   . Mass of right side of neck   . Oxygen dependent     Past Surgical History:  Procedure Laterality Date  . APPENDECTOMY    . BIOPSY  08/24/2018   Procedure: BIOPSY;  Surgeon: Thornton Park, MD;  Location: [facility];  Service: Gastroenterology;;  . ESOPHAGOGASTRODUODENOSCOPY (EGD) WITH PROPOFOL N/A 08/24/2018   Procedure: ESOPHAGOGASTRODUODENOSCOPY (EGD) WITH PROPOFOL;  Surgeon: Thornton Park, MD;  Location: [facility];  Service: Gastroenterology;  Laterality: N/A;    Social History   Socioeconomic History  . Marital status: Widowed    Spouse name: Not on file  . Number of children: Not on file  . Years of education: Not on file  . Highest education level: Not on file  Occupational History  . Not on file  Social Needs  . Financial resource strain: Not on file  . Food insecurity:    Worry: Not on file    Inability: Not on file  . Transportation needs:    Medical: Not on file    Non-medical: Not on file  Tobacco Use  . Smoking status: Current Every Day Smoker    Packs/day: 0.70    Years: 60.00    Pack years: 42.00    Types: Cigarettes  . Smokeless tobacco: Never Used  Substance and Sexual Activity  . Alcohol use: No    Alcohol/week: 0.0 standard drinks  . Drug use: No  . Sexual activity: Not on file  Lifestyle  . Physical activity:    Days per week: Not on file      Minutes per session: Not on file  . Stress: Not on file  Relationships  . Social connections:    Talks on phone: Not on file    Gets together: Not on file    Attends religious service: Not on file    Active member of club or organization: Not on file    Attends meetings of clubs or organizations: Not on file    Relationship status: Not on file  . Intimate partner violence:    Fear of current or ex partner: Not on file    Emotionally abused: Not on file    Physically abused: Not on file    Forced sexual activity: Not on file  Other Topics Concern  . Not on file  Social History Narrative   Marital status: widowed 28 years ago.  From [place]; moved to [place] 1974.     Children: none       Lives:  Alone; brother in law is Psychologist, sport and exercise who is 17.      Employment: retired.       Tobacco:  1/2 ppd       Alcohol:  None      Exercise:  Walking daily.      ADLs: no driving.  Current Outpatient Medications on File Prior to Visit  Medication Sig Dispense Refill  . acetaminophen (TYLENOL) 500 MG tablet Take 2 tablets (1,000 mg total) by mouth every 8 (eight) hours. 30 tablet 0  . albuterol (PROVENTIL HFA;VENTOLIN HFA) 108 (90 Base) MCG/ACT inhaler Inhale 2 puffs into the lungs every 4 (four) hours as needed for wheezing or shortness of breath (cough, shortness of breath or wheezing.). 1 Inhaler 1  . bisacodyl (DULCOLAX) 10 MG suppository Place 10 mg rectally as needed for moderate constipation.    . Bismuth Subsalicylate (PEPTO-BISMOL PO) Take 1 capsule by mouth daily as needed (constipation).    . Calcium Carbonate Antacid (TUMS ULTRA 1000 PO) Take 1,000 mg by mouth 2 (two) times daily.    . diclofenac sodium (VOLTAREN) 1 % GEL Apply 2 g topically 4 (four) times daily. 100 g 3  . docusate sodium (COLACE) 100 MG capsule Take 1 capsule (100 mg total) by mouth 2 (two) times daily. 10 capsule 0  . enoxaparin (LOVENOX) 30 MG/0.3ML injection Inject 0.3 mLs (30 mg total) into the skin daily. 0 Syringe    . feeding supplement, ENSURE ENLIVE, (ENSURE ENLIVE) LIQD Take 237 mLs by mouth 2 (two) times daily between meals. 237 mL 12  . lisinopril (PRINIVIL,ZESTRIL) 20 MG tablet Take 20 mg by mouth daily.    . magnesium hydroxide (MILK OF MAGNESIA) 400 MG/5ML suspension Take 30 mLs by mouth daily as needed for moderate constipation.    . mirabegron ER (MYRBETRIQ) 25 MG TB24 tablet Take 25 mg by mouth daily.    . Multiple Vitamin (MULTIVITAMIN WITH MINERALS) TABS tablet Take 1 tablet by mouth daily.    . nicotine (NICODERM CQ - DOSED IN MG/24 HOURS) 21 mg/24hr patch Place 21 mg onto the skin. Apply 1 patch daily after Removing previous x 6 weeks For Smoking Ceastion    . omeprazole (PRILOSEC) 20 MG capsule TAKE 2 CAPSULES BY MOUTH TWICE DAILY BEFORE A MEAL 385 capsule 3  . ondansetron (ZOFRAN-ODT) 4 MG disintegrating tablet Take 1 tablet (4 mg total) by mouth every 6 (six) hours as needed for nausea. 20 tablet 0  . polyethylene glycol powder (GLYCOLAX/MIRALAX) powder Take 17 g by mouth 2 (two) times daily as needed. (Patient taking differently: Take 17 g by mouth 2 (two) times daily as needed for moderate constipation. ) 289 g 1  . Sodium Phosphates (RA SALINE ENEMA RE) Place 1 Dose rectally daily as needed (constipation).     No current facility-administered medications on file prior to visit.     No Known Allergies  Family History  Problem Relation Age of Onset  . Hypercalcemia Neg Hx     BP 126/72 (BP Location: Left Arm, Patient Position: Sitting, Cuff Size: Normal)   Pulse 98   Ht 5' (1.524 m)   Wt 107 lb 3.2 oz (48.6 kg)   SpO2 95%   BMI 20.94 kg/m    Review of Systems Denies falls.      Objective:   Physical Exam VITAL SIGNS:  See vs page GENERAL: no distress NECK: thyroid is slightly enlarged, with irreg surface.  No palpable nodule.   Gait: steady with a cane      Assessment & Plan:  Hypercalcemia: recheck today Hyperthyroidism: recheck today  Patient Instructions  it  is critically important to prevent falling down (keep floor areas well-lit, dry, and free of loose objects.  If you have a cane, walker, or wheelchair, you should use it, even for short  trips around the house.  Wear flat-soled shoes.  Also, try not to rush).     blood tests are requested for you today.  We'll let you know about the results.   most of the time, a "lumpy thyroid" will eventually become overactive.  this is usually a slow process, happening over the span of many years.   Therefore, you should have your thyroid blood tests 1-2 times per year.   Let's check the bone density test.  If it is low, you should have a once-a-year infusion, done here at the office.  Please come back for a follow-up appointment in 1 year.   Please verify with the nursing facility that they are giving you the antibiotic for the stomach infection.     ???? ?? ???? ?? ?? ????? (??? ?? ???? ??? ????? ??? ??????. ???, ??? ?? ?????? ?? ? ??? ?? ?? ? ??? ???????. ??? ?? ???? ????). ?? ??? ?? ?????. ??? ?? ?? ??????. ???? ??, "??? ???"? ?? ?? ???????. ??? ????? ?? ???? ??? ?? ?????. ??? ??? ?? ??? 1 ?? 1-2 ? ???????. ??? ??? ???. ?? ?? 1 ?? ? ? ???????. 6 ?? ?? ?? ??? ?? ?? ????. ?? ???? ? ??? ?? ???? ???? ??? ??????.

## 2018-09-04 ENCOUNTER — Telehealth: Payer: Self-pay

## 2018-09-04 LAB — PTH, INTACT AND CALCIUM
Calcium: 9.4 mg/dL (ref 8.6–10.4)
PTH: 27 pg/mL (ref 14–64)

## 2018-09-04 NOTE — Telephone Encounter (Signed)
Letter mailed to inform pt of lab results.

## 2018-09-07 ENCOUNTER — Encounter: Payer: Self-pay | Admitting: Endocrinology

## 2018-09-07 ENCOUNTER — Ambulatory Visit (INDEPENDENT_AMBULATORY_CARE_PROVIDER_SITE_OTHER)
Admission: RE | Admit: 2018-09-07 | Discharge: 2018-09-07 | Disposition: A | Discharge status: Home / Self Care | Payer: Medicare Other | Source: Ambulatory Visit | Attending: Endocrinology | Admitting: Endocrinology

## 2018-09-07 DIAGNOSIS — S22080D Wedge compression fracture of T11-T12 vertebra, subsequent encounter for fracture with routine healing: Secondary | ICD-10-CM

## 2018-09-07 DIAGNOSIS — M810 Age-related osteoporosis without current pathological fracture: Secondary | ICD-10-CM | POA: Diagnosis not present

## 2018-09-08 ENCOUNTER — Other Ambulatory Visit: Payer: Self-pay | Admitting: Endocrinology

## 2018-09-08 ENCOUNTER — Encounter: Payer: Self-pay | Admitting: Endocrinology

## 2018-09-08 MED ORDER — IBANDRONATE SODIUM 150 MG PO TABS: 150.0000 mg | ORAL_TABLET | ORAL | 3 refills | Status: DC

## 2018-09-09 ENCOUNTER — Ambulatory Visit (INDEPENDENT_AMBULATORY_CARE_PROVIDER_SITE_OTHER): Payer: Medicare Other | Admitting: Endocrinology

## 2018-09-09 ENCOUNTER — Encounter: Payer: Self-pay | Admitting: Endocrinology

## 2018-09-09 DIAGNOSIS — M810 Age-related osteoporosis without current pathological fracture: Secondary | ICD-10-CM | POA: Diagnosis not present

## 2018-09-09 DIAGNOSIS — R110 Nausea: Secondary | ICD-10-CM

## 2018-09-09 NOTE — Patient Instructions (Addendum)
It is critically important to prevent falling down (keep floor areas well-lit, dry, and free of loose objects.  If you have a cane, walker, or wheelchair, you should use it, even for short trips around the house.  Wear flat-soled shoes.  Also, try not to rush) Take calcium 1200 mg per day, and vitamin-D, 400 units per day You should take "Reclast" (once a year infusion, given here at the office), for the osteoporosis. Please come back for a follow-up appointment in 6 months     Osteoporosis Osteoporosis is the thinning and loss of density in the bones. Osteoporosis makes the bones more brittle, fragile, and likely to break (fracture). Over time, osteoporosis can cause the bones to become so weak that they fracture after a simple fall. The bones most likely to fracture are the bones in the hip, wrist, and spine. What are the causes? The exact cause is not known. What increases the risk? Anyone can develop osteoporosis. You may be at greater risk if you have a family history of the condition or have poor nutrition. You may also have a higher risk if you are:  Female.  72 years old or older.  A smoker.  Not physically active.  White or Asian.  Slender.  What are the signs or symptoms? A fracture might be the first sign of the disease, especially if it results from a fall or injury that would not usually cause a bone to break. Other signs and symptoms include:  Low back and neck pain.  Stooped posture.  Height loss.  How is this diagnosed? To make a diagnosis, your health care provider may:  Take a medical history.  Perform a physical exam.  Order tests, such as: ? A bone mineral density test. ? A dual-energy X-ray absorptiometry test.  How is this treated? The goal of osteoporosis treatment is to strengthen your bones to reduce your risk of a fracture. Treatment may involve:  Making lifestyle changes, such as: ? Eating a diet rich in calcium. ? Doing weight-bearing  and muscle-strengthening exercises. ? Stopping tobacco use. ? Limiting alcohol intake.  Taking medicine to slow the process of bone loss or to increase bone density.  Monitoring your levels of calcium and vitamin D.  Follow these instructions at home:  Include calcium and vitamin D in your diet. Calcium is important for bone health, and vitamin D helps the body absorb calcium.  Perform weight-bearing and muscle-strengthening exercises as directed by your health care provider.  Do not use any tobacco products, including cigarettes, chewing tobacco, and electronic cigarettes. If you need help quitting, ask your health care provider.  Limit your alcohol intake.  Take medicines only as directed by your health care provider.  Keep all follow-up visits as directed by your health care provider. This is important.  Take precautions at home to lower your risk of falling, such as: ? Keeping rooms well lit and clutter free. ? Installing safety rails on stairs. ? Using rubber mats in the bathroom and other areas that are often wet or slippery. Get help right away if: You fall or injure yourself. This information is not intended to replace advice given to you by your health care provider. Make sure you discuss any questions you have with your health care provider. Document Released: 07/17/2005 Document Revised: 03/11/2016 Document Reviewed: 03/17/2014 Elsevier Interactive Patient Education  2018 [employer].   ???? ?? ???? ?? ?? ????? (??? ?? ???? ??? ????? ??? ??????. ???, ??? ?? ?????? ?? ? ??? ?? ?? ? ??? ???????. ??? ?? ???? ????) ???  1200mg ? ??? ??? 400 ??? ??? -D? ?????? ??? ????? ?? "Reclast"(1 ?? ? ?, ?? ????? ??)? ???????. 6 ?? ?? ?? ??? ?? ?? ???? ???? ????? ?? ??? ???? ???? ????. ????? ?? ? ???? ?? ???? ?? ? ?? ? ???? ???? (??). ??? ??? ?? ????? ?? ?? ???? ??? ?? ?? ?? ? ? ????. ?? ???? ?? ?? ?? ???, ?? ? ??? ????. ??? ?????? ??? ??? ??? ?? ????. ??? ??? ?? ????? ??? ????? ??? ?  ????. ?? ??? ??? ?? ??? ?? ?? ?? ? ? ??? ?? ? ????. ??? ?? ?? ??? ? ??? ? ????. ??. 50 ? ?? ???. ?? ??? ????. ?? ?? ??? ?. ???. ??? ????? ??? ? ????. ?? ??? ??? ?? ??? ?? ?? ?? ? ? ??? ?? ? ????. ??? ?? ?? ??? ? ??? ? ????. ??. 50 ? ?? ???. ?? ??? ????. ?? ?? ??? ?. ???. ?? ? ??? ?????? ??? ?? ?? ???? ?? ???? ???? ?? ??? ? ?? ? ? ????. ?? ??? ??? ??? ????. ??? ?? ??. ?? ??. ?? ??. ??? ??? ?????? ??? ??? ?? ?? ?? ???? ??? ?? ? ? ????. ??? ???. ?? ??? ??????. ??? ?? ?? ??? : ??? ??. ?? ??? X- ? ?? ??? ???. ??? ??? ?????? ???? ??? ??? ?? ??? ??? ?? ?? ????? ????. ???? ??? ??? ? ????. ??? ?? ?? ?? ?? : ??? ????? ??. ?? ?? ? ?? ?? ??. ?? ??? ?????. ??? ?? ??. ? ?? ??? ???? ? ??? ??? ?? ?? ??????. ?? ? ??? D ?? ???? ??? ?? ??? ?????. ??? ??? ??? D? ?? ?????. ??? ? ??? ???? ??? D? ??? ??? ????? ?????. ?? ?? ???? ??? ?? ?? ?? ? ?? ?? ??? ??????. ??, ?? ?? ? ?? ??? ??? ?? ??? ???? ????. ??? ??? ????, ?? ?? ??? ????? ??????. ??? ??? ??????. ?? ???? ?? ? ??? ?? ??????. ?? ??? ???? ??? ?? ?? ?? ??? ??????. ??? ?????. ??? ?? ?? ? ??? ??? ?? ??? ?? ??? ?????. ?? ?? ???? ??????. ??? ?? ?? ??. ??? ??? ???? ??? ?? ?? ??.

## 2018-09-09 NOTE — Progress Notes (Signed)
Subjective:    Patient ID: [name], female    DOB: [birth year], 84 y.o.   MRN: [id number]  HPI Pt returns for f/u of osteoporosis.  Pt was noted to have osteoporosis in 2019.  She has never been on medication for this.  Only bony fractures were ribs (2019), with a fall.  She has no history of any of the following: early menopause, cancer, renal dz, prolonged bedrest, steroids, alcoholism, liver dz.  She is a smoker.  She does not take anticonvulsants.  She has had limited mobility, but she walks in rehab now.  She has slight myalgias of the legs, and assoc weakness.  She takes lovenox.  Nausea persists.   Past Medical History:  Diagnosis Date  . Hypertension   . Mass of right side of neck   . Oxygen dependent     Past Surgical History:  Procedure Laterality Date  . APPENDECTOMY    . BIOPSY  08/24/2018   Procedure: BIOPSY;  Surgeon: Thornton Park, MD;  Location: [facility];  Service: Gastroenterology;;  . ESOPHAGOGASTRODUODENOSCOPY (EGD) WITH PROPOFOL N/A 08/24/2018   Procedure: ESOPHAGOGASTRODUODENOSCOPY (EGD) WITH PROPOFOL;  Surgeon: Thornton Park, MD;  Location: [facility];  Service: Gastroenterology;  Laterality: N/A;    Social History   Socioeconomic History  . Marital status: Widowed    Spouse name: Not on file  . Number of children: Not on file  . Years of education: Not on file  . Highest education level: Not on file  Occupational History  . Not on file  Social Needs  . Financial resource strain: Not on file  . Food insecurity:    Worry: Not on file    Inability: Not on file  . Transportation needs:    Medical: Not on file    Non-medical: Not on file  Tobacco Use  . Smoking status: Current Every Day Smoker    Packs/day: 0.70    Years: 60.00    Pack years: 42.00    Types: Cigarettes  . Smokeless tobacco: Never Used  Substance and Sexual Activity  . Alcohol use: No    Alcohol/week: 0.0 standard drinks  . Drug use: No  . Sexual activity: Not on  file  Lifestyle  . Physical activity:    Days per week: Not on file    Minutes per session: Not on file  . Stress: Not on file  Relationships  . Social connections:    Talks on phone: Not on file    Gets together: Not on file    Attends religious service: Not on file    Active member of club or organization: Not on file    Attends meetings of clubs or organizations: Not on file    Relationship status: Not on file  . Intimate partner violence:    Fear of current or ex partner: Not on file    Emotionally abused: Not on file    Physically abused: Not on file    Forced sexual activity: Not on file  Other Topics Concern  . Not on file  Social History Narrative   Marital status: widowed 28 years ago.  From [place]; moved to [place] 1974.     Children: none       Lives:  Alone; brother in law is Psychologist, sport and exercise who is 35.      Employment: retired.       Tobacco:  1/2 ppd       Alcohol:  None      Exercise:  Walking daily.      ADLs: no driving.    Current Outpatient Medications on File Prior to Visit  Medication Sig Dispense Refill  . acetaminophen (TYLENOL) 500 MG tablet Take 2 tablets (1,000 mg total) by mouth every 8 (eight) hours. 30 tablet 0  . albuterol (PROVENTIL HFA;VENTOLIN HFA) 108 (90 Base) MCG/ACT inhaler Inhale 2 puffs into the lungs every 4 (four) hours as needed for wheezing or shortness of breath (cough, shortness of breath or wheezing.). 1 Inhaler 1  . bisacodyl (DULCOLAX) 10 MG suppository Place 10 mg rectally as needed for moderate constipation.    . Bismuth Subsalicylate (PEPTO-BISMOL PO) Take 1 capsule by mouth daily as needed (constipation).    . Calcium Carbonate Antacid (TUMS ULTRA 1000 PO) Take 1,000 mg by mouth 2 (two) times daily.    . diclofenac sodium (VOLTAREN) 1 % GEL Apply 2 g topically 4 (four) times daily. 100 g 3  . docusate sodium (COLACE) 100 MG capsule Take 1 capsule (100 mg total) by mouth 2 (two) times daily. 10 capsule 0  . enoxaparin (LOVENOX) 30 MG/0.3ML  injection Inject 0.3 mLs (30 mg total) into the skin daily. 0 Syringe   . feeding supplement, ENSURE ENLIVE, (ENSURE ENLIVE) LIQD Take 237 mLs by mouth 2 (two) times daily between meals. 237 mL 12  . lisinopril (PRINIVIL,ZESTRIL) 20 MG tablet Take 20 mg by mouth daily.    . magnesium hydroxide (MILK OF MAGNESIA) 400 MG/5ML suspension Take 30 mLs by mouth daily as needed for moderate constipation.    . mirabegron ER (MYRBETRIQ) 25 MG TB24 tablet Take 25 mg by mouth daily.    . Multiple Vitamin (MULTIVITAMIN WITH MINERALS) TABS tablet Take 1 tablet by mouth daily.    . nicotine (NICODERM CQ - DOSED IN MG/24 HOURS) 21 mg/24hr patch Place 21 mg onto the skin. Apply 1 patch daily after Removing previous x 6 weeks For Smoking Ceastion    . omeprazole (PRILOSEC) 20 MG capsule TAKE 2 CAPSULES BY MOUTH TWICE DAILY BEFORE A MEAL 385 capsule 3  . ondansetron (ZOFRAN-ODT) 4 MG disintegrating tablet Take 1 tablet (4 mg total) by mouth every 6 (six) hours as needed for nausea. 20 tablet 0  . polyethylene glycol powder (GLYCOLAX/MIRALAX) powder Take 17 g by mouth 2 (two) times daily as needed. (Patient taking differently: Take 17 g by mouth 2 (two) times daily as needed for moderate constipation. ) 289 g 1  . Sodium Phosphates (RA SALINE ENEMA RE) Place 1 Dose rectally daily as needed (constipation).     No current facility-administered medications on file prior to visit.     No Known Allergies  Family History  Problem Relation Age of Onset  . Hypercalcemia Neg Hx   . Osteoporosis Neg Hx     BP 132/70 (BP Location: Left Arm, Patient Position: Sitting)   Pulse 95   Ht 5' (1.524 m)   Wt 107 lb (48.5 kg)   SpO2 93%   BMI 20.90 kg/m    Review of Systems Denies LOC, but she has intermitt falls.      Objective:   Physical Exam VITAL SIGNS:  See vs page GENERAL: no distress.  In wheelchair.   Chest wall: no kyphosis   X-rays: no mention is made of thoracic fractures.     Assessment & Plan:    Osteoporosis, new.  She needs rx Nausea: this is a relative contraindication to oral bisphosphonate   Patient Instructions  It is critically important to  prevent falling down (keep floor areas well-lit, dry, and free of loose objects.  If you have a cane, walker, or wheelchair, you should use it, even for short trips around the house.  Wear flat-soled shoes.  Also, try not to rush) Take calcium 1200 mg per day, and vitamin-D, 400 units per day You should take "Reclast" (once a year infusion, given here at the office), for the osteoporosis. Please come back for a follow-up appointment in 6 months     Osteoporosis Osteoporosis is the thinning and loss of density in the bones. Osteoporosis makes the bones more brittle, fragile, and likely to break (fracture). Over time, osteoporosis can cause the bones to become so weak that they fracture after a simple fall. The bones most likely to fracture are the bones in the hip, wrist, and spine. What are the causes? The exact cause is not known. What increases the risk? Anyone can develop osteoporosis. You may be at greater risk if you have a family history of the condition or have poor nutrition. You may also have a higher risk if you are:  Female.  82 years old or older.  A smoker.  Not physically active.  White or Asian.  Slender.  What are the signs or symptoms? A fracture might be the first sign of the disease, especially if it results from a fall or injury that would not usually cause a bone to break. Other signs and symptoms include:  Low back and neck pain.  Stooped posture.  Height loss.  How is this diagnosed? To make a diagnosis, your health care provider may:  Take a medical history.  Perform a physical exam.  Order tests, such as: ? A bone mineral density test. ? A dual-energy X-ray absorptiometry test.  How is this treated? The goal of osteoporosis treatment is to strengthen your bones to reduce your risk of a  fracture. Treatment may involve:  Making lifestyle changes, such as: ? Eating a diet rich in calcium. ? Doing weight-bearing and muscle-strengthening exercises. ? Stopping tobacco use. ? Limiting alcohol intake.  Taking medicine to slow the process of bone loss or to increase bone density.  Monitoring your levels of calcium and vitamin D.  Follow these instructions at home:  Include calcium and vitamin D in your diet. Calcium is important for bone health, and vitamin D helps the body absorb calcium.  Perform weight-bearing and muscle-strengthening exercises as directed by your health care provider.  Do not use any tobacco products, including cigarettes, chewing tobacco, and electronic cigarettes. If you need help quitting, ask your health care provider.  Limit your alcohol intake.  Take medicines only as directed by your health care provider.  Keep all follow-up visits as directed by your health care provider. This is important.  Take precautions at home to lower your risk of falling, such as: ? Keeping rooms well lit and clutter free. ? Installing safety rails on stairs. ? Using rubber mats in the bathroom and other areas that are often wet or slippery. Get help right away if: You fall or injure yourself. This information is not intended to replace advice given to you by your health care provider. Make sure you discuss any questions you have with your health care provider. Document Released: 07/17/2005 Document Revised: 03/11/2016 Document Reviewed: 03/17/2014 Elsevier Interactive Patient Education  2018 [employer].   ???? ?? ???? ?? ?? ????? (??? ?? ???? ??? ????? ??? ??????. ???, ??? ?? ?????? ?? ? ??? ?? ?? ? ??? ???????. ??? ?? ???? ????) ???  1200mg ? ??? ??? 400 ??? ??? -D? ?????? ??? ????? ?? "Reclast"(1 ?? ? ?, ?? ????? ??)? ???????. 6 ?? ?? ?? ??? ?? ?? ???? ???? ????? ?? ??? ???? ???? ????. ????? ?? ? ???? ?? ???? ?? ? ?? ? ???? ???? (??). ??? ??? ?? ????? ?? ?? ????  ??? ?? ?? ?? ? ? ????. ?? ???? ?? ?? ?? ???, ?? ? ??? ????. ??? ?????? ??? ??? ??? ?? ????. ??? ??? ?? ????? ??? ????? ??? ? ????. ?? ??? ??? ?? ??? ?? ?? ?? ? ? ??? ?? ? ????. ??? ?? ?? ??? ? ??? ? ????. ??. 50 ? ?? ???. ?? ??? ????. ?? ?? ??? ?. ???. ??? ????? ??? ? ????. ?? ??? ??? ?? ??? ?? ?? ?? ? ? ??? ?? ? ????. ??? ?? ?? ??? ? ??? ? ????. ??. 50 ? ?? ???. ?? ??? ????. ?? ?? ??? ?. ???. ?? ? ??? ?????? ??? ?? ?? ???? ?? ???? ???? ?? ??? ? ?? ? ? ????. ?? ??? ??? ??? ????. ??? ?? ??. ?? ??. ?? ??. ??? ??? ?????? ??? ??? ?? ?? ?? ???? ??? ?? ? ? ????. ??? ???. ?? ??? ??????. ??? ?? ?? ??? : ??? ??. ?? ??? X- ? ?? ??? ???. ??? ??? ?????? ???? ??? ??? ?? ??? ??? ?? ?? ????? ????. ???? ??? ??? ? ????. ??? ?? ?? ?? ?? : ??? ????? ??. ?? ?? ? ?? ?? ??. ?? ??? ?????. ??? ?? ??. ? ?? ??? ???? ? ??? ??? ?? ?? ??????. ?? ? ??? D ?? ???? ??? ?? ??? ?????. ??? ??? ??? D? ?? ?????. ??? ? ??? ???? ??? D? ??? ??? ????? ?????. ?? ?? ???? ??? ?? ?? ?? ? ?? ?? ??? ??????. ??, ?? ?? ? ?? ??? ??? ?? ??? ???? ????. ??? ??? ????, ?? ?? ??? ????? ??????. ??? ??? ??????. ?? ???? ?? ? ??? ?? ??????. ?? ??? ???? ??? ?? ?? ?? ??? ??????. ??? ?????. ??? ?? ?? ? ??? ??? ?? ??? ?? ??? ?????. ?? ?? ???? ??????. ??? ?? ?? ??. ??? ??? ???? ??? ?? ?? ??.

## 2018-09-12 DIAGNOSIS — M810 Age-related osteoporosis without current pathological fracture: Secondary | ICD-10-CM | POA: Insufficient documentation

## 2018-09-16 NOTE — Progress Notes (Signed)
Location:  [facility] Service:  SNF [phone])  Provider: Hennie Duos MD  PCP: Horald Pollen, MD Patient Care Team: Horald Pollen, MD as PCP - General (Internal Medicine) Melida Quitter, MD as Consulting Physician (Otolaryngology)  Extended Emergency Contact Information Primary Emergency Contact: Leandra Kern of [place] Phone: [phone] Relation: Niece Secondary Emergency Contact: Chung,Won  [place] Mobile Phone: [phone] Relation: None  No Known Allergies  Chief Complaint  Patient presents with  . Discharge Note    HPI:  84 y.o. female with COPD, hypertension, GERD, who was seen in the ED after a fall the night prior.  She complained of right-sided chest pain.  X-ray showed fractures of the right third and fourth rib.  Patient also complained of mild right upper quadrant pain.  CT the abdomen showed a T12 compression fracture but no intra-abdominal injury.  Patient was admitted to [facility] from 9/29-10/3 where she was seen by neurosurgery.  They recommended pain control.  Therapy evaluate the patient and recommended SNF.  Patient was admitted to skilled nursing facility for OT/PT and is now ready to be discharged home.    Past Medical History:  Diagnosis Date  . Hypertension   . Mass of right side of neck   . Oxygen dependent     Past Surgical History:  Procedure Laterality Date  . APPENDECTOMY    . BIOPSY  08/24/2018   Procedure: BIOPSY;  Surgeon: Thornton Park, MD;  Location: [facility];  Service: Gastroenterology;;  . ESOPHAGOGASTRODUODENOSCOPY (EGD) WITH PROPOFOL N/A 08/24/2018   Procedure: ESOPHAGOGASTRODUODENOSCOPY (EGD) WITH PROPOFOL;  Surgeon: Thornton Park, MD;  Location: [facility];  Service: Gastroenterology;  Laterality: N/A;     reports that she has been smoking cigarettes. She has a 42.00 pack-year smoking history. She has never  used smokeless tobacco. She reports that she does not drink alcohol or use drugs. Social History   Socioeconomic History  . Marital status: Widowed    Spouse name: Not on file  . Number of children: Not on file  . Years of education: Not on file  . Highest education level: Not on file  Occupational History  . Not on file  Social Needs  . Financial resource strain: Not on file  . Food insecurity:    Worry: Not on file    Inability: Not on file  . Transportation needs:    Medical: Not on file    Non-medical: Not on file  Tobacco Use  . Smoking status: Current Every Day Smoker    Packs/day: 0.70    Years: 60.00    Pack years: 42.00    Types: Cigarettes  . Smokeless tobacco: Never Used  Substance and Sexual Activity  . Alcohol use: No    Alcohol/week: 0.0 standard drinks  . Drug use: No  . Sexual activity: Not on file  Lifestyle  . Physical activity:    Days per week: Not on file    Minutes per session: Not on file  . Stress: Not on file  Relationships  . Social connections:    Talks on phone: Not on file    Gets together: Not on file    Attends religious service: Not on file    Active member of club or organization: Not on file    Attends meetings of clubs or organizations: Not on file    Relationship  status: Not on file  . Intimate partner violence:    Fear of current or ex partner: Not on file    Emotionally abused: Not on file    Physically abused: Not on file    Forced sexual activity: Not on file  Other Topics Concern  . Not on file  Social History Narrative   Marital status: widowed 28 years ago.  From [place]; moved to [place] 1974.     Children: none       Lives:  Alone; brother in law is Psychologist, sport and exercise who is 37.      Employment: retired.       Tobacco:  1/2 ppd       Alcohol:  None      Exercise:  Walking daily.      ADLs: no driving.    Pertinent  Health Maintenance Due  Topic Date Due  . PNA vac Low Risk Adult (2 of 2 - PPSV23) 09/26/2018 (Originally  07/12/2015)  . INFLUENZA VACCINE  Completed  . DEXA SCAN  Completed    Medications: Allergies as of 09/18/2018   No Known Allergies     Medication List        Accurate as of 09/18/18 11:59 PM. Always use your most recent med list.          acetaminophen 500 MG tablet Commonly known as:  TYLENOL Take 2 tablets (1,000 mg total) by mouth every 8 (eight) hours.   albuterol 108 (90 Base) MCG/ACT inhaler Commonly known as:  PROVENTIL HFA;VENTOLIN HFA Inhale 2 puffs into the lungs every 4 (four) hours as needed for wheezing or shortness of breath.   bismuth subsalicylate 326 MG chewable tablet Commonly known as:  PEPTO BISMOL Chew 2 tablets (524 mg total) by mouth as needed.   calcium elemental as carbonate 400 MG chewable tablet Commonly known as:  BARIATRIC TUMS ULTRA Chew 3 tablets (1,200 mg total) by mouth 2 times daily at 12 noon and 4 pm.   diclofenac sodium 1 % Gel Commonly known as:  VOLTAREN Apply 2 g topically 4 (four) times daily.   docusate sodium 100 MG capsule Commonly known as:  COLACE Take 1 capsule (100 mg total) by mouth 2 (two) times daily.   lisinopril 20 MG tablet Commonly known as:  PRINIVIL,ZESTRIL Take 1 tablet (20 mg total) by mouth daily.   mirabegron ER 25 MG Tb24 tablet Commonly known as:  MYRBETRIQ Take 1 tablet (25 mg total) by mouth daily.   MOVE FREE JOINT HEALTH ADVANCE Tabs Take 1 tablet by mouth daily.   multivitamin with minerals Tabs tablet Take 1 tablet by mouth daily.   omeprazole 20 MG capsule Commonly known as:  PRILOSEC TAKE 2 CAPSULES BY MOUTH TWICE DAILY BEFORE A MEAL   ondansetron 4 MG disintegrating tablet Commonly known as:  ZOFRAN-ODT Take 1 tablet (4 mg total) by mouth every 6 (six) hours as needed for nausea.   polyethylene glycol powder powder Commonly known as:  GLYCOLAX/MIRALAX Take 17 g by mouth 2 (two) times daily as needed for moderate constipation.        Vitals:   09/18/18 1326  BP: 120/70    Pulse: 86  Resp: 18  Temp: (!) 97.1 F (36.2 C)  Height: 5\' 2"  (1.575 m)   Body mass index is 19.57 kg/m.  Physical Exam  GENERAL APPEARANCE: Alert, conversant. No acute distress.  HEENT: Unremarkable. RESPIRATORY: Breathing is even, unlabored. Lung sounds are clear   CARDIOVASCULAR: Heart RRR no  murmurs, rubs or gallops. No peripheral edema.  GASTROINTESTINAL: Abdomen is soft, non-tender, not distended w/ normal bowel sounds.  NEUROLOGIC: Cranial nerves 2-12 grossly intact. Moves all extremities   Labs reviewed: Basic Metabolic Panel: Recent Labs    07/21/18 0622 07/22/18 0429 07/23/18 0358 07/28/18 09/03/18 1158  NA 128* 129* 131* 123*  --   K 4.4 4.2 4.4 4.7  --   CL 97* 104 101  --   --   CO2 23 23 25   --   --   GLUCOSE 127* 114* 102*  --   --   BUN 27* 20 26* 15  --   CREATININE 1.27* 0.97 0.93 0.8  --   CALCIUM 8.8* 8.1* 8.5*  --  9.4   No results found for: [facility] Liver Function Tests: Recent Labs    03/13/18 1119 03/31/18 1643 07/19/18 1629  AST 16 16 21   ALT 15 13 17   ALKPHOS 61 70 110  BILITOT 0.3 0.4 1.1  PROT 7.5 7.9 7.7  ALBUMIN 4.4 4.4 3.6   Recent Labs    03/31/18 1643  LIPASE 64   No results for input(s): AMMONIA in the last 8760 hours. CBC: Recent Labs    03/13/18 1119  07/19/18 1629 07/20/18 0734 07/23/18 0358 07/28/18  WBC 7.1   < > 14.1* 10.6* 7.8 7.5  NEUTROABS 4.4  --  11.9*  --   --   --   HGB 11.1   < > 12.0 11.1* 9.4* 10.5*  HCT 34.0   < > 35.6* 33.2* 28.3* 31*  MCV 97   < > 94.4 94.9 95.3  --   PLT 264   < > 217 208 239 365   < > = values in this interval not displayed.   Lipid No results for input(s): CHOL, HDL, LDLCALC, TRIG in the last 8760 hours. Cardiac Enzymes: Recent Labs    07/19/18 1629  TROPONINI <0.03   BNP: No results for input(s): BNP in the last 8760 hours. CBG: Recent Labs    07/21/18 1641  GLUCAP 144*    Procedures and Imaging Studies During Stay: Dg Bone Density  Result Date:  09/07/2018 Date of study: 09/07/2018 Exam: DUAL X-RAY ABSORPTIOMETRY (DXA) FOR BONE MINERAL DENSITY (BMD) Instrument: [employer] Requesting Provider: Dr. Loanne Drilling Indication: Follow-up for osteoporosis Comparison: none (please note that it is not possible to compare data from different instruments) Clinical data: Pt is a 84 y.o. female with history of vertebral fracture and rib fractures.  On calcium and vitamin D. Results:  Lumbar spine L1-L4 Femoral neck (FN) 33% distal radius T-score -4.5 RFN: -3.9 LFN: -3.8 -6.0 Assessment: Patient has OSTEOPOROSIS according to the [facility] classification for osteoporosis (see below). Fracture risk: high Comments: the technical quality of the study is good Evaluation for secondary causes should be considered if clinically indicated. Recommend optimizing calcium (1200 mg/day) and vitamin D (800 IU/day). Treatment is indicated. Followup: Repeat BMD is appropriate after 1 to 2 years after starting treatment. WHO criteria for diagnosis of osteoporosis in postmenopausal women and in men 35 y/o or older: - normal: T-score -1.0 to + 1.0 - osteopenia/low bone density: T-score between -2.5 and -1.0 - osteoporosis: T-score below -2.5 - severe osteoporosis: T-score below -2.5 with history of fragility fracture Note: although not part of the WHO classification, the presence of a fragility fracture, regardless of the T-score, should be considered diagnostic of osteoporosis, provided other causes for the fracture have been excluded. Treatment: The [facility]  recommends that treatment be considered in postmenopausal women and men age 61 or older with: 1. Hip or vertebral (clinical or morphometric) fracture 2. T-score of - 2.5 or lower at the spine or hip 3. 10-year fracture probability by FRAX of at least 20% for a major osteoporotic fracture and 3% for a hip fracture Philemon Kingdom, MD [facility]    Assessment/Plan:   Closed fracture of multiple ribs  of right side with routine healing, subsequent encounter  Compression fracture of T12 vertebra with routine healing, subsequent encounter  Generalized abdominal pain - Plan: omeprazole (PRILOSEC) 20 MG capsule  Hyponatremia  Acute kidney injury (HCC)  HTN, goal below 150/90  Insomnia secondary to anxiety  Chronic pain of both knees  Gastroesophageal reflux disease without esophagitis  Tobacco user   Patient is being discharged with the following home health services: OT/PT  Patient is being discharged with the following durable medical equipment: Wheelchair  Patient has been advised to f/u with their PCP in 1-2 weeks to bring them up to date on their rehab stay.  Social services at facility was responsible for arranging this appointment.  Pt was provided with a 30 day supply of prescriptions for medications and refills must be obtained from their PCP.  For controlled substances, a more limited supply may be provided adequate until PCP appointment only.  Medications have been reconciled.  Spent greater than 30 minutes;> 50% of time with patient was spent reviewing records, labs, tests and studies, counseling and developing plan of care  Inocencio Homes, MD

## 2018-09-18 ENCOUNTER — Encounter: Payer: Self-pay | Admitting: Internal Medicine

## 2018-09-18 ENCOUNTER — Other Ambulatory Visit: Payer: Self-pay | Admitting: Internal Medicine

## 2018-09-18 ENCOUNTER — Non-Acute Institutional Stay (SKILLED_NURSING_FACILITY): Payer: Medicare Other | Admitting: Internal Medicine

## 2018-09-18 DIAGNOSIS — M25561 Pain in right knee: Secondary | ICD-10-CM

## 2018-09-18 DIAGNOSIS — S2241XD Multiple fractures of ribs, right side, subsequent encounter for fracture with routine healing: Secondary | ICD-10-CM | POA: Diagnosis not present

## 2018-09-18 DIAGNOSIS — N3281 Overactive bladder: Secondary | ICD-10-CM | POA: Diagnosis not present

## 2018-09-18 DIAGNOSIS — R1084 Generalized abdominal pain: Secondary | ICD-10-CM

## 2018-09-18 DIAGNOSIS — E871 Hypo-osmolality and hyponatremia: Secondary | ICD-10-CM | POA: Diagnosis not present

## 2018-09-18 DIAGNOSIS — G8929 Other chronic pain: Secondary | ICD-10-CM

## 2018-09-18 DIAGNOSIS — M810 Age-related osteoporosis without current pathological fracture: Secondary | ICD-10-CM | POA: Diagnosis not present

## 2018-09-18 DIAGNOSIS — S22080A Wedge compression fracture of T11-T12 vertebra, initial encounter for closed fracture: Secondary | ICD-10-CM | POA: Diagnosis not present

## 2018-09-18 DIAGNOSIS — I10 Essential (primary) hypertension: Secondary | ICD-10-CM

## 2018-09-18 DIAGNOSIS — K2970 Gastritis, unspecified, without bleeding: Secondary | ICD-10-CM | POA: Diagnosis not present

## 2018-09-18 DIAGNOSIS — Z720 Tobacco use: Secondary | ICD-10-CM

## 2018-09-18 DIAGNOSIS — M25562 Pain in left knee: Secondary | ICD-10-CM

## 2018-09-18 DIAGNOSIS — S22080D Wedge compression fracture of T11-T12 vertebra, subsequent encounter for fracture with routine healing: Secondary | ICD-10-CM | POA: Diagnosis not present

## 2018-09-18 DIAGNOSIS — N179 Acute kidney failure, unspecified: Secondary | ICD-10-CM

## 2018-09-18 DIAGNOSIS — K219 Gastro-esophageal reflux disease without esophagitis: Secondary | ICD-10-CM

## 2018-09-18 DIAGNOSIS — F5105 Insomnia due to other mental disorder: Secondary | ICD-10-CM

## 2018-09-18 DIAGNOSIS — F419 Anxiety disorder, unspecified: Secondary | ICD-10-CM

## 2018-09-18 MED ORDER — ONDANSETRON 4 MG PO TBDP: 4.0000 mg | ORAL_TABLET | Freq: Four times a day (QID) | ORAL | 0 refills | Status: DC | PRN

## 2018-09-18 MED ORDER — LISINOPRIL 20 MG PO TABS: 20.0000 mg | ORAL_TABLET | Freq: Every day | ORAL | 0 refills | Status: DC

## 2018-09-18 MED ORDER — CALCIUM CARBONATE ANTACID 1000 MG PO CHEW: 1000.0000 mg | CHEWABLE_TABLET | Freq: Two times a day (BID) | ORAL | 0 refills | Status: DC

## 2018-09-18 MED ORDER — DOCUSATE SODIUM 100 MG PO CAPS: 100.0000 mg | ORAL_CAPSULE | Freq: Two times a day (BID) | ORAL | 0 refills | Status: DC

## 2018-09-18 MED ORDER — ADULT MULTIVITAMIN W/MINERALS CH: 1.0000 | ORAL_TABLET | Freq: Every day | ORAL | 0 refills | Status: DC

## 2018-09-18 MED ORDER — MOVE FREE JOINT HEALTH ADVANCE PO TABS: 1.0000 | ORAL_TABLET | Freq: Every day | ORAL | 0 refills | Status: DC

## 2018-09-18 MED ORDER — OMEPRAZOLE 20 MG PO CPDR: DELAYED_RELEASE_CAPSULE | ORAL | 0 refills | Status: DC

## 2018-09-18 MED ORDER — POLYETHYLENE GLYCOL 3350 17 GM/SCOOP PO POWD: 17.0000 g | Freq: Two times a day (BID) | ORAL | 0 refills | Status: DC | PRN

## 2018-09-18 MED ORDER — DICLOFENAC SODIUM 1 % TD GEL: 2.0000 g | Freq: Four times a day (QID) | TRANSDERMAL | 0 refills | Status: DC

## 2018-09-18 MED ORDER — BISMUTH SUBSALICYLATE 262 MG PO CHEW: 524.0000 mg | CHEWABLE_TABLET | ORAL | 0 refills | Status: DC | PRN

## 2018-09-18 MED ORDER — ADULT MULTIVITAMIN W/MINERALS CH: 1.0000 | ORAL_TABLET | Freq: Every day | ORAL | 0 refills | Status: AC

## 2018-09-18 MED ORDER — ALBUTEROL SULFATE HFA 108 (90 BASE) MCG/ACT IN AERS: 2.0000 | INHALATION_SPRAY | RESPIRATORY_TRACT | 0 refills | Status: DC | PRN

## 2018-09-18 MED ORDER — MIRABEGRON ER 25 MG PO TB24: 25.0000 mg | ORAL_TABLET | Freq: Every day | ORAL | 0 refills | Status: DC

## 2018-09-21 DIAGNOSIS — G894 Chronic pain syndrome: Secondary | ICD-10-CM | POA: Diagnosis not present

## 2018-09-21 DIAGNOSIS — S22080A Wedge compression fracture of T11-T12 vertebra, initial encounter for closed fracture: Secondary | ICD-10-CM | POA: Diagnosis not present

## 2018-09-21 DIAGNOSIS — I10 Essential (primary) hypertension: Secondary | ICD-10-CM | POA: Diagnosis not present

## 2018-09-21 DIAGNOSIS — S2241XD Multiple fractures of ribs, right side, subsequent encounter for fracture with routine healing: Secondary | ICD-10-CM | POA: Diagnosis not present

## 2018-09-21 DIAGNOSIS — M810 Age-related osteoporosis without current pathological fracture: Secondary | ICD-10-CM | POA: Diagnosis not present

## 2018-09-21 DIAGNOSIS — Z720 Tobacco use: Secondary | ICD-10-CM | POA: Diagnosis not present

## 2018-09-21 DIAGNOSIS — Z9181 History of falling: Secondary | ICD-10-CM | POA: Diagnosis not present

## 2018-09-21 DIAGNOSIS — M1990 Unspecified osteoarthritis, unspecified site: Secondary | ICD-10-CM | POA: Diagnosis not present

## 2018-09-21 DIAGNOSIS — W19XXXD Unspecified fall, subsequent encounter: Secondary | ICD-10-CM | POA: Diagnosis not present

## 2018-09-21 DIAGNOSIS — J449 Chronic obstructive pulmonary disease, unspecified: Secondary | ICD-10-CM | POA: Diagnosis not present

## 2018-09-22 ENCOUNTER — Telehealth: Payer: Self-pay | Admitting: Nutrition

## 2018-09-22 ENCOUNTER — Telehealth: Payer: Self-pay | Admitting: Emergency Medicine

## 2018-09-22 ENCOUNTER — Ambulatory Visit: Payer: Medicare Other

## 2018-09-22 NOTE — Telephone Encounter (Signed)
please call patient's dtr: Please try the ibendronate (once a month pill).  Please let us know if this causes heartburn.  If so, we'll try to get PA for the reclast

## 2018-09-22 NOTE — Telephone Encounter (Signed)
Copied from [place] [id number]. Topic: Quick Communication - Home Health Verbal Orders >> Sep 22, 2018  4:30 PM Virl Axe D wrote: Caller/Agency: [facility] [place] Number: [phone] Requesting OT/PT/Skilled Nursing/Social Work: Home Health Physical Therapy Frequency: 2 for 4 1 for 2

## 2018-09-22 NOTE — Telephone Encounter (Signed)
Patient is here with her daughter and interpreter, for a reclast infusion.  The daughter says she got a letter that her insurance has denied the reclast infusion and wanted to know how much it would cost.  After telling her, she says that she has picked up a prescription for Ibandronate from the pharmacy, and wanted to know if her mother can take this in stead of the Reclast

## 2018-09-23 ENCOUNTER — Other Ambulatory Visit: Payer: Self-pay | Admitting: Emergency Medicine

## 2018-09-23 NOTE — Telephone Encounter (Signed)
Please advise 

## 2018-09-23 NOTE — Telephone Encounter (Signed)
Daughter was told this, and agreed to call us tomorrow with how she had tolerated this  medication.

## 2018-09-24 DIAGNOSIS — S2241XD Multiple fractures of ribs, right side, subsequent encounter for fracture with routine healing: Secondary | ICD-10-CM | POA: Diagnosis not present

## 2018-09-24 DIAGNOSIS — J449 Chronic obstructive pulmonary disease, unspecified: Secondary | ICD-10-CM | POA: Diagnosis not present

## 2018-09-24 DIAGNOSIS — Z9181 History of falling: Secondary | ICD-10-CM | POA: Diagnosis not present

## 2018-09-24 DIAGNOSIS — M1990 Unspecified osteoarthritis, unspecified site: Secondary | ICD-10-CM | POA: Diagnosis not present

## 2018-09-24 DIAGNOSIS — S22080A Wedge compression fracture of T11-T12 vertebra, initial encounter for closed fracture: Secondary | ICD-10-CM | POA: Diagnosis not present

## 2018-09-24 DIAGNOSIS — Z720 Tobacco use: Secondary | ICD-10-CM | POA: Diagnosis not present

## 2018-09-24 DIAGNOSIS — W19XXXD Unspecified fall, subsequent encounter: Secondary | ICD-10-CM | POA: Diagnosis not present

## 2018-09-24 DIAGNOSIS — I10 Essential (primary) hypertension: Secondary | ICD-10-CM | POA: Diagnosis not present

## 2018-09-24 DIAGNOSIS — M810 Age-related osteoporosis without current pathological fracture: Secondary | ICD-10-CM | POA: Diagnosis not present

## 2018-09-24 DIAGNOSIS — G894 Chronic pain syndrome: Secondary | ICD-10-CM | POA: Diagnosis not present

## 2018-09-24 NOTE — Telephone Encounter (Signed)
Verbal given 

## 2018-09-24 NOTE — Telephone Encounter (Signed)
Copied from [place] [id number]. Topic: Quick Communication - Home Health Verbal Orders >> Sep 24, 2018  1:30 PM Alanda Slim E wrote: Caller/Agency: [facility] Number: [phone] VM can be left  Requesting OT Frequency: 1x a week for 2 weeks  And 1x a week for 1 week

## 2018-09-28 ENCOUNTER — Other Ambulatory Visit: Payer: Self-pay

## 2018-09-28 ENCOUNTER — Ambulatory Visit: Payer: Medicare Other | Admitting: Emergency Medicine

## 2018-09-28 ENCOUNTER — Encounter: Payer: Self-pay | Admitting: Emergency Medicine

## 2018-09-28 VITALS — BP 129/69 | HR 88 | Temp 98.1°F | Resp 16 | Wt 88.8 lb

## 2018-09-28 DIAGNOSIS — N3281 Overactive bladder: Secondary | ICD-10-CM

## 2018-09-28 DIAGNOSIS — R350 Frequency of micturition: Secondary | ICD-10-CM | POA: Diagnosis not present

## 2018-09-28 LAB — POCT URINALYSIS DIP (MANUAL ENTRY)
Bilirubin, UA: NEGATIVE
Glucose, UA: NEGATIVE mg/dL
LEUKOCYTES UA: NEGATIVE
Nitrite, UA: NEGATIVE
Protein Ur, POC: NEGATIVE mg/dL
Spec Grav, UA: 1.015 (ref 1.010–1.025)
Urobilinogen, UA: 0.2 E.U./dL
pH, UA: 7 (ref 5.0–8.0)

## 2018-09-28 MED ORDER — CIPROFLOXACIN HCL 500 MG PO TABS: 500.0000 mg | ORAL_TABLET | Freq: Two times a day (BID) | ORAL | 0 refills | Status: DC

## 2018-09-28 NOTE — Patient Instructions (Addendum)
If you have lab work done today you will be contacted with your lab results within the next 2 weeks.  If you have not heard from [place] then please contact us. The fastest way to get your results is to register for My Chart.   IF you received an x-ray today, you will receive an invoice from [facility]. Please contact [facility] at [phone] with questions or concerns regarding your invoice.   IF you received labwork today, you will receive an invoice from [facility]. Please contact [facility] at [phone] with questions or concerns regarding your invoice.   Our billing staff will not be able to assist you with questions regarding bills from these companies.  You will be contacted with the lab results as soon as they are available. The fastest way to get your results is to activate your My Chart account. Instructions are located on the last page of this paperwork. If you have not heard from [place] regarding the results in 2 weeks, please contact this office.     Overactive Bladder, Adult Overactive bladder is a group of urinary symptoms. With overactive bladder, you may suddenly feel the need to pass urine (urinate) right away. After feeling this sudden urge, you might also leak urine if you cannot get to the bathroom fast enough (urinary incontinence). These symptoms might interfere with your daily work or social activities. Overactive bladder symptoms may also wake you up at night. Overactive bladder affects the nerve signals between your bladder and your brain. Your bladder may get the signal to empty before it is full. Very sensitive muscles can also make your bladder squeeze too soon. What are the causes? Many things can cause an overactive bladder. Possible causes include:  Urinary tract infection.  Infection of nearby tissues, such as the prostate.  Prostate enlargement.  Being pregnant with twins or more (multiples).  Surgery on the uterus or  urethra.  Bladder stones, inflammation, or tumors.  Drinking too much caffeine or alcohol.  Certain medicines, especially those that you take to help your body get rid of extra fluid (diuretics) by increasing urine production.  Muscle or nerve weakness, especially from: ? A spinal cord injury. ? Stroke. ? Multiple sclerosis. ? Parkinson disease.  Diabetes. This can cause a high urine volume that fills the bladder so quickly that the normal urge to urinate is triggered very strongly.  Constipation. A buildup of too much stool can put pressure on your bladder.  What increases the risk? You may be at greater risk for overactive bladder if you:  Are an older adult.  Smoke.  Are going through menopause.  Have prostate problems.  Have a neurological disease, such as stroke, dementia, Parkinson disease, or multiple sclerosis (MS).  Eat or drink things that irritate the bladder. These include alcohol, spicy food, and caffeine.  Are overweight or obese.  What are the signs or symptoms? The signs and symptoms of an overactive bladder include:  Sudden, strong urges to urinate.  Leaking urine.  Urinating eight or more times per day.  Waking up to urinate two or more times per night.  How is this diagnosed? Your health care provider may suspect overactive bladder based on your symptoms. The health care provider will do a physical exam and take your medical history. Blood or urine tests may also be done. For example, you might need to have a bladder function test to check how well you can hold your urine. You might also need  to see a health care provider who specializes in the urinary tract (urologist). How is this treated? Treatment for overactive bladder depends on the cause of your condition and whether it is mild or severe. Certain treatments can be done in your health care provider's office or clinic. You can also make lifestyle changes at home. Options include: Behavioral  Treatments  Biofeedback. A specialist uses sensors to help you become aware of your body's signals.  Keeping a daily log of when you need to urinate and what happens after the urge. This may help you manage your condition.  Bladder training. This helps you learn to control the urge to urinate by following a schedule that directs you to urinate at regular intervals (timed voiding). At first, you might have to wait a few minutes after feeling the urge. In time, you should be able to schedule bathroom visits an hour or more apart.  Kegel exercises. These are exercises to strengthen the pelvic floor muscles, which support the bladder. Toning these muscles can help you control urination, even if your bladder muscles are overactive. A specialist will teach you how to do these exercises correctly. They require daily practice.  Weight loss. If you are obese or overweight, losing weight might relieve your symptoms of overactive bladder. Talk to your health care provider about losing weight and whether there is a specific program or method that would work best for you.  Diet change. This might help if constipation is making your overactive bladder worse. Your health care provider or a dietitian can explain ways to change what you eat to ease constipation. You might also need to consume less alcohol and caffeine or drink other fluids at different times of the day.  Stopping smoking.  Wearing pads to absorb leakage while you wait for other treatments to take effect. Physical Treatments  Electrical stimulation. Electrodes send gentle pulses of electricity to strengthen the nerves or muscles that help to control the bladder. Sometimes, the electrodes are placed outside of the body. In other cases, they might be placed inside the body (implanted). This treatment can take several months to have an effect.  Supportive devices. Women may need a plastic device that fits into the vagina and supports the bladder  (pessary). Medicines Several medicines can help treat overactive bladder and are usually used along with other treatments. Some are injected into the muscles involved in urination. Others come in pill form. Your health care provider may prescribe:  Antispasmodics. These medicines block the signals that the nerves send to the bladder. This keeps the bladder from releasing urine at the wrong time.  Tricyclic antidepressants. These types of antidepressants also relax bladder muscles.  Surgery  You may have a device implanted to help manage the nerve signals that indicate when you need to urinate.  You may have surgery to implant electrodes for electrical stimulation.  Sometimes, very severe cases of overactive bladder require surgery to change the shape of the bladder. Follow these instructions at home:  Take medicines only as directed by your health care provider.  Use any implants or a pessary as directed by your health care provider.  Make any diet or lifestyle changes that are recommended by your health care provider. These might include: ? Drinking less fluid or drinking at different times of the day. If you need to urinate often during the night, you may need to stop drinking fluids early in the evening. ? Cutting down on caffeine or alcohol. Both can make an  overactive bladder worse. Caffeine is found in coffee, tea, and sodas. ? Doing Kegel exercises to strengthen muscles. ? Losing weight if you need to. ? Eating a healthy and balanced diet to prevent constipation.  Keep a journal or log to track how much and when you drink and also when you feel the need to urinate. This will help your health care provider to monitor your condition. Contact a health care provider if:  Your symptoms do not get better after treatment.  Your pain and discomfort are getting worse.  You have more frequent urges to urinate.  You have a fever. Get help right away if: You are not able to control  your bladder at all. This information is not intended to replace advice given to you by your health care provider. Make sure you discuss any questions you have with your health care provider. Document Released: 08/03/2009 Document Revised: 03/14/2016 Document Reviewed: 03/02/2014 Elsevier Interactive Patient Education  2018 [employer].  Urinary Frequency, Adult Urinary frequency means urinating more often than usual. People with urinary frequency urinate at least 8 times in 24 hours, even if they drink a normal amount of fluid. Although they urinate more often than normal, the total amount of urine produced in a day may be normal. Urinary frequency is also called pollakiuria. What are the causes? This condition may be caused by:  A urinary tract infection.  Obesity.  Bladder problems, such as bladder stones.  Caffeine or alcohol.  Eating food or drinking fluids that irritate the bladder. These include coffee, tea, soda, artificial sweeteners, citrus, tomato-based foods, and chocolate.  Certain medicines, such as medicines that help the body get rid of extra fluid (diuretics).  Muscle or nerve weakness.  Overactive bladder.  Chronic diabetes.  Interstitial cystitis.  In men, problems with the prostate, such as an enlarged prostate.  In women, pregnancy.  In some cases, the cause may not be known. What increases the risk? This condition is more likely to develop in:  Women who have gone through menopause.  Men with prostate problems.  People with a disease or injury that affects the nerves or spinal cord.  People who have or have had a condition that affects the brain, such as a stroke.  What are the signs or symptoms? Symptoms of this condition include:  Feeling an urgent need to urinate often. The stress and anxiety of needing to find a bathroom quickly can make this urge worse.  Urinating 8 or more times in 24 hours.  Urinating as often as every 1 to 2  hours.  How is this diagnosed? This condition is diagnosed based on your symptoms, your medical history, and a physical exam. You may have tests, such as:  Blood tests.  Urine tests.  Imaging tests, such as X-rays or ultrasounds.  A bladder test.  A test of your neurological system. This is the body system that senses the need to urinate.  A test to check for problems in the urethra and bladder called cystoscopy.  You may also be asked to keep a bladder diary. A bladder diary is a record of what you eat and drink, how often you urinate, and how much you urinate. You may need to see a health care provider who specializes in conditions of the urinary tract (urologist) or kidneys (nephrologist). How is this treated? Treatment for this condition depends on the cause. Sometimes the condition goes away on its own and treatment is not necessary. If treatment is needed,  it may include:  Taking medicine.  Learning exercises that strengthen the muscles that help control urination.  Following a bladder training program. This may include: ? Learning to delay going to the bathroom. ? Double urinating (voiding). This helps if you are not completely emptying your bladder. ? Scheduled voiding.  Making diet changes, such as: ? Avoiding caffeine. ? Drinking fewer fluids, especially alcohol. ? Not drinking in the evening. ? Not having foods or drinks that may irritate the bladder. ? Eating foods that help prevent or ease constipation. Constipation can make this condition worse.  Having the nerves in your bladder stimulated. There are two options for stimulating the nerves to your bladder: ? Outpatient electrical nerve stimulation. This is done by your health care provider. ? Surgery to implant a bladder pacemaker. The pacemaker helps to control the urge to urinate.  Follow these instructions at home:  Keep a bladder diary if told to by your health care provider.  Take over-the-counter and  prescription medicines only as told by your health care provider.  Do any exercises as told by your health care provider.  Follow a bladder training program as told by your health care provider.  Make any recommended diet changes.  Keep all follow-up visits as told by your health care provider. This is important. Contact a health care provider if:  You start urinating more often.  You feel pain or irritation when you urinate.  You notice blood in your urine.  Your urine looks cloudy.  You develop a fever.  You begin vomiting. Get help right away if:  You are unable to urinate. This information is not intended to replace advice given to you by your health care provider. Make sure you discuss any questions you have with your health care provider. Document Released: 08/03/2009 Document Revised: 11/08/2015 Document Reviewed: 05/03/2015 Elsevier Interactive Patient Education  [employer].

## 2018-09-28 NOTE — Progress Notes (Signed)
Barbara Dennis 84 y.o.   Chief Complaint  Patient presents with  . Fall    follow up hospital  08/18/2018 - fx ribs and ED visit 08/24/2018 vomiting with abd pain  . Medication Refill    lisinopril    HISTORY OF PRESENT ILLNESS: This is a 84 y.o. female here for follow-up, admitted to the hospital on 08/18/2018 after a fall during which she broke 2 ribs on the right side.  She was discharged to a rehab facility.  Did well and is now home with daughter.  Ambulates with assistance of a wheelchair.  Has a history of overactive bladder and recurrent UTIs.  Daughter complaining of urinary frequency.  No other complaints today or medical concerns.  HPI   Prior to Admission medications   Medication Sig Start Date End Date Taking? Authorizing Provider  albuterol (PROVENTIL HFA;VENTOLIN HFA) 108 (90 Base) MCG/ACT inhaler Inhale 2 puffs into the lungs every 4 (four) hours as needed for wheezing or shortness of breath. 09/18/18  Yes Hennie Duos, MD  calcium elemental as carbonate (TUMS ULTRA 1000) 400 MG chewable tablet Chew 3 tablets (1,200 mg total) by mouth 2 times daily at 12 noon and 4 pm. 09/18/18  Yes Hennie Duos, MD  lisinopril (PRINIVIL,ZESTRIL) 20 MG tablet Take 1 tablet (20 mg total) by mouth daily. 09/18/18  Yes Hennie Duos, MD  mirabegron ER (MYRBETRIQ) 25 MG TB24 tablet Take 1 tablet (25 mg total) by mouth daily. 09/18/18  Yes Hennie Duos, MD  omeprazole (PRILOSEC) 20 MG capsule TAKE 2 CAPSULES BY MOUTH TWICE DAILY BEFORE A MEAL 09/18/18  Yes Hennie Duos, MD  acetaminophen (TYLENOL) 500 MG tablet Take 2 tablets (1,000 mg total) by mouth every 8 (eight) hours. Patient not taking: Reported on 09/28/2018 07/23/18   Jill Alexanders, PA-C  bismuth subsalicylate (PEPTO BISMOL) 262 MG chewable tablet Chew 2 tablets (524 mg total) by mouth as needed. Patient not taking: Reported on 09/28/2018 09/18/18   Hennie Duos, MD  diclofenac sodium (VOLTAREN) 1 % GEL  Apply 2 g topically 4 (four) times daily. 09/18/18   Hennie Duos, MD  docusate sodium (COLACE) 100 MG capsule Take 1 capsule (100 mg total) by mouth 2 (two) times daily. 09/18/18   Hennie Duos, MD  Glucos-Chond-Hyal Ac-Ca Fructo (MOVE FREE JOINT HEALTH ADVANCE) TABS Take 1 tablet by mouth daily. Patient not taking: Reported on 09/28/2018 09/18/18   Hennie Duos, MD  Multiple Vitamin (MULTIVITAMIN WITH MINERALS) TABS tablet Take 1 tablet by mouth daily. Patient not taking: Reported on 09/28/2018 09/18/18   Hennie Duos, MD  ondansetron (ZOFRAN-ODT) 4 MG disintegrating tablet Take 1 tablet (4 mg total) by mouth every 6 (six) hours as needed for nausea. Patient not taking: Reported on 09/28/2018 09/18/18   Hennie Duos, MD  polyethylene glycol powder [facility]) powder Take 17 g by mouth 2 (two) times daily as needed for moderate constipation. Patient not taking: Reported on 09/28/2018 09/18/18   Hennie Duos, MD    No Known Allergies  Patient Active Problem List   Diagnosis Date Noted  . Osteoporosis 09/12/2018  . Gastritis 08/29/2018  . Overactive bladder 08/29/2018  . Gastritis and gastroduodenitis   . Epigastric pain 08/11/2018  . O2 dependent 08/11/2018  . T12 compression fracture ([place]) 08/02/2018  . Acute kidney injury ([place]) 08/02/2018  . Hyponatremia 08/02/2018  . Multiple rib fractures 07/19/2018  . Nausea and vomiting 07/16/2018  . Gastroesophageal  reflux disease 07/16/2018  . Generalized abdominal pain 05/01/2018  . Chronic pain syndrome 01/07/2018  . Chronic obstructive pulmonary disease ([place]) 08/20/2017  . Multinodular goiter 08/15/2017  . Musculoskeletal pain 07/15/2017  . Bilateral leg pain 07/15/2017  . Chronic bilateral low back pain without sciatica 07/15/2017  . Essential hypertension, benign 07/15/2017  . Hypercalcemia 07/15/2017  . Depression with anxiety 01/06/2015  . 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 HTN, goal  below 150/90 06/17/2013  . DJD (degenerative joint disease) 02/16/2013  . Tobacco user 09/23/2012  . Menopause 08/21/2012  . Postmenopausal atrophic vaginitis 08/21/2012    Past Medical History:  Diagnosis Date  . Hypertension   . Mass of right side of neck   . Oxygen dependent     Past Surgical History:  Procedure Laterality Date  . APPENDECTOMY    . BIOPSY  08/24/2018   Procedure: BIOPSY;  Surgeon: Thornton Park, MD;  Location: [facility];  Service: Gastroenterology;;  . ESOPHAGOGASTRODUODENOSCOPY (EGD) WITH PROPOFOL N/A 08/24/2018   Procedure: ESOPHAGOGASTRODUODENOSCOPY (EGD) WITH PROPOFOL;  Surgeon: Thornton Park, MD;  Location: [facility];  Service: Gastroenterology;  Laterality: N/A;    Social History   Socioeconomic History  . Marital status: Widowed    Spouse name: Not on file  . Number of children: Not on file  . Years of education: Not on file  . Highest education level: Not on file  Occupational History  . Not on file  Social Needs  . Financial resource strain: Not on file  . Food insecurity:    Worry: Not on file    Inability: Not on file  . Transportation needs:    Medical: Not on file    Non-medical: Not on file  Tobacco Use  . Smoking status: Current Every Day Smoker    Packs/day: 0.70    Years: 60.00    Pack years: 42.00    Types: Cigarettes  . Smokeless tobacco: Never Used  Substance and Sexual Activity  . Alcohol use: No    Alcohol/week: 0.0 standard drinks  . Drug use: No  . Sexual activity: Not on file  Lifestyle  . Physical activity:    Days per week: Not on file    Minutes per session: Not on file  . Stress: Not on file  Relationships  . Social connections:    Talks on phone: Not on file    Gets together: Not on file    Attends religious service: Not on file    Active member of club or organization: Not on file    Attends meetings of clubs or organizations: Not on file    Relationship status: Not on file  . Intimate  partner violence:    Fear of current or ex partner: Not on file    Emotionally abused: Not on file    Physically abused: Not on file    Forced sexual activity: Not on file  Other Topics Concern  . Not on file  Social History Narrative   Marital status: widowed 28 years ago.  From [place]; moved to [place] 1974.     Children: none       Lives:  Alone; brother in law is Psychologist, sport and exercise who is 66.      Employment: retired.       Tobacco:  1/2 ppd       Alcohol:  None      Exercise:  Walking daily.      ADLs: no driving.    Family History  Problem Relation Age of Onset  . Hypercalcemia Neg Hx   . Osteoporosis Neg Hx      Review of Systems  Constitutional: Negative.  Negative for chills and fever.  Eyes: Negative for blurred vision and double vision.  Respiratory: Negative.  Negative for cough and shortness of breath.   Cardiovascular: Negative.  Negative for chest pain and palpitations.  Gastrointestinal: Negative.  Negative for abdominal pain, diarrhea, nausea and vomiting.  Genitourinary: Positive for frequency. Negative for dysuria and hematuria.  Skin: Negative.  Negative for rash.  Neurological: Negative for dizziness and headaches.  Endo/Heme/Allergies: Negative.   All other systems reviewed and are negative.  Vitals:   09/28/18 1059  BP: 129/69  Pulse: 88  Resp: 16  Temp: 98.1 F (36.7 C)  SpO2: 100%     Physical Exam  Constitutional: She is oriented to person, place, and time. She appears well-developed and well-nourished.  HENT:  Head: Normocephalic and atraumatic.  Mouth/Throat: Oropharynx is clear and moist.  Eyes: Pupils are equal, round, and reactive to light. EOM are normal.  Neck: Normal range of motion. Neck supple.  Cardiovascular: Normal rate and regular rhythm.  Pulmonary/Chest: Effort normal and breath sounds normal.  Abdominal: Soft. She exhibits no distension. There is no tenderness.  Musculoskeletal: Normal range of motion.  Neurological: She is alert  and oriented to person, place, and time. No sensory deficit. She exhibits normal muscle tone.  Skin: Skin is warm and dry.  Psychiatric: She has a normal mood and affect. Her behavior is normal.  Vitals reviewed.   Results for orders placed or performed in visit on 09/28/18 (from the past 24 hour(s))  POCT urinalysis dipstick     Status: Abnormal   Collection Time: 09/28/18 11:51 AM  Result Value Ref Range   Color, UA yellow yellow   Clarity, UA clear clear   Glucose, UA negative negative mg/dL   Bilirubin, UA negative negative   Ketones, POC UA trace (5) (A) negative mg/dL   Spec Grav, UA 1.015 1.010 - 1.025   Blood, UA trace-intact (A) negative   pH, UA 7.0 5.0 - 8.0   Protein Ur, POC negative negative mg/dL   Urobilinogen, UA 0.2 0.2 or 1.0 E.U./dL   Nitrite, UA Negative Negative   Leukocytes, UA Negative Negative    ASSESSMENT & PLAN: Mariyana was seen today for fall and medication refill.  Diagnoses and all orders for this visit:  Urinary frequency -     POCT urinalysis dipstick -     Urine Culture -     ciprofloxacin (CIPRO) 500 MG tablet; Take 1 tablet (500 mg total) by mouth 2 (two) times daily.  Overactive bladder    Patient Instructions       If you have lab work done today you will be contacted with your lab results within the next 2 weeks.  If you have not heard from [place] then please contact us. The fastest way to get your results is to register for My Chart.   IF you received an x-ray today, you will receive an invoice from [facility]. Please contact [facility] at [phone] with questions or concerns regarding your invoice.   IF you received labwork today, you will receive an invoice from [facility]. Please contact [facility] at [phone] with questions or concerns regarding your invoice.   Our billing staff will not be able to assist you with questions regarding bills from these companies.  You will be contacted with the lab results  as soon as they are available. The fastest way to get your results is to activate your My Chart account. Instructions are located on the last page of this paperwork. If you have not heard from [place] regarding the results in 2 weeks, please contact this office.     Overactive Bladder, Adult Overactive bladder is a group of urinary symptoms. With overactive bladder, you may suddenly feel the need to pass urine (urinate) right away. After feeling this sudden urge, you might also leak urine if you cannot get to the bathroom fast enough (urinary incontinence). These symptoms might interfere with your daily work or social activities. Overactive bladder symptoms may also wake you up at night. Overactive bladder affects the nerve signals between your bladder and your brain. Your bladder may get the signal to empty before it is full. Very sensitive muscles can also make your bladder squeeze too soon. What are the causes? Many things can cause an overactive bladder. Possible causes include:  Urinary tract infection.  Infection of nearby tissues, such as the prostate.  Prostate enlargement.  Being pregnant with twins or more (multiples).  Surgery on the uterus or urethra.  Bladder stones, inflammation, or tumors.  Drinking too much caffeine or alcohol.  Certain medicines, especially those that you take to help your body get rid of extra fluid (diuretics) by increasing urine production.  Muscle or nerve weakness, especially from: ? A spinal cord injury. ? Stroke. ? Multiple sclerosis. ? Parkinson disease.  Diabetes. This can cause a high urine volume that fills the bladder so quickly that the normal urge to urinate is triggered very strongly.  Constipation. A buildup of too much stool can put pressure on your bladder.  What increases the risk? You may be at greater risk for overactive bladder if you:  Are an older adult.  Smoke.  Are going through menopause.  Have prostate  problems.  Have a neurological disease, such as stroke, dementia, Parkinson disease, or multiple sclerosis (MS).  Eat or drink things that irritate the bladder. These include alcohol, spicy food, and caffeine.  Are overweight or obese.  What are the signs or symptoms? The signs and symptoms of an overactive bladder include:  Sudden, strong urges to urinate.  Leaking urine.  Urinating eight or more times per day.  Waking up to urinate two or more times per night.  How is this diagnosed? Your health care provider may suspect overactive bladder based on your symptoms. The health care provider will do a physical exam and take your medical history. Blood or urine tests may also be done. For example, you might need to have a bladder function test to check how well you can hold your urine. You might also need to see a health care provider who specializes in the urinary tract (urologist). How is this treated? Treatment for overactive bladder depends on the cause of your condition and whether it is mild or severe. Certain treatments can be done in your health care provider's office or clinic. You can also make lifestyle changes at home. Options include: Behavioral Treatments  Biofeedback. A specialist uses sensors to help you become aware of your body's signals.  Keeping a daily log of when you need to urinate and what happens after the urge. This may help you manage your condition.  Bladder training. This helps you learn to control the urge to urinate by following a schedule that directs you to urinate at regular intervals (timed voiding). At first, you might have  to wait a few minutes after feeling the urge. In time, you should be able to schedule bathroom visits an hour or more apart.  Kegel exercises. These are exercises to strengthen the pelvic floor muscles, which support the bladder. Toning these muscles can help you control urination, even if your bladder muscles are overactive. A  specialist will teach you how to do these exercises correctly. They require daily practice.  Weight loss. If you are obese or overweight, losing weight might relieve your symptoms of overactive bladder. Talk to your health care provider about losing weight and whether there is a specific program or method that would work best for you.  Diet change. This might help if constipation is making your overactive bladder worse. Your health care provider or a dietitian can explain ways to change what you eat to ease constipation. You might also need to consume less alcohol and caffeine or drink other fluids at different times of the day.  Stopping smoking.  Wearing pads to absorb leakage while you wait for other treatments to take effect. Physical Treatments  Electrical stimulation. Electrodes send gentle pulses of electricity to strengthen the nerves or muscles that help to control the bladder. Sometimes, the electrodes are placed outside of the body. In other cases, they might be placed inside the body (implanted). This treatment can take several months to have an effect.  Supportive devices. Women may need a plastic device that fits into the vagina and supports the bladder (pessary). Medicines Several medicines can help treat overactive bladder and are usually used along with other treatments. Some are injected into the muscles involved in urination. Others come in pill form. Your health care provider may prescribe:  Antispasmodics. These medicines block the signals that the nerves send to the bladder. This keeps the bladder from releasing urine at the wrong time.  Tricyclic antidepressants. These types of antidepressants also relax bladder muscles.  Surgery  You may have a device implanted to help manage the nerve signals that indicate when you need to urinate.  You may have surgery to implant electrodes for electrical stimulation.  Sometimes, very severe cases of overactive bladder require  surgery to change the shape of the bladder. Follow these instructions at home:  Take medicines only as directed by your health care provider.  Use any implants or a pessary as directed by your health care provider.  Make any diet or lifestyle changes that are recommended by your health care provider. These might include: ? Drinking less fluid or drinking at different times of the day. If you need to urinate often during the night, you may need to stop drinking fluids early in the evening. ? Cutting down on caffeine or alcohol. Both can make an overactive bladder worse. Caffeine is found in coffee, tea, and sodas. ? Doing Kegel exercises to strengthen muscles. ? Losing weight if you need to. ? Eating a healthy and balanced diet to prevent constipation.  Keep a journal or log to track how much and when you drink and also when you feel the need to urinate. This will help your health care provider to monitor your condition. Contact a health care provider if:  Your symptoms do not get better after treatment.  Your pain and discomfort are getting worse.  You have more frequent urges to urinate.  You have a fever. Get help right away if: You are not able to control your bladder at all. This information is not intended to replace advice given to you  by your health care provider. Make sure you discuss any questions you have with your health care provider. Document Released: 08/03/2009 Document Revised: 03/14/2016 Document Reviewed: 03/02/2014 Elsevier Interactive Patient Education  2018 [employer].  Urinary Frequency, Adult Urinary frequency means urinating more often than usual. People with urinary frequency urinate at least 8 times in 24 hours, even if they drink a normal amount of fluid. Although they urinate more often than normal, the total amount of urine produced in a day may be normal. Urinary frequency is also called pollakiuria. What are the causes? This condition may be caused  by:  A urinary tract infection.  Obesity.  Bladder problems, such as bladder stones.  Caffeine or alcohol.  Eating food or drinking fluids that irritate the bladder. These include coffee, tea, soda, artificial sweeteners, citrus, tomato-based foods, and chocolate.  Certain medicines, such as medicines that help the body get rid of extra fluid (diuretics).  Muscle or nerve weakness.  Overactive bladder.  Chronic diabetes.  Interstitial cystitis.  In men, problems with the prostate, such as an enlarged prostate.  In women, pregnancy.  In some cases, the cause may not be known. What increases the risk? This condition is more likely to develop in:  Women who have gone through menopause.  Men with prostate problems.  People with a disease or injury that affects the nerves or spinal cord.  People who have or have had a condition that affects the brain, such as a stroke.  What are the signs or symptoms? Symptoms of this condition include:  Feeling an urgent need to urinate often. The stress and anxiety of needing to find a bathroom quickly can make this urge worse.  Urinating 8 or more times in 24 hours.  Urinating as often as every 1 to 2 hours.  How is this diagnosed? This condition is diagnosed based on your symptoms, your medical history, and a physical exam. You may have tests, such as:  Blood tests.  Urine tests.  Imaging tests, such as X-rays or ultrasounds.  A bladder test.  A test of your neurological system. This is the body system that senses the need to urinate.  A test to check for problems in the urethra and bladder called cystoscopy.  You may also be asked to keep a bladder diary. A bladder diary is a record of what you eat and drink, how often you urinate, and how much you urinate. You may need to see a health care provider who specializes in conditions of the urinary tract (urologist) or kidneys (nephrologist). How is this treated? Treatment  for this condition depends on the cause. Sometimes the condition goes away on its own and treatment is not necessary. If treatment is needed, it may include:  Taking medicine.  Learning exercises that strengthen the muscles that help control urination.  Following a bladder training program. This may include: ? Learning to delay going to the bathroom. ? Double urinating (voiding). This helps if you are not completely emptying your bladder. ? Scheduled voiding.  Making diet changes, such as: ? Avoiding caffeine. ? Drinking fewer fluids, especially alcohol. ? Not drinking in the evening. ? Not having foods or drinks that may irritate the bladder. ? Eating foods that help prevent or ease constipation. Constipation can make this condition worse.  Having the nerves in your bladder stimulated. There are two options for stimulating the nerves to your bladder: ? Outpatient electrical nerve stimulation. This is done by your health care provider. ?  Surgery to implant a bladder pacemaker. The pacemaker helps to control the urge to urinate.  Follow these instructions at home:  Keep a bladder diary if told to by your health care provider.  Take over-the-counter and prescription medicines only as told by your health care provider.  Do any exercises as told by your health care provider.  Follow a bladder training program as told by your health care provider.  Make any recommended diet changes.  Keep all follow-up visits as told by your health care provider. This is important. Contact a health care provider if:  You start urinating more often.  You feel pain or irritation when you urinate.  You notice blood in your urine.  Your urine looks cloudy.  You develop a fever.  You begin vomiting. Get help right away if:  You are unable to urinate. This information is not intended to replace advice given to you by your health care provider. Make sure you discuss any questions you have with  your health care provider. Document Released: 08/03/2009 Document Revised: 11/08/2015 Document Reviewed: 05/03/2015 Elsevier Interactive Patient Education  2018 [employer] Inc[employer]      Agustina Caroli, MD [facility]

## 2018-09-29 DIAGNOSIS — J449 Chronic obstructive pulmonary disease, unspecified: Secondary | ICD-10-CM | POA: Diagnosis not present

## 2018-09-29 DIAGNOSIS — G894 Chronic pain syndrome: Secondary | ICD-10-CM | POA: Diagnosis not present

## 2018-09-29 DIAGNOSIS — M1990 Unspecified osteoarthritis, unspecified site: Secondary | ICD-10-CM | POA: Diagnosis not present

## 2018-09-29 DIAGNOSIS — S2241XD Multiple fractures of ribs, right side, subsequent encounter for fracture with routine healing: Secondary | ICD-10-CM | POA: Diagnosis not present

## 2018-09-29 DIAGNOSIS — M810 Age-related osteoporosis without current pathological fracture: Secondary | ICD-10-CM | POA: Diagnosis not present

## 2018-09-29 DIAGNOSIS — S22080A Wedge compression fracture of T11-T12 vertebra, initial encounter for closed fracture: Secondary | ICD-10-CM | POA: Diagnosis not present

## 2018-09-29 DIAGNOSIS — Z9181 History of falling: Secondary | ICD-10-CM | POA: Diagnosis not present

## 2018-09-29 DIAGNOSIS — W19XXXD Unspecified fall, subsequent encounter: Secondary | ICD-10-CM | POA: Diagnosis not present

## 2018-09-29 DIAGNOSIS — I10 Essential (primary) hypertension: Secondary | ICD-10-CM | POA: Diagnosis not present

## 2018-09-29 DIAGNOSIS — Z720 Tobacco use: Secondary | ICD-10-CM | POA: Diagnosis not present

## 2018-09-30 ENCOUNTER — Encounter: Payer: Self-pay | Admitting: Radiology

## 2018-09-30 LAB — URINE CULTURE

## 2018-10-01 DIAGNOSIS — W19XXXD Unspecified fall, subsequent encounter: Secondary | ICD-10-CM | POA: Diagnosis not present

## 2018-10-01 DIAGNOSIS — Z720 Tobacco use: Secondary | ICD-10-CM | POA: Diagnosis not present

## 2018-10-01 DIAGNOSIS — S2241XD Multiple fractures of ribs, right side, subsequent encounter for fracture with routine healing: Secondary | ICD-10-CM | POA: Diagnosis not present

## 2018-10-01 DIAGNOSIS — M810 Age-related osteoporosis without current pathological fracture: Secondary | ICD-10-CM | POA: Diagnosis not present

## 2018-10-01 DIAGNOSIS — J449 Chronic obstructive pulmonary disease, unspecified: Secondary | ICD-10-CM | POA: Diagnosis not present

## 2018-10-01 DIAGNOSIS — M1990 Unspecified osteoarthritis, unspecified site: Secondary | ICD-10-CM | POA: Diagnosis not present

## 2018-10-01 DIAGNOSIS — Z9181 History of falling: Secondary | ICD-10-CM | POA: Diagnosis not present

## 2018-10-01 DIAGNOSIS — I10 Essential (primary) hypertension: Secondary | ICD-10-CM | POA: Diagnosis not present

## 2018-10-01 DIAGNOSIS — G894 Chronic pain syndrome: Secondary | ICD-10-CM | POA: Diagnosis not present

## 2018-10-01 DIAGNOSIS — S22080A Wedge compression fracture of T11-T12 vertebra, initial encounter for closed fracture: Secondary | ICD-10-CM | POA: Diagnosis not present

## 2018-10-02 ENCOUNTER — Telehealth: Payer: Self-pay | Admitting: Emergency Medicine

## 2018-10-02 ENCOUNTER — Other Ambulatory Visit: Payer: Self-pay | Admitting: Emergency Medicine

## 2018-10-02 DIAGNOSIS — J449 Chronic obstructive pulmonary disease, unspecified: Secondary | ICD-10-CM | POA: Diagnosis not present

## 2018-10-02 DIAGNOSIS — I10 Essential (primary) hypertension: Secondary | ICD-10-CM | POA: Diagnosis not present

## 2018-10-02 DIAGNOSIS — M1990 Unspecified osteoarthritis, unspecified site: Secondary | ICD-10-CM | POA: Diagnosis not present

## 2018-10-02 DIAGNOSIS — M810 Age-related osteoporosis without current pathological fracture: Secondary | ICD-10-CM | POA: Diagnosis not present

## 2018-10-02 DIAGNOSIS — S22080A Wedge compression fracture of T11-T12 vertebra, initial encounter for closed fracture: Secondary | ICD-10-CM | POA: Diagnosis not present

## 2018-10-02 DIAGNOSIS — S2241XD Multiple fractures of ribs, right side, subsequent encounter for fracture with routine healing: Secondary | ICD-10-CM | POA: Diagnosis not present

## 2018-10-02 DIAGNOSIS — W19XXXD Unspecified fall, subsequent encounter: Secondary | ICD-10-CM | POA: Diagnosis not present

## 2018-10-02 DIAGNOSIS — N3281 Overactive bladder: Secondary | ICD-10-CM

## 2018-10-02 DIAGNOSIS — Z9181 History of falling: Secondary | ICD-10-CM | POA: Diagnosis not present

## 2018-10-02 DIAGNOSIS — G894 Chronic pain syndrome: Secondary | ICD-10-CM | POA: Diagnosis not present

## 2018-10-02 DIAGNOSIS — Z720 Tobacco use: Secondary | ICD-10-CM | POA: Diagnosis not present

## 2018-10-02 NOTE — Telephone Encounter (Signed)
Copied from [place] [id number]. Topic: Quick Communication - See Telephone Encounter >> Oct 02, 2018  1:27 PM Bea Graff, NT wrote: CRM for notification. See Telephone encounter for: 10/02/18. Robin with [facility] calling to inform the office that she fell in her home yesterday. Also pt reports she has finished her antibiotic for a UTI but still going to the restroom every 15 mins. No injuries reported with the fall. Still having back pain from UTI. Shirlean Mylar did recommend a depends pad, pt declined.  CB#: [phone]

## 2018-10-02 NOTE — Telephone Encounter (Signed)
Back pain is not from a UTI.  Urinary frequency is secondary to an overactive bladder problem that needs follow-up with a urologist.  Referral sent.  Thanks.

## 2018-10-05 DIAGNOSIS — W19XXXD Unspecified fall, subsequent encounter: Secondary | ICD-10-CM | POA: Diagnosis not present

## 2018-10-05 DIAGNOSIS — M810 Age-related osteoporosis without current pathological fracture: Secondary | ICD-10-CM | POA: Diagnosis not present

## 2018-10-05 DIAGNOSIS — Z9181 History of falling: Secondary | ICD-10-CM | POA: Diagnosis not present

## 2018-10-05 DIAGNOSIS — M1990 Unspecified osteoarthritis, unspecified site: Secondary | ICD-10-CM | POA: Diagnosis not present

## 2018-10-05 DIAGNOSIS — I10 Essential (primary) hypertension: Secondary | ICD-10-CM | POA: Diagnosis not present

## 2018-10-05 DIAGNOSIS — S2241XD Multiple fractures of ribs, right side, subsequent encounter for fracture with routine healing: Secondary | ICD-10-CM | POA: Diagnosis not present

## 2018-10-05 DIAGNOSIS — Z720 Tobacco use: Secondary | ICD-10-CM | POA: Diagnosis not present

## 2018-10-05 DIAGNOSIS — S22080A Wedge compression fracture of T11-T12 vertebra, initial encounter for closed fracture: Secondary | ICD-10-CM | POA: Diagnosis not present

## 2018-10-05 DIAGNOSIS — G894 Chronic pain syndrome: Secondary | ICD-10-CM | POA: Diagnosis not present

## 2018-10-05 DIAGNOSIS — J449 Chronic obstructive pulmonary disease, unspecified: Secondary | ICD-10-CM | POA: Diagnosis not present

## 2018-10-05 NOTE — Telephone Encounter (Signed)
Copied from [place] [id number]. Topic: Quick Communication - Home Health Verbal Orders >> Sep 24, 2018  1:30 PM Alanda Slim E wrote: Caller/Agency: [facility] Number: [phone] VM can be left  Requesting OT Frequency: 1x a week for 2 weeks  And 1x a week for 1 week >> Oct 05, 2018  1:54 PM Windy Kalata wrote: Barbara Dennis is calling from [facility], the patient was diagnosed a few weeks ago with a UTI and she has finished her medication but is still urinating every 1/2 hours. She is sleeping a lot due to being up so  Much.   Family member that is with her can translate.   Hardin Negus her call back is [phone]  Caregiver is asking if she can get a handicap sticker as she has to take her to her appt? Please advise, her number is [phone]

## 2018-10-06 DIAGNOSIS — M810 Age-related osteoporosis without current pathological fracture: Secondary | ICD-10-CM | POA: Diagnosis not present

## 2018-10-06 DIAGNOSIS — Z720 Tobacco use: Secondary | ICD-10-CM | POA: Diagnosis not present

## 2018-10-06 DIAGNOSIS — W19XXXD Unspecified fall, subsequent encounter: Secondary | ICD-10-CM | POA: Diagnosis not present

## 2018-10-06 DIAGNOSIS — S22080A Wedge compression fracture of T11-T12 vertebra, initial encounter for closed fracture: Secondary | ICD-10-CM | POA: Diagnosis not present

## 2018-10-06 DIAGNOSIS — Z9181 History of falling: Secondary | ICD-10-CM | POA: Diagnosis not present

## 2018-10-06 DIAGNOSIS — S2241XD Multiple fractures of ribs, right side, subsequent encounter for fracture with routine healing: Secondary | ICD-10-CM | POA: Diagnosis not present

## 2018-10-06 DIAGNOSIS — J449 Chronic obstructive pulmonary disease, unspecified: Secondary | ICD-10-CM | POA: Diagnosis not present

## 2018-10-06 DIAGNOSIS — G894 Chronic pain syndrome: Secondary | ICD-10-CM | POA: Diagnosis not present

## 2018-10-06 DIAGNOSIS — I10 Essential (primary) hypertension: Secondary | ICD-10-CM | POA: Diagnosis not present

## 2018-10-06 DIAGNOSIS — M1990 Unspecified osteoarthritis, unspecified site: Secondary | ICD-10-CM | POA: Diagnosis not present

## 2018-10-06 NOTE — Telephone Encounter (Signed)
Verbal given left voicemail

## 2018-10-07 ENCOUNTER — Other Ambulatory Visit: Payer: Self-pay | Admitting: Emergency Medicine

## 2018-10-07 DIAGNOSIS — M79605 Pain in left leg: Secondary | ICD-10-CM

## 2018-10-07 DIAGNOSIS — M545 Low back pain: Secondary | ICD-10-CM

## 2018-10-07 DIAGNOSIS — M79604 Pain in right leg: Secondary | ICD-10-CM

## 2018-10-07 DIAGNOSIS — M7918 Myalgia, other site: Secondary | ICD-10-CM

## 2018-10-07 DIAGNOSIS — G8929 Other chronic pain: Secondary | ICD-10-CM

## 2018-10-07 NOTE — Telephone Encounter (Signed)
Requested medication (s) are due for refill today -no  Requested medication (s) are on the active medication list -no  Future visit scheduled -no  Last refill: 07/23/18  Notes to clinic: patient is requesting a non-delegated Rx- recorded to stop at discharge  Requested Prescriptions  Pending Prescriptions Disp Refills   traMADol (ULTRAM) 50 MG tablet [Pharmacy Med Name: TRAMADOL 50MG  TABLETS] 20 tablet     Sig: TAKE 1 TABLET(50 MG) BY MOUTH EVERY 8 HOURS AS NEEDED FOR SEVERE PAIN     Not Delegated - Analgesics:  Opioid Agonists Failed - 10/07/2018 12:53 PM      Failed - This refill cannot be delegated      Failed - Urine Drug Screen completed in last 360 days.      Passed - Valid encounter within last 6 months    Recent Outpatient Visits          1 week ago Urinary frequency   Primary Care at [facility], Ines Bloomer, MD   3 months ago Musculoskeletal pain   Primary Care at [facility], Ines Bloomer, MD   5 months ago Generalized abdominal pain   Primary Care at [facility], Ines Bloomer, MD   6 months ago Generalized abdominal pain   Primary Care at [facility], Ines Bloomer, MD   6 months ago Infective urethritis   Primary Care at [facility], Ines Bloomer, MD              Requested Prescriptions  Pending Prescriptions Disp Refills   traMADol (ULTRAM) 50 MG tablet [Pharmacy Med Name: TRAMADOL 50MG  TABLETS] 20 tablet     Sig: TAKE 1 TABLET(50 MG) BY MOUTH EVERY 8 HOURS AS NEEDED FOR SEVERE PAIN     Not Delegated - Analgesics:  Opioid Agonists Failed - 10/07/2018 12:53 PM      Failed - This refill cannot be delegated      Failed - Urine Drug Screen completed in last 360 days.      Passed - Valid encounter within last 6 months    Recent Outpatient Visits          1 week ago Urinary frequency   Primary Care at [facility], Ines Bloomer, MD   3 months ago Musculoskeletal pain   Primary Care at [facility], Ines Bloomer, MD   5 months ago  Generalized abdominal pain   Primary Care at [facility], Ines Bloomer, MD   6 months ago Generalized abdominal pain   Primary Care at [facility], Ines Bloomer, MD   6 months ago Infective urethritis   Primary Care at [facility], Ines Bloomer, MD

## 2018-10-08 DIAGNOSIS — M1990 Unspecified osteoarthritis, unspecified site: Secondary | ICD-10-CM | POA: Diagnosis not present

## 2018-10-08 DIAGNOSIS — G894 Chronic pain syndrome: Secondary | ICD-10-CM | POA: Diagnosis not present

## 2018-10-08 DIAGNOSIS — J449 Chronic obstructive pulmonary disease, unspecified: Secondary | ICD-10-CM | POA: Diagnosis not present

## 2018-10-08 DIAGNOSIS — Z9181 History of falling: Secondary | ICD-10-CM | POA: Diagnosis not present

## 2018-10-08 DIAGNOSIS — M810 Age-related osteoporosis without current pathological fracture: Secondary | ICD-10-CM | POA: Diagnosis not present

## 2018-10-08 DIAGNOSIS — Z720 Tobacco use: Secondary | ICD-10-CM | POA: Diagnosis not present

## 2018-10-08 DIAGNOSIS — S2241XD Multiple fractures of ribs, right side, subsequent encounter for fracture with routine healing: Secondary | ICD-10-CM | POA: Diagnosis not present

## 2018-10-08 DIAGNOSIS — S22080A Wedge compression fracture of T11-T12 vertebra, initial encounter for closed fracture: Secondary | ICD-10-CM | POA: Diagnosis not present

## 2018-10-08 DIAGNOSIS — I10 Essential (primary) hypertension: Secondary | ICD-10-CM | POA: Diagnosis not present

## 2018-10-08 DIAGNOSIS — W19XXXD Unspecified fall, subsequent encounter: Secondary | ICD-10-CM | POA: Diagnosis not present

## 2018-10-12 ENCOUNTER — Telehealth: Payer: Self-pay | Admitting: Emergency Medicine

## 2018-10-12 DIAGNOSIS — W19XXXD Unspecified fall, subsequent encounter: Secondary | ICD-10-CM | POA: Diagnosis not present

## 2018-10-12 DIAGNOSIS — M810 Age-related osteoporosis without current pathological fracture: Secondary | ICD-10-CM | POA: Diagnosis not present

## 2018-10-12 DIAGNOSIS — S22080A Wedge compression fracture of T11-T12 vertebra, initial encounter for closed fracture: Secondary | ICD-10-CM | POA: Diagnosis not present

## 2018-10-12 DIAGNOSIS — S2241XD Multiple fractures of ribs, right side, subsequent encounter for fracture with routine healing: Secondary | ICD-10-CM | POA: Diagnosis not present

## 2018-10-12 DIAGNOSIS — Z9181 History of falling: Secondary | ICD-10-CM | POA: Diagnosis not present

## 2018-10-12 DIAGNOSIS — M1990 Unspecified osteoarthritis, unspecified site: Secondary | ICD-10-CM | POA: Diagnosis not present

## 2018-10-12 DIAGNOSIS — I10 Essential (primary) hypertension: Secondary | ICD-10-CM | POA: Diagnosis not present

## 2018-10-12 DIAGNOSIS — G894 Chronic pain syndrome: Secondary | ICD-10-CM | POA: Diagnosis not present

## 2018-10-12 DIAGNOSIS — J449 Chronic obstructive pulmonary disease, unspecified: Secondary | ICD-10-CM | POA: Diagnosis not present

## 2018-10-12 DIAGNOSIS — Z720 Tobacco use: Secondary | ICD-10-CM | POA: Diagnosis not present

## 2018-10-12 NOTE — Telephone Encounter (Signed)
Copied from [place] [id number]. Topic: Quick Communication - Home Health Verbal Orders >> Oct 12, 2018 10:22 AM Yvette Rack wrote: Caller/Agency: Adele Dan with [facility] Number: [phone]  Requesting OT/PT/Skilled Nursing/Social Work: extension of OT Frequency: starting 10/19/18 1 time a week for 1 week, 2 times a week for 2 weeks, and 1 time a week for 1 week

## 2018-10-12 NOTE — Telephone Encounter (Signed)
Copied from [place] [id number]. Topic: Quick Communication - Home Health Verbal Orders >> Oct 12, 2018 12:20 PM Windy Kalata wrote: Caller/Agency:  Callback Number:  Requesting OT/PT/Skilled Nursing/Social Work:  Frequency:

## 2018-10-12 NOTE — Telephone Encounter (Signed)
Copied from [place] [phone]. Topic: Quick Communication - Home Health Verbal Orders >> Oct 12, 2018 12:16 PM Windy Kalata wrote: Caller/Agency:  Callback Number:  Requesting OT/PT/Skilled Nursing/Social Work:  Frequency:

## 2018-10-13 NOTE — Telephone Encounter (Signed)
Provided verbal order as requested below.

## 2018-10-15 DIAGNOSIS — M810 Age-related osteoporosis without current pathological fracture: Secondary | ICD-10-CM | POA: Diagnosis not present

## 2018-10-15 DIAGNOSIS — J9811 Atelectasis: Secondary | ICD-10-CM | POA: Diagnosis not present

## 2018-10-15 DIAGNOSIS — J208 Acute bronchitis due to other specified organisms: Secondary | ICD-10-CM | POA: Diagnosis not present

## 2018-10-15 DIAGNOSIS — R05 Cough: Secondary | ICD-10-CM | POA: Diagnosis not present

## 2018-10-15 DIAGNOSIS — I700 Atherosclerosis of aorta: Secondary | ICD-10-CM | POA: Diagnosis not present

## 2018-10-15 DIAGNOSIS — I771 Stricture of artery: Secondary | ICD-10-CM | POA: Diagnosis not present

## 2018-10-15 DIAGNOSIS — M722 Plantar fascial fibromatosis: Secondary | ICD-10-CM | POA: Diagnosis not present

## 2018-10-18 DIAGNOSIS — K2970 Gastritis, unspecified, without bleeding: Secondary | ICD-10-CM | POA: Diagnosis not present

## 2018-10-18 DIAGNOSIS — N3281 Overactive bladder: Secondary | ICD-10-CM | POA: Diagnosis not present

## 2018-10-18 DIAGNOSIS — S22080A Wedge compression fracture of T11-T12 vertebra, initial encounter for closed fracture: Secondary | ICD-10-CM | POA: Diagnosis not present

## 2018-10-18 DIAGNOSIS — M810 Age-related osteoporosis without current pathological fracture: Secondary | ICD-10-CM | POA: Diagnosis not present

## 2018-10-19 ENCOUNTER — Telehealth: Payer: Self-pay | Admitting: *Deleted

## 2018-10-19 NOTE — Telephone Encounter (Signed)
Faxed signed orders to [facility] to ATTN: Clent Ridges. Confirmation page received at 2:54 pm.

## 2018-10-20 ENCOUNTER — Other Ambulatory Visit: Payer: Self-pay

## 2018-10-20 ENCOUNTER — Ambulatory Visit (INDEPENDENT_AMBULATORY_CARE_PROVIDER_SITE_OTHER): Payer: Medicare Other | Admitting: Emergency Medicine

## 2018-10-20 VITALS — BP 174/68 | HR 76 | Temp 97.4°F | Resp 17 | Ht 62.0 in | Wt 92.6 lb

## 2018-10-20 DIAGNOSIS — R600 Localized edema: Secondary | ICD-10-CM | POA: Diagnosis not present

## 2018-10-20 DIAGNOSIS — N3281 Overactive bladder: Secondary | ICD-10-CM

## 2018-10-20 DIAGNOSIS — M7918 Myalgia, other site: Secondary | ICD-10-CM

## 2018-10-20 DIAGNOSIS — R0789 Other chest pain: Secondary | ICD-10-CM | POA: Diagnosis not present

## 2018-10-20 DIAGNOSIS — G894 Chronic pain syndrome: Secondary | ICD-10-CM | POA: Diagnosis not present

## 2018-10-20 MED ORDER — TRAMADOL HCL 50 MG PO TABS: 50.0000 mg | ORAL_TABLET | Freq: Three times a day (TID) | ORAL | 0 refills | Status: DC | PRN

## 2018-10-20 NOTE — Patient Instructions (Addendum)
   If you have lab work done today you will be contacted with your lab results within the next 2 weeks.  If you have not heard from us then please contact us. The fastest way to get your results is to register for My Chart.   IF you received an x-ray today, you will receive an invoice from [facility]. Please contact [facility] at [phone] with questions or concerns regarding your invoice.   IF you received labwork today, you will receive an invoice from [facility]. Please contact [facility] at [phone] with questions or concerns regarding your invoice.   Our billing staff will not be able to assist you with questions regarding bills from these companies.  You will be contacted with the lab results as soon as they are available. The fastest way to get your results is to activate your My Chart account. Instructions are located on the last page of this paperwork. If you have not heard from us regarding the results in 2 weeks, please contact this office.     Nonspecific Chest Pain Chest pain can be caused by many different conditions. Some causes of chest pain can be life-threatening. These will require treatment right away. Serious causes of chest pain include:  Heart attack.  A tear in the body's main blood vessel.  Redness and swelling (inflammation) around your heart.  Blood clot in your lungs. Other causes of chest pain may not be so serious. These include:  Heartburn.  Anxiety or stress.  Damage to bones or muscles in your chest.  Lung infections. Chest pain can feel like:  Pain or discomfort in your chest.  Crushing, pressure, aching, or squeezing pain.  Burning or tingling.  Dull or sharp pain that is worse when you move, cough, or take a deep breath.  Pain or discomfort that is also felt in your back, neck, jaw, shoulder, or arm, or pain that spreads to any of these areas. It is hard to know whether your pain is caused by something that is  serious or something that is not so serious. So it is important to see your doctor right away if you have chest pain. Follow these instructions at home: Medicines  Take over-the-counter and prescription medicines only as told by your doctor.  If you were prescribed an antibiotic medicine, take it as told by your doctor. Do not stop taking the antibiotic even if you start to feel better. Lifestyle   Rest as told by your doctor.  Do not use any products that contain nicotine or tobacco, such as cigarettes, e-cigarettes, and chewing tobacco. If you need help quitting, ask your doctor.  Do not drink alcohol.  Make lifestyle changes as told by your doctor. These may include: ? Getting regular exercise. Ask your doctor what activities are safe for you. ? Eating a heart-healthy diet. A diet and nutrition specialist (dietitian) can help you to learn healthy eating options. ? Staying at a healthy weight. ? Treating diabetes or high blood pressure, if needed. ? Lowering your stress. Activities such as yoga and relaxation techniques can help. General instructions  Pay attention to any changes in your symptoms. Tell your doctor about them or any new symptoms.  Avoid any activities that cause chest pain.  Keep all follow-up visits as told by your doctor. This is important. You may need more testing if your chest pain does not go away. Contact a doctor if:  Your chest pain does not go away.  You feel   depressed.  You have a fever. Get help right away if:  Your chest pain is worse.  You have a cough that gets worse, or you cough up blood.  You have very bad (severe) pain in your belly (abdomen).  You pass out (faint).  You have either of these for no clear reason: ? Sudden chest discomfort. ? Sudden discomfort in your arms, back, neck, or jaw.  You have shortness of breath at any time.  You suddenly start to sweat, or your skin gets clammy.  You feel sick to your stomach  (nauseous).  You throw up (vomit).  You suddenly feel lightheaded or dizzy.  You feel very weak or tired.  Your heart starts to beat fast, or it feels like it is skipping beats. These symptoms may be an emergency. Do not wait to see if the symptoms will go away. Get medical help right away. Call your local emergency services (911 in the [place] Do not drive yourself to the hospital. Summary  Chest pain can be caused by many different conditions. The cause may be serious and need treatment right away. If you have chest pain, see your doctor right away.  Follow your doctor's instructions for taking medicines and making lifestyle changes.  Keep all follow-up visits as told by your doctor. This includes visits for any further testing if your chest pain does not go away.  Be sure to know the signs that show that your condition has become worse. Get help right away if you have these symptoms. This information is not intended to replace advice given to you by your health care provider. Make sure you discuss any questions you have with your health care provider. Document Released: 03/25/2008 Document Revised: 04/09/2018 Document Reviewed: 04/09/2018 Elsevier Interactive Patient Education  2019 [employer]  Edema  Edema is when you have too much fluid in your body or under your skin. Edema may make your legs, feet, and ankles swell up. Swelling is also common in looser tissues, like around your eyes. This is a common condition. It gets more common as you get older. There are many possible causes of edema. Eating too much salt (sodium) and being on your feet or sitting for a long time can cause edema in your legs, feet, and ankles. Hot weather may make edema worse. Edema is usually painless. Your skin may look swollen or shiny. Follow these instructions at home:  Keep the swollen body part raised (elevated) above the level of your heart when you are sitting or lying down.  Do not sit still or  stand for a long time.  Do not wear tight clothes. Do not wear garters on your upper legs.  Exercise your legs. This can help the swelling go down.  Wear elastic bandages or support stockings as told by your doctor.  Eat a low-salt (low-sodium) diet to reduce fluid as told by your doctor.  Depending on the cause of your swelling, you may need to limit how much fluid you drink (fluid restriction).  Take over-the-counter and prescription medicines only as told by your doctor. Contact a doctor if:  Treatment is not working.  You have heart, liver, or kidney disease and have symptoms of edema.  You have sudden and unexplained weight gain. Get help right away if:  You have shortness of breath or chest pain.  You cannot breathe when you lie down.  You have pain, redness, or warmth in the swollen areas.  You have heart, liver, or  kidney disease and get edema all of a sudden.  You have a fever and your symptoms get worse all of a sudden. Summary  Edema is when you have too much fluid in your body or under your skin.  Edema may make your legs, feet, and ankles swell up. Swelling is also common in looser tissues, like around your eyes.  Raise (elevate) the swollen body part above the level of your heart when you are sitting or lying down.  Follow your doctor's instructions about diet and how much fluid you can drink (fluid restriction). This information is not intended to replace advice given to you by your health care provider. Make sure you discuss any questions you have with your health care provider. Document Released: 03/25/2008 Document Revised: 10/25/2016 Document Reviewed: 10/25/2016 Elsevier Interactive Patient Education  2019 [employer].

## 2018-10-20 NOTE — Progress Notes (Signed)
Barbara Dennis 84 y.o.   Chief Complaint  Patient presents with  . burning sensation in chest with pain, no sob x 2 weeks.  Pt     Tylenol/advil for pain and anti- inflammatory also.  Need new rx (tramadol) for pain medication as pt is out  . Shoulder, back and arm pain    Pt says her entire bodyaches, pain pill relieves the pain but when it wears off she c/o pain all over.    HISTORY OF PRESENT ILLNESS: This is a 84 y.o. female complaining of intermittent swelling of her feet along with an almost daily pain to her chest area that is worse with palpation and movement.  Patient has a history of chronic pain for which she takes Tylenol almost daily and occasional tramadol as needed.  She also has a history of a overactive bladder that is doing much better today.  Patient here with daughter.  I am using [employer] for [place] interpretation.  Patient also has a history of frequent falls that is much improved lately.  She has been getting home health and physical therapy.  No new complaints or medical concerns today.  Recently had fracture ribs on the right side.  Also has a history of COPD, hypertension, and GERD.  HPI   Prior to Admission medications   Medication Sig Start Date End Date Taking? Authorizing Provider  acetaminophen (TYLENOL) 500 MG tablet Take 2 tablets (1,000 mg total) by mouth every 8 (eight) hours. 07/23/18  Yes Jill Alexanders, PA-C  calcium elemental as carbonate (TUMS ULTRA 1000) 400 MG chewable tablet Chew 3 tablets (1,200 mg total) by mouth 2 times daily at 12 noon and 4 pm. 09/18/18  Yes Hennie Duos, MD  lisinopril (PRINIVIL,ZESTRIL) 20 MG tablet Take 1 tablet (20 mg total) by mouth daily. 09/18/18  Yes Hennie Duos, MD  mirabegron ER (MYRBETRIQ) 25 MG TB24 tablet Take 1 tablet (25 mg total) by mouth daily. 09/18/18  Yes Hennie Duos, MD  omeprazole (PRILOSEC) 20 MG capsule TAKE 2 CAPSULES BY MOUTH TWICE DAILY BEFORE A MEAL 09/18/18  Yes Hennie Duos, MD  albuterol (PROVENTIL HFA;VENTOLIN HFA) 108 (90 Base) MCG/ACT inhaler Inhale 2 puffs into the lungs every 4 (four) hours as needed for wheezing or shortness of breath. Patient not taking: Reported on 10/20/2018 09/18/18   Hennie Duos, MD  bismuth subsalicylate (PEPTO BISMOL) 262 MG chewable tablet Chew 2 tablets (524 mg total) by mouth as needed. Patient not taking: Reported on 10/20/2018 09/18/18   Hennie Duos, MD  ciprofloxacin (CIPRO) 500 MG tablet Take 1 tablet (500 mg total) by mouth 2 (two) times daily. Patient not taking: Reported on 10/20/2018 09/28/18   Horald Pollen, MD  diclofenac sodium (VOLTAREN) 1 % GEL Apply 2 g topically 4 (four) times daily. Patient not taking: Reported on 10/20/2018 09/18/18   Hennie Duos, MD  docusate sodium (COLACE) 100 MG capsule Take 1 capsule (100 mg total) by mouth 2 (two) times daily. Patient not taking: Reported on 10/20/2018 09/18/18   Hennie Duos, MD  Glucos-Chond-Hyal Ac-Ca Fructo (MOVE FREE JOINT HEALTH ADVANCE) TABS Take 1 tablet by mouth daily. Patient not taking: Reported on 09/28/2018 09/18/18   Hennie Duos, MD  Multiple Vitamin (MULTIVITAMIN WITH MINERALS) TABS tablet Take 1 tablet by mouth daily. Patient not taking: Reported on 09/28/2018 09/18/18   Hennie Duos, MD  ondansetron (ZOFRAN-ODT) 4 MG disintegrating tablet Take 1 tablet (4 mg  total) by mouth every 6 (six) hours as needed for nausea. Patient not taking: Reported on 09/28/2018 09/18/18   Hennie Duos, MD  polyethylene glycol powder [facility]) powder Take 17 g by mouth 2 (two) times daily as needed for moderate constipation. Patient not taking: Reported on 09/28/2018 09/18/18   Hennie Duos, MD    No Known Allergies  Patient Active Problem List   Diagnosis Date Noted  . Osteoporosis 09/12/2018  . Gastritis 08/29/2018  . Overactive bladder 08/29/2018  . Gastritis and gastroduodenitis   . Epigastric pain 08/11/2018    . O2 dependent 08/11/2018  . T12 compression fracture ([place]) 08/02/2018  . Acute kidney injury ([place]) 08/02/2018  . Hyponatremia 08/02/2018  . Multiple rib fractures 07/19/2018  . Nausea and vomiting 07/16/2018  . Gastroesophageal reflux disease 07/16/2018  . Generalized abdominal pain 05/01/2018  . Chronic pain syndrome 01/07/2018  . Chronic obstructive pulmonary disease ([place]) 08/20/2017  . Multinodular goiter 08/15/2017  . Musculoskeletal pain 07/15/2017  . Bilateral leg pain 07/15/2017  . Chronic bilateral low back pain without sciatica 07/15/2017  . Essential hypertension, benign 07/15/2017  . Hypercalcemia 07/15/2017  . Depression with anxiety 01/06/2015  . 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 HTN, goal below 150/90 06/17/2013  . DJD (degenerative joint disease) 02/16/2013  . Tobacco user 09/23/2012  . Menopause 08/21/2012  . Postmenopausal atrophic vaginitis 08/21/2012    Past Medical History:  Diagnosis Date  . Hypertension   . Mass of right side of neck   . Oxygen dependent     Past Surgical History:  Procedure Laterality Date  . APPENDECTOMY    . BIOPSY  08/24/2018   Procedure: BIOPSY;  Surgeon: Thornton Park, MD;  Location: [facility];  Service: Gastroenterology;;  . ESOPHAGOGASTRODUODENOSCOPY (EGD) WITH PROPOFOL N/A 08/24/2018   Procedure: ESOPHAGOGASTRODUODENOSCOPY (EGD) WITH PROPOFOL;  Surgeon: Thornton Park, MD;  Location: [facility];  Service: Gastroenterology;  Laterality: N/A;    Social History   Socioeconomic History  . Marital status: Widowed    Spouse name: Not on file  . Number of children: Not on file  . Years of education: Not on file  . Highest education level: Not on file  Occupational History  . Not on file  Social Needs  . Financial resource strain: Not on file  . Food insecurity:    Worry: Not on file    Inability: Not on file  . Transportation needs:    Medical: Not on file    Non-medical: Not on file  Tobacco  Use  . Smoking status: Current Every Day Smoker    Packs/day: 0.70    Years: 60.00    Pack years: 42.00    Types: Cigarettes  . Smokeless tobacco: Never Used  Substance and Sexual Activity  . Alcohol use: No    Alcohol/week: 0.0 standard drinks  . Drug use: No  . Sexual activity: Not on file  Lifestyle  . Physical activity:    Days per week: Not on file    Minutes per session: Not on file  . Stress: Not on file  Relationships  . Social connections:    Talks on phone: Not on file    Gets together: Not on file    Attends religious service: Not on file    Active member of club or organization: Not on file    Attends meetings of clubs or organizations: Not on file    Relationship status: Not on file  . Intimate partner violence:  Fear of current or ex partner: Not on file    Emotionally abused: Not on file    Physically abused: Not on file    Forced sexual activity: Not on file  Other Topics Concern  . Not on file  Social History Narrative   Marital status: widowed 28 years ago.  From [place]; moved to [place] 1974.     Children: none       Lives:  Alone; brother in law is Psychologist, sport and exercise who is 56.      Employment: retired.       Tobacco:  1/2 ppd       Alcohol:  None      Exercise:  Walking daily.      ADLs: no driving.    Family History  Problem Relation Age of Onset  . Hypercalcemia Neg Hx   . Osteoporosis Neg Hx      Review of Systems  Constitutional: Negative for chills and fever.  HENT: Negative for nosebleeds and sore throat.   Eyes: Negative for blurred vision and double vision.  Respiratory: Negative.  Negative for cough and shortness of breath.   Cardiovascular: Positive for chest pain and leg swelling. Negative for palpitations.  Gastrointestinal: Negative for abdominal pain, blood in stool, melena, nausea and vomiting.  Genitourinary: Positive for frequency. Negative for dysuria, flank pain and hematuria.  Musculoskeletal: Positive for back pain and joint pain.   Skin: Negative.  Negative for rash.  Neurological: Negative for dizziness and headaches.  Endo/Heme/Allergies: Negative.   All other systems reviewed and are negative.   Vitals:   10/20/18 1455  BP: (!) 174/68  Pulse: 76  Resp: 17  Temp: (!) 97.4 F (36.3 C)  SpO2: 98%   BP Readings from Last 3 Encounters:  10/20/18 (!) 174/68  09/28/18 129/69  09/18/18 120/70    Physical Exam Vitals signs reviewed.  Constitutional:      Appearance: Normal appearance.  HENT:     Head: Normocephalic and atraumatic.     Mouth/Throat:     Mouth: Mucous membranes are moist.     Pharynx: Oropharynx is clear.  Eyes:     Extraocular Movements: Extraocular movements intact.     Conjunctiva/sclera: Conjunctivae normal.     Pupils: Pupils are equal, round, and reactive to light.  Neck:     Musculoskeletal: Normal range of motion.  Cardiovascular:     Rate and Rhythm: Normal rate and regular rhythm.     Heart sounds: Normal heart sounds.  Pulmonary:     Effort: Pulmonary effort is normal.     Breath sounds: Normal breath sounds.  Chest:     Chest wall: Tenderness present.  Abdominal:     Palpations: Abdomen is soft.     Tenderness: There is no abdominal tenderness.  Musculoskeletal:     Right lower leg: Edema (Pedal edema +1) present.     Left lower leg: Edema (Pedal edema +1) present.  Skin:    General: Skin is warm and dry.     Capillary Refill: Capillary refill takes less than 2 seconds.  Neurological:     General: No focal deficit present.     Mental Status: She is alert and oriented to person, place, and time.  Psychiatric:        Mood and Affect: Mood normal.        Behavior: Behavior normal.    EKG: Normal sinus rhythm.  Normal ventricular response.  No acute ischemic changes.VR 76 A total of 40  minutes was spent in the room with the patient, greater than 50% of which was in counseling/coordination of care regarding multiple chronic medical problems and chest pain,  treatment, medications, prognosis, and need for follow-up.  ASSESSMENT & PLAN: Atypical chest pain Clinically stable.  Normal EKG.  Chest pain reproduced with palpation.  No red flag signs or symptoms.  Chronic pain most likely musculoskeletal.  Pedal edema Chronic.  Today better than usual.  Barbara Dennis was seen today for burning sensation in chest with pain, no sob x 2 weeks.  pt  and shoulder, back and arm pain.  Diagnoses and all orders for this visit:  Atypical chest pain -     EKG 12-Lead  Chronic pain syndrome  Overactive bladder  Musculoskeletal pain  Pedal edema  Other orders -     traMADol (ULTRAM) 50 MG tablet; Take 1 tablet (50 mg total) by mouth every 8 (eight) hours as needed.    Patient Instructions       If you have lab work done today you will be contacted with your lab results within the next 2 weeks.  If you have not heard from [place] then please contact us. The fastest way to get your results is to register for My Chart.   IF you received an x-ray today, you will receive an invoice from [facility]. Please contact [facility] at [phone] with questions or concerns regarding your invoice.   IF you received labwork today, you will receive an invoice from [facility]. Please contact [facility] at [phone] with questions or concerns regarding your invoice.   Our billing staff will not be able to assist you with questions regarding bills from these companies.  You will be contacted with the lab results as soon as they are available. The fastest way to get your results is to activate your My Chart account. Instructions are located on the last page of this paperwork. If you have not heard from [place] regarding the results in 2 weeks, please contact this office.     Nonspecific Chest Pain Chest pain can be caused by many different conditions. Some causes of chest pain can be life-threatening. These will require treatment right away. Serious causes  of chest pain include:  Heart attack.  A tear in the body's main blood vessel.  Redness and swelling (inflammation) around your heart.  Blood clot in your lungs. Other causes of chest pain may not be so serious. These include:  Heartburn.  Anxiety or stress.  Damage to bones or muscles in your chest.  Lung infections. Chest pain can feel like:  Pain or discomfort in your chest.  Crushing, pressure, aching, or squeezing pain.  Burning or tingling.  Dull or sharp pain that is worse when you move, cough, or take a deep breath.  Pain or discomfort that is also felt in your back, neck, jaw, shoulder, or arm, or pain that spreads to any of these areas. It is hard to know whether your pain is caused by something that is serious or something that is not so serious. So it is important to see your doctor right away if you have chest pain. Follow these instructions at home: Medicines  Take over-the-counter and prescription medicines only as told by your doctor.  If you were prescribed an antibiotic medicine, take it as told by your doctor. Do not stop taking the antibiotic even if you start to feel better. Lifestyle   Rest as told by your doctor.  Do not use  any products that contain nicotine or tobacco, such as cigarettes, e-cigarettes, and chewing tobacco. If you need help quitting, ask your doctor.  Do not drink alcohol.  Make lifestyle changes as told by your doctor. These may include: ? Getting regular exercise. Ask your doctor what activities are safe for you. ? Eating a heart-healthy diet. A diet and nutrition specialist (dietitian) can help you to learn healthy eating options. ? Staying at a healthy weight. ? Treating diabetes or high blood pressure, if needed. ? Lowering your stress. Activities such as yoga and relaxation techniques can help. General instructions  Pay attention to any changes in your symptoms. Tell your doctor about them or any new  symptoms.  Avoid any activities that cause chest pain.  Keep all follow-up visits as told by your doctor. This is important. You may need more testing if your chest pain does not go away. Contact a doctor if:  Your chest pain does not go away.  You feel depressed.  You have a fever. Get help right away if:  Your chest pain is worse.  You have a cough that gets worse, or you cough up blood.  You have very bad (severe) pain in your belly (abdomen).  You pass out (faint).  You have either of these for no clear reason: ? Sudden chest discomfort. ? Sudden discomfort in your arms, back, neck, or jaw.  You have shortness of breath at any time.  You suddenly start to sweat, or your skin gets clammy.  You feel sick to your stomach (nauseous).  You throw up (vomit).  You suddenly feel lightheaded or dizzy.  You feel very weak or tired.  Your heart starts to beat fast, or it feels like it is skipping beats. These symptoms may be an emergency. Do not wait to see if the symptoms will go away. Get medical help right away. Call your local emergency services (911 in the [place] Do not drive yourself to the hospital. Summary  Chest pain can be caused by many different conditions. The cause may be serious and need treatment right away. If you have chest pain, see your doctor right away.  Follow your doctor's instructions for taking medicines and making lifestyle changes.  Keep all follow-up visits as told by your doctor. This includes visits for any further testing if your chest pain does not go away.  Be sure to know the signs that show that your condition has become worse. Get help right away if you have these symptoms. This information is not intended to replace advice given to you by your health care provider. Make sure you discuss any questions you have with your health care provider. Document Released: 03/25/2008 Document Revised: 04/09/2018 Document Reviewed:  04/09/2018 Elsevier Interactive Patient Education  2019 Elsevier Inc.  Edema  Edema is when you have too much fluid in your body or under your skin. Edema may make your legs, feet, and ankles swell up. Swelling is also common in looser tissues, like around your eyes. This is a common condition. It gets more common as you get older. There are many possible causes of edema. Eating too much salt (sodium) and being on your feet or sitting for a long time can cause edema in your legs, feet, and ankles. Hot weather may make edema worse. Edema is usually painless. Your skin may look swollen or shiny. Follow these instructions at home:  Keep the swollen body part raised (elevated) above the level of your heart when you  are sitting or lying down.  Do not sit still or stand for a long time.  Do not wear tight clothes. Do not wear garters on your upper legs.  Exercise your legs. This can help the swelling go down.  Wear elastic bandages or support stockings as told by your doctor.  Eat a low-salt (low-sodium) diet to reduce fluid as told by your doctor.  Depending on the cause of your swelling, you may need to limit how much fluid you drink (fluid restriction).  Take over-the-counter and prescription medicines only as told by your doctor. Contact a doctor if:  Treatment is not working.  You have heart, liver, or kidney disease and have symptoms of edema.  You have sudden and unexplained weight gain. Get help right away if:  You have shortness of breath or chest pain.  You cannot breathe when you lie down.  You have pain, redness, or warmth in the swollen areas.  You have heart, liver, or kidney disease and get edema all of a sudden.  You have a fever and your symptoms get worse all of a sudden. Summary  Edema is when you have too much fluid in your body or under your skin.  Edema may make your legs, feet, and ankles swell up. Swelling is also common in looser tissues, like around  your eyes.  Raise (elevate) the swollen body part above the level of your heart when you are sitting or lying down.  Follow your doctor's instructions about diet and how much fluid you can drink (fluid restriction). This information is not intended to replace advice given to you by your health care provider. Make sure you discuss any questions you have with your health care provider. Document Released: 03/25/2008 Document Revised: 10/25/2016 Document Reviewed: 10/25/2016 Elsevier Interactive Patient Education  2019 Elsevier Inc.      Agustina Caroli, MD [facility]

## 2018-10-21 ENCOUNTER — Encounter: Payer: Self-pay | Admitting: Emergency Medicine

## 2018-10-21 DIAGNOSIS — R600 Localized edema: Secondary | ICD-10-CM | POA: Insufficient documentation

## 2018-10-21 NOTE — Assessment & Plan Note (Signed)
Clinically stable.  Normal EKG.  Chest pain reproduced with palpation.  No red flag signs or symptoms.  Chronic pain most likely musculoskeletal.

## 2018-10-21 NOTE — Assessment & Plan Note (Signed)
Chronic.  Today better than usual.

## 2018-10-22 DIAGNOSIS — W19XXXD Unspecified fall, subsequent encounter: Secondary | ICD-10-CM | POA: Diagnosis not present

## 2018-10-22 DIAGNOSIS — M1990 Unspecified osteoarthritis, unspecified site: Secondary | ICD-10-CM | POA: Diagnosis not present

## 2018-10-22 DIAGNOSIS — S22080A Wedge compression fracture of T11-T12 vertebra, initial encounter for closed fracture: Secondary | ICD-10-CM | POA: Diagnosis not present

## 2018-10-22 DIAGNOSIS — Z9181 History of falling: Secondary | ICD-10-CM | POA: Diagnosis not present

## 2018-10-22 DIAGNOSIS — M810 Age-related osteoporosis without current pathological fracture: Secondary | ICD-10-CM | POA: Diagnosis not present

## 2018-10-22 DIAGNOSIS — S2241XD Multiple fractures of ribs, right side, subsequent encounter for fracture with routine healing: Secondary | ICD-10-CM | POA: Diagnosis not present

## 2018-10-22 DIAGNOSIS — I10 Essential (primary) hypertension: Secondary | ICD-10-CM | POA: Diagnosis not present

## 2018-10-22 DIAGNOSIS — J449 Chronic obstructive pulmonary disease, unspecified: Secondary | ICD-10-CM | POA: Diagnosis not present

## 2018-10-22 DIAGNOSIS — G894 Chronic pain syndrome: Secondary | ICD-10-CM | POA: Diagnosis not present

## 2018-10-22 DIAGNOSIS — Z720 Tobacco use: Secondary | ICD-10-CM | POA: Diagnosis not present

## 2018-10-23 DIAGNOSIS — S2241XD Multiple fractures of ribs, right side, subsequent encounter for fracture with routine healing: Secondary | ICD-10-CM | POA: Diagnosis not present

## 2018-10-23 DIAGNOSIS — M1990 Unspecified osteoarthritis, unspecified site: Secondary | ICD-10-CM | POA: Diagnosis not present

## 2018-10-23 DIAGNOSIS — Z9181 History of falling: Secondary | ICD-10-CM | POA: Diagnosis not present

## 2018-10-23 DIAGNOSIS — Z720 Tobacco use: Secondary | ICD-10-CM | POA: Diagnosis not present

## 2018-10-23 DIAGNOSIS — W19XXXD Unspecified fall, subsequent encounter: Secondary | ICD-10-CM | POA: Diagnosis not present

## 2018-10-23 DIAGNOSIS — G894 Chronic pain syndrome: Secondary | ICD-10-CM | POA: Diagnosis not present

## 2018-10-23 DIAGNOSIS — S22080A Wedge compression fracture of T11-T12 vertebra, initial encounter for closed fracture: Secondary | ICD-10-CM | POA: Diagnosis not present

## 2018-10-23 DIAGNOSIS — M810 Age-related osteoporosis without current pathological fracture: Secondary | ICD-10-CM | POA: Diagnosis not present

## 2018-10-23 DIAGNOSIS — J449 Chronic obstructive pulmonary disease, unspecified: Secondary | ICD-10-CM | POA: Diagnosis not present

## 2018-10-23 DIAGNOSIS — I10 Essential (primary) hypertension: Secondary | ICD-10-CM | POA: Diagnosis not present

## 2018-10-27 DIAGNOSIS — J449 Chronic obstructive pulmonary disease, unspecified: Secondary | ICD-10-CM | POA: Diagnosis not present

## 2018-10-27 DIAGNOSIS — M1990 Unspecified osteoarthritis, unspecified site: Secondary | ICD-10-CM | POA: Diagnosis not present

## 2018-10-27 DIAGNOSIS — Z9181 History of falling: Secondary | ICD-10-CM | POA: Diagnosis not present

## 2018-10-27 DIAGNOSIS — W19XXXD Unspecified fall, subsequent encounter: Secondary | ICD-10-CM | POA: Diagnosis not present

## 2018-10-27 DIAGNOSIS — S22080A Wedge compression fracture of T11-T12 vertebra, initial encounter for closed fracture: Secondary | ICD-10-CM | POA: Diagnosis not present

## 2018-10-27 DIAGNOSIS — Z720 Tobacco use: Secondary | ICD-10-CM | POA: Diagnosis not present

## 2018-10-27 DIAGNOSIS — I10 Essential (primary) hypertension: Secondary | ICD-10-CM | POA: Diagnosis not present

## 2018-10-27 DIAGNOSIS — S2241XD Multiple fractures of ribs, right side, subsequent encounter for fracture with routine healing: Secondary | ICD-10-CM | POA: Diagnosis not present

## 2018-10-27 DIAGNOSIS — M810 Age-related osteoporosis without current pathological fracture: Secondary | ICD-10-CM | POA: Diagnosis not present

## 2018-10-27 DIAGNOSIS — G894 Chronic pain syndrome: Secondary | ICD-10-CM | POA: Diagnosis not present

## 2018-10-29 DIAGNOSIS — M1990 Unspecified osteoarthritis, unspecified site: Secondary | ICD-10-CM | POA: Diagnosis not present

## 2018-10-29 DIAGNOSIS — J449 Chronic obstructive pulmonary disease, unspecified: Secondary | ICD-10-CM | POA: Diagnosis not present

## 2018-10-29 DIAGNOSIS — S2241XD Multiple fractures of ribs, right side, subsequent encounter for fracture with routine healing: Secondary | ICD-10-CM | POA: Diagnosis not present

## 2018-10-29 DIAGNOSIS — S22080A Wedge compression fracture of T11-T12 vertebra, initial encounter for closed fracture: Secondary | ICD-10-CM | POA: Diagnosis not present

## 2018-10-29 DIAGNOSIS — M810 Age-related osteoporosis without current pathological fracture: Secondary | ICD-10-CM | POA: Diagnosis not present

## 2018-10-29 DIAGNOSIS — Z720 Tobacco use: Secondary | ICD-10-CM | POA: Diagnosis not present

## 2018-10-29 DIAGNOSIS — W19XXXD Unspecified fall, subsequent encounter: Secondary | ICD-10-CM | POA: Diagnosis not present

## 2018-10-29 DIAGNOSIS — I10 Essential (primary) hypertension: Secondary | ICD-10-CM | POA: Diagnosis not present

## 2018-10-29 DIAGNOSIS — G894 Chronic pain syndrome: Secondary | ICD-10-CM | POA: Diagnosis not present

## 2018-10-29 DIAGNOSIS — Z9181 History of falling: Secondary | ICD-10-CM | POA: Diagnosis not present

## 2018-10-30 DIAGNOSIS — R600 Localized edema: Secondary | ICD-10-CM | POA: Diagnosis not present

## 2018-10-30 DIAGNOSIS — M1990 Unspecified osteoarthritis, unspecified site: Secondary | ICD-10-CM | POA: Diagnosis not present

## 2018-10-30 DIAGNOSIS — W19XXXD Unspecified fall, subsequent encounter: Secondary | ICD-10-CM | POA: Diagnosis not present

## 2018-10-30 DIAGNOSIS — Z720 Tobacco use: Secondary | ICD-10-CM | POA: Diagnosis not present

## 2018-10-30 DIAGNOSIS — S2241XD Multiple fractures of ribs, right side, subsequent encounter for fracture with routine healing: Secondary | ICD-10-CM | POA: Diagnosis not present

## 2018-10-30 DIAGNOSIS — Z9181 History of falling: Secondary | ICD-10-CM | POA: Diagnosis not present

## 2018-10-30 DIAGNOSIS — J449 Chronic obstructive pulmonary disease, unspecified: Secondary | ICD-10-CM | POA: Diagnosis not present

## 2018-10-30 DIAGNOSIS — I10 Essential (primary) hypertension: Secondary | ICD-10-CM | POA: Diagnosis not present

## 2018-10-30 DIAGNOSIS — M810 Age-related osteoporosis without current pathological fracture: Secondary | ICD-10-CM | POA: Diagnosis not present

## 2018-10-30 DIAGNOSIS — M79672 Pain in left foot: Secondary | ICD-10-CM | POA: Diagnosis not present

## 2018-10-30 DIAGNOSIS — G894 Chronic pain syndrome: Secondary | ICD-10-CM | POA: Diagnosis not present

## 2018-10-30 DIAGNOSIS — M25572 Pain in left ankle and joints of left foot: Secondary | ICD-10-CM | POA: Diagnosis not present

## 2018-10-30 DIAGNOSIS — S22080A Wedge compression fracture of T11-T12 vertebra, initial encounter for closed fracture: Secondary | ICD-10-CM | POA: Diagnosis not present

## 2018-11-02 DIAGNOSIS — S22080A Wedge compression fracture of T11-T12 vertebra, initial encounter for closed fracture: Secondary | ICD-10-CM | POA: Diagnosis not present

## 2018-11-02 DIAGNOSIS — S2241XD Multiple fractures of ribs, right side, subsequent encounter for fracture with routine healing: Secondary | ICD-10-CM | POA: Diagnosis not present

## 2018-11-02 DIAGNOSIS — I10 Essential (primary) hypertension: Secondary | ICD-10-CM | POA: Diagnosis not present

## 2018-11-02 DIAGNOSIS — Z9181 History of falling: Secondary | ICD-10-CM | POA: Diagnosis not present

## 2018-11-02 DIAGNOSIS — M810 Age-related osteoporosis without current pathological fracture: Secondary | ICD-10-CM | POA: Diagnosis not present

## 2018-11-02 DIAGNOSIS — J449 Chronic obstructive pulmonary disease, unspecified: Secondary | ICD-10-CM | POA: Diagnosis not present

## 2018-11-02 DIAGNOSIS — M1990 Unspecified osteoarthritis, unspecified site: Secondary | ICD-10-CM | POA: Diagnosis not present

## 2018-11-02 DIAGNOSIS — G894 Chronic pain syndrome: Secondary | ICD-10-CM | POA: Diagnosis not present

## 2018-11-02 DIAGNOSIS — W19XXXD Unspecified fall, subsequent encounter: Secondary | ICD-10-CM | POA: Diagnosis not present

## 2018-11-02 DIAGNOSIS — Z720 Tobacco use: Secondary | ICD-10-CM | POA: Diagnosis not present

## 2018-11-04 ENCOUNTER — Other Ambulatory Visit: Payer: Self-pay | Admitting: Internal Medicine

## 2018-11-04 DIAGNOSIS — Z9181 History of falling: Secondary | ICD-10-CM | POA: Diagnosis not present

## 2018-11-04 DIAGNOSIS — S2241XD Multiple fractures of ribs, right side, subsequent encounter for fracture with routine healing: Secondary | ICD-10-CM | POA: Diagnosis not present

## 2018-11-04 DIAGNOSIS — M1990 Unspecified osteoarthritis, unspecified site: Secondary | ICD-10-CM | POA: Diagnosis not present

## 2018-11-04 DIAGNOSIS — Z720 Tobacco use: Secondary | ICD-10-CM | POA: Diagnosis not present

## 2018-11-04 DIAGNOSIS — M810 Age-related osteoporosis without current pathological fracture: Secondary | ICD-10-CM | POA: Diagnosis not present

## 2018-11-04 DIAGNOSIS — W19XXXD Unspecified fall, subsequent encounter: Secondary | ICD-10-CM | POA: Diagnosis not present

## 2018-11-04 DIAGNOSIS — S22080A Wedge compression fracture of T11-T12 vertebra, initial encounter for closed fracture: Secondary | ICD-10-CM | POA: Diagnosis not present

## 2018-11-04 DIAGNOSIS — I10 Essential (primary) hypertension: Secondary | ICD-10-CM | POA: Diagnosis not present

## 2018-11-04 DIAGNOSIS — J449 Chronic obstructive pulmonary disease, unspecified: Secondary | ICD-10-CM | POA: Diagnosis not present

## 2018-11-04 DIAGNOSIS — G894 Chronic pain syndrome: Secondary | ICD-10-CM | POA: Diagnosis not present

## 2018-11-06 DIAGNOSIS — G894 Chronic pain syndrome: Secondary | ICD-10-CM | POA: Diagnosis not present

## 2018-11-06 DIAGNOSIS — M810 Age-related osteoporosis without current pathological fracture: Secondary | ICD-10-CM | POA: Diagnosis not present

## 2018-11-06 DIAGNOSIS — S2241XD Multiple fractures of ribs, right side, subsequent encounter for fracture with routine healing: Secondary | ICD-10-CM | POA: Diagnosis not present

## 2018-11-06 DIAGNOSIS — J449 Chronic obstructive pulmonary disease, unspecified: Secondary | ICD-10-CM | POA: Diagnosis not present

## 2018-11-06 DIAGNOSIS — I10 Essential (primary) hypertension: Secondary | ICD-10-CM | POA: Diagnosis not present

## 2018-11-06 DIAGNOSIS — Z9181 History of falling: Secondary | ICD-10-CM | POA: Diagnosis not present

## 2018-11-06 DIAGNOSIS — W19XXXD Unspecified fall, subsequent encounter: Secondary | ICD-10-CM | POA: Diagnosis not present

## 2018-11-06 DIAGNOSIS — M1990 Unspecified osteoarthritis, unspecified site: Secondary | ICD-10-CM | POA: Diagnosis not present

## 2018-11-06 DIAGNOSIS — S22080A Wedge compression fracture of T11-T12 vertebra, initial encounter for closed fracture: Secondary | ICD-10-CM | POA: Diagnosis not present

## 2018-11-06 DIAGNOSIS — Z720 Tobacco use: Secondary | ICD-10-CM | POA: Diagnosis not present

## 2018-11-09 DIAGNOSIS — J449 Chronic obstructive pulmonary disease, unspecified: Secondary | ICD-10-CM | POA: Diagnosis not present

## 2018-11-09 DIAGNOSIS — S22080A Wedge compression fracture of T11-T12 vertebra, initial encounter for closed fracture: Secondary | ICD-10-CM | POA: Diagnosis not present

## 2018-11-09 DIAGNOSIS — W19XXXD Unspecified fall, subsequent encounter: Secondary | ICD-10-CM | POA: Diagnosis not present

## 2018-11-09 DIAGNOSIS — M1990 Unspecified osteoarthritis, unspecified site: Secondary | ICD-10-CM | POA: Diagnosis not present

## 2018-11-09 DIAGNOSIS — M810 Age-related osteoporosis without current pathological fracture: Secondary | ICD-10-CM | POA: Diagnosis not present

## 2018-11-09 DIAGNOSIS — Z720 Tobacco use: Secondary | ICD-10-CM | POA: Diagnosis not present

## 2018-11-09 DIAGNOSIS — I10 Essential (primary) hypertension: Secondary | ICD-10-CM | POA: Diagnosis not present

## 2018-11-09 DIAGNOSIS — G894 Chronic pain syndrome: Secondary | ICD-10-CM | POA: Diagnosis not present

## 2018-11-09 DIAGNOSIS — S2241XD Multiple fractures of ribs, right side, subsequent encounter for fracture with routine healing: Secondary | ICD-10-CM | POA: Diagnosis not present

## 2018-11-09 DIAGNOSIS — Z9181 History of falling: Secondary | ICD-10-CM | POA: Diagnosis not present

## 2018-11-11 DIAGNOSIS — G894 Chronic pain syndrome: Secondary | ICD-10-CM | POA: Diagnosis not present

## 2018-11-11 DIAGNOSIS — J449 Chronic obstructive pulmonary disease, unspecified: Secondary | ICD-10-CM | POA: Diagnosis not present

## 2018-11-11 DIAGNOSIS — W19XXXD Unspecified fall, subsequent encounter: Secondary | ICD-10-CM | POA: Diagnosis not present

## 2018-11-11 DIAGNOSIS — M810 Age-related osteoporosis without current pathological fracture: Secondary | ICD-10-CM | POA: Diagnosis not present

## 2018-11-11 DIAGNOSIS — S22080A Wedge compression fracture of T11-T12 vertebra, initial encounter for closed fracture: Secondary | ICD-10-CM | POA: Diagnosis not present

## 2018-11-11 DIAGNOSIS — Z9181 History of falling: Secondary | ICD-10-CM | POA: Diagnosis not present

## 2018-11-11 DIAGNOSIS — S2241XD Multiple fractures of ribs, right side, subsequent encounter for fracture with routine healing: Secondary | ICD-10-CM | POA: Diagnosis not present

## 2018-11-11 DIAGNOSIS — M1990 Unspecified osteoarthritis, unspecified site: Secondary | ICD-10-CM | POA: Diagnosis not present

## 2018-11-11 DIAGNOSIS — Z720 Tobacco use: Secondary | ICD-10-CM | POA: Diagnosis not present

## 2018-11-11 DIAGNOSIS — I10 Essential (primary) hypertension: Secondary | ICD-10-CM | POA: Diagnosis not present

## 2018-11-12 ENCOUNTER — Telehealth: Payer: Self-pay | Admitting: Emergency Medicine

## 2018-11-12 DIAGNOSIS — R3914 Feeling of incomplete bladder emptying: Secondary | ICD-10-CM | POA: Diagnosis not present

## 2018-11-12 DIAGNOSIS — M79672 Pain in left foot: Secondary | ICD-10-CM | POA: Diagnosis not present

## 2018-11-12 DIAGNOSIS — R350 Frequency of micturition: Secondary | ICD-10-CM | POA: Diagnosis not present

## 2018-11-12 NOTE — Telephone Encounter (Unsigned)
Copied from [place] [id number]. Topic: General - Other >> Nov 12, 2018 11:23 AM Carolyn Stare wrote:  Pt was discharged from [facility] all goals were met    [phone]

## 2018-11-12 NOTE — Telephone Encounter (Signed)
FYI

## 2018-11-13 DIAGNOSIS — Z720 Tobacco use: Secondary | ICD-10-CM | POA: Diagnosis not present

## 2018-11-13 DIAGNOSIS — I10 Essential (primary) hypertension: Secondary | ICD-10-CM | POA: Diagnosis not present

## 2018-11-13 DIAGNOSIS — S2241XD Multiple fractures of ribs, right side, subsequent encounter for fracture with routine healing: Secondary | ICD-10-CM | POA: Diagnosis not present

## 2018-11-13 DIAGNOSIS — M1990 Unspecified osteoarthritis, unspecified site: Secondary | ICD-10-CM | POA: Diagnosis not present

## 2018-11-13 DIAGNOSIS — W19XXXD Unspecified fall, subsequent encounter: Secondary | ICD-10-CM | POA: Diagnosis not present

## 2018-11-13 DIAGNOSIS — Z9181 History of falling: Secondary | ICD-10-CM | POA: Diagnosis not present

## 2018-11-13 DIAGNOSIS — S22080A Wedge compression fracture of T11-T12 vertebra, initial encounter for closed fracture: Secondary | ICD-10-CM | POA: Diagnosis not present

## 2018-11-13 DIAGNOSIS — M810 Age-related osteoporosis without current pathological fracture: Secondary | ICD-10-CM | POA: Diagnosis not present

## 2018-11-13 DIAGNOSIS — G894 Chronic pain syndrome: Secondary | ICD-10-CM | POA: Diagnosis not present

## 2018-11-13 DIAGNOSIS — J449 Chronic obstructive pulmonary disease, unspecified: Secondary | ICD-10-CM | POA: Diagnosis not present

## 2018-11-13 NOTE — Telephone Encounter (Signed)
Thanks

## 2018-11-18 DIAGNOSIS — Z720 Tobacco use: Secondary | ICD-10-CM | POA: Diagnosis not present

## 2018-11-18 DIAGNOSIS — G894 Chronic pain syndrome: Secondary | ICD-10-CM | POA: Diagnosis not present

## 2018-11-18 DIAGNOSIS — M1990 Unspecified osteoarthritis, unspecified site: Secondary | ICD-10-CM | POA: Diagnosis not present

## 2018-11-18 DIAGNOSIS — W19XXXD Unspecified fall, subsequent encounter: Secondary | ICD-10-CM | POA: Diagnosis not present

## 2018-11-18 DIAGNOSIS — M810 Age-related osteoporosis without current pathological fracture: Secondary | ICD-10-CM | POA: Diagnosis not present

## 2018-11-18 DIAGNOSIS — S22080A Wedge compression fracture of T11-T12 vertebra, initial encounter for closed fracture: Secondary | ICD-10-CM | POA: Diagnosis not present

## 2018-11-18 DIAGNOSIS — K2970 Gastritis, unspecified, without bleeding: Secondary | ICD-10-CM | POA: Diagnosis not present

## 2018-11-18 DIAGNOSIS — Z9181 History of falling: Secondary | ICD-10-CM | POA: Diagnosis not present

## 2018-11-18 DIAGNOSIS — J449 Chronic obstructive pulmonary disease, unspecified: Secondary | ICD-10-CM | POA: Diagnosis not present

## 2018-11-18 DIAGNOSIS — I10 Essential (primary) hypertension: Secondary | ICD-10-CM | POA: Diagnosis not present

## 2018-11-18 DIAGNOSIS — N3281 Overactive bladder: Secondary | ICD-10-CM | POA: Diagnosis not present

## 2018-11-18 DIAGNOSIS — S2241XD Multiple fractures of ribs, right side, subsequent encounter for fracture with routine healing: Secondary | ICD-10-CM | POA: Diagnosis not present

## 2018-11-19 DIAGNOSIS — Z720 Tobacco use: Secondary | ICD-10-CM | POA: Diagnosis not present

## 2018-11-19 DIAGNOSIS — M1990 Unspecified osteoarthritis, unspecified site: Secondary | ICD-10-CM | POA: Diagnosis not present

## 2018-11-19 DIAGNOSIS — S2241XD Multiple fractures of ribs, right side, subsequent encounter for fracture with routine healing: Secondary | ICD-10-CM | POA: Diagnosis not present

## 2018-11-19 DIAGNOSIS — S22080A Wedge compression fracture of T11-T12 vertebra, initial encounter for closed fracture: Secondary | ICD-10-CM | POA: Diagnosis not present

## 2018-11-19 DIAGNOSIS — J449 Chronic obstructive pulmonary disease, unspecified: Secondary | ICD-10-CM | POA: Diagnosis not present

## 2018-11-19 DIAGNOSIS — I10 Essential (primary) hypertension: Secondary | ICD-10-CM | POA: Diagnosis not present

## 2018-11-19 DIAGNOSIS — Z9181 History of falling: Secondary | ICD-10-CM | POA: Diagnosis not present

## 2018-11-19 DIAGNOSIS — W19XXXD Unspecified fall, subsequent encounter: Secondary | ICD-10-CM | POA: Diagnosis not present

## 2018-11-19 DIAGNOSIS — G894 Chronic pain syndrome: Secondary | ICD-10-CM | POA: Diagnosis not present

## 2018-11-19 DIAGNOSIS — M810 Age-related osteoporosis without current pathological fracture: Secondary | ICD-10-CM | POA: Diagnosis not present

## 2018-11-30 DIAGNOSIS — M25572 Pain in left ankle and joints of left foot: Secondary | ICD-10-CM | POA: Diagnosis not present

## 2018-11-30 DIAGNOSIS — R5383 Other fatigue: Secondary | ICD-10-CM | POA: Diagnosis not present

## 2018-11-30 DIAGNOSIS — R600 Localized edema: Secondary | ICD-10-CM | POA: Diagnosis not present

## 2018-11-30 DIAGNOSIS — I739 Peripheral vascular disease, unspecified: Secondary | ICD-10-CM | POA: Diagnosis not present

## 2018-11-30 DIAGNOSIS — M79672 Pain in left foot: Secondary | ICD-10-CM | POA: Diagnosis not present

## 2018-11-30 DIAGNOSIS — E7800 Pure hypercholesterolemia, unspecified: Secondary | ICD-10-CM | POA: Diagnosis not present

## 2018-12-01 DIAGNOSIS — Q782 Osteopetrosis: Secondary | ICD-10-CM | POA: Insufficient documentation

## 2018-12-02 DIAGNOSIS — I739 Peripheral vascular disease, unspecified: Secondary | ICD-10-CM | POA: Diagnosis not present

## 2018-12-02 DIAGNOSIS — M79605 Pain in left leg: Secondary | ICD-10-CM | POA: Diagnosis not present

## 2018-12-02 DIAGNOSIS — I10 Essential (primary) hypertension: Secondary | ICD-10-CM | POA: Diagnosis not present

## 2018-12-03 ENCOUNTER — Telehealth: Payer: Self-pay | Admitting: *Deleted

## 2018-12-03 NOTE — Telephone Encounter (Signed)
Faxed signed orders to ATTN: [facility]. for home health care. Confirmation page received at 5:13 pm.

## 2018-12-09 DIAGNOSIS — R600 Localized edema: Secondary | ICD-10-CM | POA: Diagnosis not present

## 2018-12-17 ENCOUNTER — Encounter: Payer: Self-pay | Admitting: Emergency Medicine

## 2018-12-17 ENCOUNTER — Encounter (HOSPITAL_COMMUNITY): Payer: Self-pay | Admitting: Internal Medicine

## 2018-12-17 ENCOUNTER — Ambulatory Visit (INDEPENDENT_AMBULATORY_CARE_PROVIDER_SITE_OTHER): Payer: Medicare Other | Admitting: Emergency Medicine

## 2018-12-17 ENCOUNTER — Other Ambulatory Visit: Payer: Self-pay

## 2018-12-17 ENCOUNTER — Inpatient Hospital Stay (HOSPITAL_COMMUNITY)
Admission: EM | Admit: 2018-12-17 | Discharge: 2018-12-19 | DRG: 641 | Disposition: A | Discharge status: Home Health | Payer: Medicare Other | Attending: Internal Medicine | Admitting: Internal Medicine

## 2018-12-17 ENCOUNTER — Emergency Department (HOSPITAL_COMMUNITY): Payer: Medicare Other

## 2018-12-17 ENCOUNTER — Encounter (HOSPITAL_COMMUNITY): Payer: Self-pay | Admitting: Emergency Medicine

## 2018-12-17 VITALS — BP 154/84 | HR 80 | Temp 98.0°F | Resp 16 | Wt 89.2 lb

## 2018-12-17 DIAGNOSIS — R221 Localized swelling, mass and lump, neck: Secondary | ICD-10-CM | POA: Diagnosis present

## 2018-12-17 DIAGNOSIS — Z720 Tobacco use: Secondary | ICD-10-CM | POA: Diagnosis not present

## 2018-12-17 DIAGNOSIS — I129 Hypertensive chronic kidney disease with stage 1 through stage 4 chronic kidney disease, or unspecified chronic kidney disease: Secondary | ICD-10-CM

## 2018-12-17 DIAGNOSIS — S79912A Unspecified injury of left hip, initial encounter: Secondary | ICD-10-CM | POA: Diagnosis not present

## 2018-12-17 DIAGNOSIS — E871 Hypo-osmolality and hyponatremia: Secondary | ICD-10-CM | POA: Diagnosis not present

## 2018-12-17 DIAGNOSIS — T8619 Other complication of kidney transplant: Secondary | ICD-10-CM

## 2018-12-17 DIAGNOSIS — E860 Dehydration: Secondary | ICD-10-CM | POA: Diagnosis not present

## 2018-12-17 DIAGNOSIS — R296 Repeated falls: Secondary | ICD-10-CM

## 2018-12-17 DIAGNOSIS — S22080A Wedge compression fracture of T11-T12 vertebra, initial encounter for closed fracture: Secondary | ICD-10-CM | POA: Diagnosis not present

## 2018-12-17 DIAGNOSIS — J449 Chronic obstructive pulmonary disease, unspecified: Secondary | ICD-10-CM | POA: Diagnosis present

## 2018-12-17 DIAGNOSIS — M545 Low back pain: Secondary | ICD-10-CM | POA: Diagnosis not present

## 2018-12-17 DIAGNOSIS — S199XXA Unspecified injury of neck, initial encounter: Secondary | ICD-10-CM | POA: Diagnosis not present

## 2018-12-17 DIAGNOSIS — E861 Hypovolemia: Secondary | ICD-10-CM | POA: Diagnosis not present

## 2018-12-17 DIAGNOSIS — D638 Anemia in other chronic diseases classified elsewhere: Secondary | ICD-10-CM | POA: Diagnosis not present

## 2018-12-17 DIAGNOSIS — Z79899 Other long term (current) drug therapy: Secondary | ICD-10-CM

## 2018-12-17 DIAGNOSIS — S0003XA Contusion of scalp, initial encounter: Secondary | ICD-10-CM

## 2018-12-17 DIAGNOSIS — S0990XA Unspecified injury of head, initial encounter: Secondary | ICD-10-CM | POA: Diagnosis not present

## 2018-12-17 DIAGNOSIS — I10 Essential (primary) hypertension: Secondary | ICD-10-CM | POA: Diagnosis not present

## 2018-12-17 DIAGNOSIS — Z9981 Dependence on supplemental oxygen: Secondary | ICD-10-CM

## 2018-12-17 DIAGNOSIS — F17210 Nicotine dependence, cigarettes, uncomplicated: Secondary | ICD-10-CM | POA: Diagnosis present

## 2018-12-17 DIAGNOSIS — K219 Gastro-esophageal reflux disease without esophagitis: Secondary | ICD-10-CM | POA: Diagnosis not present

## 2018-12-17 DIAGNOSIS — S79911A Unspecified injury of right hip, initial encounter: Secondary | ICD-10-CM | POA: Diagnosis not present

## 2018-12-17 DIAGNOSIS — Y92009 Unspecified place in unspecified non-institutional (private) residence as the place of occurrence of the external cause: Secondary | ICD-10-CM

## 2018-12-17 DIAGNOSIS — N19 Unspecified kidney failure: Secondary | ICD-10-CM | POA: Diagnosis present

## 2018-12-17 DIAGNOSIS — R7989 Other specified abnormal findings of blood chemistry: Secondary | ICD-10-CM

## 2018-12-17 DIAGNOSIS — W010XXA Fall on same level from slipping, tripping and stumbling without subsequent striking against object, initial encounter: Secondary | ICD-10-CM | POA: Diagnosis present

## 2018-12-17 DIAGNOSIS — W19XXXA Unspecified fall, initial encounter: Secondary | ICD-10-CM

## 2018-12-17 HISTORY — DX: Gastro-esophageal reflux disease without esophagitis: K21.9

## 2018-12-17 HISTORY — DX: Anemia in other chronic diseases classified elsewhere: D63.8

## 2018-12-17 HISTORY — DX: Unspecified urinary incontinence: R32

## 2018-12-17 LAB — CBC WITH DIFFERENTIAL/PLATELET
ABS IMMATURE GRANULOCYTES: 0.02 10*3/uL (ref 0.00–0.07)
Basophils Absolute: 0 10*3/uL (ref 0.0–0.1)
Basophils Relative: 0 %
Eosinophils Absolute: 0.2 10*3/uL (ref 0.0–0.5)
Eosinophils Relative: 2 %
HCT: 26.7 % — ABNORMAL LOW (ref 36.0–46.0)
Hemoglobin: 9 g/dL — ABNORMAL LOW (ref 12.0–15.0)
IMMATURE GRANULOCYTES: 0 %
Lymphocytes Relative: 24 %
Lymphs Abs: 2 10*3/uL (ref 0.7–4.0)
MCH: 32 pg (ref 26.0–34.0)
MCHC: 33.7 g/dL (ref 30.0–36.0)
MCV: 95 fL (ref 80.0–100.0)
Monocytes Absolute: 0.7 10*3/uL (ref 0.1–1.0)
Monocytes Relative: 9 %
NEUTROS ABS: 5.2 10*3/uL (ref 1.7–7.7)
Neutrophils Relative %: 65 %
Platelets: 218 10*3/uL (ref 150–400)
RBC: 2.81 MIL/uL — ABNORMAL LOW (ref 3.87–5.11)
RDW: 12.9 % (ref 11.5–15.5)
WBC: 8.1 10*3/uL (ref 4.0–10.5)
nRBC: 0 % (ref 0.0–0.2)

## 2018-12-17 LAB — URINALYSIS, ROUTINE W REFLEX MICROSCOPIC
BILIRUBIN URINE: NEGATIVE
Glucose, UA: NEGATIVE mg/dL
Hgb urine dipstick: NEGATIVE
KETONES UR: NEGATIVE mg/dL
Leukocytes,Ua: NEGATIVE
Nitrite: NEGATIVE
PH: 7 (ref 5.0–8.0)
Protein, ur: NEGATIVE mg/dL
Specific Gravity, Urine: 1.005 (ref 1.005–1.030)

## 2018-12-17 LAB — BASIC METABOLIC PANEL
Anion gap: 8 (ref 5–15)
BUN: 21 mg/dL (ref 8–23)
CO2: 23 mmol/L (ref 22–32)
Calcium: 8.9 mg/dL (ref 8.9–10.3)
Chloride: 92 mmol/L — ABNORMAL LOW (ref 98–111)
Creatinine, Ser: 0.77 mg/dL (ref 0.44–1.00)
GFR calc Af Amer: >60 mL/min (ref >60)
GFR calc non Af Amer: >60 mL/min (ref >60)
Glucose, Bld: 93 mg/dL (ref 70–99)
POTASSIUM: 4.4 mmol/L (ref 3.5–5.1)
Sodium: 123 mmol/L — ABNORMAL LOW (ref 135–145)

## 2018-12-17 MED ORDER — SODIUM CHLORIDE 0.9 % IV BOLUS
500.0000 mL | Freq: Once | INTRAVENOUS | Status: AC
  Administered 2018-12-17: 500 mL via INTRAVENOUS

## 2018-12-17 MED ORDER — ONDANSETRON HCL 4 MG/2ML IJ SOLN: 4.0000 mg | Freq: Four times a day (QID) | INTRAMUSCULAR | Status: DC | PRN

## 2018-12-17 MED ORDER — LISINOPRIL 40 MG PO TABS: 40.0000 mg | ORAL_TABLET | Freq: Every day | ORAL | 3 refills | Status: DC

## 2018-12-17 MED ORDER — ACETAMINOPHEN 650 MG RE SUPP: 650.0000 mg | Freq: Four times a day (QID) | RECTAL | Status: DC | PRN

## 2018-12-17 MED ORDER — ALBUTEROL SULFATE (2.5 MG/3ML) 0.083% IN NEBU: 2.5000 mg | INHALATION_SOLUTION | RESPIRATORY_TRACT | Status: DC | PRN

## 2018-12-17 MED ORDER — ACETAMINOPHEN 325 MG PO TABS
650.0000 mg | ORAL_TABLET | Freq: Four times a day (QID) | ORAL | Status: DC | PRN
  Administered 2018-12-17 – 2018-12-19 (×5): 650 mg via ORAL
  Filled 2018-12-17 (×5): qty 2

## 2018-12-17 MED ORDER — PANTOPRAZOLE SODIUM 40 MG PO TBEC
40.0000 mg | DELAYED_RELEASE_TABLET | Freq: Every day | ORAL | Status: DC
  Administered 2018-12-18 – 2018-12-19 (×2): 40 mg via ORAL
  Filled 2018-12-17 (×2): qty 1

## 2018-12-17 MED ORDER — NICOTINE 14 MG/24HR TD PT24: 14.0000 mg | MEDICATED_PATCH | Freq: Every day | TRANSDERMAL | Status: DC | PRN

## 2018-12-17 MED ORDER — SODIUM CHLORIDE 0.9 % IV SOLN
INTRAVENOUS | Status: DC
  Administered 2018-12-17 – 2018-12-19 (×2): via INTRAVENOUS

## 2018-12-17 MED ORDER — ONDANSETRON HCL 4 MG PO TABS: 4.0000 mg | ORAL_TABLET | Freq: Four times a day (QID) | ORAL | Status: DC | PRN

## 2018-12-17 MED ORDER — LISINOPRIL 20 MG PO TABS
40.0000 mg | ORAL_TABLET | Freq: Every day | ORAL | Status: DC
  Administered 2018-12-18 – 2018-12-19 (×2): 40 mg via ORAL
  Filled 2018-12-17 (×2): qty 2

## 2018-12-17 NOTE — Progress Notes (Signed)
BP Readings from Last 3 Encounters:  10/20/18 (!) 174/68  09/28/18 129/69  09/18/18 120/70   Barbara Dennis 85 y.o.   Chief Complaint  Patient presents with  . Back Pain     since last October and fell 3 times hit head  . Medication Refill    HISTORY OF PRESENT ILLNESS: This is a 85 y.o. female brought in by daughter for follow-up.  Concerned about blood pressure.  States systolic is usually [id number] at home.  [employer] interpreter used during the interview.  During the past week patient has fallen 3 times despite using a walker at home and struck her head several times without loss of consciousness.  Has scalp hematoma on the right side.  Also complaining of chronic back pain.  No nausea or vomiting.  Daughter states mother has been acting normal.  No other associated symptoms.  HPI   Prior to Admission medications   Medication Sig Start Date End Date Taking? Authorizing Provider  acetaminophen (TYLENOL) 500 MG tablet Take 2 tablets (1,000 mg total) by mouth every 8 (eight) hours. 07/23/18  Yes Simaan, Darci Current, PA-C  bismuth subsalicylate (PEPTO BISMOL) 262 MG chewable tablet Chew 2 tablets (524 mg total) by mouth as needed. 09/18/18  Yes Hennie Duos, MD  calcium elemental as carbonate (TUMS ULTRA 1000) 400 MG chewable tablet Chew 3 tablets (1,200 mg total) by mouth 2 times daily at 12 noon and 4 pm. 09/18/18  Yes Hennie Duos, MD  diclofenac sodium (VOLTAREN) 1 % GEL Apply 2 g topically 4 (four) times daily. 09/18/18  Yes Hennie Duos, MD  lisinopril (PRINIVIL,ZESTRIL) 20 MG tablet Take 1 tablet (20 mg total) by mouth daily. 09/18/18  Yes Hennie Duos, MD  mirabegron ER (MYRBETRIQ) 25 MG TB24 tablet Take 1 tablet (25 mg total) by mouth daily. 09/18/18  Yes Hennie Duos, MD  Multiple Vitamin (MULTIVITAMIN WITH MINERALS) TABS tablet Take 1 tablet by mouth daily. 09/18/18  Yes Hennie Duos, MD  omeprazole (PRILOSEC) 20 MG capsule TAKE 2 CAPSULES BY MOUTH  TWICE DAILY BEFORE A MEAL 09/18/18  Yes Hennie Duos, MD  oxybutynin (DITROPAN-XL) 5 MG 24 hr tablet Take 5 mg by mouth at bedtime.   Yes [provider]  albuterol (PROVENTIL HFA;VENTOLIN HFA) 108 (90 Base) MCG/ACT inhaler Inhale 2 puffs into the lungs every 4 (four) hours as needed for wheezing or shortness of breath. Patient not taking: Reported on 12/17/2018 09/18/18   Hennie Duos, MD  docusate sodium (COLACE) 100 MG capsule Take 1 capsule (100 mg total) by mouth 2 (two) times daily. Patient not taking: Reported on 12/17/2018 09/18/18   Hennie Duos, MD  Glucos-Chond-Hyal Ac-Ca Fructo (MOVE FREE JOINT HEALTH ADVANCE) TABS Take 1 tablet by mouth daily. Patient not taking: Reported on 12/17/2018 09/18/18   Hennie Duos, MD  ondansetron (ZOFRAN-ODT) 4 MG disintegrating tablet Take 1 tablet (4 mg total) by mouth every 6 (six) hours as needed for nausea. Patient not taking: Reported on 12/17/2018 09/18/18   Hennie Duos, MD  polyethylene glycol powder [facility]) powder Take 17 g by mouth 2 (two) times daily as needed for moderate constipation. Patient not taking: Reported on 12/17/2018 09/18/18   Hennie Duos, MD  traMADol (ULTRAM) 50 MG tablet Take 1 tablet (50 mg total) by mouth every 8 (eight) hours as needed. Patient not taking: Reported on 12/17/2018 10/20/18   Horald Pollen, MD    No Known Allergies  Patient Active Problem List   Diagnosis Date Noted  . Pedal edema 10/21/2018  . Osteoporosis 09/12/2018  . Overactive bladder 08/29/2018  . O2 dependent 08/11/2018  . Hyponatremia 08/02/2018  . Gastroesophageal reflux disease 07/16/2018  . Chronic pain syndrome 01/07/2018  . Chronic obstructive pulmonary disease ([place]) 08/20/2017  . Multinodular goiter 08/15/2017  . Bilateral leg pain 07/15/2017  . Chronic bilateral low back pain without sciatica 07/15/2017  . Essential hypertension, benign 07/15/2017  . Hypercalcemia 07/15/2017  .  Depression with anxiety 01/06/2015  . 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 DJD (degenerative joint disease) 02/16/2013  . Tobacco user 09/23/2012  . Menopause 08/21/2012  . Postmenopausal atrophic vaginitis 08/21/2012    Past Medical History:  Diagnosis Date  . Hypertension   . Mass of right side of neck   . Oxygen dependent     Past Surgical History:  Procedure Laterality Date  . APPENDECTOMY    . BIOPSY  08/24/2018   Procedure: BIOPSY;  Surgeon: Thornton Park, MD;  Location: [facility];  Service: Gastroenterology;;  . ESOPHAGOGASTRODUODENOSCOPY (EGD) WITH PROPOFOL N/A 08/24/2018   Procedure: ESOPHAGOGASTRODUODENOSCOPY (EGD) WITH PROPOFOL;  Surgeon: Thornton Park, MD;  Location: [facility];  Service: Gastroenterology;  Laterality: N/A;    Social History   Socioeconomic History  . Marital status: Widowed    Spouse name: Not on file  . Number of children: Not on file  . Years of education: Not on file  . Highest education level: Not on file  Occupational History  . Not on file  Social Needs  . Financial resource strain: Not on file  . Food insecurity:    Worry: Not on file    Inability: Not on file  . Transportation needs:    Medical: Not on file    Non-medical: Not on file  Tobacco Use  . Smoking status: Current Every Day Smoker    Packs/day: 0.70    Years: 60.00    Pack years: 42.00    Types: Cigarettes  . Smokeless tobacco: Never Used  Substance and Sexual Activity  . Alcohol use: No    Alcohol/week: 0.0 standard drinks  . Drug use: No  . Sexual activity: Not on file  Lifestyle  . Physical activity:    Days per week: Not on file    Minutes per session: Not on file  . Stress: Not on file  Relationships  . Social connections:    Talks on phone: Not on file    Gets together: Not on file    Attends religious service: Not on file    Active member of club or organization: Not on file    Attends meetings of clubs or organizations: Not on  file    Relationship status: Not on file  . Intimate partner violence:    Fear of current or ex partner: Not on file    Emotionally abused: Not on file    Physically abused: Not on file    Forced sexual activity: Not on file  Other Topics Concern  . Not on file  Social History Narrative   Marital status: widowed 28 years ago.  From [place]; moved to [place] 1974.     Children: none       Lives:  Alone; brother in law is Psychologist, sport and exercise who is 1.      Employment: retired.       Tobacco:  1/2 ppd       Alcohol:  None      Exercise:  Walking daily.      ADLs: no driving.    Family History  Problem Relation Age of Onset  . Hypercalcemia Neg Hx   . Osteoporosis Neg Hx      Review of Systems  Constitutional: Negative.  Negative for chills and fever.  HENT: Negative.  Negative for nosebleeds.   Eyes: Negative for blurred vision and double vision.  Respiratory: Negative.  Negative for shortness of breath.   Cardiovascular: Negative for chest pain and palpitations.  Gastrointestinal: Negative for nausea and vomiting.  Musculoskeletal: Positive for back pain.  Skin: Negative.  Negative for rash.  Neurological: Negative for dizziness and headaches.  All other systems reviewed and are negative.  Vitals:   12/17/18 1357  BP: (!) 154/84  Pulse: 80  Resp: 16  Temp: 98 F (36.7 C)  SpO2: 97%     Physical Exam Vitals signs reviewed.  HENT:     Head:     Comments: Right-sided large scalp hematoma with bruising extending down to right side of the neck and behind the ear    Nose: Nose normal.     Mouth/Throat:     Mouth: Mucous membranes are moist.     Pharynx: Oropharynx is clear.  Eyes:     Extraocular Movements: Extraocular movements intact.     Conjunctiva/sclera: Conjunctivae normal.     Pupils: Pupils are equal, round, and reactive to light.  Neck:     Musculoskeletal: Normal range of motion and neck supple.  Cardiovascular:     Rate and Rhythm: Normal rate and regular rhythm.       Heart sounds: Normal heart sounds.  Pulmonary:     Effort: Pulmonary effort is normal.     Breath sounds: Normal breath sounds.  Skin:    General: Skin is warm and dry.  Neurological:     General: No focal deficit present.     Mental Status: She is alert and oriented to person, place, and time.     Sensory: No sensory deficit.     Motor: No weakness.  Psychiatric:        Mood and Affect: Mood normal.        ASSESSMENT & PLAN: Barbara Dennis was seen today for back pain and medication refill.  Diagnoses and all orders for this visit:  Injury of head, initial encounter  Multiple falls  Hematoma of scalp, initial encounter  Other orders -     lisinopril (PRINIVIL,ZESTRIL) 40 MG tablet; Take 1 tablet (40 mg total) by mouth daily.    Patient Instructions   [facility] Emergency Dept [place].  Phone number [phone]  Go to the emergency room now for further evaluation and diagnostic imaging. Head Injury, Adult There are many types of head injuries. They can be as minor as a bump. Some head injuries can be worse. Worse injuries include:  A strong hit to the head that hurts the brain (concussion).  A bruise of the brain (contusion). This means there is bleeding in the brain that can cause swelling.  A cracked skull (skull fracture).  Bleeding in the brain that gathers, gets thick (makes a clot), and forms a bump (hematoma). Most problems from a head injury come in the first 24 hours. However, you may still have side effects up to 7-10 days after your injury. It is important to watch your condition for any changes. Follow these instructions at home: Activity  Rest as much as possible.  Avoid activities that are  hard or tiring.  Make sure you get enough sleep.  Limit activities that need a lot of thought or attention, such as: ? Watching TV. ? Playing memory games and puzzles. ? Job-related work or homework. ? Working on [employer], and texting.  Avoid activities that could cause another head injury until your doctor says it is okay. This includes playing sports.  Ask your doctor when it is safe for you to go back to your normal activities, such as work or school. Ask your doctor for a step-by-step plan for slowly going back to your normal activities.  Ask your doctor when you can drive, ride a bicycle, or use heavy machinery. Never do these activities if you are dizzy. Lifestyle  Do not drink alcohol until your doctor says it is okay.  Avoid drug use.  If it is harder than usual to remember things, write them down.  If you are easily distracted, try to do one thing at a time.  Talk with family members or close friends when making important decisions.  Tell your friends, family, a trusted coworker, and work Freight forwarder about your injury, symptoms, and limits (restrictions). Have them watch for any problems that are new or getting worse. General instructions  Take over-the-counter and prescription medicines only as told by your doctor.  Have someone stay with you for 24 hours after your head injury. This person should watch you for any changes in your symptoms and be ready to get help.  Keep all follow-up visits as told by your doctor. This is important. How is this prevented?  Work on Astron[employer]. This can help you avoid falls.  Wear a seatbelt when you are in a moving vehicle.  Wear a helmet when: ? Riding a bicycle. ? Skiing. ? Doing any other sport or activity that has a risk of injury.  Drink alcohol only in moderation.  Make your home safer by: ? Getting rid of clutter from the floors and stairs, like things that can make you trip. ? Using grab bars in bathrooms and handrails by stairs. ? Placing non-slip mats on floors and in bathtubs. ? Putting more light in dim areas. Get help right away if:  You have: ? A very bad (severe) headache that is not helped by  medicine. ? Trouble walking or weakness in your arms and legs. ? Clear or bloody fluid coming from your nose or ears. ? Changes in your seeing (vision). ? Jerky movements that you cannot control (seizure).  You throw up (vomit).  Your symptoms get worse.  You lose balance.  Your speech is slurred.  You pass out.  You are sleepier and have trouble staying awake.  The black centers of your eyes (pupils) change in size. These symptoms may be an emergency. Do not wait to see if the symptoms will go away. Get medical help right away. Call your local emergency services (911 in the [place] Do not drive yourself to the hospital. This information is not intended to replace advice given to you by your health care provider. Make sure you discuss any questions you have with your health care provider. Document Released: 09/19/2008 Document Revised: 07/01/2018 Document Reviewed: 04/16/2016 Elsevier Interactive Patient Education  [employer].    If you have lab work done today you will be contacted with your lab results within the next 2 weeks.  If you have not heard from [place] then please contact us. The fastest way to get  your results is to register for My Chart.   IF you received an x-ray today, you will receive an invoice from [facility]. Please contact [facility] at [phone] with questions or concerns regarding your invoice.   IF you received labwork today, you will receive an invoice from [facility]. Please contact [facility] at [phone] with questions or concerns regarding your invoice.   Our billing staff will not be able to assist you with questions regarding bills from these companies.  You will be contacted with the lab results as soon as they are available. The fastest way to get your results is to activate your My Chart account. Instructions are located on the last page of this paperwork. If you have not heard from [place] regarding the results in 2 weeks,  please contact this office.         Agustina Caroli, MD [facility]

## 2018-12-17 NOTE — ED Notes (Signed)
ED TO INPATIENT HANDOFF REPORT  Name/Age/Gender Barbara Dennis 85 y.o. female  Code Status Code Status History    Date Active Date Inactive Code Status Order ID Comments User Context   07/19/2018 1956 07/23/2018 2102 Full Code [id number]  Erroll Luna, MD ED      Home/SNF/Other Home  Chief Complaint fall   Level of Care/Admitting Diagnosis ED Disposition    ED Disposition Condition [facility] Area: [facility] [[id number]]  Level of Care: Med-Surg [16]  Diagnosis: Hyponatremia [956387]  Admitting Physician: Rhetta Mura [[id number]]  Attending Physician: Rhetta Mura [[id number]]  PT Class (Do Not Modify): Observation [104]  PT Acc Code (Do Not Modify): Observation [10022]       Medical History Past Medical History:  Diagnosis Date  . GERD (gastroesophageal reflux disease)   . Hypertension   . Mass of right side of neck   . Oxygen dependent   . Urinary incontinence     Allergies No Known Allergies  IV Location/Drains/Wounds Patient Lines/Drains/Airways Status   Active Line/Drains/Airways    Name:   Placement date:   Placement time:   Site:   Days:   Peripheral IV 12/17/18 Right Forearm   12/17/18    2037    Forearm   less than 1          Labs/Imaging Results for orders placed or performed during the hospital encounter of 12/17/18 (from the past 48 hour(s))  Basic metabolic panel     Status: Abnormal   Collection Time: 12/17/18  7:01 PM  Result Value Ref Range   Sodium 123 (L) 135 - 145 mmol/L   Potassium 4.4 3.5 - 5.1 mmol/L   Chloride 92 (L) 98 - 111 mmol/L   CO2 23 22 - 32 mmol/L   Glucose, Bld 93 70 - 99 mg/dL   BUN 21 8 - 23 mg/dL   Creatinine, Ser 0.77 0.44 - 1.00 mg/dL   Calcium 8.9 8.9 - 10.3 mg/dL   GFR calc non Af Amer >60 >60 mL/min   GFR calc Af Amer >60 >60 mL/min   Anion gap 8 5 - 15    Comment: Performed at [facility], [place] [address] [place], [place] [address]  CBC with  Differential/Platelet     Status: Abnormal   Collection Time: 12/17/18  7:01 PM  Result Value Ref Range   WBC 8.1 4.0 - 10.5 K/uL   RBC 2.81 (L) 3.87 - 5.11 MIL/uL   Hemoglobin 9.0 (L) 12.0 - 15.0 g/dL   HCT 26.7 (L) 36.0 - 46.0 %   MCV 95.0 80.0 - 100.0 fL   MCH 32.0 26.0 - 34.0 pg   MCHC 33.7 30.0 - 36.0 g/dL   RDW 12.9 11.5 - 15.5 %   Platelets 218 150 - 400 K/uL   nRBC 0.0 0.0 - 0.2 %   Neutrophils Relative % 65 %   Neutro Abs 5.2 1.7 - 7.7 K/uL   Lymphocytes Relative 24 %   Lymphs Abs 2.0 0.7 - 4.0 K/uL   Monocytes Relative 9 %   Monocytes Absolute 0.7 0.1 - 1.0 K/uL   Eosinophils Relative 2 %   Eosinophils Absolute 0.2 0.0 - 0.5 K/uL   Basophils Relative 0 %   Basophils Absolute 0.0 0.0 - 0.1 K/uL   Immature Granulocytes 0 %   Abs Immature Granulocytes 0.02 0.00 - 0.07 K/uL    Comment: Performed at [facility], [place]  [address] [place], [place] [address]   Dg Lumbar Spine Complete  Result Date: 12/17/2018 CLINICAL DATA:  Pt sent from PCP for fall yesterday and hit head. Pt also fell on Thursday and Friday last week. Pt denies LOC or taking any blood thinners. Pt having head and middle to lower back pains. Pt reports little nausea but no alterations from her baseline EXAM: LUMBAR SPINE - COMPLETE 4+ VIEW COMPARISON:  CT abdomen pelvis, 07/19/2018 FINDINGS: Previously noted fracture T12 has increased in severity, now severe, with compression primarily of the central and anterior aspect of the vertebral body. No other fractures.  No spondylolisthesis.  No bone lesions. Skeletal structures are diffusely demineralized. Moderate loss of disc height with mild endplate sclerosis at D3-U2. Mild loss of disc height at L3-L4 and L4-L5. There are scattered atherosclerotic calcifications along the abdominal aorta and iliac arteries. Mild focal bulge of the infrarenal abdominal aorta, 2.6 cm. These findings are stable from the prior CT. IMPRESSION: 1. Increase in severity of  the compression fracture of T12 when compared to the prior CT. This could be recent. 2. No other fractures. 3. Degenerative changes as described stable from the prior CT. Electronically Signed   By: Lajean Manes M.D.   On: 12/17/2018 18:26   Ct Head Wo Contrast  Result Date: 12/17/2018 CLINICAL DATA:  85 y/o F; fall yesterday with head injury. Head and back pain. EXAM: CT HEAD WITHOUT CONTRAST CT CERVICAL SPINE WITHOUT CONTRAST TECHNIQUE: Multidetector CT imaging of the head and cervical spine was performed following the standard protocol without intravenous contrast. Multiplanar CT image reconstructions of the cervical spine were also generated. COMPARISON:  08/18/2018 CT head and cervical spine. FINDINGS: CT HEAD FINDINGS Brain: No evidence of acute infarction, hemorrhage, hydrocephalus, extra-axial collection or mass lesion/mass effect. Stable small chronic infarctions within the bilateral anterior basal ganglia. Stable chronic microvascular ischemic changes and volume loss of the brain. Stable small prominent extra-axial space in the left parafalcine frontal region, likely a small underlying arachnoid cyst. Vascular: Calcific atherosclerosis of the carotid siphons and vertebral arteries. No hyperdense vessel identified. Skull: Large right parietal region scalp contusion with hematoma. Sinuses/Orbits: No acute finding. Other: None. CT CERVICAL SPINE FINDINGS Alignment: C5-6 grade 1 retrolisthesis is stable. Skull base and vertebrae: No acute fracture. No primary bone lesion or focal pathologic process. Soft tissues and spinal canal: Right neck mass deep to the parotid tail and posterior to right submandibular gland is increased in size measuring 3.6 x 2.2 cm (AP by ML series 9, image 37). Left deep parotid mass is increased in size measuring 24 x 20 mm (AP by ML series 9, image 21). Calcific atherosclerosis of the carotid bifurcations and the aortic arch. Disc levels: Cervical spondylosis with prominent  discogenic degenerative changes at the C3-4 and C5-6 levels resulting in approximately moderate spinal canal stenosis. Additionally, uncovertebral and facet hypertrophy encroaches on the bilateral neural foramen at multiple levels greatest at the left C4-5 and bilateral C5-6 levels. Upper chest: Negative. Other: Negative. IMPRESSION: CT head: 1. Large right parietal region scalp contusion with hematoma. No calvarial fracture. 2. No acute intracranial abnormality identified. 3. Stable chronic microvascular ischemic changes and volume loss of the brain. 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 CT cervical spine: 1. No acute fracture or dislocation identified. 2. Mild interval increase in size of right neck mass posterior to right submandibular gland and left deep parotid gland mass. Surgical consultation and/or tissue sampling is recommended if not already  acquired. 3. Stable cervical spondylosis greatest at the C3-4 and C5-6 levels. Electronically Signed   By: Kristine Garbe M.D.   On: 12/17/2018 18:51   Ct Cervical Spine Wo Contrast  Result Date: 12/17/2018 CLINICAL DATA:  85 y/o F; fall yesterday with head injury. Head and back pain. EXAM: CT HEAD WITHOUT CONTRAST CT CERVICAL SPINE WITHOUT CONTRAST TECHNIQUE: Multidetector CT imaging of the head and cervical spine was performed following the standard protocol without intravenous contrast. Multiplanar CT image reconstructions of the cervical spine were also generated. COMPARISON:  08/18/2018 CT head and cervical spine. FINDINGS: CT HEAD FINDINGS Brain: No evidence of acute infarction, hemorrhage, hydrocephalus, extra-axial collection or mass lesion/mass effect. Stable small chronic infarctions within the bilateral anterior basal ganglia. Stable chronic microvascular ischemic changes and volume loss of the brain. Stable small prominent extra-axial space in the left parafalcine frontal region, likely a small  underlying arachnoid cyst. Vascular: Calcific atherosclerosis of the carotid siphons and vertebral arteries. No hyperdense vessel identified. Skull: Large right parietal region scalp contusion with hematoma. Sinuses/Orbits: No acute finding. Other: None. CT CERVICAL SPINE FINDINGS Alignment: C5-6 grade 1 retrolisthesis is stable. Skull base and vertebrae: No acute fracture. No primary bone lesion or focal pathologic process. Soft tissues and spinal canal: Right neck mass deep to the parotid tail and posterior to right submandibular gland is increased in size measuring 3.6 x 2.2 cm (AP by ML series 9, image 37). Left deep parotid mass is increased in size measuring 24 x 20 mm (AP by ML series 9, image 21). Calcific atherosclerosis of the carotid bifurcations and the aortic arch. Disc levels: Cervical spondylosis with prominent discogenic degenerative changes at the C3-4 and C5-6 levels resulting in approximately moderate spinal canal stenosis. Additionally, uncovertebral and facet hypertrophy encroaches on the bilateral neural foramen at multiple levels greatest at the left C4-5 and bilateral C5-6 levels. Upper chest: Negative. Other: Negative. IMPRESSION: CT head: 1. Large right parietal region scalp contusion with hematoma. No calvarial fracture. 2. No acute intracranial abnormality identified. 3. Stable chronic microvascular ischemic changes and volume loss of the brain. 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 CT cervical spine: 1. No acute fracture or dislocation identified. 2. Mild interval increase in size of right neck mass posterior to right submandibular gland and left deep parotid gland mass. Surgical consultation and/or tissue sampling is recommended if not already acquired. 3. Stable cervical spondylosis greatest at the C3-4 and C5-6 levels. Electronically Signed   By: Kristine Garbe M.D.   On: 12/17/2018 18:51   Dg Hips Bilat W Or Wo Pelvis 3-4  Views  Result Date: 12/17/2018 CLINICAL DATA:  Fall. EXAM: DG HIP (WITH OR WITHOUT PELVIS) 3-4V BILAT COMPARISON:  None. FINDINGS: Bones are markedly demineralized. There is abdominal aortic atherosclerosis. SI joints and symphysis pubis unremarkable. No evidence for superior or inferior pubic ramus fracture. AP and frog-leg lateral views of the left hip show no femoral neck fracture. AP and frog-leg lateral views of the right hip show no femoral neck fracture. IMPRESSION: Negative. Electronically Signed   By: Misty Stanley M.D.   On: 12/17/2018 18:25   None  Pending Labs Unresulted Labs (From admission, onward)   None      Vitals/Pain Today's Vitals   12/17/18 1901 12/17/18 1930 12/17/18 2000 12/17/18 2030  BP: (!) 168/70 (!) 158/67 (!) 151/66 (!) 163/68  Pulse: 81 78 78 76  Resp: 17 19 18 17   Temp:      TempSrc:  SpO2: 96% 93% 95% 98%  PainSc:        Isolation Precautions No active isolations  Medications Medications  0.9 %  sodium chloride infusion ( Intravenous New Bag/Given 12/17/18 2039)  sodium chloride 0.9 % bolus 500 mL (0 mLs Intravenous Stopped 12/17/18 2143)    Mobility walks

## 2018-12-17 NOTE — ED Triage Notes (Signed)
Pt sent from PCP for fall yesterday and hit head. Pt also fell on Thursday and Friday last week. Pt denies LOC or taking any blood thinners. Pt having head and middle to lower back pains. Pt reports little nausea but no alterations from her baseline Pt speaks [place] and used [employer] for interpretation.

## 2018-12-17 NOTE — ED Provider Notes (Signed)
[facility] DEPT Provider Note   CSN: [id number] Arrival date & time: 12/17/18  1549    History   Chief Complaint Chief Complaint  Patient presents with  . Fall  . Head Injury    HPI LATESIA NORRINGTON is a 85 y.o. female.     Patient lives with her daughter brought in with her daughter.  Patient's daughter speaks good [place].  According to daughter patient has had 3 falls since Friday.  And did have one today.  She went to her primary care doctor which referred her her in for evaluation here due to a hematoma in the right parietal area and a lot of bruising to the right side of the scalp and into the neck area.  Patient also complaining of low back pain as well.  Planing of head pain.  Complaining of a little bit of nausea.  The the falls had no loss of consciousness with them and patient is not on blood thinners.  Patient seems to be off balance.  Daughter states that her mental status is at baseline.     Past Medical History:  Diagnosis Date  . Hypertension   . Mass of right side of neck   . Oxygen dependent     Patient Active Problem List   Diagnosis Date Noted  . Multiple falls 12/17/2018  . Head injury 12/17/2018  . Hematoma of scalp 12/17/2018  . Pedal edema 10/21/2018  . Osteoporosis 09/12/2018  . Overactive bladder 08/29/2018  . O2 dependent 08/11/2018  . Hyponatremia 08/02/2018  . Gastroesophageal reflux disease 07/16/2018  . Chronic pain syndrome 01/07/2018  . Chronic obstructive pulmonary disease ([place]) 08/20/2017  . Multinodular goiter 08/15/2017  . Bilateral leg pain 07/15/2017  . Chronic bilateral low back pain without sciatica 07/15/2017  . Essential hypertension, benign 07/15/2017  . Hypercalcemia 07/15/2017  . Depression with anxiety 01/06/2015  . 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 DJD (degenerative joint disease) 02/16/2013  . Tobacco user 09/23/2012  . Menopause 08/21/2012  . Postmenopausal atrophic  vaginitis 08/21/2012    Past Surgical History:  Procedure Laterality Date  . APPENDECTOMY    . BIOPSY  08/24/2018   Procedure: BIOPSY;  Surgeon: Thornton Park, MD;  Location: [facility];  Service: Gastroenterology;;  . ESOPHAGOGASTRODUODENOSCOPY (EGD) WITH PROPOFOL N/A 08/24/2018   Procedure: ESOPHAGOGASTRODUODENOSCOPY (EGD) WITH PROPOFOL;  Surgeon: Thornton Park, MD;  Location: [facility];  Service: Gastroenterology;  Laterality: N/A;     OB History   No obstetric history on file.      Home Medications    Prior to Admission medications   Medication Sig Start Date End Date Taking? Authorizing Provider  acetaminophen (TYLENOL) 500 MG tablet Take 2 tablets (1,000 mg total) by mouth every 8 (eight) hours. 07/23/18  Yes Jill Alexanders, PA-C  calcium elemental as carbonate (TUMS ULTRA 1000) 400 MG chewable tablet Chew 3 tablets (1,200 mg total) by mouth 2 times daily at 12 noon and 4 pm. 09/18/18  Yes Hennie Duos, MD  lisinopril (PRINIVIL,ZESTRIL) 40 MG tablet Take 1 tablet (40 mg total) by mouth daily. 12/17/18  Yes Sagardia, Ines Bloomer, MD  Multiple Vitamin (MULTIVITAMIN WITH MINERALS) TABS tablet Take 1 tablet by mouth daily. 09/18/18  Yes Hennie Duos, MD  omeprazole (PRILOSEC) 20 MG capsule TAKE 2 CAPSULES BY MOUTH TWICE DAILY BEFORE A MEAL 09/18/18  Yes Hennie Duos, MD  oxybutynin (DITROPAN-XL) 10 MG 24 hr tablet Take 10 mg by mouth daily. 11/12/18  Yes [provider]  albuterol (PROVENTIL HFA;VENTOLIN HFA) 108 (90 Base) MCG/ACT inhaler Inhale 2 puffs into the lungs every 4 (four) hours as needed for wheezing or shortness of breath. Patient not taking: Reported on 12/17/2018 09/18/18   Hennie Duos, MD  bismuth subsalicylate (PEPTO BISMOL) 262 MG chewable tablet Chew 2 tablets (524 mg total) by mouth as needed. Patient not taking: Reported on 12/17/2018 09/18/18   Hennie Duos, MD  diclofenac sodium (VOLTAREN) 1 % GEL Apply 2 g  topically 4 (four) times daily. Patient not taking: Reported on 12/17/2018 09/18/18   Hennie Duos, MD  docusate sodium (COLACE) 100 MG capsule Take 1 capsule (100 mg total) by mouth 2 (two) times daily. Patient not taking: Reported on 12/17/2018 09/18/18   Hennie Duos, MD  Glucos-Chond-Hyal Ac-Ca Fructo (MOVE FREE JOINT HEALTH ADVANCE) TABS Take 1 tablet by mouth daily. Patient not taking: Reported on 12/17/2018 09/18/18   Hennie Duos, MD  mirabegron ER (MYRBETRIQ) 25 MG TB24 tablet Take 1 tablet (25 mg total) by mouth daily. Patient not taking: Reported on 12/17/2018 09/18/18   Hennie Duos, MD  ondansetron (ZOFRAN-ODT) 4 MG disintegrating tablet Take 1 tablet (4 mg total) by mouth every 6 (six) hours as needed for nausea. Patient not taking: Reported on 12/17/2018 09/18/18   Hennie Duos, MD  polyethylene glycol powder [facility]) powder Take 17 g by mouth 2 (two) times daily as needed for moderate constipation. Patient not taking: Reported on 12/17/2018 09/18/18   Hennie Duos, MD  traMADol (ULTRAM) 50 MG tablet Take 1 tablet (50 mg total) by mouth every 8 (eight) hours as needed. Patient not taking: Reported on 12/17/2018 10/20/18   Horald Pollen, MD    Family History Family History  Problem Relation Age of Onset  . Hypercalcemia Neg Hx   . Osteoporosis Neg Hx     Social History Social History   Tobacco Use  . Smoking status: Current Every Day Smoker    Packs/day: 0.70    Years: 60.00    Pack years: 42.00    Types: Cigarettes  . Smokeless tobacco: Never Used  Substance Use Topics  . Alcohol use: No    Alcohol/week: 0.0 standard drinks  . Drug use: No     Allergies   Patient has no known allergies.   Review of Systems Review of Systems  Constitutional: Negative for chills and fever.  HENT: Negative for rhinorrhea and sore throat.   Eyes: Negative for visual disturbance.  Respiratory: Negative for cough and shortness of  breath.   Cardiovascular: Negative for chest pain and leg swelling.  Gastrointestinal: Positive for nausea. Negative for abdominal pain, diarrhea and vomiting.  Genitourinary: Negative for dysuria.  Musculoskeletal: Negative for back pain and neck pain.  Skin: Negative for rash.  Neurological: Positive for headaches. Negative for dizziness and light-headedness.  Hematological: Does not bruise/bleed easily.  Psychiatric/Behavioral: Negative for confusion.     Physical Exam Updated Vital Signs BP (!) 163/68   Pulse 76   Temp 97.6 F (36.4 C) (Oral)   Resp 17   SpO2 98%   Physical Exam Vitals signs and nursing note reviewed.  Constitutional:      General: She is not in acute distress.    Appearance: She is well-developed.  HENT:     Head: Normocephalic.     Comments: Patient right parietal temporal area with a scalp hematoma probably measuring about 6 cm.  With some ecchymosis on  the scalp and also ecchymosis and and bruising down around the right lateral part of the neck to the clavicle area.  There is a fullness in that area.  Which also could represent a hematoma.    Mouth/Throat:     Mouth: Mucous membranes are moist.  Eyes:     Extraocular Movements: Extraocular movements intact.     Conjunctiva/sclera: Conjunctivae normal.     Pupils: Pupils are equal, round, and reactive to light.  Neck:     Musculoskeletal: Normal range of motion and neck supple. No neck rigidity.  Cardiovascular:     Rate and Rhythm: Normal rate and regular rhythm.     Heart sounds: No murmur.  Pulmonary:     Effort: Pulmonary effort is normal. No respiratory distress.     Breath sounds: Normal breath sounds.  Abdominal:     Palpations: Abdomen is soft.     Tenderness: There is no abdominal tenderness.  Musculoskeletal:        General: No deformity.     Comments: No tenderness with movement of both legs.  But tenderness throughout the upper part of the lumbar spine.  Skin:    General: Skin is  warm and dry.     Capillary Refill: Capillary refill takes less than 2 seconds.  Neurological:     General: No focal deficit present.     Mental Status: She is alert. Mental status is at baseline.      ED Treatments / Results  Labs (all labs ordered are listed, but only abnormal results are displayed) Labs Reviewed  BASIC METABOLIC PANEL - Abnormal; Notable for the following components:      Result Value   Sodium 123 (*)    Chloride 92 (*)    All other components within normal limits  CBC WITH DIFFERENTIAL/PLATELET - Abnormal; Notable for the following components:   RBC 2.81 (*)    Hemoglobin 9.0 (*)    HCT 26.7 (*)    All other components within normal limits    EKG None  Radiology Dg Lumbar Spine Complete  Result Date: 12/17/2018 CLINICAL DATA:  Pt sent from PCP for fall yesterday and hit head. Pt also fell on Thursday and Friday last week. Pt denies LOC or taking any blood thinners. Pt having head and middle to lower back pains. Pt reports little nausea but no alterations from her baseline EXAM: LUMBAR SPINE - COMPLETE 4+ VIEW COMPARISON:  CT abdomen pelvis, 07/19/2018 FINDINGS: Previously noted fracture T12 has increased in severity, now severe, with compression primarily of the central and anterior aspect of the vertebral body. No other fractures.  No spondylolisthesis.  No bone lesions. Skeletal structures are diffusely demineralized. Moderate loss of disc height with mild endplate sclerosis at G2-X5. Mild loss of disc height at L3-L4 and L4-L5. There are scattered atherosclerotic calcifications along the abdominal aorta and iliac arteries. Mild focal bulge of the infrarenal abdominal aorta, 2.6 cm. These findings are stable from the prior CT. IMPRESSION: 1. Increase in severity of the compression fracture of T12 when compared to the prior CT. This could be recent. 2. No other fractures. 3. Degenerative changes as described stable from the prior CT. Electronically Signed   By:  Lajean Manes M.D.   On: 12/17/2018 18:26   Ct Head Wo Contrast  Result Date: 12/17/2018 CLINICAL DATA:  85 y/o F; fall yesterday with head injury. Head and back pain. EXAM: CT HEAD WITHOUT CONTRAST CT CERVICAL SPINE WITHOUT CONTRAST TECHNIQUE: Multidetector CT  imaging of the head and cervical spine was performed following the standard protocol without intravenous contrast. Multiplanar CT image reconstructions of the cervical spine were also generated. COMPARISON:  08/18/2018 CT head and cervical spine. FINDINGS: CT HEAD FINDINGS Brain: No evidence of acute infarction, hemorrhage, hydrocephalus, extra-axial collection or mass lesion/mass effect. Stable small chronic infarctions within the bilateral anterior basal ganglia. Stable chronic microvascular ischemic changes and volume loss of the brain. Stable small prominent extra-axial space in the left parafalcine frontal region, likely a small underlying arachnoid cyst. Vascular: Calcific atherosclerosis of the carotid siphons and vertebral arteries. No hyperdense vessel identified. Skull: Large right parietal region scalp contusion with hematoma. Sinuses/Orbits: No acute finding. Other: None. CT CERVICAL SPINE FINDINGS Alignment: C5-6 grade 1 retrolisthesis is stable. Skull base and vertebrae: No acute fracture. No primary bone lesion or focal pathologic process. Soft tissues and spinal canal: Right neck mass deep to the parotid tail and posterior to right submandibular gland is increased in size measuring 3.6 x 2.2 cm (AP by ML series 9, image 37). Left deep parotid mass is increased in size measuring 24 x 20 mm (AP by ML series 9, image 21). Calcific atherosclerosis of the carotid bifurcations and the aortic arch. Disc levels: Cervical spondylosis with prominent discogenic degenerative changes at the C3-4 and C5-6 levels resulting in approximately moderate spinal canal stenosis. Additionally, uncovertebral and facet hypertrophy encroaches on the bilateral neural  foramen at multiple levels greatest at the left C4-5 and bilateral C5-6 levels. Upper chest: Negative. Other: Negative. IMPRESSION: CT head: 1. Large right parietal region scalp contusion with hematoma. No calvarial fracture. 2. No acute intracranial abnormality identified. 3. Stable chronic microvascular ischemic changes and volume loss of the brain. 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 CT cervical spine: 1. No acute fracture or dislocation identified. 2. Mild interval increase in size of right neck mass posterior to right submandibular gland and left deep parotid gland mass. Surgical consultation and/or tissue sampling is recommended if not already acquired. 3. Stable cervical spondylosis greatest at the C3-4 and C5-6 levels. Electronically Signed   By: Kristine Garbe M.D.   On: 12/17/2018 18:51   Ct Cervical Spine Wo Contrast  Result Date: 12/17/2018 CLINICAL DATA:  85 y/o F; fall yesterday with head injury. Head and back pain. EXAM: CT HEAD WITHOUT CONTRAST CT CERVICAL SPINE WITHOUT CONTRAST TECHNIQUE: Multidetector CT imaging of the head and cervical spine was performed following the standard protocol without intravenous contrast. Multiplanar CT image reconstructions of the cervical spine were also generated. COMPARISON:  08/18/2018 CT head and cervical spine. FINDINGS: CT HEAD FINDINGS Brain: No evidence of acute infarction, hemorrhage, hydrocephalus, extra-axial collection or mass lesion/mass effect. Stable small chronic infarctions within the bilateral anterior basal ganglia. Stable chronic microvascular ischemic changes and volume loss of the brain. Stable small prominent extra-axial space in the left parafalcine frontal region, likely a small underlying arachnoid cyst. Vascular: Calcific atherosclerosis of the carotid siphons and vertebral arteries. No hyperdense vessel identified. Skull: Large right parietal region scalp contusion with hematoma.  Sinuses/Orbits: No acute finding. Other: None. CT CERVICAL SPINE FINDINGS Alignment: C5-6 grade 1 retrolisthesis is stable. Skull base and vertebrae: No acute fracture. No primary bone lesion or focal pathologic process. Soft tissues and spinal canal: Right neck mass deep to the parotid tail and posterior to right submandibular gland is increased in size measuring 3.6 x 2.2 cm (AP by ML series 9, image 37). Left deep parotid mass is increased in  size measuring 24 x 20 mm (AP by ML series 9, image 21). Calcific atherosclerosis of the carotid bifurcations and the aortic arch. Disc levels: Cervical spondylosis with prominent discogenic degenerative changes at the C3-4 and C5-6 levels resulting in approximately moderate spinal canal stenosis. Additionally, uncovertebral and facet hypertrophy encroaches on the bilateral neural foramen at multiple levels greatest at the left C4-5 and bilateral C5-6 levels. Upper chest: Negative. Other: Negative. IMPRESSION: CT head: 1. Large right parietal region scalp contusion with hematoma. No calvarial fracture. 2. No acute intracranial abnormality identified. 3. Stable chronic microvascular ischemic changes and volume loss of the brain. 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 CT cervical spine: 1. No acute fracture or dislocation identified. 2. Mild interval increase in size of right neck mass posterior to right submandibular gland and left deep parotid gland mass. Surgical consultation and/or tissue sampling is recommended if not already acquired. 3. Stable cervical spondylosis greatest at the C3-4 and C5-6 levels. Electronically Signed   By: Kristine Garbe M.D.   On: 12/17/2018 18:51   Dg Hips Bilat W Or Wo Pelvis 3-4 Views  Result Date: 12/17/2018 CLINICAL DATA:  Fall. EXAM: DG HIP (WITH OR WITHOUT PELVIS) 3-4V BILAT COMPARISON:  None. FINDINGS: Bones are markedly demineralized. There is abdominal aortic atherosclerosis. SI  joints and symphysis pubis unremarkable. No evidence for superior or inferior pubic ramus fracture. AP and frog-leg lateral views of the left hip show no femoral neck fracture. AP and frog-leg lateral views of the right hip show no femoral neck fracture. IMPRESSION: Negative. Electronically Signed   By: Misty Stanley M.D.   On: 12/17/2018 18:25    Procedures Procedures (including critical care time)  Medications Ordered in ED Medications  0.9 %  sodium chloride infusion ( Intravenous New Bag/Given 12/17/18 2039)  sodium chloride 0.9 % bolus 500 mL (500 mLs Intravenous New Bag/Given 12/17/18 2039)     Initial Impression / Assessment and Plan / ED Course  I have reviewed the triage vital signs and the nursing notes.  Pertinent labs & imaging results that were available during my care of the patient were reviewed by me and considered in my medical decision making (see chart for details).       Patient's work-up for the falls over the past few days is had 3 falls since Friday.  Without evidence of any skull fracture or brain injury.  But does have a fairly significant scalp hematoma to the right parietal temporal area.  CT of the neck without any acute bony abnormalities there.  Did show evidence of a right-sided neck mass which may be the multinodular goiter that of talked about in the past but not sure.  Clinically I thought maybe it was due to a little bit of fluid collection since there is a lot of ecchymosis in that area from the falls.  X-rays of the lumbar back showed compression fracture to T12 that appears to have an acute component to it.  X-rays of pelvis and both hips without any acute abnormalities there.  Electrolytes showed that she had some hyponatremia.  And also her blood pressure was significantly elevated here even though she is been taking her blood pressure medicine.  Gust with hospitalist who will admit for the hyponatremia and for better blood pressure control.  Patient  alert but clearly has a home environment though very caring daughter that she lives with it may be a bit unsafe since patient has had 3 falls since  Friday.  Admitting team aware of this.   Final Clinical Impressions(s) / ED Diagnoses   Final diagnoses:  Fall, initial encounter  Hematoma of scalp, initial encounter  Neck mass  Hyponatremia  Hypertensive arterionephrosclerosis of transplanted kidney    ED Discharge Orders    None       Fredia Sorrow, MD 12/18/18 [phone]

## 2018-12-17 NOTE — Patient Instructions (Addendum)
[facility] Emergency Dept [place].  Phone number [phone]  Go to the emergency room now for further evaluation and diagnostic imaging. Head Injury, Adult There are many types of head injuries. They can be as minor as a bump. Some head injuries can be worse. Worse injuries include:  A strong hit to the head that hurts the brain (concussion).  A bruise of the brain (contusion). This means there is bleeding in the brain that can cause swelling.  A cracked skull (skull fracture).  Bleeding in the brain that gathers, gets thick (makes a clot), and forms a bump (hematoma). Most problems from a head injury come in the first 24 hours. However, you may still have side effects up to 7-10 days after your injury. It is important to watch your condition for any changes. Follow these instructions at home: Activity  Rest as much as possible.  Avoid activities that are hard or tiring.  Make sure you get enough sleep.  Limit activities that need a lot of thought or attention, such as: ? Watching TV. ? Playing memory games and puzzles. ? Job-related work or homework. ? Working on [employer], and texting.  Avoid activities that could cause another head injury until your doctor says it is okay. This includes playing sports.  Ask your doctor when it is safe for you to go back to your normal activities, such as work or school. Ask your doctor for a step-by-step plan for slowly going back to your normal activities.  Ask your doctor when you can drive, ride a bicycle, or use heavy machinery. Never do these activities if you are dizzy. Lifestyle  Do not drink alcohol until your doctor says it is okay.  Avoid drug use.  If it is harder than usual to remember things, write them down.  If you are easily distracted, try to do one thing at a time.  Talk with family members or close friends when making important decisions.  Tell your friends, family, a trusted  coworker, and work Freight forward[employer] about your injury, symptoms, and limits (restrictions). Have them watch for any problems that are new or getting worse. General instructions  Take over-the-counter and prescription medicines only as told by your doctor.  Have someone stay with you for 24 hours after your head injury. This person should watch you for any changes in your symptoms and be ready to get help.  Keep all follow-up visits as told by your doctor. This is important. How is this prevented?  Work on [employer]. This can help you avoid falls.  Wear a seatbelt when you are in a moving vehicle.  Wear a helmet when: ? Riding a bicycle. ? Skiing. ? Doing any other sport or activity that has a risk of injury.  Drink alcohol only in moderation.  Make your home safer by: ? Getting rid of clutter from the floors and stairs, like things that can make you trip. ? Using grab bars in bathrooms and handrails by stairs. ? Placing non-slip mats on floors and in bathtubs. ? Putting more light in dim areas. Get help right away if:  You have: ? A very bad (severe) headache that is not helped by medicine. ? Trouble walking or weakness in your arms and legs. ? Clear or bloody fluid coming from your nose or ears. ? Changes in your seeing (vision). ? Jerky movements that you cannot control (seizure).  You throw up (vomit).  Your symptoms  get worse.  You lose balance.  Your speech is slurred.  You pass out.  You are sleepier and have trouble staying awake.  The black centers of your eyes (pupils) change in size. These symptoms may be an emergency. Do not wait to see if the symptoms will go away. Get medical help right away. Call your local emergency services (911 in the [place] Do not drive yourself to the hospital. This information is not intended to replace advice given to you by your health care provider. Make sure you discuss any questions you have with your health care  provider. Document Released: 09/19/2008 Document Revised: 07/01/2018 Document Reviewed: 04/16/2016 Elsevier Interactive Patient Education  [employer].    If you have lab work done today you will be contacted with your lab results within the next 2 weeks.  If you have not heard from [place] then please contact us. The fastest way to get your results is to register for My Chart.   IF you received an x-ray today, you will receive an invoice from [facility]. Please contact [facility] at [phone] with questions or concerns regarding your invoice.   IF you received labwork today, you will receive an invoice from [place]. Please contact [facility] at [phone] with questions or concerns regarding your invoice.   Our billing staff will not be able to assist you with questions regarding bills from these companies.  You will be contacted with the lab results as soon as they are available. The fastest way to get your results is to activate your My Chart account. Instructions are located on the last page of this paperwork. If you have not heard from [place] regarding the results in 2 weeks, please contact this office.

## 2018-12-17 NOTE — H&P (Signed)
History and Physical    PLEASE NOTE THAT DRAGON DICTATION SOFTWARE WAS USED IN THE CONSTRUCTION OF THIS NOTE.   [name] DJS:[id number] DOB: [birth year] DOA: 12/17/2018  PCP: Horald Pollen, MD Patient coming from: home  I have personally briefly reviewed patient's old medical records in [place]  Chief Complaint: frequent falls  HPI: Barbara Dennis is a 85 y.o. female with medical history significant for frequent falls, hypertension, COPD, chronic hyponatremia, anemia of chronic disease with baseline hemoglobin of 9.5-11, who was admitted to [facility] long hospital on 12/17/2018 with acute on chronic hyponatremia after presenting from home to the [facility] long emergency department for further evaluation of frequent falls.   The following history is obtained via discussions with the patient via interpreter, discussions with the patient's daughter, discussions with the ED physician, and via chart review.  The patient's daughter conveys that the patient has exhibited recurrent falls for greater than 1 year, typically at a frequency of 1 fall per 1 to 2 weeks.  She states that the patient's falls have been mechanical in nature, stating that the patient falls as a result of tripping while ambulating and have not been associated with any loss of consciousness.  The patient experienced 3 such falls over the last 2 to 3 days, with one fall resulting in the patient hitting her head on the floor, in the absence of any associated loss of consciousness.  Not associated with any chest pain, palpitations, dizziness, presyncope, syncope, nausea, vomiting, or subjective fever, chills, or rigors.  Not associated with any recent change in vision, acute focal weakness, paresthesias, numbness, or vertigo.  Patient lives at home with her daughter.  At this time, the patient reports mild soreness over the right parietal lobe, but denies any significant neck discomfort.  In the setting of the patient  hitting her head as a component of 1 of the free most recent falls, she has brought to [facility] long emergency department for further evaluation.    ED Course: Vital signs in the emergency department were notable for the following: Temperature max 97.6; initial heart rate 92, which decreased to 76 following interval IV fluids, as further described below; blood pressure 143/68-160 8/70; respiratory rate 17-19; and oxygen saturation 83 to 100% on room air.  Labs in the ED were notable for the following: BMP notable for sodium 123 compared to most recent prior value of 131 on 07/23/2018; chloride 92, bicarbonate 23, creatinine 0.77.  CBC notable for white blood cell count of 8000, hemoglobin 9, compared to most recent prior value of 9.4 on 07/23/2018.   Noncontrast CT of the head, per final radiology report showed no evidence of intracranial hemorrhage or acute infarct, but rather showed stable small chronic infarctions within the bilateral anterior basal ganglia associated with stable chronic microvascular ischemic changes and associated volume loss of the brain.  Noncontrast CT that also showed a right parietal scalp contusion with hematoma.  CT cervical spine, per final radiology report showed no evidence of skull fracture, but showed right neck mass deep to the parotid tail and posterior to the right submandibular gland, with slight in full increase in size relative to imaging performed on 08/18/2018.  Plain films of the bilateral hips showed no evidence of acute fracture or dislocation.    Review of Systems: As per HPI otherwise 10 point review of systems negative.   Past Medical History:  Diagnosis Date  . GERD (gastroesophageal reflux disease)   . Hypertension   .  Mass of right side of neck   . Oxygen dependent   . Urinary incontinence     Past Surgical History:  Procedure Laterality Date  . APPENDECTOMY    . BIOPSY  08/24/2018   Procedure: BIOPSY;  Surgeon: Thornton Park, MD;   Location: [facility];  Service: Gastroenterology;;  . ESOPHAGOGASTRODUODENOSCOPY (EGD) WITH PROPOFOL N/A 08/24/2018   Procedure: ESOPHAGOGASTRODUODENOSCOPY (EGD) WITH PROPOFOL;  Surgeon: Thornton Park, MD;  Location: [facility];  Service: Gastroenterology;  Laterality: N/A;    Social History:  reports that she has been smoking cigarettes. She has a 42.00 pack-year smoking history. She has never used smokeless tobacco. She reports that she does not drink alcohol or use drugs.   No Known Allergies  Family History  Problem Relation Age of Onset  . Hypercalcemia Neg Hx   . Osteoporosis Neg Hx      Prior to Admission medications   Medication Sig Start Date End Date Taking? Authorizing Provider  acetaminophen (TYLENOL) 500 MG tablet Take 2 tablets (1,000 mg total) by mouth every 8 (eight) hours. 07/23/18  Yes Jill Alexanders, PA-C  calcium elemental as carbonate (TUMS ULTRA 1000) 400 MG chewable tablet Chew 3 tablets (1,200 mg total) by mouth 2 times daily at 12 noon and 4 pm. 09/18/18  Yes Hennie Duos, MD  lisinopril (PRINIVIL,ZESTRIL) 40 MG tablet Take 1 tablet (40 mg total) by mouth daily. 12/17/18  Yes Sagardia, Ines Bloomer, MD  Multiple Vitamin (MULTIVITAMIN WITH MINERALS) TABS tablet Take 1 tablet by mouth daily. 09/18/18  Yes Hennie Duos, MD  omeprazole (PRILOSEC) 20 MG capsule TAKE 2 CAPSULES BY MOUTH TWICE DAILY BEFORE A MEAL 09/18/18  Yes Hennie Duos, MD  oxybutynin (DITROPAN-XL) 10 MG 24 hr tablet Take 10 mg by mouth daily. 11/12/18  Yes [provider]  albuterol (PROVENTIL HFA;VENTOLIN HFA) 108 (90 Base) MCG/ACT inhaler Inhale 2 puffs into the lungs every 4 (four) hours as needed for wheezing or shortness of breath. Patient not taking: Reported on 12/17/2018 09/18/18   Hennie Duos, MD  bismuth subsalicylate (PEPTO BISMOL) 262 MG chewable tablet Chew 2 tablets (524 mg total) by mouth as needed. Patient not taking: Reported on 12/17/2018  09/18/18   Hennie Duos, MD  diclofenac sodium (VOLTAREN) 1 % GEL Apply 2 g topically 4 (four) times daily. Patient not taking: Reported on 12/17/2018 09/18/18   Hennie Duos, MD  docusate sodium (COLACE) 100 MG capsule Take 1 capsule (100 mg total) by mouth 2 (two) times daily. Patient not taking: Reported on 12/17/2018 09/18/18   Hennie Duos, MD  Glucos-Chond-Hyal Ac-Ca Fructo (MOVE FREE JOINT HEALTH ADVANCE) TABS Take 1 tablet by mouth daily. Patient not taking: Reported on 12/17/2018 09/18/18   Hennie Duos, MD  mirabegron ER (MYRBETRIQ) 25 MG TB24 tablet Take 1 tablet (25 mg total) by mouth daily. Patient not taking: Reported on 12/17/2018 09/18/18   Hennie Duos, MD  ondansetron (ZOFRAN-ODT) 4 MG disintegrating tablet Take 1 tablet (4 mg total) by mouth every 6 (six) hours as needed for nausea. Patient not taking: Reported on 12/17/2018 09/18/18   Hennie Duos, MD  polyethylene glycol powder [facility]) powder Take 17 g by mouth 2 (two) times daily as needed for moderate constipation. Patient not taking: Reported on 12/17/2018 09/18/18   Hennie Duos, MD  traMADol (ULTRAM) 50 MG tablet Take 1 tablet (50 mg total) by mouth every 8 (eight) hours as needed. Patient not taking: Reported on  12/17/2018 10/20/18   Horald Pollen, MD      Objective     Physical Exam: Vitals:   12/17/18 1901 12/17/18 1930 12/17/18 2000 12/17/18 2030  BP: (!) 168/70 (!) 158/67 (!) 151/66 (!) 163/68  Pulse: 81 78 78 76  Resp: 17 19 18 17   Temp:      TempSrc:      SpO2: 96% 93% 95% 98%    General: appears to be stated age; alert, oriented Skin: warm, dry, no rash Head:  Right parietal scalp contusion with hematoma noted.  Mouth:  Oral mucosa membranes appear dry, normal dentition Neck: supple; trachea midline Heart:  RRR; did not appreciate any M/R/G Lungs: CTAB, did not appreciate any wheezes, rales, or rhonchi Abdomen: + BS; soft, ND, NT Extremities: no  peripheral edema, no muscle wasting   Labs on Admission: I have personally reviewed following labs and imaging studies  CBC: Recent Labs  Lab 12/17/18 1901  WBC 8.1  NEUTROABS 5.2  HGB 9.0*  HCT 26.7*  MCV 95.0  PLT 371   Basic Metabolic Panel: Recent Labs  Lab 12/17/18 1901  NA 123*  K 4.4  CL 92*  CO2 23  GLUCOSE 93  BUN 21  CREATININE 0.77  CALCIUM 8.9   GFR: Estimated Creatinine Clearance: 34.7 mL/min (by C-G formula based on SCr of 0.77 mg/dL). Liver Function Tests: No results for input(s): AST, ALT, ALKPHOS, BILITOT, PROT, ALBUMIN in the last 168 hours. No results for input(s): LIPASE, AMYLASE in the last 168 hours. No results for input(s): AMMONIA in the last 168 hours. Coagulation Profile: No results for input(s): INR, PROTIME in the last 168 hours. Cardiac Enzymes: No results for input(s): CKTOTAL, CKMB, CKMBINDEX, TROPONINI in the last 168 hours. BNP (last 3 results) No results for input(s): PROBNP in the last 8760 hours. HbA1C: No results for input(s): HGBA1C in the last 72 hours. CBG: No results for input(s): GLUCAP in the last 168 hours. Lipid Profile: No results for input(s): CHOL, HDL, LDLCALC, TRIG, CHOLHDL, LDLDIRECT in the last 72 hours. Thyroid Function Tests: No results for input(s): TSH, T4TOTAL, FREET4, T3FREE, THYROIDAB in the last 72 hours. Anemia Panel: No results for input(s): VITAMINB12, FOLATE, FERRITIN, TIBC, IRON, RETICCTPCT in the last 72 hours. Urine analysis:    Component Value Date/Time   COLORURINE YELLOW 07/19/2018 1951   APPEARANCEUR CLEAR 07/19/2018 1951   LABSPEC 1.010 07/19/2018 1951   PHURINE 7.0 07/19/2018 1951   GLUCOSEU NEGATIVE 07/19/2018 1951   HGBUR NEGATIVE 07/19/2018 1951   BILIRUBINUR negative 09/28/2018 1151   BILIRUBINUR negative 05/01/2017 1447   KETONESUR trace (5) (A) 09/28/2018 [place] 07/19/2018 1951   PROTEINUR negative 09/28/2018 1151   PROTEINUR NEGATIVE 07/19/2018 1951    UROBILINOGEN 0.2 09/28/2018 1151   NITRITE Negative 09/28/2018 1151   NITRITE NEGATIVE 07/19/2018 1951   LEUKOCYTESUR Negative 09/28/2018 1151    Radiological Exams on Admission: Dg Lumbar Spine Complete  Result Date: 12/17/2018 CLINICAL DATA:  Pt sent from PCP for fall yesterday and hit head. Pt also fell on Thursday and Friday last week. Pt denies LOC or taking any blood thinners. Pt having head and middle to lower back pains. Pt reports little nausea but no alterations from her baseline EXAM: LUMBAR SPINE - COMPLETE 4+ VIEW COMPARISON:  CT abdomen pelvis, 07/19/2018 FINDINGS: Previously noted fracture T12 has increased in severity, now severe, with compression primarily of the central and anterior aspect of the vertebral body. No other fractures.  No spondylolisthesis.  No bone lesions. Skeletal structures are diffusely demineralized. Moderate loss of disc height with mild endplate sclerosis at O7-F6. Mild loss of disc height at L3-L4 and L4-L5. There are scattered atherosclerotic calcifications along the abdominal aorta and iliac arteries. Mild focal bulge of the infrarenal abdominal aorta, 2.6 cm. These findings are stable from the prior CT. IMPRESSION: 1. Increase in severity of the compression fracture of T12 when compared to the prior CT. This could be recent. 2. No other fractures. 3. Degenerative changes as described stable from the prior CT. Electronically Signed   By: Lajean Manes M.D.   On: 12/17/2018 18:26   Ct Head Wo Contrast  Result Date: 12/17/2018 CLINICAL DATA:  85 y/o F; fall yesterday with head injury. Head and back pain. EXAM: CT HEAD WITHOUT CONTRAST CT CERVICAL SPINE WITHOUT CONTRAST TECHNIQUE: Multidetector CT imaging of the head and cervical spine was performed following the standard protocol without intravenous contrast. Multiplanar CT image reconstructions of the cervical spine were also generated. COMPARISON:  08/18/2018 CT head and cervical spine. FINDINGS: CT HEAD  FINDINGS Brain: No evidence of acute infarction, hemorrhage, hydrocephalus, extra-axial collection or mass lesion/mass effect. Stable small chronic infarctions within the bilateral anterior basal ganglia. Stable chronic microvascular ischemic changes and volume loss of the brain. Stable small prominent extra-axial space in the left parafalcine frontal region, likely a small underlying arachnoid cyst. Vascular: Calcific atherosclerosis of the carotid siphons and vertebral arteries. No hyperdense vessel identified. Skull: Large right parietal region scalp contusion with hematoma. Sinuses/Orbits: No acute finding. Other: None. CT CERVICAL SPINE FINDINGS Alignment: C5-6 grade 1 retrolisthesis is stable. Skull base and vertebrae: No acute fracture. No primary bone lesion or focal pathologic process. Soft tissues and spinal canal: Right neck mass deep to the parotid tail and posterior to right submandibular gland is increased in size measuring 3.6 x 2.2 cm (AP by ML series 9, image 37). Left deep parotid mass is increased in size measuring 24 x 20 mm (AP by ML series 9, image 21). Calcific atherosclerosis of the carotid bifurcations and the aortic arch. Disc levels: Cervical spondylosis with prominent discogenic degenerative changes at the C3-4 and C5-6 levels resulting in approximately moderate spinal canal stenosis. Additionally, uncovertebral and facet hypertrophy encroaches on the bilateral neural foramen at multiple levels greatest at the left C4-5 and bilateral C5-6 levels. Upper chest: Negative. Other: Negative. IMPRESSION: CT head: 1. Large right parietal region scalp contusion with hematoma. No calvarial fracture. 2. No acute intracranial abnormality identified. 3. Stable chronic microvascular ischemic changes and volume loss of the brain. 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 CT cervical spine: 1. No acute fracture or dislocation identified. 2. Mild interval increase in  size of right neck mass posterior to right submandibular gland and left deep parotid gland mass. Surgical consultation and/or tissue sampling is recommended if not already acquired. 3. Stable cervical spondylosis greatest at the C3-4 and C5-6 levels. Electronically Signed   By: Kristine Garbe M.D.   On: 12/17/2018 18:51   Ct Cervical Spine Wo Contrast  Result Date: 12/17/2018 CLINICAL DATA:  85 y/o F; fall yesterday with head injury. Head and back pain. EXAM: CT HEAD WITHOUT CONTRAST CT CERVICAL SPINE WITHOUT CONTRAST TECHNIQUE: Multidetector CT imaging of the head and cervical spine was performed following the standard protocol without intravenous contrast. Multiplanar CT image reconstructions of the cervical spine were also generated. COMPARISON:  08/18/2018 CT head and cervical spine. FINDINGS: CT HEAD FINDINGS Brain:  No evidence of acute infarction, hemorrhage, hydrocephalus, extra-axial collection or mass lesion/mass effect. Stable small chronic infarctions within the bilateral anterior basal ganglia. Stable chronic microvascular ischemic changes and volume loss of the brain. Stable small prominent extra-axial space in the left parafalcine frontal region, likely a small underlying arachnoid cyst. Vascular: Calcific atherosclerosis of the carotid siphons and vertebral arteries. No hyperdense vessel identified. Skull: Large right parietal region scalp contusion with hematoma. Sinuses/Orbits: No acute finding. Other: None. CT CERVICAL SPINE FINDINGS Alignment: C5-6 grade 1 retrolisthesis is stable. Skull base and vertebrae: No acute fracture. No primary bone lesion or focal pathologic process. Soft tissues and spinal canal: Right neck mass deep to the parotid tail and posterior to right submandibular gland is increased in size measuring 3.6 x 2.2 cm (AP by ML series 9, image 37). Left deep parotid mass is increased in size measuring 24 x 20 mm (AP by ML series 9, image 21). Calcific atherosclerosis  of the carotid bifurcations and the aortic arch. Disc levels: Cervical spondylosis with prominent discogenic degenerative changes at the C3-4 and C5-6 levels resulting in approximately moderate spinal canal stenosis. Additionally, uncovertebral and facet hypertrophy encroaches on the bilateral neural foramen at multiple levels greatest at the left C4-5 and bilateral C5-6 levels. Upper chest: Negative. Other: Negative. IMPRESSION: CT head: 1. Large right parietal region scalp contusion with hematoma. No calvarial fracture. 2. No acute intracranial abnormality identified. 3. Stable chronic microvascular ischemic changes and volume loss of the brain. 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 CT cervical spine: 1. No acute fracture or dislocation identified. 2. Mild interval increase in size of right neck mass posterior to right submandibular gland and left deep parotid gland mass. Surgical consultation and/or tissue sampling is recommended if not already acquired. 3. Stable cervical spondylosis greatest at the C3-4 and C5-6 levels. Electronically Signed   By: Kristine Garbe M.D.   On: 12/17/2018 18:51   Dg Hips Bilat W Or Wo Pelvis 3-4 Views  Result Date: 12/17/2018 CLINICAL DATA:  Fall. EXAM: DG HIP (WITH OR WITHOUT PELVIS) 3-4V BILAT COMPARISON:  None. FINDINGS: Bones are markedly demineralized. There is abdominal aortic atherosclerosis. SI joints and symphysis pubis unremarkable. No evidence for superior or inferior pubic ramus fracture. AP and frog-leg lateral views of the left hip show no femoral neck fracture. AP and frog-leg lateral views of the right hip show no femoral neck fracture. IMPRESSION: Negative. Electronically Signed   By: Misty Stanley M.D.   On: 12/17/2018 18:25     Assessment/Plan   CHARM STENNER is a 85 y.o. female with medical history significant for frequent falls, hypertension, COPD, chronic hyponatremia, anemia of chronic disease  with baseline hemoglobin of 9.5-11, who was admitted to [facility] long hospital on 12/17/2018 with acute on chronic hyponatremia after presenting from home to the [facility] long emergency department for further evaluation of frequent falls.    Principal Problem:   Hyponatremia Active Problems:   Tobacco user   Essential hypertension, benign   Chronic obstructive pulmonary disease (HCC)   Frequent falls   Dehydration   Acute prerenal azotemia    #) Acute on chronic hypo-osmolar hyponatremia: In the setting of mild chronic hyponatremia with baseline serum sodium levels of 127 131, this evening's labs reflect a slight worsening of the patient's hyponatremia relative to this baseline, with presenting value noted to be 123.  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  Regarding acute worsening of sodium values relative to baseline, suspect a degree of dehydration given physical exam and includes dry oral mucous membranes as well as laboratory finding of prerenal azotemia.  Exacerbation of SIADH in the setting of recurrent falls and recent head trauma is another possibility, as is cerebral salt wasting syndrome given his history.  Another possibility includes home oxybutynin, rendering a relative urinary retention, which can pose an SIADH-like process.  No evidence of acute focal neurologic deficits.  While the patient's acute on chronic hyponatremia is suspected to be multifactorial in nature, there at least appears to be a component stemming from dehydration.  As result, will provide gentle IV fluids while further evaluating expanded differential, including close monitoring of follow-up sodium level in response to interval IV fluids.  Plan: Check random urine sodium and urine osmolality.  Check TSH.  Monitor strict I's and O's.  Check  urinalysis.  Hold home oxybutynin for now.  Will repeat BMP at 0100 to monitor for rapid correction of serum sodium level.  We will also repeat BMP in the morning.    #) Frequent falls: Patient's daughter reports what sounds like 3 ground-level mechanical falls over the last 2 days, that have occurred as a result of the patient tripping while ambulating in the absence of any associated preceding presyncope /syncope.  Falls also do not appear to be orthostatic in nature, and have not resulted in any reported loss of consciousness.  Per the patient's daughter, there appears to be a longstanding history of recurrent ground-level mechanical falls of a similar nature.  Suspect an element of generalized deconditioning.  In reviewing patient's home medications, no obvious offending central acting medications.  The patient did hit her head as a component of 1 of these recent falls, with CT head and neck demonstrating evidence of right parietal scalp contusion with hematoma, but no evidence of acute intracranial abnormality or fracture.   Plan: I placed a physical therapy consult to occur in the morning.  There may also be some benefit in a home health evaluation, determining if there is any room for optimization of ambulatory safety features at that location.    #) Right neck mass: In the context of a history of of a known right neck mass deep to the parotid tail and posterior to the right submandibular gland, this evening's CT of the neck shows a slight interval increase in size of this mass relative to imaging from 08/18/2018.  Per the daughter, the patient has elected to undergo intermittent surveillance CT scans to further monitor the right neck mass.   Plan: Continue outpatient surveillance scans, as above    #) Essential hypertension: On lisinopril as her sole outpatient antihypertensive agent.  Noted to be slightly hypertensive in the ED with systolic blood pressures in the 140s to 160s.  Plan: We  will continue home lisinopril and monitor ensuing blood pressures a routine vital signs.    #) COPD: As a result of a long history of tobacco abuse, although I have not encountered the results of any pulmonary function tests via chart review thus far.  On PRN albuterol inhaler as an outpatient.  No baseline supplemental oxygen requirements.  No evidence of acute COPD exacerbation at this time.  Plan: PRN albuterol nebulizer.  PRN supplemental oxygen order to maintain oxygen saturation greater than or equal to 92%.    #) Anemia of  chronic disease: Normocytic, normochromic anemia associated with baseline hemoglobin of 9.5-11.0.  Presenting hemoglobin found to be 9.0 in the absence of any evidence of active bleeding, although right parietal scalp hematoma is noted as consequence of recent falls. Not on any blood-thinners.   Plan: repeat CBC in the morning.     #) GERD: on omeprazole as an outpatient.  Plan: Continue home PPI.    DVT prophylaxis: scd's Code Status: full Disposition Plan:  Per Rounding Team Consults called: (none)  Admission status: Observation; MedSurg.    PLEASE NOTE THAT DRAGON DICTATION SOFTWARE WAS USED IN THE CONSTRUCTION OF THIS NOTE.   [place] [facility] Pager [phone] From [place].   Otherwise, please contact night-coverage  [email or web address] Password [facility]  12/17/2018, 9:37 PM

## 2018-12-17 NOTE — Progress Notes (Signed)
Brief note regarding plan, with full H&P to follow:   Barbara Dennis is a 85 y.o. female with medical history significant for frequent falls, hypertension, COPD, chronic hyponatremia, who was admitted to [facility] long hospital on 12/17/2018 with acute on chronic hyponatremia after presenting from home to the [facility] long emergency department for further evaluation of frequent falls.    #) Acute on chronic hyponatremia: In the setting of mild chronic hyponatremia with baseline serum sodium levels of 127 131, this evening's labs reflect a slight worsening of the patient's hyponatremia relative to this baseline, with presenting value noted to be 123.  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  Regarding acute worsening of sodium values relative to baseline, suspect a degree of dehydration given physical exam and includes dry oral mucous membranes as well as laboratory finding of prerenal azotemia.  Exacerbation of SIADH in the setting of recurrent falls and recent head trauma is another possibility, as is cerebral salt wasting syndrome given his history.  Another possibility includes home oxybutynin, rendering a relative urinary retention, which can pose an SIADH-like process.  No evidence of acute focal neurologic deficits.  While the patient's acute on chronic hyponatremia is suspected to be multifactorial in nature, there at least appears to be a component stemming from dehydration.  As result, will provide gentle IV fluids while further evaluating expanded differential, including close monitoring of follow-up sodium level in response to interval IV fluids.  Plan: Check random urine sodium and urine osmolality.  Check TSH.  Monitor strict I's and O's.  Check urinalysis.  Hold home oxybutynin for now.  Will repeat BMP at 0100 to monitor for  rapid correction of serum sodium level.  We will also repeat BMP in the morning.    Barbara Bertin, DO Hospitalist

## 2018-12-18 ENCOUNTER — Encounter (HOSPITAL_COMMUNITY): Payer: Self-pay | Admitting: Internal Medicine

## 2018-12-18 DIAGNOSIS — S22080A Wedge compression fracture of T11-T12 vertebra, initial encounter for closed fracture: Secondary | ICD-10-CM | POA: Diagnosis not present

## 2018-12-18 DIAGNOSIS — E860 Dehydration: Secondary | ICD-10-CM | POA: Diagnosis present

## 2018-12-18 DIAGNOSIS — Z9981 Dependence on supplemental oxygen: Secondary | ICD-10-CM | POA: Diagnosis not present

## 2018-12-18 DIAGNOSIS — K219 Gastro-esophageal reflux disease without esophagitis: Secondary | ICD-10-CM | POA: Diagnosis present

## 2018-12-18 DIAGNOSIS — I10 Essential (primary) hypertension: Secondary | ICD-10-CM | POA: Diagnosis present

## 2018-12-18 DIAGNOSIS — F17210 Nicotine dependence, cigarettes, uncomplicated: Secondary | ICD-10-CM | POA: Diagnosis present

## 2018-12-18 DIAGNOSIS — W010XXA Fall on same level from slipping, tripping and stumbling without subsequent striking against object, initial encounter: Secondary | ICD-10-CM | POA: Diagnosis present

## 2018-12-18 DIAGNOSIS — M810 Age-related osteoporosis without current pathological fracture: Secondary | ICD-10-CM | POA: Diagnosis not present

## 2018-12-18 DIAGNOSIS — R296 Repeated falls: Secondary | ICD-10-CM | POA: Diagnosis present

## 2018-12-18 DIAGNOSIS — J449 Chronic obstructive pulmonary disease, unspecified: Secondary | ICD-10-CM | POA: Diagnosis present

## 2018-12-18 DIAGNOSIS — K2970 Gastritis, unspecified, without bleeding: Secondary | ICD-10-CM | POA: Diagnosis not present

## 2018-12-18 DIAGNOSIS — D638 Anemia in other chronic diseases classified elsewhere: Secondary | ICD-10-CM | POA: Diagnosis present

## 2018-12-18 DIAGNOSIS — E871 Hypo-osmolality and hyponatremia: Secondary | ICD-10-CM | POA: Diagnosis not present

## 2018-12-18 DIAGNOSIS — N3281 Overactive bladder: Secondary | ICD-10-CM | POA: Diagnosis not present

## 2018-12-18 DIAGNOSIS — S0003XA Contusion of scalp, initial encounter: Secondary | ICD-10-CM | POA: Diagnosis present

## 2018-12-18 DIAGNOSIS — R7989 Other specified abnormal findings of blood chemistry: Secondary | ICD-10-CM | POA: Diagnosis present

## 2018-12-18 DIAGNOSIS — N19 Unspecified kidney failure: Secondary | ICD-10-CM | POA: Diagnosis present

## 2018-12-18 DIAGNOSIS — R221 Localized swelling, mass and lump, neck: Secondary | ICD-10-CM | POA: Diagnosis present

## 2018-12-18 DIAGNOSIS — Y92009 Unspecified place in unspecified non-institutional (private) residence as the place of occurrence of the external cause: Secondary | ICD-10-CM | POA: Diagnosis not present

## 2018-12-18 DIAGNOSIS — Z79899 Other long term (current) drug therapy: Secondary | ICD-10-CM | POA: Diagnosis not present

## 2018-12-18 DIAGNOSIS — E861 Hypovolemia: Secondary | ICD-10-CM | POA: Diagnosis present

## 2018-12-18 LAB — SODIUM, URINE, RANDOM: Sodium, Ur: 54 mmol/L

## 2018-12-18 LAB — CBC
HCT: 29.2 % — ABNORMAL LOW (ref 36.0–46.0)
Hemoglobin: 9.5 g/dL — ABNORMAL LOW (ref 12.0–15.0)
MCH: 31.7 pg (ref 26.0–34.0)
MCHC: 32.5 g/dL (ref 30.0–36.0)
MCV: 97.3 fL (ref 80.0–100.0)
Platelets: 259 10*3/uL (ref 150–400)
RBC: 3 MIL/uL — ABNORMAL LOW (ref 3.87–5.11)
RDW: 12.9 % (ref 11.5–15.5)
WBC: 7.3 10*3/uL (ref 4.0–10.5)
nRBC: 0 % (ref 0.0–0.2)

## 2018-12-18 LAB — BASIC METABOLIC PANEL
Anion gap: 6 (ref 5–15)
BUN: 17 mg/dL (ref 8–23)
CO2: 23 mmol/L (ref 22–32)
Calcium: 8.7 mg/dL — ABNORMAL LOW (ref 8.9–10.3)
Chloride: 98 mmol/L (ref 98–111)
Creatinine, Ser: 0.69 mg/dL (ref 0.44–1.00)
GFR calc Af Amer: >60 mL/min (ref >60)
GFR calc non Af Amer: >60 mL/min (ref >60)
Glucose, Bld: 103 mg/dL — ABNORMAL HIGH (ref 70–99)
Potassium: 4.2 mmol/L (ref 3.5–5.1)
Sodium: 127 mmol/L — ABNORMAL LOW (ref 135–145)

## 2018-12-18 LAB — TSH: TSH: 0.79 u[IU]/mL (ref 0.350–4.500)

## 2018-12-18 LAB — MAGNESIUM: Magnesium: 2 mg/dL (ref 1.7–2.4)

## 2018-12-18 LAB — CREATININE, URINE, RANDOM: Creatinine, Urine: 17.48 mg/dL

## 2018-12-18 LAB — OSMOLALITY, URINE: Osmolality, Ur: 204 mOsm/kg — ABNORMAL LOW (ref 300–900)

## 2018-12-18 MED ORDER — HYDROCODONE-ACETAMINOPHEN 5-325 MG PO TABS
1.0000 | ORAL_TABLET | Freq: Four times a day (QID) | ORAL | Status: DC | PRN
  Administered 2018-12-18 (×2): 1 via ORAL
  Filled 2018-12-18: qty 2
  Filled 2018-12-18: qty 1

## 2018-12-18 MED ORDER — HYDRALAZINE HCL 20 MG/ML IJ SOLN
10.0000 mg | Freq: Four times a day (QID) | INTRAMUSCULAR | Status: DC | PRN
  Administered 2018-12-19 (×2): 10 mg via INTRAVENOUS
  Filled 2018-12-18 (×2): qty 1

## 2018-12-18 NOTE — Evaluation (Signed)
Physical Therapy Evaluation Patient Details Name: Barbara Dennis MRN: [id number] DOB: 19-Mar-1934 Today's Date: 12/18/2018   History of Present Illness  85 y.o. female with medical history significant for frequent falls, hypertension, COPD, chronic hyponatremia, who was admittedwith acute on chronic hyponatremia and frequent falls.  REcent admission October 2019 for fall with T12 fracture and R rib fractures.   Clinical Impression  Pt admitted with above diagnosis. Pt currently with functional limitations due to the deficits listed below (see PT Problem List). Pt ambulated 9' with RW, no loss of balance, distance limited by fatigue.  Supervision for ambulation recommended due to h/o multiple falls.  No family present and interpreter ipads not functioning, so difficulty to obtain pt's home information. She did state she lives with her daughter and that her daughter doesn't work, so it is likely 102* assistance is available. Pt will benefit from skilled PT to increase their independence and safety with mobility to allow discharge to the venue listed below.       Follow Up Recommendations Home health PT; supervision for mobility    Equipment Recommendations  Wheelchair (measurements PT);Wheelchair cushion (measurements PT)(WC if she doesn't have one, due to frequent falls)    Recommendations for Other Services       Precautions / Restrictions Precautions Precautions: Fall Precaution Comments: multiple falls per chart recent admission October 2019 for fall with T12 fx and R rib fxs Restrictions Weight Bearing Restrictions: No      Mobility  Bed Mobility Overal bed mobility: Modified Independent             General bed mobility comments: with rail  Transfers Overall transfer level: Needs assistance Equipment used: Rolling walker (2 wheeled) Transfers: Sit to/from Stand Sit to Stand: Min guard         General transfer comment: min/guard for safety 2* h/o multiple falls  recently  Ambulation/Gait Ambulation/Gait assistance: Min guard Gait Distance (Feet): 60 Feet Assistive device: Rolling walker (2 wheeled) Gait Pattern/deviations: Step-through pattern;Decreased stride length Gait velocity: decr but WFL   General Gait Details: no loss of balance, pt able to maneuver RW with turns  Science writer    Modified Rankin (Stroke Patients Only)       Balance Overall balance assessment: History of Falls;Needs assistance   Sitting balance-Leahy Scale: Good       Standing balance-Leahy Scale: Fair Standing balance comment: relies on BUE support for dynamic standing balance                             Pertinent Vitals/Pain Pain Assessment: Faces Faces Pain Scale: Hurts even more Pain Location: R posterior/superior head from fall Pain Descriptors / Indicators: Guarding Pain Intervention(s): Limited activity within patient's tolerance;Monitored during session;Ice applied(issued ice pack, pt stated "I cannot do" -she seemed unable to tolerate the ice pack)    Home Living Family/patient expects to be discharged to:: Private residence Living Arrangements: Children;Non-relatives/Friends Available Help at Discharge: Family(pt stated she lives with her daughter, and that daughter does not work)           Actor: Environmental consultant - 2 wheels Additional Comments: difficult to obtain home info due to language barrier, per RN all interpreter iPads are not functioning at present    Prior Function           Comments: multiple falls, pt stated she "tries" to use  a walker     Hand Dominance        Extremity/Trunk Assessment   Upper Extremity Assessment Upper Extremity Assessment: Overall WFL for tasks assessed    Lower Extremity Assessment Lower Extremity Assessment: Overall WFL for tasks assessed(knee ext at least +3/5, difficult to perform manual muscle test 2* language barrier)    Cervical / Trunk  Assessment Cervical / Trunk Assessment: Normal  Communication   Communication: Prefers language other than [place];Other (comment)(interpreter ipads not functioning at present. Pt has some limited English. )  Cognition Arousal/Alertness: Awake/alert Behavior During Therapy: WFL for tasks assessed/performed Overall Cognitive Status: Difficult to assess                                 General Comments: appears [facility]      General Comments      Exercises     Assessment/Plan    PT Assessment Patient needs continued PT services  PT Problem List Decreased mobility;Decreased balance;Decreased activity tolerance       PT Treatment Interventions Gait training;Functional mobility training;Balance training;Patient/family education    PT Goals (Current goals can be found in the Care Plan section)  Acute Rehab PT Goals PT Goal Formulation: Patient unable to participate in goal setting Time For Goal Achievement: 01/01/19 Potential to Achieve Goals: Good    Frequency Min 3X/week   Barriers to discharge        Co-evaluation               AM-PAC PT "6 Clicks" Mobility  Outcome Measure Help needed turning from your back to your side while in a flat bed without using bedrails?: A Little Help needed moving from lying on your back to sitting on the side of a flat bed without using bedrails?: A Little Help needed moving to and from a bed to a chair (including a wheelchair)?: A Little Help needed standing up from a chair using your arms (e.g., wheelchair or bedside chair)?: A Little Help needed to walk in hospital room?: A Little Help needed climbing 3-5 steps with a railing? : A Little 6 Click Score: 18    End of Session Equipment Utilized During Treatment: Gait belt Activity Tolerance: Patient tolerated treatment well Patient left: in chair;with call bell/phone within reach;with chair alarm set Nurse Communication: Mobility status PT Visit Diagnosis: Unsteadiness  on feet (R26.81);Difficulty in walking, not elsewhere classified (R26.2);History of falling (Z91.81);Repeated falls (R29.6);Pain Pain - Right/Left: Right Pain - part of body: (head)    Time: 3888-2800 PT Time Calculation (min) (ACUTE ONLY): 20 min   Charges:   PT Evaluation $PT Eval Low Complexity: 1 Low          Blondell Reveal Kistler PT 12/18/2018  Acute Rehabilitation Services Pager [phone] Office [phone]

## 2018-12-18 NOTE — Progress Notes (Signed)
PROGRESS NOTE    Barbara Dennis  ZOX:[id number] DOB: 17-Dec-1933 DOA: 12/17/2018 PCP: Horald Pollen, MD   Brief Narrative:85 y.o. female with medical history significant for frequent falls, hypertension, COPD, chronic hyponatremia, anemia of chronic disease with baseline hemoglobin of 9.5-11, who was admitted to [facility] long hospital on 12/17/2018 with acute on chronic hyponatremia after presenting from home to the [facility] long emergency department for further evaluation of frequent falls.   The following history is obtained via discussions with the patient via interpreter, discussions with the patient's daughter, discussions with the ED physician, and via chart review.  The patient's daughter conveys that the patient has exhibited recurrent falls for greater than 1 year, typically at a frequency of 1 fall per 1 to 2 weeks.  She states that the patient's falls have been mechanical in nature, stating that the patient falls as a result of tripping while ambulating and have not been associated with any loss of consciousness.  The patient experienced 3 such falls over the last 2 to 3 days, with one fall resulting in the patient hitting her head on the floor, in the absence of any associated loss of consciousness.  Not associated with any chest pain, palpitations, dizziness, presyncope, syncope, nausea, vomiting, or subjective fever, chills, or rigors.  Not associated with any recent change in vision, acute focal weakness, paresthesias, numbness, or vertigo.  Patient lives at home with her daughter.  At this time, the patient reports mild soreness over the right parietal lobe, but denies any significant neck discomfort.  In the setting of the patient hitting her head as a component of 1 of the free most recent falls, she has brought to [facility] long emergency department for further evaluation.  ED Course: Vital signs in the emergency department were notable for the following: Temperature max 97.6;  initial heart rate 92, which decreased to 76 following interval IV fluids, as further described below; blood pressure 143/68-160 8/70; respiratory rate 17-19; and oxygen saturation 83 to 100% on room air.  Labs in the ED were notable for the following: BMP notable for sodium 123 compared to most recent prior value of 131 on 07/23/2018; chloride 92, bicarbonate 23, creatinine 0.77.  CBC notable for white blood cell count of 8000, hemoglobin 9, compared to most recent prior value of 9.4 on 07/23/2018.   Noncontrast CT of the head, per final radiology report showed no evidence of intracranial hemorrhage or acute infarct, but rather showed stable small chronic infarctions within the bilateral anterior basal ganglia associated with stable chronic microvascular ischemic changes and associated volume loss of the brain.  Noncontrast CT that also showed a right parietal scalp contusion with hematoma.  CT cervical spine, per final radiology report showed no evidence of skull fracture, but showed right neck mass deep to the parotid tail and posterior to the right submandibular gland, with slight in full increase in size relative to imaging performed on 08/18/2018.  Plain films of the bilateral hips showed no evidence of acute fracture or dislocation.  Assessment & Plan:   Principal Problem:   Hyponatremia Active Problems:   Tobacco user   Essential hypertension, benign   Chronic obstructive pulmonary disease (HCC)   Frequent falls   Dehydration   Acute prerenal azotemia   #1 hypovolemic hyponatremia possibly secondary to nutritional intake diminished.  She has chronic hyponatremia her baseline sodium is between 1 27-1 31.  Patient has been receiving normal saline overnight with improvement in her sodium.  Her sodium  is 127 today.  Decrease the rate of normal saline.  TSH normal.  Patient needs to see stay in the hospital for correction of hyponatremia and pending PT evaluation.  #2 frequent falls secondary  to generalized deconditioning and weakness.  This morning patient tells me her legs are weak denies any lower extremity pain but she is able to lift her both legs without any problem.  PT consult pending.  #3 hypertension continue lisinopril.  4 chronic right neck mass being followed as an outpatient with serial CT scans.  #5 history of COPD and tobacco abuse stable at this time.  #6 history of GERD continue omeprazole  #7 anemia of chronic disease stable monitor   Pressure Injury (Active)     Location:   Location Orientation:   Staging:   Wound Description (Comments):   Present on Admission:     Estimated body mass index is 17.42 kg/m as calculated from the following:   Height as of this encounter: 5' (1.524 m).   Weight as of this encounter: 40.5 kg.  DVT prophylaxis: SCD Code Status: Full code  family Communication: None Disposition Plan: Pending improvement in sodium as well as PT evaluation  Consultants:   None  Procedures: None Antimicrobials: None  Subjective: Patient resting in bed she denies any nausea vomiting diarrhea chest pain shortness of breath cough fever does complain of bilateral lower extremity weakness but moving all extremities without any pain.  Objective: Vitals:   12/17/18 2355 12/18/18 0159 12/18/18 0440 12/18/18 0912  BP: (!) 165/81 (!) 165/77 (!) 163/62 (!) 178/68  Pulse: 75 70 72   Resp:  15 16   Temp:  97.8 F (36.6 C) 97.7 F (36.5 C)   TempSrc:  Oral Oral   SpO2: 99% 97% 94%   Weight: 40.5 kg     Height: 5' (1.524 m)       Intake/Output Summary (Last 24 hours) at 12/18/2018 0928 Last data filed at 12/18/2018 0839 Gross per 24 hour  Intake 1440.24 ml  Output 1225 ml  Net 215.24 ml   Filed Weights   12/17/18 2355  Weight: 40.5 kg    Examination:  General exam: Appears calm and comfortable  Respiratory system: Clear to auscultation. Respiratory effort normal. Cardiovascular system: S1 & S2 heard, RRR. No JVD, murmurs,  rubs, gallops or clicks. No pedal edema. Gastrointestinal system: Abdomen is nondistended, soft and nontender. No organomegaly or masses felt. Normal bowel sounds heard. Central nervous system: Alert and oriented. No focal neurological deficits.  Moves all extremities Extremities: Symmetric 5 x 5 power. Skin: No rashes, lesions or ulcers Psychiatry: Judgement and insight appear normal. Mood & affect appropriate.     Data Reviewed: I have personally reviewed following labs and imaging studies  CBC: Recent Labs  Lab 12/17/18 1901 12/18/18 0041  WBC 8.1 7.3  NEUTROABS 5.2  --   HGB 9.0* 9.5*  HCT 26.7* 29.2*  MCV 95.0 97.3  PLT 218 938   Basic Metabolic Panel: Recent Labs  Lab 12/17/18 1901 12/18/18 0041  NA 123* 127*  K 4.4 4.2  CL 92* 98  CO2 23 23  GLUCOSE 93 103*  BUN 21 17  CREATININE 0.77 0.69  CALCIUM 8.9 8.7*  MG  --  2.0   GFR: Estimated Creatinine Clearance: 34.7 mL/min (by C-G formula based on SCr of 0.69 mg/dL). Liver Function Tests: No results for input(s): AST, ALT, ALKPHOS, BILITOT, PROT, ALBUMIN in the last 168 hours. No results for input(s): LIPASE, AMYLASE  in the last 168 hours. No results for input(s): AMMONIA in the last 168 hours. Coagulation Profile: No results for input(s): INR, PROTIME in the last 168 hours. Cardiac Enzymes: No results for input(s): CKTOTAL, CKMB, CKMBINDEX, TROPONINI in the last 168 hours. BNP (last 3 results) No results for input(s): PROBNP in the last 8760 hours. HbA1C: No results for input(s): HGBA1C in the last 72 hours. CBG: No results for input(s): GLUCAP in the last 168 hours. Lipid Profile: No results for input(s): CHOL, HDL, LDLCALC, TRIG, CHOLHDL, LDLDIRECT in the last 72 hours. Thyroid Function Tests: Recent Labs    12/18/18 0041  TSH 0.790   Anemia Panel: No results for input(s): VITAMINB12, FOLATE, FERRITIN, TIBC, IRON, RETICCTPCT in the last 72 hours. Sepsis Labs: No results for input(s):  PROCALCITON, LATICACIDVEN in the last 168 hours.  No results found for this or any previous visit (from the past 240 hour(s)).       Radiology Studies: Dg Lumbar Spine Complete  Result Date: 12/17/2018 CLINICAL DATA:  Pt sent from PCP for fall yesterday and hit head. Pt also fell on Thursday and Friday last week. Pt denies LOC or taking any blood thinners. Pt having head and middle to lower back pains. Pt reports little nausea but no alterations from her baseline EXAM: LUMBAR SPINE - COMPLETE 4+ VIEW COMPARISON:  CT abdomen pelvis, 07/19/2018 FINDINGS: Previously noted fracture T12 has increased in severity, now severe, with compression primarily of the central and anterior aspect of the vertebral body. No other fractures.  No spondylolisthesis.  No bone lesions. Skeletal structures are diffusely demineralized. Moderate loss of disc height with mild endplate sclerosis at A2-N0. Mild loss of disc height at L3-L4 and L4-L5. There are scattered atherosclerotic calcifications along the abdominal aorta and iliac arteries. Mild focal bulge of the infrarenal abdominal aorta, 2.6 cm. These findings are stable from the prior CT. IMPRESSION: 1. Increase in severity of the compression fracture of T12 when compared to the prior CT. This could be recent. 2. No other fractures. 3. Degenerative changes as described stable from the prior CT. Electronically Signed   By: Lajean Manes M.D.   On: 12/17/2018 18:26   Ct Head Wo Contrast  Result Date: 12/17/2018 CLINICAL DATA:  85 y/o F; fall yesterday with head injury. Head and back pain. EXAM: CT HEAD WITHOUT CONTRAST CT CERVICAL SPINE WITHOUT CONTRAST TECHNIQUE: Multidetector CT imaging of the head and cervical spine was performed following the standard protocol without intravenous contrast. Multiplanar CT image reconstructions of the cervical spine were also generated. COMPARISON:  08/18/2018 CT head and cervical spine. FINDINGS: CT HEAD FINDINGS Brain: No evidence of  acute infarction, hemorrhage, hydrocephalus, extra-axial collection or mass lesion/mass effect. Stable small chronic infarctions within the bilateral anterior basal ganglia. Stable chronic microvascular ischemic changes and volume loss of the brain. Stable small prominent extra-axial space in the left parafalcine frontal region, likely a small underlying arachnoid cyst. Vascular: Calcific atherosclerosis of the carotid siphons and vertebral arteries. No hyperdense vessel identified. Skull: Large right parietal region scalp contusion with hematoma. Sinuses/Orbits: No acute finding. Other: None. CT CERVICAL SPINE FINDINGS Alignment: C5-6 grade 1 retrolisthesis is stable. Skull base and vertebrae: No acute fracture. No primary bone lesion or focal pathologic process. Soft tissues and spinal canal: Right neck mass deep to the parotid tail and posterior to right submandibular gland is increased in size measuring 3.6 x 2.2 cm (AP by ML series 9, image 37). Left deep parotid mass is increased in  size measuring 24 x 20 mm (AP by ML series 9, image 21). Calcific atherosclerosis of the carotid bifurcations and the aortic arch. Disc levels: Cervical spondylosis with prominent discogenic degenerative changes at the C3-4 and C5-6 levels resulting in approximately moderate spinal canal stenosis. Additionally, uncovertebral and facet hypertrophy encroaches on the bilateral neural foramen at multiple levels greatest at the left C4-5 and bilateral C5-6 levels. Upper chest: Negative. Other: Negative. IMPRESSION: CT head: 1. Large right parietal region scalp contusion with hematoma. No calvarial fracture. 2. No acute intracranial abnormality identified. 3. Stable chronic microvascular ischemic changes and volume loss of the brain. 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 CT cervical spine: 1. No acute fracture or dislocation identified. 2. Mild interval increase in size of right neck mass  posterior to right submandibular gland and left deep parotid gland mass. Surgical consultation and/or tissue sampling is recommended if not already acquired. 3. Stable cervical spondylosis greatest at the C3-4 and C5-6 levels. Electronically Signed   By: Kristine Garbe M.D.   On: 12/17/2018 18:51   Ct Cervical Spine Wo Contrast  Result Date: 12/17/2018 CLINICAL DATA:  85 y/o F; fall yesterday with head injury. Head and back pain. EXAM: CT HEAD WITHOUT CONTRAST CT CERVICAL SPINE WITHOUT CONTRAST TECHNIQUE: Multidetector CT imaging of the head and cervical spine was performed following the standard protocol without intravenous contrast. Multiplanar CT image reconstructions of the cervical spine were also generated. COMPARISON:  08/18/2018 CT head and cervical spine. FINDINGS: CT HEAD FINDINGS Brain: No evidence of acute infarction, hemorrhage, hydrocephalus, extra-axial collection or mass lesion/mass effect. Stable small chronic infarctions within the bilateral anterior basal ganglia. Stable chronic microvascular ischemic changes and volume loss of the brain. Stable small prominent extra-axial space in the left parafalcine frontal region, likely a small underlying arachnoid cyst. Vascular: Calcific atherosclerosis of the carotid siphons and vertebral arteries. No hyperdense vessel identified. Skull: Large right parietal region scalp contusion with hematoma. Sinuses/Orbits: No acute finding. Other: None. CT CERVICAL SPINE FINDINGS Alignment: C5-6 grade 1 retrolisthesis is stable. Skull base and vertebrae: No acute fracture. No primary bone lesion or focal pathologic process. Soft tissues and spinal canal: Right neck mass deep to the parotid tail and posterior to right submandibular gland is increased in size measuring 3.6 x 2.2 cm (AP by ML series 9, image 37). Left deep parotid mass is increased in size measuring 24 x 20 mm (AP by ML series 9, image 21). Calcific atherosclerosis of the carotid  bifurcations and the aortic arch. Disc levels: Cervical spondylosis with prominent discogenic degenerative changes at the C3-4 and C5-6 levels resulting in approximately moderate spinal canal stenosis. Additionally, uncovertebral and facet hypertrophy encroaches on the bilateral neural foramen at multiple levels greatest at the left C4-5 and bilateral C5-6 levels. Upper chest: Negative. Other: Negative. IMPRESSION: CT head: 1. Large right parietal region scalp contusion with hematoma. No calvarial fracture. 2. No acute intracranial abnormality identified. 3. Stable chronic microvascular ischemic changes and volume loss of the brain. 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 CT cervical spine: 1. No acute fracture or dislocation identified. 2. Mild interval increase in size of right neck mass posterior to right submandibular gland and left deep parotid gland mass. Surgical consultation and/or tissue sampling is recommended if not already acquired. 3. Stable cervical spondylosis greatest at the C3-4 and C5-6 levels. Electronically Signed   By: Kristine Garbe M.D.   On: 12/17/2018 18:51   Dg Hips Bilat  W Or Wo Pelvis 3-4 Views  Result Date: 12/17/2018 CLINICAL DATA:  Fall. EXAM: DG HIP (WITH OR WITHOUT PELVIS) 3-4V BILAT COMPARISON:  None. FINDINGS: Bones are markedly demineralized. There is abdominal aortic atherosclerosis. SI joints and symphysis pubis unremarkable. No evidence for superior or inferior pubic ramus fracture. AP and frog-leg lateral views of the left hip show no femoral neck fracture. AP and frog-leg lateral views of the right hip show no femoral neck fracture. IMPRESSION: Negative. Electronically Signed   By: Misty Stanley M.D.   On: 12/17/2018 18:25        Scheduled Meds: . lisinopril  40 mg Oral Daily  . pantoprazole  40 mg Oral Daily   Continuous Infusions: . sodium chloride 75 mL/hr at 12/17/18 2039     LOS: 0 days      Georgette Shell, MD [facility]  If 7PM-7AM, please contact night-coverage [email or web address] Password [facility] 12/18/2018, 9:28 AM

## 2018-12-18 NOTE — Care Management Obs Status (Signed)
[place] NOTIFICATION   Patient Details  Name: Barbara Dennis MRN: [id number] Date of Birth: 02-10-1934   Medicare Observation Status Notification Given:  Yes    Lynnell Catalan, RN 12/18/2018, 1:46 PM

## 2018-12-18 NOTE — Progress Notes (Signed)
PT Cancellation Note  Patient Details Name: Barbara Dennis MRN: [id number] DOB: 04/01/1934   Cancelled Treatment:    Reason Eval/Treat Not Completed: Other (comment)(pt using commode, requested PT check back later. Will follow. ) Philomena Doheny PT 12/18/2018  Acute Rehabilitation Services Pager [phone] Office [phone]

## 2018-12-18 NOTE — Progress Notes (Signed)
Patient suffers from hyponatremia, frequent falls which impairs their ability to perform daily activities like walking in the home.  A walker alone will not resolve the issues with performing activities of daily living. A lightweight wheelchair will allow patient to safely perform daily activities.  The patient can self propel in the home or has a caregiver who can provide assistance.     Barbara Dennis PT 12/18/2018  Acute Rehabilitation Services Pager [phone] Office [phone]

## 2018-12-18 NOTE — Progress Notes (Signed)
rm [place], 84, pt has BP of 181/76 HR 73.  ADM d/t fall

## 2018-12-19 ENCOUNTER — Encounter: Payer: Self-pay | Admitting: Gastroenterology

## 2018-12-19 LAB — BASIC METABOLIC PANEL WITH GFR
Anion gap: 7 (ref 5–15)
BUN: 13 mg/dL (ref 8–23)
CO2: 21 mmol/L — ABNORMAL LOW (ref 22–32)
Calcium: 8.9 mg/dL (ref 8.9–10.3)
Chloride: 103 mmol/L (ref 98–111)
Creatinine, Ser: 0.6 mg/dL (ref 0.44–1.00)
GFR calc Af Amer: >60 mL/min
GFR calc non Af Amer: >60 mL/min
Glucose, Bld: 101 mg/dL — ABNORMAL HIGH (ref 70–99)
Potassium: 4.1 mmol/L (ref 3.5–5.1)
Sodium: 131 mmol/L — ABNORMAL LOW (ref 135–145)

## 2018-12-19 MED ORDER — AMLODIPINE BESYLATE 2.5 MG PO TABS: 2.5000 mg | ORAL_TABLET | Freq: Every day | ORAL | 11 refills | Status: DC

## 2018-12-19 MED ORDER — AMLODIPINE BESYLATE 5 MG PO TABS
5.0000 mg | ORAL_TABLET | Freq: Every day | ORAL | Status: DC
  Administered 2018-12-19: 5 mg via ORAL
  Filled 2018-12-19: qty 1

## 2018-12-19 MED ORDER — METOPROLOL TARTRATE 5 MG/5ML IV SOLN: 5.0000 mg | Freq: Four times a day (QID) | INTRAVENOUS | Status: DC | PRN

## 2018-12-19 NOTE — Progress Notes (Signed)
[place], 84yF, BP 185/81, P 84. Received hydralazine 10 mg but not bring it down much will recheck @ 0630.  c/o Headache. Admitted d/t fall.

## 2018-12-19 NOTE — Plan of Care (Signed)
Patient lying in bed this morning. Has had headaches but gets relief with Tylenol. Will continue to monitor.

## 2018-12-19 NOTE — Plan of Care (Signed)
Reviewed discharge instructions with patient; copy given. IV removed. Patient ready for discharge.  

## 2018-12-19 NOTE — Care Management Important Message (Signed)
Important Message  Patient Details  Name: Barbara Dennis MRN: [id number] Date of Birth: December 29, 1933   Medicare Important Message Given:  Yes    Erenest Rasher, RN 12/19/2018, 2:36 PM

## 2018-12-19 NOTE — Care Management Note (Signed)
Case Management Note  Patient Details  Name: Barbara Dennis MRN: [id number] Date of Birth: 1934/09/25  Subjective/Objective:   Admitted with falls,  Hypovolemia hyponatremia                  Action/Plan: Spoke to pt and offered choice for HH/Medicare list given and placed on chart. Pt agreeable to [facility] for HH. Contacted [facility] for [place] and light weight manual wheelchair. Unit RN reached out to family.    Expected Discharge Date:  12/19/18               Expected Discharge Plan:  [place]  In-House Referral:  NA  Discharge planning Services  CM Consult  Post Acute Care Choice:  Home Health Choice offered to:  Patient  DME Arranged:  Youth worker wheelchair with seat cushion DME Agency:  [place]:  PT, Nurse's Aide [place] Agency:  [place]  Status of Service:  Completed, signed off  If discussed at [place] of Stay Meetings, dates discussed:    Additional Comments:  Erenest Rasher, RN 12/19/2018, 1:50 PM

## 2018-12-19 NOTE — Discharge Summary (Addendum)
Physician Discharge Summary  Barbara Dennis OMB:[id number] DOB: 1934-04-09 DOA: 12/17/2018  PCP: Horald Pollen, MD  Admit date: 12/17/2018 Discharge date: 12/19/2018  Admitted From: Home Disposition: Home  Recommendations for Outpatient Follow-up:  1. Follow up with PCP in 1-2 weeks 2. Please obtain BMP/CBC in one week   Home Health: Yes Equipment/Devices: None Discharge Condition stable and improved  CODE STATUS full code Diet recommendation: Regular  Brief/Interim Summary:85 y.o.femalewith medical history significant forfrequent falls, hypertension, COPD, chronic hyponatremia,anemia of chronic disease with baseline hemoglobin of 9.5-11,who was admitted to [facility] long hospital on 12/17/2018 with acute on chronic hyponatremia after presenting from home to the [facility] long emergency department for further evaluation of frequent falls.  The following history is obtained via discussions with the patient via interpreter, discussions with the patient's daughter, discussions with the ED physician, and via chart review.  The patient's daughter conveys that the patient has exhibited recurrent falls for greater than 1 year,typically at a frequency of 1 fall per 1 to 2 weeks. She states that the patient's falls have been mechanical in nature, stating that the patient falls as a result of tripping while ambulating and have not been associated with anyloss of consciousness.The patient experienced 3 such falls over the last 2 to 3 days,with one fall resulting in the patient hitting her head on the floor,in the absence of any associated loss of consciousness. Not associated with any chest pain, palpitations, dizziness, presyncope, syncope, nausea, vomiting, or subjective fever, chills, or rigors. Not associated with any recent change in vision, acute focal weakness, paresthesias, numbness, or vertigo.Patient lives at home with her daughter. At this time, the patient reports mild  soreness over the right parietal lobe,but denies any significant neck discomfort. In the setting of the patient hitting her head as a component of 1 of the free most recent falls, she has brought to [facility] long emergency department for further evaluation.  ED Course:Vital signs in the emergency department were notable for the following: Temperature max 97.6; initial heart rate 92, which decreased to 76 following interval IV fluids, as further described below; blood pressure 143/68-160 8/70; respiratory rate 17-19; and oxygen saturation 83 to 100% on room air.  Labs in the ED were notable for the following: BMP notable for sodium 123 compared to most recent prior value of 131 on 07/23/2018; chloride 92, bicarbonate 23, creatinine 0.77. CBC notable for white blood cell count of 8000, hemoglobin 9, compared to most recent prior value of 9.4 on 07/23/2018.   Noncontrast CT of the head, per final radiology report showed no evidence of intracranial hemorrhage or acute infarct, but rather showed stable small chronic infarctions within the bilateral anterior basal ganglia associated with stable chronic microvascular ischemic changes and associated volume lossof the brain.Noncontrast CT that also showed a right parietal scalp contusion with hematoma. CT cervical spine, per final radiology report showed no evidence of skull fracture, but showed right neck mass deep to the parotid tail and posterior to the right submandibular gland, with slight in full increase in size relative to imaging performed on 08/18/2018. Plain films of the bilateral hips showed no evidence of acute fracture or dislocation.  Discharge Diagnoses:  Principal Problem:   Hyponatremia Active Problems:   Tobacco user   Essential hypertension, benign   Chronic obstructive pulmonary disease (HCC)   Frequent falls   Dehydration   Acute prerenal azotemia   Pressure Injury (Active)     Location:   Location Orientation:   Staging:  Wound Description (Comments):   Present on Admission:   #1 hypovolemic hyponatremia possibly secondary to nutritional intake diminished.  She has chronic hyponatremia her baseline sodium is between 1 27-1 31.  Patient has been receiving normal saline overnight with improvement in her sodium.  Her sodium is 131 today.  Patient was seen by physical therapy and recommended SNF.  #2 frequent falls secondary to generalized deconditioning and weakness.  #3 hypertension she remains elevated in spite of being put back on the home dose of ACE inhibitor.  I have added Norvasc 2.5 mg daily to her home regimen prior to discharge.  4 chronic right neck mass being followed as an outpatient with serial CT scans.  #5 history of COPD and tobacco abuse stable at this time.  #6 history of GERD continue omeprazole  #7 anemia of chronic disease stable   ..durable medical equipment Wheel chair   Estimated body mass index is 16.6 kg/m as calculated from the following:   Height as of this encounter: 5' (1.524 m).   Weight as of this encounter: 38.6 kg.  Discharge Instructions  Discharge Instructions    Diet - low sodium heart healthy   Complete by:  As directed    Increase activity slowly   Complete by:  As directed      Allergies as of 12/19/2018   No Known Allergies     Medication List    STOP taking these medications   bismuth subsalicylate 597 MG chewable tablet Commonly known as:  PEPTO BISMOL   diclofenac sodium 1 % Gel Commonly known as:  VOLTAREN   docusate sodium 100 MG capsule Commonly known as:  COLACE   mirabegron ER 25 MG Tb24 tablet Commonly known as:  MYRBETRIQ   MOVE FREE JOINT HEALTH ADVANCE Tabs   ondansetron 4 MG disintegrating tablet Commonly known as:  ZOFRAN-ODT   polyethylene glycol powder powder Commonly known as:  GLYCOLAX/MIRALAX   traMADol 50 MG tablet Commonly known as:  ULTRAM     TAKE these medications   acetaminophen 500 MG  tablet Commonly known as:  TYLENOL Take 2 tablets (1,000 mg total) by mouth every 8 (eight) hours.   albuterol 108 (90 Base) MCG/ACT inhaler Commonly known as:  PROVENTIL HFA;VENTOLIN HFA Inhale 2 puffs into the lungs every 4 (four) hours as needed for wheezing or shortness of breath.   calcium elemental as carbonate 400 MG chewable tablet Commonly known as:  TUMS ULTRA 1000 Chew 3 tablets (1,200 mg total) by mouth 2 times daily at 12 noon and 4 pm.   lisinopril 40 MG tablet Commonly known as:  PRINIVIL,ZESTRIL Take 1 tablet (40 mg total) by mouth daily.   multivitamin with minerals Tabs tablet Take 1 tablet by mouth daily.   omeprazole 20 MG capsule Commonly known as:  PRILOSEC TAKE 2 CAPSULES BY MOUTH TWICE DAILY BEFORE A MEAL   oxybutynin 10 MG 24 hr tablet Commonly known as:  DITROPAN-XL Take 10 mg by mouth daily.            Durable Medical Equipment  (From admission, onward)         Start     Ordered   12/18/18 1348  For home use only DME lightweight manual wheelchair with seat cushion  Once    Comments:  Patient suffers from weakness which impairs their ability to perform daily activities like walking in the home.  A walker will not resolve  issue with performing activities of daily living. A wheelchair  will allow patient to safely perform daily activities. Patient is not able to propel themselves in the home using a standard weight wheelchair due to weakness. Patient can self propel in the lightweight wheelchair.  Accessories: elevating leg rests (ELRs), wheel locks, extensions and anti-tippers.   12/18/18 1348         Follow-up Information    Horald Pollen, MD Follow up.   Specialty:  Internal Medicine Contact information: [place] [address] [phone]          No Known Allergies  Consultations:  None   Procedures/Studies: Dg Lumbar Spine Complete  Result Date: 12/17/2018 CLINICAL DATA:  Pt sent from PCP for fall  yesterday and hit head. Pt also fell on Thursday and Friday last week. Pt denies LOC or taking any blood thinners. Pt having head and middle to lower back pains. Pt reports little nausea but no alterations from her baseline EXAM: LUMBAR SPINE - COMPLETE 4+ VIEW COMPARISON:  CT abdomen pelvis, 07/19/2018 FINDINGS: Previously noted fracture T12 has increased in severity, now severe, with compression primarily of the central and anterior aspect of the vertebral body. No other fractures.  No spondylolisthesis.  No bone lesions. Skeletal structures are diffusely demineralized. Moderate loss of disc height with mild endplate sclerosis at T0-G2. Mild loss of disc height at L3-L4 and L4-L5. There are scattered atherosclerotic calcifications along the abdominal aorta and iliac arteries. Mild focal bulge of the infrarenal abdominal aorta, 2.6 cm. These findings are stable from the prior CT. IMPRESSION: 1. Increase in severity of the compression fracture of T12 when compared to the prior CT. This could be recent. 2. No other fractures. 3. Degenerative changes as described stable from the prior CT. Electronically Signed   By: Lajean Manes M.D.   On: 12/17/2018 18:26   Ct Head Wo Contrast  Result Date: 12/17/2018 CLINICAL DATA:  85 y/o F; fall yesterday with head injury. Head and back pain. EXAM: CT HEAD WITHOUT CONTRAST CT CERVICAL SPINE WITHOUT CONTRAST TECHNIQUE: Multidetector CT imaging of the head and cervical spine was performed following the standard protocol without intravenous contrast. Multiplanar CT image reconstructions of the cervical spine were also generated. COMPARISON:  08/18/2018 CT head and cervical spine. FINDINGS: CT HEAD FINDINGS Brain: No evidence of acute infarction, hemorrhage, hydrocephalus, extra-axial collection or mass lesion/mass effect. Stable small chronic infarctions within the bilateral anterior basal ganglia. Stable chronic microvascular ischemic changes and volume loss of the brain.  Stable small prominent extra-axial space in the left parafalcine frontal region, likely a small underlying arachnoid cyst. Vascular: Calcific atherosclerosis of the carotid siphons and vertebral arteries. No hyperdense vessel identified. Skull: Large right parietal region scalp contusion with hematoma. Sinuses/Orbits: No acute finding. Other: None. CT CERVICAL SPINE FINDINGS Alignment: C5-6 grade 1 retrolisthesis is stable. Skull base and vertebrae: No acute fracture. No primary bone lesion or focal pathologic process. Soft tissues and spinal canal: Right neck mass deep to the parotid tail and posterior to right submandibular gland is increased in size measuring 3.6 x 2.2 cm (AP by ML series 9, image 37). Left deep parotid mass is increased in size measuring 24 x 20 mm (AP by ML series 9, image 21). Calcific atherosclerosis of the carotid bifurcations and the aortic arch. Disc levels: Cervical spondylosis with prominent discogenic degenerative changes at the C3-4 and C5-6 levels resulting in approximately moderate spinal canal stenosis. Additionally, uncovertebral and facet hypertrophy encroaches on the bilateral neural foramen at multiple levels greatest at the  left C4-5 and bilateral C5-6 levels. Upper chest: Negative. Other: Negative. IMPRESSION: CT head: 1. Large right parietal region scalp contusion with hematoma. No calvarial fracture. 2. No acute intracranial abnormality identified. 3. Stable chronic microvascular ischemic changes and volume loss of the brain. 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 CT cervical spine: 1. No acute fracture or dislocation identified. 2. Mild interval increase in size of right neck mass posterior to right submandibular gland and left deep parotid gland mass. Surgical consultation and/or tissue sampling is recommended if not already acquired. 3. Stable cervical spondylosis greatest at the C3-4 and C5-6 levels. Electronically Signed   By:  Kristine Garbe M.D.   On: 12/17/2018 18:51   Ct Cervical Spine Wo Contrast  Result Date: 12/17/2018 CLINICAL DATA:  85 y/o F; fall yesterday with head injury. Head and back pain. EXAM: CT HEAD WITHOUT CONTRAST CT CERVICAL SPINE WITHOUT CONTRAST TECHNIQUE: Multidetector CT imaging of the head and cervical spine was performed following the standard protocol without intravenous contrast. Multiplanar CT image reconstructions of the cervical spine were also generated. COMPARISON:  08/18/2018 CT head and cervical spine. FINDINGS: CT HEAD FINDINGS Brain: No evidence of acute infarction, hemorrhage, hydrocephalus, extra-axial collection or mass lesion/mass effect. Stable small chronic infarctions within the bilateral anterior basal ganglia. Stable chronic microvascular ischemic changes and volume loss of the brain. Stable small prominent extra-axial space in the left parafalcine frontal region, likely a small underlying arachnoid cyst. Vascular: Calcific atherosclerosis of the carotid siphons and vertebral arteries. No hyperdense vessel identified. Skull: Large right parietal region scalp contusion with hematoma. Sinuses/Orbits: No acute finding. Other: None. CT CERVICAL SPINE FINDINGS Alignment: C5-6 grade 1 retrolisthesis is stable. Skull base and vertebrae: No acute fracture. No primary bone lesion or focal pathologic process. Soft tissues and spinal canal: Right neck mass deep to the parotid tail and posterior to right submandibular gland is increased in size measuring 3.6 x 2.2 cm (AP by ML series 9, image 37). Left deep parotid mass is increased in size measuring 24 x 20 mm (AP by ML series 9, image 21). Calcific atherosclerosis of the carotid bifurcations and the aortic arch. Disc levels: Cervical spondylosis with prominent discogenic degenerative changes at the C3-4 and C5-6 levels resulting in approximately moderate spinal canal stenosis. Additionally, uncovertebral and facet hypertrophy encroaches on  the bilateral neural foramen at multiple levels greatest at the left C4-5 and bilateral C5-6 levels. Upper chest: Negative. Other: Negative. IMPRESSION: CT head: 1. Large right parietal region scalp contusion with hematoma. No calvarial fracture. 2. No acute intracranial abnormality identified. 3. Stable chronic microvascular ischemic changes and volume loss of the brain. 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 CT cervical spine: 1. No acute fracture or dislocation identified. 2. Mild interval increase in size of right neck mass posterior to right submandibular gland and left deep parotid gland mass. Surgical consultation and/or tissue sampling is recommended if not already acquired. 3. Stable cervical spondylosis greatest at the C3-4 and C5-6 levels. Electronically Signed   By: Kristine Garbe M.D.   On: 12/17/2018 18:51   Dg Hips Bilat W Or Wo Pelvis 3-4 Views  Result Date: 12/17/2018 CLINICAL DATA:  Fall. EXAM: DG HIP (WITH OR WITHOUT PELVIS) 3-4V BILAT COMPARISON:  None. FINDINGS: Bones are markedly demineralized. There is abdominal aortic atherosclerosis. SI joints and symphysis pubis unremarkable. No evidence for superior or inferior pubic ramus fracture. AP and frog-leg lateral views of the left hip show no  femoral neck fracture. AP and frog-leg lateral views of the right hip show no femoral neck fracture. IMPRESSION: Negative. Electronically Signed   By: Misty Stanley M.D.   On: 12/17/2018 18:25   (Echo, Carotid, EGD, Colonoscopy, ERCP)    Subjective: Resting in bed awake alert denies any complaints of chest pain shortness of breath nausea vomiting diarrhea constipation  Discharge Exam: Vitals:   12/19/18 0525 12/19/18 0654  BP: (!) 185/71 (!) 167/68  Pulse: 80 78  Resp: 14   Temp: 98 F (36.7 C)   SpO2: 97%    Vitals:   12/18/18 2200 12/19/18 0500 12/19/18 0525 12/19/18 0654  BP: (!) 174/71  (!) 185/71 (!) 167/68  Pulse: 65  80 78   Resp:   14   Temp:   98 F (36.7 C)   TempSrc:   Oral   SpO2: 100%  97%   Weight:  38.6 kg    Height:        General: Pt is alert, awake, not in acute distress Cardiovascular: RRR, S1/S2 +, no rubs, no gallops Respiratory: CTA bilaterally, no wheezing, no rhonchi Abdominal: Soft, NT, ND, bowel sounds + Extremities: no edema, no cyanosis    The results of significant diagnostics from this hospitalization (including imaging, microbiology, ancillary and laboratory) are listed below for reference.     Microbiology: No results found for this or any previous visit (from the past 240 hour(s)).   Labs: BNP (last 3 results) No results for input(s): BNP in the last 8760 hours. Basic Metabolic Panel: Recent Labs  Lab 12/17/18 1901 12/18/18 0041 12/19/18 0605  NA 123* 127* 131*  K 4.4 4.2 4.1  CL 92* 98 103  CO2 23 23 21*  GLUCOSE 93 103* 101*  BUN 21 17 13   CREATININE 0.77 0.69 0.60  CALCIUM 8.9 8.7* 8.9  MG  --  2.0  --    Liver Function Tests: No results for input(s): AST, ALT, ALKPHOS, BILITOT, PROT, ALBUMIN in the last 168 hours. No results for input(s): LIPASE, AMYLASE in the last 168 hours. No results for input(s): AMMONIA in the last 168 hours. CBC: Recent Labs  Lab 12/17/18 1901 12/18/18 0041  WBC 8.1 7.3  NEUTROABS 5.2  --   HGB 9.0* 9.5*  HCT 26.7* 29.2*  MCV 95.0 97.3  PLT 218 259   Cardiac Enzymes: No results for input(s): CKTOTAL, CKMB, CKMBINDEX, TROPONINI in the last 168 hours. BNP: Invalid input(s): POCBNP CBG: No results for input(s): GLUCAP in the last 168 hours. D-Dimer No results for input(s): DDIMER in the last 72 hours. Hgb A1c No results for input(s): HGBA1C in the last 72 hours. Lipid Profile No results for input(s): CHOL, HDL, LDLCALC, TRIG, CHOLHDL, LDLDIRECT in the last 72 hours. Thyroid function studies Recent Labs    12/18/18 0041  TSH 0.790   Anemia work up No results for input(s): VITAMINB12, FOLATE, FERRITIN, TIBC,  IRON, RETICCTPCT in the last 72 hours. Urinalysis    Component Value Date/Time   COLORURINE STRAW (A) 12/17/2018 2323   APPEARANCEUR CLEAR 12/17/2018 2323   LABSPEC 1.005 12/17/2018 2323   PHURINE 7.0 12/17/2018 2323   GLUCOSEU NEGATIVE 12/17/2018 2323   HGBUR NEGATIVE 12/17/2018 2323   BILIRUBINUR NEGATIVE 12/17/2018 2323   BILIRUBINUR negative 09/28/2018 1151   BILIRUBINUR negative 05/01/2017 1447   KETONESUR NEGATIVE 12/17/2018 2323   PROTEINUR NEGATIVE 12/17/2018 2323   UROBILINOGEN 0.2 09/28/2018 1151   NITRITE NEGATIVE 12/17/2018 2323   LEUKOCYTESUR NEGATIVE 12/17/2018 2323  Sepsis Labs Invalid input(s): PROCALCITONIN,  WBC,  LACTICIDVEN Microbiology No results found for this or any previous visit (from the past 240 hour(s)).   Time coordinating discharge: 33  minutes  SIGNED:   Georgette Shell, MD  [facility] 12/19/2018, 10:34 AM Pager   If 7PM-7AM, please contact night-coverage [email or web address] Password [email or web address]

## 2018-12-21 DIAGNOSIS — W19XXXD Unspecified fall, subsequent encounter: Secondary | ICD-10-CM | POA: Diagnosis not present

## 2018-12-21 DIAGNOSIS — M810 Age-related osteoporosis without current pathological fracture: Secondary | ICD-10-CM | POA: Diagnosis not present

## 2018-12-21 DIAGNOSIS — M47812 Spondylosis without myelopathy or radiculopathy, cervical region: Secondary | ICD-10-CM | POA: Diagnosis not present

## 2018-12-21 DIAGNOSIS — M4802 Spinal stenosis, cervical region: Secondary | ICD-10-CM | POA: Diagnosis not present

## 2018-12-21 DIAGNOSIS — S0003XD Contusion of scalp, subsequent encounter: Secondary | ICD-10-CM | POA: Diagnosis not present

## 2018-12-21 DIAGNOSIS — K219 Gastro-esophageal reflux disease without esophagitis: Secondary | ICD-10-CM | POA: Diagnosis not present

## 2018-12-21 DIAGNOSIS — I700 Atherosclerosis of aorta: Secondary | ICD-10-CM | POA: Diagnosis not present

## 2018-12-21 DIAGNOSIS — E871 Hypo-osmolality and hyponatremia: Secondary | ICD-10-CM | POA: Diagnosis not present

## 2018-12-21 DIAGNOSIS — M5031 Other cervical disc degeneration,  high cervical region: Secondary | ICD-10-CM | POA: Diagnosis not present

## 2018-12-21 DIAGNOSIS — Z9181 History of falling: Secondary | ICD-10-CM | POA: Diagnosis not present

## 2018-12-21 DIAGNOSIS — D638 Anemia in other chronic diseases classified elsewhere: Secondary | ICD-10-CM | POA: Diagnosis not present

## 2018-12-21 DIAGNOSIS — S22080D Wedge compression fracture of T11-T12 vertebra, subsequent encounter for fracture with routine healing: Secondary | ICD-10-CM | POA: Diagnosis not present

## 2018-12-21 DIAGNOSIS — I10 Essential (primary) hypertension: Secondary | ICD-10-CM | POA: Diagnosis not present

## 2018-12-21 DIAGNOSIS — J449 Chronic obstructive pulmonary disease, unspecified: Secondary | ICD-10-CM | POA: Diagnosis not present

## 2018-12-30 ENCOUNTER — Telehealth: Payer: Self-pay | Admitting: Emergency Medicine

## 2018-12-30 NOTE — Telephone Encounter (Signed)
Called and gave verbal orders for [facility] for pt to [employer].

## 2018-12-30 NOTE — Telephone Encounter (Signed)
Copied from [place] [id number]. Topic: Quick Communication - Home Health Verbal Orders >> Dec 30, 2018 12:40 PM Scherrie Gerlach wrote: Caller/Agency: [facility]// [place] Callback Number: [phone] Requesting  verbal orders nursing Frequency: 2 wk 1 1 wk 2  Start was on 3/2.  She thought someone had called for the orders.  They cannot do anything else until orders given

## 2018-12-31 ENCOUNTER — Telehealth: Payer: Self-pay | Admitting: *Deleted

## 2018-12-31 NOTE — Telephone Encounter (Signed)
Faxed signed Home Health Certification and Plan of Care order. Confirmation page received at 8:19 am.

## 2019-01-17 DIAGNOSIS — K2970 Gastritis, unspecified, without bleeding: Secondary | ICD-10-CM | POA: Diagnosis not present

## 2019-01-17 DIAGNOSIS — N3281 Overactive bladder: Secondary | ICD-10-CM | POA: Diagnosis not present

## 2019-01-17 DIAGNOSIS — S22080A Wedge compression fracture of T11-T12 vertebra, initial encounter for closed fracture: Secondary | ICD-10-CM | POA: Diagnosis not present

## 2019-01-17 DIAGNOSIS — M810 Age-related osteoporosis without current pathological fracture: Secondary | ICD-10-CM | POA: Diagnosis not present

## 2019-01-18 ENCOUNTER — Telehealth (INDEPENDENT_AMBULATORY_CARE_PROVIDER_SITE_OTHER): Payer: Medicare Other | Admitting: Emergency Medicine

## 2019-01-18 ENCOUNTER — Other Ambulatory Visit: Payer: Self-pay

## 2019-01-18 ENCOUNTER — Ambulatory Visit: Payer: Medicare Other | Admitting: Emergency Medicine

## 2019-01-18 DIAGNOSIS — R221 Localized swelling, mass and lump, neck: Secondary | ICD-10-CM

## 2019-01-18 DIAGNOSIS — M545 Low back pain: Secondary | ICD-10-CM | POA: Diagnosis not present

## 2019-01-18 DIAGNOSIS — G894 Chronic pain syndrome: Secondary | ICD-10-CM

## 2019-01-18 DIAGNOSIS — G8929 Other chronic pain: Secondary | ICD-10-CM | POA: Diagnosis not present

## 2019-01-18 DIAGNOSIS — I10 Essential (primary) hypertension: Secondary | ICD-10-CM

## 2019-01-18 DIAGNOSIS — N3281 Overactive bladder: Secondary | ICD-10-CM

## 2019-01-18 DIAGNOSIS — J449 Chronic obstructive pulmonary disease, unspecified: Secondary | ICD-10-CM

## 2019-01-18 NOTE — Progress Notes (Signed)
Telemedicine Encounter- SOAP NOTE Established Patient  This telephone encounter was conducted with the patient's (or proxy's) verbal consent via audio telecommunications: yes/no: Yes Patient was instructed to have this encounter in a suitably private space; and to only have persons present to whom they give permission to participate. In addition, patient identity was confirmed by use of name plus two identifiers (DOB and address).  I discussed the limitations, risks, security and privacy concerns of performing an evaluation and management service by telephone and the availability of in person appointments. I also discussed with the patient that there may be a patient responsible charge related to this service. The patient expressed understanding and agreed to proceed.  I spent a total of TIME; 0 MIN TO 60 MIN: 20 minutes talking with the patient or their proxy.  No chief complaint on file.   Subjective   Barbara Dennis is a 85 y.o. female established patient. Telephone visit today for follow-up on chronic medical problems. Has history of hypertension, COPD still smoking, chronic low back pain with chronic pain syndrome, overactive bladder, osteoporosis, and frequent falls.  Seen by me last month and sent to the hospital after she sustained head injury secondary to a fall.  Admitted for 2 days.  CT scan showed a large scalp hematoma but no intracranial bleed or fracture.  CT scan of neck showed right neck mass mildly increase in size from last years CT.  History obtained from daughter using a [place] interpreter.  Patient doing better.  No complaints today.  IMPRESSION: CT head:  1. Large right parietal region scalp contusion with hematoma. No calvarial fracture. 2. No acute intracranial abnormality identified. 3. Stable chronic microvascular ischemic changes and volume loss of the brain. 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  CT cervical  spine:  1. No acute fracture or dislocation identified. 2. Mild interval increase in size of right neck mass posterior to right submandibular gland and left deep parotid gland mass. Surgical consultation and/or tissue sampling is recommended if not already acquired. 3. Stable cervical spondylosis greatest at the C3-4 and C5-6 levels.   Electronically Signed   By: Kristine Garbe M.D.   On: 12/17/2018 18:51 HPI   Patient Active Problem List   Diagnosis Date Noted  . Frequent falls 12/17/2018  . Pedal edema 10/21/2018  . Osteoporosis 09/12/2018  . Overactive bladder 08/29/2018  . O2 dependent 08/11/2018  . Hyponatremia 08/02/2018  . Gastroesophageal reflux disease 07/16/2018  . Chronic pain syndrome 01/07/2018  . Chronic obstructive pulmonary disease ([place]) 08/20/2017  . Multinodular goiter 08/15/2017  . Bilateral leg pain 07/15/2017  . Chronic bilateral low back pain without sciatica 07/15/2017  . Essential hypertension, benign 07/15/2017  . Hypercalcemia 07/15/2017  . Depression with anxiety 01/06/2015  . 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 DJD (degenerative joint disease) 02/16/2013  . Tobacco user 09/23/2012  . Menopause 08/21/2012  . Postmenopausal atrophic vaginitis 08/21/2012    Past Medical History:  Diagnosis Date  . Anemia of chronic disease   . GERD (gastroesophageal reflux disease)   . Hypertension   . Mass of right side of neck   . Urinary incontinence     Current Outpatient Medications  Medication Sig Dispense Refill  . acetaminophen (TYLENOL) 500 MG tablet Take 2 tablets (1,000 mg total) by mouth every 8 (eight) hours. 30 tablet 0  . amLODipine (NORVASC) 2.5 MG tablet Take 1 tablet (2.5 mg total) by mouth daily. 30 tablet 11  .  calcium elemental as carbonate (TUMS ULTRA 1000) 400 MG chewable tablet Chew 3 tablets (1,200 mg total) by mouth 2 times daily at 12 noon and 4 pm. 60 tablet 0  . lisinopril (PRINIVIL,ZESTRIL) 40 MG tablet  Take 1 tablet (40 mg total) by mouth daily. 90 tablet 3  . Multiple Vitamin (MULTIVITAMIN WITH MINERALS) TABS tablet Take 1 tablet by mouth daily. 30 tablet 0  . omeprazole (PRILOSEC) 20 MG capsule TAKE 2 CAPSULES BY MOUTH TWICE DAILY BEFORE A MEAL 60 capsule 0  . oxybutynin (DITROPAN-XL) 10 MG 24 hr tablet Take 10 mg by mouth daily.    Marland Kitchen albuterol (PROVENTIL HFA;VENTOLIN HFA) 108 (90 Base) MCG/ACT inhaler Inhale 2 puffs into the lungs every 4 (four) hours as needed for wheezing or shortness of breath. (Patient not taking: Reported on 01/18/2019) 1 Inhaler 0   No current facility-administered medications for this visit.     No Known Allergies  Social History   Socioeconomic History  . Marital status: Widowed    Spouse name: Not on file  . Number of children: Not on file  . Years of education: Not on file  . Highest education level: Not on file  Occupational History  . Not on file  Social Needs  . Financial resource strain: Not on file  . Food insecurity:    Worry: Not on file    Inability: Not on file  . Transportation needs:    Medical: Not on file    Non-medical: Not on file  Tobacco Use  . Smoking status: Current Every Day Smoker    Packs/day: 0.70    Years: 60.00    Pack years: 42.00    Types: Cigarettes  . Smokeless tobacco: Never Used  Substance and Sexual Activity  . Alcohol use: No    Alcohol/week: 0.0 standard drinks  . Drug use: No  . Sexual activity: Not on file  Lifestyle  . Physical activity:    Days per week: Not on file    Minutes per session: Not on file  . Stress: Not on file  Relationships  . Social connections:    Talks on phone: Not on file    Gets together: Not on file    Attends religious service: Not on file    Active member of club or organization: Not on file    Attends meetings of clubs or organizations: Not on file    Relationship status: Not on file  . Intimate partner violence:    Fear of current or ex partner: Not on file     Emotionally abused: Not on file    Physically abused: Not on file    Forced sexual activity: Not on file  Other Topics Concern  . Not on file  Social History Narrative   Marital status: widowed 28 years ago.  From [place]; moved to [place] 1974.     Children: none       Lives:  Alone; brother in law is Psychologist, sport and exercise who is 28.      Employment: retired.       Tobacco:  1/2 ppd       Alcohol:  None      Exercise:  Walking daily.      ADLs: no driving.    Review of Systems  Constitutional: Negative for chills and fever.  HENT: Negative for congestion and sore throat.   Eyes: Negative for discharge and redness.  Respiratory: Positive for cough. Negative for sputum production, shortness of breath and wheezing.  Cardiovascular: Negative for chest pain.  Gastrointestinal: Negative for abdominal pain, diarrhea, nausea and vomiting.  Musculoskeletal: Positive for back pain.  Neurological: Negative for dizziness and headaches.  All other systems reviewed and are negative.   Objective   Vitals as reported by the patient: There were no vitals filed for this visit.  There are no diagnoses linked to this encounter.  Diagnoses and all orders for this visit:  Neck mass -     Ambulatory referral to General Surgery -     Ambulatory referral to ENT  Chronic pain syndrome  Overactive bladder  Chronic bilateral low back pain without sciatica  Chronic obstructive pulmonary disease, unspecified COPD type ([place])  Essential hypertension, benign    I discussed the assessment and treatment plan with the patient. The patient was provided an opportunity to ask questions and all were answered. The patient agreed with the plan and demonstrated an understanding of the instructions.   The patient was advised to call back or seek an in-person evaluation if the symptoms worsen or if the condition fails to improve as anticipated.  I provided 20 minutes of non-face-to-face time during this encounter.   Horald Pollen, MD  Primary Care at [facility]

## 2019-02-17 DIAGNOSIS — S22080A Wedge compression fracture of T11-T12 vertebra, initial encounter for closed fracture: Secondary | ICD-10-CM | POA: Diagnosis not present

## 2019-02-17 DIAGNOSIS — K2970 Gastritis, unspecified, without bleeding: Secondary | ICD-10-CM | POA: Diagnosis not present

## 2019-02-17 DIAGNOSIS — M810 Age-related osteoporosis without current pathological fracture: Secondary | ICD-10-CM | POA: Diagnosis not present

## 2019-02-17 DIAGNOSIS — N3281 Overactive bladder: Secondary | ICD-10-CM | POA: Diagnosis not present

## 2019-03-18 ENCOUNTER — Other Ambulatory Visit: Payer: Self-pay

## 2019-03-19 DIAGNOSIS — N3281 Overactive bladder: Secondary | ICD-10-CM | POA: Diagnosis not present

## 2019-03-19 DIAGNOSIS — M810 Age-related osteoporosis without current pathological fracture: Secondary | ICD-10-CM | POA: Diagnosis not present

## 2019-03-19 DIAGNOSIS — K2970 Gastritis, unspecified, without bleeding: Secondary | ICD-10-CM | POA: Diagnosis not present

## 2019-03-19 DIAGNOSIS — S22080A Wedge compression fracture of T11-T12 vertebra, initial encounter for closed fracture: Secondary | ICD-10-CM | POA: Diagnosis not present

## 2019-03-22 ENCOUNTER — Encounter: Payer: Self-pay | Admitting: Endocrinology

## 2019-03-22 ENCOUNTER — Other Ambulatory Visit: Payer: Self-pay

## 2019-03-22 ENCOUNTER — Ambulatory Visit (INDEPENDENT_AMBULATORY_CARE_PROVIDER_SITE_OTHER): Payer: Medicare Other | Admitting: Endocrinology

## 2019-03-22 VITALS — BP 138/62 | HR 81 | Temp 97.7°F | Wt 99.0 lb

## 2019-03-22 DIAGNOSIS — M810 Age-related osteoporosis without current pathological fracture: Secondary | ICD-10-CM | POA: Diagnosis not present

## 2019-03-22 DIAGNOSIS — Z9181 History of falling: Secondary | ICD-10-CM

## 2019-03-22 DIAGNOSIS — L299 Pruritus, unspecified: Secondary | ICD-10-CM | POA: Diagnosis not present

## 2019-03-22 MED ORDER — TRIAMCINOLONE ACETONIDE 0.5 % EX OINT: 1.0000 "application " | TOPICAL_OINTMENT | Freq: Four times a day (QID) | CUTANEOUS | 11 refills | Status: DC

## 2019-03-22 MED ORDER — IBANDRONATE SODIUM 150 MG PO TABS: 150.0000 mg | ORAL_TABLET | ORAL | 3 refills | Status: AC

## 2019-03-22 NOTE — Patient Instructions (Addendum)
It is critically important to prevent falling down (keep floor areas well-lit, dry, and free of loose objects.  If you have a cane, walker, or wheelchair, you should use it, even for short trips around the house.  Wear flat-soled shoes.  Also, try not to rush) Take calcium 1200 mg per day, and vitamin-D, 400 units per day Please continue the same Ibandronate Please see the specialist about the lump on your neck. I have sent a prescription to your pharmacy, for a skin cream to stop the itching.  Please call Dr Mitchel Honour, if the itching persists Please come back for a follow-up appointment in 4-5 months.    ???? ?? ???? ?? ?? ????? (??? ?? ???? ??? ????? ??? ??????. ???, ??? ?? ?????? ?? ? ??? ?? ?? ? ??? ???????. ??? ?? ???? ????) ??? 1200mg ? ??? ??? 400 ??? ??? -D? ?????? ?? ?? ?? ??????. ?? ???? ???? ????? ?????? ?? ??? ????? ?? ?? ??? ???? ?????. ????? ???? ??? ?? ???? ?????? 4-5 ?? ?? ?? ??? ?? ?? ????.

## 2019-03-22 NOTE — Progress Notes (Signed)
Subjective:    Patient ID: Barbara Dennis, female    DOB: [birth year], 85 y.o.   MRN: [id number]  HPI Pt returns for f/u of osteoporosis: Dx'ed: 2019 Secondary cause: smoking (quit 2019) Fractures: ribs (2019), with a fall Past rx: none Current rx: Boniva.   Last DEXA result: (2019) worst T-score is -6 (DR).   Other: ins declined Reclast; she was seen in ER 2020, with a fall and head contusion.   Interval hx: pt states few weeks of moderate rash throughout the body, and assoc itching.  She tolerates boniva well.  Past Medical History:  Diagnosis Date  . Anemia of chronic disease   . GERD (gastroesophageal reflux disease)   . Hypertension   . Mass of right side of neck   . Urinary incontinence     Past Surgical History:  Procedure Laterality Date  . APPENDECTOMY    . BIOPSY  08/24/2018   Procedure: BIOPSY;  Surgeon: Thornton Park, MD;  Location: [facility];  Service: Gastroenterology;;  . ESOPHAGOGASTRODUODENOSCOPY (EGD) WITH PROPOFOL N/A 08/24/2018   Procedure: ESOPHAGOGASTRODUODENOSCOPY (EGD) WITH PROPOFOL;  Surgeon: Thornton Park, MD;  Location: [facility];  Service: Gastroenterology;  Laterality: N/A;    Social History   Socioeconomic History  . Marital status: Widowed    Spouse name: Not on file  . Number of children: Not on file  . Years of education: Not on file  . Highest education level: Not on file  Occupational History  . Not on file  Social Needs  . Financial resource strain: Not on file  . Food insecurity:    Worry: Not on file    Inability: Not on file  . Transportation needs:    Medical: Not on file    Non-medical: Not on file  Tobacco Use  . Smoking status: Current Every Day Smoker    Packs/day: 0.70    Years: 60.00    Pack years: 42.00    Types: Cigarettes  . Smokeless tobacco: Never Used  Substance and Sexual Activity  . Alcohol use: No    Alcohol/week: 0.0 standard drinks  . Drug use: No  . Sexual activity: Not on file   Lifestyle  . Physical activity:    Days per week: Not on file    Minutes per session: Not on file  . Stress: Not on file  Relationships  . Social connections:    Talks on phone: Not on file    Gets together: Not on file    Attends religious service: Not on file    Active member of club or organization: Not on file    Attends meetings of clubs or organizations: Not on file    Relationship status: Not on file  . Intimate partner violence:    Fear of current or ex partner: Not on file    Emotionally abused: Not on file    Physically abused: Not on file    Forced sexual activity: Not on file  Other Topics Concern  . Not on file  Social History Narrative   Marital status: widowed 28 years ago.  From [place]; moved to [place] 1974.     Children: none       Lives:  Alone; brother in law is Psychologist, sport and exercise who is 21.      Employment: retired.       Tobacco:  1/2 ppd       Alcohol:  None      Exercise:  Walking daily.  ADLs: no driving.    Current Outpatient Medications on File Prior to Visit  Medication Sig Dispense Refill  . acetaminophen (TYLENOL) 500 MG tablet Take 2 tablets (1,000 mg total) by mouth every 8 (eight) hours. 30 tablet 0  . albuterol (PROVENTIL HFA;VENTOLIN HFA) 108 (90 Base) MCG/ACT inhaler Inhale 2 puffs into the lungs every 4 (four) hours as needed for wheezing or shortness of breath. 1 Inhaler 0  . amLODipine (NORVASC) 2.5 MG tablet Take 1 tablet (2.5 mg total) by mouth daily. 30 tablet 11  . calcium elemental as carbonate (TUMS ULTRA 1000) 400 MG chewable tablet Chew 3 tablets (1,200 mg total) by mouth 2 times daily at 12 noon and 4 pm. 60 tablet 0  . lisinopril (PRINIVIL,ZESTRIL) 40 MG tablet Take 1 tablet (40 mg total) by mouth daily. 90 tablet 3  . Multiple Vitamin (MULTIVITAMIN WITH MINERALS) TABS tablet Take 1 tablet by mouth daily. 30 tablet 0  . omeprazole (PRILOSEC) 20 MG capsule TAKE 2 CAPSULES BY MOUTH TWICE DAILY BEFORE A MEAL 60 capsule 0  . oxybutynin  (DITROPAN-XL) 10 MG 24 hr tablet Take 10 mg by mouth daily.     No current facility-administered medications on file prior to visit.     No Known Allergies  Family History  Problem Relation Age of Onset  . Hypercalcemia Neg Hx   . Osteoporosis Neg Hx     BP 138/62 (BP Location: Right Arm, Patient Position: Sitting, Cuff Size: Normal)   Pulse 81   Temp 97.7 F (36.5 C) (Oral)   Wt 99 lb (44.9 kg)   SpO2 98%   BMI 19.33 kg/m    Review of Systems Denies LOC.      Objective:   Physical Exam VITAL SIGNS:  See vs page.   GENERAL: no distress.  Skin: mild diffuse erythematous rash.   GAIT: steady with a walker.     Assessment & Plan:  Itching, new.  Usually due to CD Osteoporosis: on rx Head contusion.  We discussed anti-fall measures.  Patient Instructions  It is critically important to prevent falling down (keep floor areas well-lit, dry, and free of loose objects.  If you have a cane, walker, or wheelchair, you should use it, even for short trips around the house.  Wear flat-soled shoes.  Also, try not to rush) Take calcium 1200 mg per day, and vitamin-D, 400 units per day Please continue the same Ibandronate Please see the specialist about the lump on your neck. I have sent a prescription to your pharmacy, for a skin cream to stop the itching.  Please call Dr Mitchel Honour, if the itching persists Please come back for a follow-up appointment in 4-5 months.    ???? ?? ???? ?? ?? ????? (??? ?? ???? ??? ????? ??? ??????. ???, ??? ?? ?????? ?? ? ??? ?? ?? ? ??? ???????. ??? ?? ???? ????) ??? 1200mg ? ??? ??? 400 ??? ??? -D? ?????? ?? ?? ?? ??????. ?? ???? ???? ????? ?????? ?? ??? ????? ?? ?? ??? ???? ?????. ????? ???? ??? ?? ???? ?????? 4-5 ?? ?? ?? ??? ?? ?? ????.

## 2019-04-19 DIAGNOSIS — M810 Age-related osteoporosis without current pathological fracture: Secondary | ICD-10-CM | POA: Diagnosis not present

## 2019-04-19 DIAGNOSIS — N3281 Overactive bladder: Secondary | ICD-10-CM | POA: Diagnosis not present

## 2019-04-19 DIAGNOSIS — S22080A Wedge compression fracture of T11-T12 vertebra, initial encounter for closed fracture: Secondary | ICD-10-CM | POA: Diagnosis not present

## 2019-04-19 DIAGNOSIS — K2970 Gastritis, unspecified, without bleeding: Secondary | ICD-10-CM | POA: Diagnosis not present

## 2019-04-30 ENCOUNTER — Other Ambulatory Visit (HOSPITAL_COMMUNITY): Payer: Self-pay | Admitting: Otolaryngology

## 2019-04-30 DIAGNOSIS — D487 Neoplasm of uncertain behavior of other specified sites: Secondary | ICD-10-CM | POA: Diagnosis not present

## 2019-04-30 DIAGNOSIS — D37030 Neoplasm of uncertain behavior of the parotid salivary glands: Secondary | ICD-10-CM | POA: Diagnosis not present

## 2019-04-30 DIAGNOSIS — H6123 Impacted cerumen, bilateral: Secondary | ICD-10-CM | POA: Diagnosis not present

## 2019-04-30 DIAGNOSIS — R221 Localized swelling, mass and lump, neck: Secondary | ICD-10-CM

## 2019-05-06 ENCOUNTER — Encounter: Payer: Self-pay | Admitting: Emergency Medicine

## 2019-05-06 ENCOUNTER — Telehealth (INDEPENDENT_AMBULATORY_CARE_PROVIDER_SITE_OTHER): Payer: Medicare Other | Admitting: Emergency Medicine

## 2019-05-06 DIAGNOSIS — W19XXXA Unspecified fall, initial encounter: Secondary | ICD-10-CM | POA: Diagnosis not present

## 2019-05-06 DIAGNOSIS — S40021A Contusion of right upper arm, initial encounter: Secondary | ICD-10-CM

## 2019-05-06 NOTE — Progress Notes (Signed)
Arm pain right side.  Left eye hurts.

## 2019-05-06 NOTE — Progress Notes (Signed)
Telemedicine Encounter- SOAP NOTE Established Patient  This telephone encounter was conducted with the patient's (or proxy's) verbal consent via audio telecommunications: yes/no: Yes Patient was instructed to have this encounter in a suitably private space; and to only have persons present to whom they give permission to participate. In addition, patient identity was confirmed by use of name plus two identifiers (DOB and address).  I discussed the limitations, risks, security and privacy concerns of performing an evaluation and management service by telephone and the availability of in person appointments. I also discussed with the patient that there may be a patient responsible charge related to this service. The patient expressed understanding and agreed to proceed.  I spent a total of TIME; 0 MIN TO 60 MIN: 30 minutes talking with the patient or their proxy.  No chief complaint on file. Right arm pain  Subjective   Barbara Dennis is a 85 y.o. female established patient. Telephone visit today for evaluation of right arm injury. Using [place] interpreter Dionne Milo [phone].  Spoke to family member who always accompanies Barbara Dennis to her visits.  I know her very well.  Barbara Dennis fell 2 weeks ago and sustained injury to her right arm.  She normally uses a walker but as she let go to hold a glass of water lost her balance and fell down.  Arm is bruised but she is able to fully use it.  Today is better than several days ago.  No deformities.  No head injuries.  No other injuries. Also states that the caregiver noticed that patient was closing her left eye frequently asked if she was in pain.  There is no redness or bruising around the eye and patient states that she can see normal. Also states that they went to see the ENT specialist as referred for evaluation of neck mass.  They were told she needed a biopsy but it has not been scheduled yet.  They are still waiting for call to schedule.  HPI    Patient Active Problem List   Diagnosis Date Noted  . Frequent falls 12/17/2018  . Pedal edema 10/21/2018  . Osteoporosis 09/12/2018  . Overactive bladder 08/29/2018  . O2 dependent 08/11/2018  . Hyponatremia 08/02/2018  . Gastroesophageal reflux disease 07/16/2018  . Chronic pain syndrome 01/07/2018  . Chronic obstructive pulmonary disease ([place]) 08/20/2017  . Multinodular goiter 08/15/2017  . Bilateral leg pain 07/15/2017  . Chronic bilateral low back pain without sciatica 07/15/2017  . Essential hypertension, benign 07/15/2017  . Hypercalcemia 07/15/2017  . Depression with anxiety 01/06/2015  . 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 DJD (degenerative joint disease) 02/16/2013  . Tobacco user 09/23/2012  . Menopause 08/21/2012  . Postmenopausal atrophic vaginitis 08/21/2012    Past Medical History:  Diagnosis Date  . Anemia of chronic disease   . GERD (gastroesophageal reflux disease)   . Hypertension   . Mass of right side of neck   . Urinary incontinence     Current Outpatient Medications  Medication Sig Dispense Refill  . acetaminophen (TYLENOL) 500 MG tablet Take 2 tablets (1,000 mg total) by mouth every 8 (eight) hours. 30 tablet 0  . albuterol (PROVENTIL HFA;VENTOLIN HFA) 108 (90 Base) MCG/ACT inhaler Inhale 2 puffs into the lungs every 4 (four) hours as needed for wheezing or shortness of breath. 1 Inhaler 0  . amLODipine (NORVASC) 2.5 MG tablet Take 1 tablet (2.5 mg total) by mouth daily. 30 tablet 11  . calcium elemental  as carbonate (TUMS ULTRA 1000) 400 MG chewable tablet Chew 3 tablets (1,200 mg total) by mouth 2 times daily at 12 noon and 4 pm. 60 tablet 0  . ibandronate (BONIVA) 150 MG tablet Take 1 tablet (150 mg total) by mouth every 30 (thirty) days. 3 tablet 3  . lisinopril (PRINIVIL,ZESTRIL) 40 MG tablet Take 1 tablet (40 mg total) by mouth daily. 90 tablet 3  . Multiple Vitamin (MULTIVITAMIN WITH MINERALS) TABS tablet Take 1 tablet by mouth daily.  30 tablet 0  . omeprazole (PRILOSEC) 20 MG capsule TAKE 2 CAPSULES BY MOUTH TWICE DAILY BEFORE A MEAL 60 capsule 0  . oxybutynin (DITROPAN-XL) 10 MG 24 hr tablet Take 10 mg by mouth daily.    Marland Kitchen triamcinolone ointment (KENALOG) 0.5 % Apply 1 application topically 4 (four) times daily. As needed for rash. (Patient not taking: Reported on 05/06/2019) 30 g 11   No current facility-administered medications for this visit.     No Known Allergies  Social History   Socioeconomic History  . Marital status: Widowed    Spouse name: Not on file  . Number of children: Not on file  . Years of education: Not on file  . Highest education level: Not on file  Occupational History  . Not on file  Social Needs  . Financial resource strain: Not on file  . Food insecurity    Worry: Not on file    Inability: Not on file  . Transportation needs    Medical: Not on file    Non-medical: Not on file  Tobacco Use  . Smoking status: Current Every Day Smoker    Packs/day: 0.70    Years: 60.00    Pack years: 42.00    Types: Cigarettes  . Smokeless tobacco: Never Used  Substance and Sexual Activity  . Alcohol use: No    Alcohol/week: 0.0 standard drinks  . Drug use: No  . Sexual activity: Not on file  Lifestyle  . Physical activity    Days per week: Not on file    Minutes per session: Not on file  . Stress: Not on file  Relationships  . Social Herbalist on phone: Not on file    Gets together: Not on file    Attends religious service: Not on file    Active member of club or organization: Not on file    Attends meetings of clubs or organizations: Not on file    Relationship status: Not on file  . Intimate partner violence    Fear of current or ex partner: Not on file    Emotionally abused: Not on file    Physically abused: Not on file    Forced sexual activity: Not on file  Other Topics Concern  . Not on file  Social History Narrative   Marital status: widowed 28 years ago.  From  [place]; moved to [place] 1974.     Children: none       Lives:  Alone; brother in law is Psychologist, sport and exercise who is 22.      Employment: retired.       Tobacco:  1/2 ppd       Alcohol:  None      Exercise:  Walking daily.      ADLs: no driving.    Review of Systems  Constitutional: Negative.  Negative for chills and fever.  HENT: Negative.  Negative for congestion and sore throat.   Eyes: Negative for blurred vision and  double vision.  Respiratory: Negative.  Negative for cough and shortness of breath.   Cardiovascular: Negative.  Negative for chest pain and palpitations.  Gastrointestinal: Negative.  Negative for abdominal pain, diarrhea, nausea and vomiting.  Skin:       Bruising to right arm  Neurological: Negative for dizziness, focal weakness, seizures, loss of consciousness and headaches.  All other systems reviewed and are negative.   Objective   Vitals as reported by the patient: There were no vitals filed for this visit.  There are no diagnoses linked to this encounter. Diagnoses and all orders for this visit:  Arm contusion, right, initial encounter  Accidental fall, initial encounter  Fall prevention instruction was given.  Advised to take Tylenol for pain as needed.  Contact the office if any problems or worse during the next 2 weeks. It is critically important to prevent falling down (keep floor areas well-lit, dry, and free of loose objects.  If you have a cane, walker, or wheelchair, you should use it, even for short trips around the house.  Wear flat-soled shoes.  Also, try not to rush)   I discussed the assessment and treatment plan with the patient. The patient was provided an opportunity to ask questions and all were answered. The patient agreed with the plan and demonstrated an understanding of the instructions.   The patient was advised to call back or seek an in-person evaluation if the symptoms worsen or if the condition fails to improve as anticipated.  I provided 30  minutes of non-face-to-face time during this encounter.  Horald Pollen, MD  Primary Care at [facility]

## 2019-05-06 NOTE — Patient Instructions (Signed)
° ° ° °  If you have lab work done today you will be contacted with your lab results within the next 2 weeks.  If you have not heard from us then please contact us. The fastest way to get your results is to register for My Chart. ° ° °IF you received an x-ray today, you will receive an invoice from [facility]. Please contact [facility] at [phone] with questions or concerns regarding your invoice.  ° °IF you received labwork today, you will receive an invoice from [facility]. Please contact [facility] at [phone] with questions or concerns regarding your invoice.  ° °Our billing staff will not be able to assist you with questions regarding bills from these companies. ° °You will be contacted with the lab results as soon as they are available. The fastest way to get your results is to activate your My Chart account. Instructions are located on the last page of this paperwork. If you have not heard from us regarding the results in 2 weeks, please contact this office. °  ° ° ° °

## 2019-05-17 ENCOUNTER — Encounter (HOSPITAL_COMMUNITY): Payer: Self-pay

## 2019-05-17 ENCOUNTER — Emergency Department (HOSPITAL_COMMUNITY): Payer: Medicare Other

## 2019-05-17 ENCOUNTER — Other Ambulatory Visit: Payer: Self-pay

## 2019-05-17 ENCOUNTER — Inpatient Hospital Stay (HOSPITAL_COMMUNITY)
Admission: EM | Admit: 2019-05-17 | Discharge: 2019-05-29 | DRG: 641 | Disposition: A | Discharge status: Skilled Nursing Facility | Payer: Medicare Other | Attending: Family Medicine | Admitting: Family Medicine

## 2019-05-17 DIAGNOSIS — D119 Benign neoplasm of major salivary gland, unspecified: Secondary | ICD-10-CM | POA: Diagnosis not present

## 2019-05-17 DIAGNOSIS — R5381 Other malaise: Secondary | ICD-10-CM | POA: Diagnosis not present

## 2019-05-17 DIAGNOSIS — S42251A Displaced fracture of greater tuberosity of right humerus, initial encounter for closed fracture: Secondary | ICD-10-CM | POA: Insufficient documentation

## 2019-05-17 DIAGNOSIS — Z20828 Contact with and (suspected) exposure to other viral communicable diseases: Secondary | ICD-10-CM | POA: Diagnosis present

## 2019-05-17 DIAGNOSIS — M2012 Hallux valgus (acquired), left foot: Secondary | ICD-10-CM | POA: Diagnosis not present

## 2019-05-17 DIAGNOSIS — G8929 Other chronic pain: Secondary | ICD-10-CM | POA: Diagnosis not present

## 2019-05-17 DIAGNOSIS — K219 Gastro-esophageal reflux disease without esophagitis: Secondary | ICD-10-CM | POA: Diagnosis not present

## 2019-05-17 DIAGNOSIS — R1312 Dysphagia, oropharyngeal phase: Secondary | ICD-10-CM | POA: Diagnosis not present

## 2019-05-17 DIAGNOSIS — M25551 Pain in right hip: Secondary | ICD-10-CM | POA: Diagnosis not present

## 2019-05-17 DIAGNOSIS — R1310 Dysphagia, unspecified: Secondary | ICD-10-CM | POA: Diagnosis not present

## 2019-05-17 DIAGNOSIS — Z79899 Other long term (current) drug therapy: Secondary | ICD-10-CM

## 2019-05-17 DIAGNOSIS — M810 Age-related osteoporosis without current pathological fracture: Secondary | ICD-10-CM | POA: Diagnosis present

## 2019-05-17 DIAGNOSIS — M8000XD Age-related osteoporosis with current pathological fracture, unspecified site, subsequent encounter for fracture with routine healing: Secondary | ICD-10-CM | POA: Diagnosis not present

## 2019-05-17 DIAGNOSIS — N3281 Overactive bladder: Secondary | ICD-10-CM | POA: Diagnosis not present

## 2019-05-17 DIAGNOSIS — M47812 Spondylosis without myelopathy or radiculopathy, cervical region: Secondary | ICD-10-CM | POA: Diagnosis not present

## 2019-05-17 DIAGNOSIS — R32 Unspecified urinary incontinence: Secondary | ICD-10-CM | POA: Diagnosis present

## 2019-05-17 DIAGNOSIS — F17210 Nicotine dependence, cigarettes, uncomplicated: Secondary | ICD-10-CM | POA: Diagnosis present

## 2019-05-17 DIAGNOSIS — M79672 Pain in left foot: Secondary | ICD-10-CM | POA: Diagnosis present

## 2019-05-17 DIAGNOSIS — Z789 Other specified health status: Secondary | ICD-10-CM | POA: Diagnosis not present

## 2019-05-17 DIAGNOSIS — M19071 Primary osteoarthritis, right ankle and foot: Secondary | ICD-10-CM | POA: Diagnosis not present

## 2019-05-17 DIAGNOSIS — R413 Other amnesia: Secondary | ICD-10-CM | POA: Diagnosis present

## 2019-05-17 DIAGNOSIS — M25562 Pain in left knee: Secondary | ICD-10-CM | POA: Diagnosis not present

## 2019-05-17 DIAGNOSIS — R1311 Dysphagia, oral phase: Secondary | ICD-10-CM | POA: Diagnosis not present

## 2019-05-17 DIAGNOSIS — D649 Anemia, unspecified: Secondary | ICD-10-CM | POA: Diagnosis not present

## 2019-05-17 DIAGNOSIS — D638 Anemia in other chronic diseases classified elsewhere: Secondary | ICD-10-CM | POA: Diagnosis not present

## 2019-05-17 DIAGNOSIS — R768 Other specified abnormal immunological findings in serum: Secondary | ICD-10-CM | POA: Diagnosis present

## 2019-05-17 DIAGNOSIS — Z9181 History of falling: Secondary | ICD-10-CM

## 2019-05-17 DIAGNOSIS — K2970 Gastritis, unspecified, without bleeding: Secondary | ICD-10-CM | POA: Diagnosis not present

## 2019-05-17 DIAGNOSIS — R221 Localized swelling, mass and lump, neck: Secondary | ICD-10-CM | POA: Diagnosis not present

## 2019-05-17 DIAGNOSIS — M79671 Pain in right foot: Secondary | ICD-10-CM | POA: Diagnosis present

## 2019-05-17 DIAGNOSIS — R410 Disorientation, unspecified: Secondary | ICD-10-CM | POA: Diagnosis not present

## 2019-05-17 DIAGNOSIS — S42252A Displaced fracture of greater tuberosity of left humerus, initial encounter for closed fracture: Secondary | ICD-10-CM | POA: Diagnosis not present

## 2019-05-17 DIAGNOSIS — M25561 Pain in right knee: Secondary | ICD-10-CM | POA: Diagnosis present

## 2019-05-17 DIAGNOSIS — S3993XA Unspecified injury of pelvis, initial encounter: Secondary | ICD-10-CM | POA: Diagnosis not present

## 2019-05-17 DIAGNOSIS — R4702 Dysphasia: Secondary | ICD-10-CM | POA: Diagnosis present

## 2019-05-17 DIAGNOSIS — M4800 Spinal stenosis, site unspecified: Secondary | ICD-10-CM | POA: Diagnosis present

## 2019-05-17 DIAGNOSIS — R8271 Bacteriuria: Secondary | ICD-10-CM | POA: Diagnosis not present

## 2019-05-17 DIAGNOSIS — S42251D Displaced fracture of greater tuberosity of right humerus, subsequent encounter for fracture with routine healing: Secondary | ICD-10-CM | POA: Diagnosis not present

## 2019-05-17 DIAGNOSIS — M545 Low back pain: Secondary | ICD-10-CM | POA: Diagnosis present

## 2019-05-17 DIAGNOSIS — Z03818 Encounter for observation for suspected exposure to other biological agents ruled out: Secondary | ICD-10-CM | POA: Diagnosis not present

## 2019-05-17 DIAGNOSIS — R2689 Other abnormalities of gait and mobility: Secondary | ICD-10-CM | POA: Diagnosis not present

## 2019-05-17 DIAGNOSIS — R4182 Altered mental status, unspecified: Secondary | ICD-10-CM | POA: Diagnosis present

## 2019-05-17 DIAGNOSIS — Z7401 Bed confinement status: Secondary | ICD-10-CM | POA: Diagnosis not present

## 2019-05-17 DIAGNOSIS — M2550 Pain in unspecified joint: Secondary | ICD-10-CM | POA: Diagnosis not present

## 2019-05-17 DIAGNOSIS — R296 Repeated falls: Secondary | ICD-10-CM | POA: Diagnosis present

## 2019-05-17 DIAGNOSIS — G319 Degenerative disease of nervous system, unspecified: Secondary | ICD-10-CM | POA: Diagnosis not present

## 2019-05-17 DIAGNOSIS — E871 Hypo-osmolality and hyponatremia: Secondary | ICD-10-CM | POA: Diagnosis not present

## 2019-05-17 DIAGNOSIS — I10 Essential (primary) hypertension: Secondary | ICD-10-CM | POA: Diagnosis present

## 2019-05-17 DIAGNOSIS — R4020 Unspecified coma: Secondary | ICD-10-CM | POA: Diagnosis not present

## 2019-05-17 DIAGNOSIS — M25511 Pain in right shoulder: Secondary | ICD-10-CM | POA: Diagnosis not present

## 2019-05-17 DIAGNOSIS — J449 Chronic obstructive pulmonary disease, unspecified: Secondary | ICD-10-CM | POA: Diagnosis not present

## 2019-05-17 DIAGNOSIS — L89112 Pressure ulcer of right upper back, stage 2: Secondary | ICD-10-CM | POA: Diagnosis not present

## 2019-05-17 DIAGNOSIS — K118 Other diseases of salivary glands: Secondary | ICD-10-CM | POA: Diagnosis not present

## 2019-05-17 DIAGNOSIS — M8448XA Pathological fracture, other site, initial encounter for fracture: Secondary | ICD-10-CM | POA: Diagnosis present

## 2019-05-17 DIAGNOSIS — K224 Dyskinesia of esophagus: Secondary | ICD-10-CM | POA: Diagnosis present

## 2019-05-17 DIAGNOSIS — K5900 Constipation, unspecified: Secondary | ICD-10-CM

## 2019-05-17 DIAGNOSIS — S0990XA Unspecified injury of head, initial encounter: Secondary | ICD-10-CM | POA: Diagnosis not present

## 2019-05-17 DIAGNOSIS — D110 Benign neoplasm of parotid gland: Secondary | ICD-10-CM | POA: Diagnosis present

## 2019-05-17 DIAGNOSIS — S22080A Wedge compression fracture of T11-T12 vertebra, initial encounter for closed fracture: Secondary | ICD-10-CM | POA: Diagnosis not present

## 2019-05-17 DIAGNOSIS — L89326 Pressure-induced deep tissue damage of left buttock: Secondary | ICD-10-CM | POA: Diagnosis not present

## 2019-05-17 DIAGNOSIS — M4802 Spinal stenosis, cervical region: Secondary | ICD-10-CM | POA: Diagnosis not present

## 2019-05-17 DIAGNOSIS — I469 Cardiac arrest, cause unspecified: Secondary | ICD-10-CM | POA: Diagnosis not present

## 2019-05-17 DIAGNOSIS — S199XXA Unspecified injury of neck, initial encounter: Secondary | ICD-10-CM | POA: Diagnosis not present

## 2019-05-17 DIAGNOSIS — M6281 Muscle weakness (generalized): Secondary | ICD-10-CM | POA: Diagnosis not present

## 2019-05-17 DIAGNOSIS — J440 Chronic obstructive pulmonary disease with acute lower respiratory infection: Secondary | ICD-10-CM | POA: Diagnosis not present

## 2019-05-17 HISTORY — DX: Spinal stenosis, site unspecified: M48.00

## 2019-05-17 HISTORY — DX: Tobacco use: Z72.0

## 2019-05-17 HISTORY — DX: Age-related osteoporosis without current pathological fracture: M81.0

## 2019-05-17 LAB — COMPREHENSIVE METABOLIC PANEL
ALT: 19 U/L (ref 0–44)
AST: 21 U/L (ref 15–41)
Albumin: 3.6 g/dL (ref 3.5–5.0)
Alkaline Phosphatase: 119 U/L (ref 38–126)
Anion gap: 13 (ref 5–15)
BUN: 17 mg/dL (ref 8–23)
CO2: 22 mmol/L (ref 22–32)
Calcium: 9.5 mg/dL (ref 8.9–10.3)
Chloride: 74 mmol/L — ABNORMAL LOW (ref 98–111)
Creatinine, Ser: 0.73 mg/dL (ref 0.44–1.00)
GFR calc Af Amer: >60 mL/min (ref >60)
GFR calc non Af Amer: >60 mL/min (ref >60)
Glucose, Bld: 123 mg/dL — ABNORMAL HIGH (ref 70–99)
Potassium: 4.1 mmol/L (ref 3.5–5.1)
Sodium: 109 mmol/L — CL (ref 135–145)
Total Bilirubin: 1 mg/dL (ref 0.3–1.2)
Total Protein: 7.4 g/dL (ref 6.5–8.1)

## 2019-05-17 LAB — SODIUM
Sodium: 110 mmol/L — CL (ref 135–145)
Sodium: 113 mmol/L — CL (ref 135–145)

## 2019-05-17 LAB — CBC WITH DIFFERENTIAL/PLATELET
Abs Immature Granulocytes: 0.07 10*3/uL (ref 0.00–0.07)
Basophils Absolute: 0 10*3/uL (ref 0.0–0.1)
Basophils Relative: 0 %
Eosinophils Absolute: 0.1 10*3/uL (ref 0.0–0.5)
Eosinophils Relative: 1 %
HCT: 30.2 % — ABNORMAL LOW (ref 36.0–46.0)
Hemoglobin: 11 g/dL — ABNORMAL LOW (ref 12.0–15.0)
Immature Granulocytes: 1 %
Lymphocytes Relative: 11 %
Lymphs Abs: 1.2 10*3/uL (ref 0.7–4.0)
MCH: 31.8 pg (ref 26.0–34.0)
MCHC: 36.4 g/dL — ABNORMAL HIGH (ref 30.0–36.0)
MCV: 87.3 fL (ref 80.0–100.0)
Monocytes Absolute: 0.7 10*3/uL (ref 0.1–1.0)
Monocytes Relative: 7 %
Neutro Abs: 8.5 10*3/uL — ABNORMAL HIGH (ref 1.7–7.7)
Neutrophils Relative %: 80 %
Platelets: 313 10*3/uL (ref 150–400)
RBC: 3.46 MIL/uL — ABNORMAL LOW (ref 3.87–5.11)
RDW: 11.8 % (ref 11.5–15.5)
WBC: 10.5 10*3/uL (ref 4.0–10.5)
nRBC: 0 % (ref 0.0–0.2)

## 2019-05-17 LAB — GLUCOSE, CAPILLARY: Glucose-Capillary: 135 mg/dL — ABNORMAL HIGH (ref 70–99)

## 2019-05-17 LAB — SARS CORONAVIRUS 2 BY RT PCR (HOSPITAL ORDER, PERFORMED IN ~~LOC~~ HOSPITAL LAB): SARS Coronavirus 2: NEGATIVE

## 2019-05-17 LAB — OSMOLALITY: Osmolality: 242 mOsm/kg — CL (ref 275–295)

## 2019-05-17 MED ORDER — ACETAMINOPHEN 500 MG PO TABS
1000.0000 mg | ORAL_TABLET | Freq: Once | ORAL | Status: AC
  Administered 2019-05-17: 17:00:00 1000 mg via ORAL
  Filled 2019-05-17: qty 2

## 2019-05-17 MED ORDER — LISINOPRIL 40 MG PO TABS
40.0000 mg | ORAL_TABLET | Freq: Every day | ORAL | Status: DC
  Administered 2019-05-18 – 2019-05-20 (×3): 40 mg via ORAL
  Filled 2019-05-17 (×3): qty 1

## 2019-05-17 MED ORDER — ACETAMINOPHEN 10 MG/ML IV SOLN
1000.0000 mg | Freq: Four times a day (QID) | INTRAVENOUS | Status: DC | PRN
  Administered 2019-05-18 (×2): 1000 mg via INTRAVENOUS
  Filled 2019-05-17 (×4): qty 100

## 2019-05-17 MED ORDER — SODIUM CHLORIDE 3 % IV SOLN
INTRAVENOUS | Status: DC
  Administered 2019-05-17: 25 mL/h via INTRAVENOUS
  Filled 2019-05-17 (×2): qty 500

## 2019-05-17 MED ORDER — SODIUM CHLORIDE 0.9 % IV BOLUS: 1000.0000 mL | Freq: Once | INTRAVENOUS | Status: DC

## 2019-05-17 MED ORDER — HEPARIN SODIUM (PORCINE) 5000 UNIT/ML IJ SOLN
5000.0000 [IU] | Freq: Three times a day (TID) | INTRAMUSCULAR | Status: AC
  Administered 2019-05-17 – 2019-05-23 (×17): 5000 [IU] via SUBCUTANEOUS
  Filled 2019-05-17 (×17): qty 1

## 2019-05-17 MED ORDER — AMLODIPINE BESYLATE 2.5 MG PO TABS
2.5000 mg | ORAL_TABLET | Freq: Every day | ORAL | Status: DC
  Administered 2019-05-18 – 2019-05-20 (×3): 2.5 mg via ORAL
  Filled 2019-05-17 (×3): qty 1

## 2019-05-17 MED ORDER — SODIUM CHLORIDE 3 % IV SOLN
INTRAVENOUS | Status: DC
  Filled 2019-05-17 (×2): qty 500

## 2019-05-17 MED ORDER — SODIUM CHLORIDE 0.9 % IV BOLUS
1000.0000 mL | Freq: Once | INTRAVENOUS | Status: AC
  Administered 2019-05-17: 1000 mL via INTRAVENOUS

## 2019-05-17 NOTE — ED Triage Notes (Addendum)
Pt c/o pain at right shoulder and right hip. Swelling noted to left side of face and left brow. Pt gives minnimal information and does not respond to [place] interpretor on phone. Family states pt fell hurting right shoulder 3 weeks ago, fell hurting left hip 2 weeks ago. Here today because pt fell in shower striking head. Denies LOC. States pt not answering questions is part of her norm.

## 2019-05-17 NOTE — Progress Notes (Signed)
[facility] Progress Note Patient Name: Barbara Dennis DOB: 05/10/1934 MRN: [id number]   Date of Service  05/17/2019  HPI/Events of Note  Pt with fractured right humerus and pain  eICU Interventions  Acetaminophen 1 gm iv Q 6 hours prn pain x 4 doses then discontinue.        [name] 05/17/2019, 11:50 PM

## 2019-05-17 NOTE — ED Provider Notes (Signed)
[facility] EMERGENCY DEPARTMENT Provider Note   CSN: [id number] Arrival date & time: 05/17/19  1349    History   Chief Complaint Chief Complaint  Patient presents with   Fall   Hip Pain   Shoulder Pain    HPI Barbara Dennis is a 85 y.o. female with a past medical history of hypertension, GERD, urinary incontinence presents to ED for injuries after mechanical falls that occurred in the past 3 weeks.  History is provided by daughter at bedside.  States that her first fall was 3 weeks ago.  She was trying to walk using a cane instead of a walker and then fell and hurt her right shoulder.  She had a fall 2 weeks ago where she was drinking water and lost her balance and fell hurting her right hip.  This happened 2 weeks ago.  States that patient has not walked for the past week due to pain.  She was in the shower with family member helping her get washed when she fell and hit the back of her head.  Family denies any loss of consciousness with any of these falls.  States that because she is walking less, she has had decreased appetite as well as constipation.  She was given stool softener yesterday with success and having bowel movement.  She has been hydrating with water and milk.  Her main complaint is pain at the right hip and right shoulder as well as lower back pain.  Denies any chest pain, abdominal pain, urinary symptoms.     The history is provided by a relative. The history is limited by a language barrier. A language interpreter was used.    Past Medical History:  Diagnosis Date   Anemia of chronic disease    GERD (gastroesophageal reflux disease)    Hypertension    Mass of right side of neck    Urinary incontinence     Patient Active Problem List   Diagnosis Date Noted   Frequent falls 12/17/2018   Pedal edema 10/21/2018   Osteoporosis 09/12/2018   Overactive bladder 08/29/2018   O2 dependent 08/11/2018   Hyponatremia 08/02/2018    Gastroesophageal reflux disease 07/16/2018   Chronic pain syndrome 01/07/2018   Chronic obstructive pulmonary disease ([place]) 08/20/2017   Multinodular goiter 08/15/2017   Bilateral leg pain 07/15/2017   Chronic bilateral low back pain without sciatica 07/15/2017   Essential hypertension, benign 07/15/2017   Hypercalcemia 07/15/2017   Depression with anxiety 01/06/2015   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   DJD (degenerative joint disease) 02/16/2013   Tobacco user 09/23/2012   Menopause 08/21/2012   Postmenopausal atrophic vaginitis 08/21/2012    Past Surgical History:  Procedure Laterality Date   APPENDECTOMY     BIOPSY  08/24/2018   Procedure: BIOPSY;  Surgeon: Thornton Park, MD;  Location: [facility];  Service: Gastroenterology;;   ESOPHAGOGASTRODUODENOSCOPY (EGD) WITH PROPOFOL N/A 08/24/2018   Procedure: ESOPHAGOGASTRODUODENOSCOPY (EGD) WITH PROPOFOL;  Surgeon: Thornton Park, MD;  Location: [facility];  Service: Gastroenterology;  Laterality: N/A;     OB History   No obstetric history on file.      Home Medications    Prior to Admission medications   Medication Sig Start Date End Date Taking? Authorizing Provider  acetaminophen (TYLENOL) 500 MG tablet Take 2 tablets (1,000 mg total) by mouth every 8 (eight) hours. 07/23/18   Jill Alexanders, PA-C  albuterol (PROVENTIL HFA;VENTOLIN HFA) 108 (90 Base) MCG/ACT inhaler Inhale 2 puffs into  the lungs every 4 (four) hours as needed for wheezing or shortness of breath. 09/18/18   Hennie Duos, MD  amLODipine (NORVASC) 2.5 MG tablet Take 1 tablet (2.5 mg total) by mouth daily. 12/19/18 12/19/19  Georgette Shell, MD  calcium elemental as carbonate (TUMS ULTRA 1000) 400 MG chewable tablet Chew 3 tablets (1,200 mg total) by mouth 2 times daily at 12 noon and 4 pm. 09/18/18   Hennie Duos, MD  ibandronate (BONIVA) 150 MG tablet Take 1 tablet (150 mg total) by mouth every 30 (thirty) days.  03/22/19   Renato Shin, MD  lisinopril (PRINIVIL,ZESTRIL) 40 MG tablet Take 1 tablet (40 mg total) by mouth daily. 12/17/18   Horald Pollen, MD  Multiple Vitamin (MULTIVITAMIN WITH MINERALS) TABS tablet Take 1 tablet by mouth daily. 09/18/18   Hennie Duos, MD  omeprazole (PRILOSEC) 20 MG capsule TAKE 2 CAPSULES BY MOUTH TWICE DAILY BEFORE A MEAL 09/18/18   Hennie Duos, MD  oxybutynin (DITROPAN-XL) 10 MG 24 hr tablet Take 10 mg by mouth daily. 11/12/18   [provider]  triamcinolone ointment (KENALOG) 0.5 % Apply 1 application topically 4 (four) times daily. As needed for rash. Patient not taking: Reported on 05/06/2019 03/22/19   Renato Shin, MD    Family History Family History  Problem Relation Age of Onset   Hypercalcemia Neg Hx    Osteoporosis Neg Hx     Social History Social History   Tobacco Use   Smoking status: Current Every Day Smoker    Packs/day: 0.70    Years: 60.00    Pack years: 42.00    Types: Cigarettes   Smokeless tobacco: Never Used  Substance Use Topics   Alcohol use: No    Alcohol/week: 0.0 standard drinks   Drug use: No     Allergies   Patient has no known allergies.   Review of Systems Review of Systems  Unable to perform ROS: Age  Musculoskeletal: Positive for arthralgias.     Physical Exam Updated Vital Signs BP (!) 161/76    Pulse 87    Temp 98 F (36.7 C)    Resp 16    Ht 5' (1.524 m)    Wt 45.4 kg    SpO2 93%    BMI 19.53 kg/m   Physical Exam Vitals signs and nursing note reviewed.  Constitutional:      General: She is not in acute distress.    Appearance: She is well-developed.     Comments: Elderly, frail.  HENT:     Head: Normocephalic and atraumatic.     Nose: Nose normal.  Eyes:     General: No scleral icterus.       Right eye: No discharge.        Left eye: No discharge.     Conjunctiva/sclera: Conjunctivae normal.     Pupils: Pupils are equal, round, and reactive to light.  Neck:      Musculoskeletal: Normal range of motion and neck supple.  Cardiovascular:     Rate and Rhythm: Normal rate and regular rhythm.     Heart sounds: Normal heart sounds. No murmur. No friction rub. No gallop.   Pulmonary:     Effort: Pulmonary effort is normal. No respiratory distress.     Breath sounds: Normal breath sounds.  Abdominal:     General: Bowel sounds are normal. There is no distension.     Palpations: Abdomen is soft.     Tenderness:  There is no abdominal tenderness. There is no guarding.  Musculoskeletal: Normal range of motion.       Arms:       Legs:     Comments: TTP of R shoulder with limited ROM, although is able to perform.  2+ radial pulse noted bilaterally.  Equal grip strength bilaterally.  Tenderness to palpation of the right hip and sacrum with pain with range of motion.  2+ DP pulse palpated bilaterally.  Skin:    General: Skin is warm and dry.     Findings: No rash.  Neurological:     General: No focal deficit present.     Mental Status: She is alert.     Cranial Nerves: No cranial nerve deficit.     Sensory: No sensory deficit.     Motor: No weakness or abnormal muscle tone.     Coordination: Coordination normal.     Comments: Pupils reactive. No facial asymmetry noted. Cranial nerves appear grossly intact. Sensation intact to light touch on face, BUE and BLE. Strength 5/5 in BUE and BLE.      ED Treatments / Results  Labs (all labs ordered are listed, but only abnormal results are displayed) Labs Reviewed  CBC WITH DIFFERENTIAL/PLATELET - Abnormal; Notable for the following components:      Result Value   RBC 3.46 (*)    Hemoglobin 11.0 (*)    HCT 30.2 (*)    MCHC 36.4 (*)    Neutro Abs 8.5 (*)    All other components within normal limits  COMPREHENSIVE METABOLIC PANEL - Abnormal; Notable for the following components:   Sodium 109 (*)    Chloride 74 (*)    Glucose, Bld 123 (*)    All other components within normal limits  URINE CULTURE   URINALYSIS, ROUTINE W REFLEX MICROSCOPIC  SODIUM  SODIUM  SODIUM  OSMOLALITY  OSMOLALITY, URINE  SODIUM, URINE, RANDOM    EKG EKG Interpretation  Date/Time:  Monday May 17 2019 16:33:47 EDT Ventricular Rate:  87 PR Interval:    QRS Duration: 103 QT Interval:  410 QTC Calculation: 494 R Axis:   -83 Text Interpretation:  Sinus rhythm RSR' in V1 or V2, probably normal variant Left ventricular hypertrophy Inferior infarct, old Artifact No significant change since last tracing Confirmed by Dorie Rank [phone]) on 05/17/2019 4:37:53 PM   Radiology Dg Shoulder Right  Result Date: 05/17/2019 CLINICAL DATA:  85 year old female with acute pain, non-English-speaking. EXAM: RIGHT SHOULDER - 2+ VIEW COMPARISON:  Chest radiographs 07/20/2018 and earlier. FINDINGS: Comminuted fracture through the greater tuberosity of the right humerus. Mildly displaced fragments. No associated glenohumeral joint dislocation. The humeral neck appears intact. The right clavicle and scapula appear intact. There are chronic right lateral rib fractures. Stable visible right lung. Chronic calcified mediastinal lymph nodes. There are thoracic spine compression fractures which are new or increased since 2019 (especially in the lower spine). IMPRESSION: 1. Comminuted fracture of the greater tuberosity of the right humerus. No glenohumeral joint dislocation. 2. Thoracic spine compression fractures are new or increased since 2019. Chronic right rib fractures. Electronically Signed   By: Genevie Ann M.D.   On: 05/17/2019 16:14   Ct Head Wo Contrast  Result Date: 05/17/2019 CLINICAL DATA:  Fall.  Head injury EXAM: CT HEAD WITHOUT CONTRAST CT CERVICAL SPINE WITHOUT CONTRAST TECHNIQUE: Multidetector CT imaging of the head and cervical spine was performed following the standard protocol without intravenous contrast. Multiplanar CT image reconstructions of the cervical spine were  also generated. COMPARISON:  CT 08/18/2018 FINDINGS: CT  HEAD FINDINGS Brain: Moderate atrophy. Chronic microvascular ischemic changes in the white matter left greater than right. Chronic ischemia left internal capsule unchanged. Negative for acute infarct, hemorrhage, mass. No midline shift. Vascular: Negative for hyperdense vessel. Atherosclerotic calcification cavernous carotid bilaterally. Skull: Negative for fracture Sinuses/Orbits: Negative Other: None CT CERVICAL SPINE FINDINGS Alignment: 4 mm retrolisthesis C5-6 Skull base and vertebrae: Negative for fracture Soft tissues and spinal canal: Atherosclerotic calcification carotid artery bilaterally. No soft tissue mass Disc levels: Moderately large central disc protrusion at C3-4 causing spinal stenosis. Advanced facet degeneration on the left at C4-5 causing left foraminal encroachment and mild spinal stenosis. Moderate to severe spinal stenosis at C5-6 due to retrolisthesis and advanced spondylosis as well as bilateral facet degeneration. Right foraminal disc protrusion at C6-7. Upper chest: Negative Other: None IMPRESSION: 1. No acute intracranial abnormality. Atrophy with chronic microvascular ischemia 2. Negative for cervical spine fracture 3. Advanced cervical spondylosis most severe at C5-6 with moderate to severe spinal stenosis. Electronically Signed   By: Franchot Gallo M.D.   On: 05/17/2019 19:35   Ct Cervical Spine Wo Contrast  Result Date: 05/17/2019 CLINICAL DATA:  Fall.  Head injury EXAM: CT HEAD WITHOUT CONTRAST CT CERVICAL SPINE WITHOUT CONTRAST TECHNIQUE: Multidetector CT imaging of the head and cervical spine was performed following the standard protocol without intravenous contrast. Multiplanar CT image reconstructions of the cervical spine were also generated. COMPARISON:  CT 08/18/2018 FINDINGS: CT HEAD FINDINGS Brain: Moderate atrophy. Chronic microvascular ischemic changes in the white matter left greater than right. Chronic ischemia left internal capsule unchanged. Negative for acute  infarct, hemorrhage, mass. No midline shift. Vascular: Negative for hyperdense vessel. Atherosclerotic calcification cavernous carotid bilaterally. Skull: Negative for fracture Sinuses/Orbits: Negative Other: None CT CERVICAL SPINE FINDINGS Alignment: 4 mm retrolisthesis C5-6 Skull base and vertebrae: Negative for fracture Soft tissues and spinal canal: Atherosclerotic calcification carotid artery bilaterally. No soft tissue mass Disc levels: Moderately large central disc protrusion at C3-4 causing spinal stenosis. Advanced facet degeneration on the left at C4-5 causing left foraminal encroachment and mild spinal stenosis. Moderate to severe spinal stenosis at C5-6 due to retrolisthesis and advanced spondylosis as well as bilateral facet degeneration. Right foraminal disc protrusion at C6-7. Upper chest: Negative Other: None IMPRESSION: 1. No acute intracranial abnormality. Atrophy with chronic microvascular ischemia 2. Negative for cervical spine fracture 3. Advanced cervical spondylosis most severe at C5-6 with moderate to severe spinal stenosis. Electronically Signed   By: Franchot Gallo M.D.   On: 05/17/2019 19:35   Ct Pelvis Wo Contrast  Result Date: 05/17/2019 CLINICAL DATA:  Fall, pelvic pain EXAM: CT PELVIS WITHOUT CONTRAST TECHNIQUE: Multidetector CT imaging of the pelvis was performed following the standard protocol without intravenous contrast. COMPARISON:  Right hip/pelvic radiographs dated 05/17/2019 FINDINGS: Urinary Tract:  Bladder is within normal limits. Bowel:  Visualized bowel is unremarkable. Vascular/Lymphatic: Atherosclerotic calcifications of the infrarenal abdominal aorta and bilateral iliac vessels. No evidence of lymphadenopathy. Reproductive:  Uterus is within normal limits. Bilateral ovaries are within normal limits. Other:  No pelvic ascites. Musculoskeletal: No evidence of fracture. Specifically, the bilateral hips and pelvis are intact. IMPRESSION: No evidence of pelvic fracture.  Electronically Signed   By: Julian Hy M.D.   On: 05/17/2019 19:52   Dg Hip Unilat W Or Wo Pelvis 2-3 Views Right  Result Date: 05/17/2019 CLINICAL DATA:  85 year old female with right hip pain. Non-English-speaking. EXAM: DG HIP (WITH OR WITHOUT PELVIS)  2-3V RIGHT COMPARISON:  CT Abdomen and Pelvis 07/14/2018. Hip radiographs 12/17/2018. FINDINGS: Extensive iliofemoral calcified atherosclerosis. Numerous phleboliths in the pelvis. The pelvis appears stable and intact. Femoral heads are normally located. Hip joint spaces are stable. Grossly intact proximal left femur. The proximal right femur appears intact. Stable visible bowel gas pattern. IMPRESSION: No acute osseous abnormality identified about the right hip or pelvis. Electronically Signed   By: Genevie Ann M.D.   On: 05/17/2019 16:17    Procedures .Critical Care Performed by: Delia Heady, PA-C Authorized by: Delia Heady, PA-C   Critical care provider statement:    Critical care time (minutes):  35   Critical care time was exclusive of:  Separately billable procedures and treating other patients   Critical care was necessary to treat or prevent imminent or life-threatening deterioration of the following conditions:  Circulatory failure, CNS failure or compromise and shock   Critical care was time spent personally by me on the following activities:  Development of treatment plan with patient or surrogate, discussions with consultants, discussions with primary provider, ordering and performing treatments and interventions, ordering and review of laboratory studies, ordering and review of radiographic studies, review of old charts, examination of patient and obtaining history from patient or surrogate   I assumed direction of critical care for this patient from another provider in my specialty: no     (including critical care time)  Medications Ordered in ED Medications  sodium chloride (hypertonic) 3 % solution (has no administration  in time range)  sodium chloride 0.9 % bolus 1,000 mL (0 mLs Intravenous Stopped 05/17/19 1843)  acetaminophen (TYLENOL) tablet 1,000 mg (1,000 mg Oral Given 05/17/19 1659)     Initial Impression / Assessment and Plan / ED Course  I have reviewed the triage vital signs and the nursing notes.  Pertinent labs & imaging results that were available during my care of the patient were reviewed by me and considered in my medical decision making (see chart for details).        85 year old female presents to ED for recurrent falls.  She has fallen 3 times in the past 3 weeks.  She has injured her right shoulder, right hip and today fell and hit her head.  These appear to have all been mechanical falls.  She has a history of hyponatremia.  Family member at bedside providing much of the history and states that patient has been eating less but has been trying to stay hydrated.  Patient unwilling to speak directly to me but will participate in exam.  Limited range of motion of the right shoulder and right hip but area is neurovascularly intact.  No gross facial asymmetry noted.  Vital signs are within normal limits.  X-ray of the hip is negative.  CT of the pelvis is negative.  CT of the head and neck are unremarkable.  X-ray of the right shoulder shows humerus fracture.  Lab work significant for hyponatremia of 109.  Family member does endorse gradually worsening confusion.  Patient will be started on hypertonic saline for what appears to be acute on chronic exacerbation of hyponatremia.  Will admit to critical care.  Final Clinical Impressions(s) / ED Diagnoses   Final diagnoses:  Acute pain of right shoulder  Hyponatremia  Closed displaced fracture of greater tuberosity of right humerus, initial encounter    ED Discharge Orders    None       Lulu Riding 05/17/19 2013    Dorie Rank, MD  05/21/19 0716 ° °

## 2019-05-17 NOTE — ED Notes (Signed)
Pt to ct 

## 2019-05-17 NOTE — H&P (Signed)
NAME:  Barbara Dennis, MRN:  [id number], DOB:  17-Oct-1934, LOS: 0 ADMISSION DATE:  05/17/2019, CONSULTATION DATE:  05/17/19 REFERRING MD:  Tomi Bamberger  CHIEF COMPLAINT:  AMS, falls   Brief History   Barbara Dennis is a 85 y.o. female who was admitted 7/27 with AMS and falls.  Found to have severe hyponatremia with Na 109 requiring hypertonic saline.  History of present illness   Pt is altered and [place] speaking; therefore, this HPI is obtained from chart review as well as interview with pt's friend / roommate using the [employer] interpretation software. Barbara Dennis is a 85 y.o. female who has a PMH including but not limited to HTN, right neck mass, urinary incontinence, GERD, anemia.  She presented to [facility] 7/27 after several falls over the past 1 - [redacted] weeks along with wosrening confusion / altered mental status.  In ED, labs revealed severe hyponatremia with Na 109.  Friend states she has had falls in the past that have seemed mechanical.  She also states she has had hyponatremia in the past and chart review shows that she required admission 12/17/18 through 12/19/18 for this which was believed to be hypovolemic in origin.  There has been some concern for underlying SIADH possibly from chronic right neck mass which has been followed with serial CT's as an outpatient (no biopsies as of yet).  [name] friend denies any recent fevers/chills/sweats, chest pain, cough, N/V/D, abd pain, myalgias.  Has had some right hip pain from falls.  She has not been walking for the past 1 week or so due to frequent falls; however, prior to this, she was able to walk independently with a walker.  She lives at home with friend and friends husband. Friend does state pt has had decreased PO intake due to dysphagia.  "She coughs and spits up a lot of the food that she eats (per translator)".  Friend states that due to this, pt has been hesitant to eat / drink much.  She has not been evaluated for this in the past.  Past Medical  History  HTN, right neck mass, urinary incontinence, GERD, anemia.  Significant Hospital Events   7/27 > admit.  Consults:  Ortho.  Procedures:  None.  Significant Diagnostic Tests:  R shoulder xray 7/27 > comminuted fx of right humerus. R hip xray 7/27 > negative. CT head / Cspine 7/27 > no acute process.  Advanced cervical spondylosis most severe at C5-C6 with severe spinal stenosis. CT pelvis 7/27 > negative.  Micro Data:  None.  Antimicrobials:  None.   Interim history/subjective:  Comfortable.  Somewhat confused, though language barrier presents challenges with interview.  Objective:  Blood pressure 129/80, pulse 84, temperature 98 F (36.7 C), resp. rate 17, height 5' (1.524 m), weight 45.4 kg, SpO2 95 %.       No intake or output data in the 24 hours ending 05/17/19 2101 Filed Weights   05/17/19 1416  Weight: 45.4 kg    Examination: General: Elderly female, in NAD. Neuro: Awake, follows intermittent basic commands. HEENT: Right neck bulge. Sclerae anicteric.  MM dry. Cardiovascular: RRR, no M/R/G.  Lungs: Respirations even and unlabored.  CTA bilaterally, No W/R/R.  Abdomen: BS x 4, soft, NT/ND.  Musculoskeletal: Right arm in sling, no edema.  Skin: Intact, warm, no rashes.  Assessment & Plan:   Severe hyponatremia - acute on chronic, baseline appears high 120's to low 130's.  Unclear etiology thus far; though, certainly exacerbated by decreased  PO intake due to dysphagia.  Some concern for underlying SIADH (? Right neck mass - see below). - Continue 3% NS, though drop from 50cc/hr to 25 cc/hr for goal Na rise of 0.66mEq/Lhr. - Frequent Na checks q4hrs. - Follow serum osmoles, urine osmoles, urine sodium. - Consider dDAVP. - ? Role of daily salt tabs. - Hold preadmission oxybutynin.  Chronic right neck mass -  Per notes, pt has elected to undergo intermittent surveillance CT scans. - Outpatient follow up / repeat imaging with dedicated CT soft tissue  neck.  Dysphagia. - SLP eval.  Frequent falls. - PT / OT eval.  Hx HTN. - Continue preadmission amlodipine, lisinopril.  Right humerus fx. - Day team to please consult orthopedics.  Advanced cervical spondylosis with moderate to severe spinal stenosis. - Once over acute issues, consider neurosurgery consult.  Best Practice:  Diet: NPO. Pain/Anxiety/Delirium protocol (if indicated): N/A. VAP protocol (if indicated): N/A. DVT prophylaxis: SCD's / Heparin. GI prophylaxis: N/A. Glucose control: N/A. Mobility: Bedrest. Code Status: Full. Family Communication: Discussed with pt's friend Anastasia Fiedler) and roommate using the [employer] language interpreter software.  Of note, she was listed as being pt's niece; however, she states she is in fact her friend and that pt does not have any family here.  I have updated the demographic information to reflect this. Disposition: ICU.  Labs   CBC: Recent Labs  Lab 05/17/19 1655  WBC 10.5  NEUTROABS 8.5*  HGB 11.0*  HCT 30.2*  MCV 87.3  PLT 751   Basic Metabolic Panel: Recent Labs  Lab 05/17/19 1655 05/17/19 2008  NA 109* 110*  K 4.1  --   CL 74*  --   CO2 22  --   GLUCOSE 123*  --   BUN 17  --   CREATININE 0.73  --   CALCIUM 9.5  --    GFR: Estimated Creatinine Clearance: 38.2 mL/min (by C-G formula based on SCr of 0.73 mg/dL). Recent Labs  Lab 05/17/19 1655  WBC 10.5   Liver Function Tests: Recent Labs  Lab 05/17/19 1655  AST 21  ALT 19  ALKPHOS 119  BILITOT 1.0  PROT 7.4  ALBUMIN 3.6   No results for input(s): LIPASE, AMYLASE in the last 168 hours. No results for input(s): AMMONIA in the last 168 hours. ABG No results found for: PHART, PCO2ART, PO2ART, HCO3, TCO2, ACIDBASEDEF, O2SAT  Coagulation Profile: No results for input(s): INR, PROTIME in the last 168 hours. Cardiac Enzymes: No results for input(s): CKTOTAL, CKMB, CKMBINDEX, TROPONINI in the last 168 hours. HbA1C: No results found for: HGBA1C  CBG: No results for input(s): GLUCAP in the last 168 hours.  Review of Systems:   Pt is encephelopathic; therefore, this HPI is obtained from chart review.  Past medical history  She,  has a past medical history of Anemia of chronic disease, GERD (gastroesophageal reflux disease), Hypertension, Mass of right side of neck, and Urinary incontinence.   Surgical History    Past Surgical History:  Procedure Laterality Date  . APPENDECTOMY    . BIOPSY  08/24/2018   Procedure: BIOPSY;  Surgeon: Thornton Park, MD;  Location: [facility];  Service: Gastroenterology;;  . ESOPHAGOGASTRODUODENOSCOPY (EGD) WITH PROPOFOL N/A 08/24/2018   Procedure: ESOPHAGOGASTRODUODENOSCOPY (EGD) WITH PROPOFOL;  Surgeon: Thornton Park, MD;  Location: [facility];  Service: Gastroenterology;  Laterality: N/A;     Social History   reports that she has been smoking cigarettes. She has a 42.00 pack-year smoking history. She has never  used smokeless tobacco. She reports that she does not drink alcohol or use drugs.   Family history   Her family history is negative for Hypercalcemia and Osteoporosis.   Allergies No Known Allergies   Home meds  Prior to Admission medications   Medication Sig Start Date End Date Taking? Authorizing Provider  acetaminophen (TYLENOL) 500 MG tablet Take 2 tablets (1,000 mg total) by mouth every 8 (eight) hours. 07/23/18   Jill Alexanders, PA-C  albuterol (PROVENTIL HFA;VENTOLIN HFA) 108 (90 Base) MCG/ACT inhaler Inhale 2 puffs into the lungs every 4 (four) hours as needed for wheezing or shortness of breath. 09/18/18   Hennie Duos, MD  amLODipine (NORVASC) 2.5 MG tablet Take 1 tablet (2.5 mg total) by mouth daily. 12/19/18 12/19/19  Georgette Shell, MD  calcium elemental as carbonate (TUMS ULTRA 1000) 400 MG chewable tablet Chew 3 tablets (1,200 mg total) by mouth 2 times daily at 12 noon and 4 pm. 09/18/18   Hennie Duos, MD  ibandronate (BONIVA) 150 MG tablet  Take 1 tablet (150 mg total) by mouth every 30 (thirty) days. 03/22/19   Renato Shin, MD  lisinopril (PRINIVIL,ZESTRIL) 40 MG tablet Take 1 tablet (40 mg total) by mouth daily. 12/17/18   Horald Pollen, MD  Multiple Vitamin (MULTIVITAMIN WITH MINERALS) TABS tablet Take 1 tablet by mouth daily. 09/18/18   Hennie Duos, MD  omeprazole (PRILOSEC) 20 MG capsule TAKE 2 CAPSULES BY MOUTH TWICE DAILY BEFORE A MEAL 09/18/18   Hennie Duos, MD  oxybutynin (DITROPAN-XL) 10 MG 24 hr tablet Take 10 mg by mouth daily. 11/12/18   [provider]  triamcinolone ointment (KENALOG) 0.5 % Apply 1 application topically 4 (four) times daily. As needed for rash. Patient not taking: Reported on 05/06/2019 03/22/19   Renato Shin, MD    Critical care time: 45 min.    Montey Hora, [facility] Pager: [phone].  If no answer, [phone] 05/17/2019, 9:01 PM

## 2019-05-17 NOTE — ED Notes (Signed)
Pa AWARE OF SODIUM.

## 2019-05-17 NOTE — Progress Notes (Signed)
eLink Physician-Brief Progress Note Patient Name: Barbara Dennis DOB: [birth year] MRN: [id number]   Date of Service  05/17/2019  HPI/Events of Note  Pt admitted s/p fall. Hyponatremic with Na+ 109, right Humerus Fx  eICU Interventions  New pt evaluation completed.        [name] 05/17/2019, 11:26 PM

## 2019-05-17 NOTE — ED Provider Notes (Signed)
Medical screening examination/treatment/procedure(s) were conducted as a shared visit with non-physician practitioner(s) and myself.  I personally evaluated the patient during the encounter.  EKG Interpretation  Date/Time:  Monday May 17 2019 16:33:47 EDT Ventricular Rate:  87 PR Interval:    QRS Duration: 103 QT Interval:  410 QTC Calculation: 494 R Axis:   -83 Text Interpretation:  Sinus rhythm RSR' in V1 or V2, probably normal variant Left ventricular hypertrophy Inferior infarct, old Artifact No significant change since last tracing Confirmed by Dorie Rank [phone]) on 05/17/2019 4:37:53 PM  Final diagnoses:  Acute pain of right shoulder      Dorie Rank, MD 05/21/19 [phone]

## 2019-05-18 ENCOUNTER — Inpatient Hospital Stay (HOSPITAL_COMMUNITY): Payer: Medicare Other

## 2019-05-18 ENCOUNTER — Encounter (HOSPITAL_COMMUNITY): Payer: Self-pay | Admitting: Family Medicine

## 2019-05-18 LAB — URINALYSIS, ROUTINE W REFLEX MICROSCOPIC
Bilirubin Urine: NEGATIVE
Glucose, UA: NEGATIVE mg/dL
Ketones, ur: NEGATIVE mg/dL
Leukocytes,Ua: NEGATIVE
Nitrite: NEGATIVE
Protein, ur: 30 mg/dL — AB
Specific Gravity, Urine: 1.006 (ref 1.005–1.030)
pH: 7 (ref 5.0–8.0)

## 2019-05-18 LAB — BASIC METABOLIC PANEL
Anion gap: 9 (ref 5–15)
BUN: 14 mg/dL (ref 8–23)
CO2: 20 mmol/L — ABNORMAL LOW (ref 22–32)
Calcium: 8.3 mg/dL — ABNORMAL LOW (ref 8.9–10.3)
Chloride: 83 mmol/L — ABNORMAL LOW (ref 98–111)
Creatinine, Ser: 0.61 mg/dL (ref 0.44–1.00)
GFR calc Af Amer: >60 mL/min (ref >60)
GFR calc non Af Amer: >60 mL/min (ref >60)
Glucose, Bld: 107 mg/dL — ABNORMAL HIGH (ref 70–99)
Potassium: 3.4 mmol/L — ABNORMAL LOW (ref 3.5–5.1)
Sodium: 112 mmol/L — CL (ref 135–145)

## 2019-05-18 LAB — CBC
HCT: 27.1 % — ABNORMAL LOW (ref 36.0–46.0)
Hemoglobin: 9.7 g/dL — ABNORMAL LOW (ref 12.0–15.0)
MCH: 31.6 pg (ref 26.0–34.0)
MCHC: 35.8 g/dL (ref 30.0–36.0)
MCV: 88.3 fL (ref 80.0–100.0)
Platelets: 290 10*3/uL (ref 150–400)
RBC: 3.07 MIL/uL — ABNORMAL LOW (ref 3.87–5.11)
RDW: 11.9 % (ref 11.5–15.5)
WBC: 9.4 10*3/uL (ref 4.0–10.5)
nRBC: 0 % (ref 0.0–0.2)

## 2019-05-18 LAB — CORTISOL-AM, BLOOD: Cortisol - AM: 23.8 ug/dL — ABNORMAL HIGH (ref 6.7–22.6)

## 2019-05-18 LAB — MAGNESIUM: Magnesium: 1.8 mg/dL (ref 1.7–2.4)

## 2019-05-18 LAB — SODIUM
Sodium: 119 mmol/L — CL (ref 135–145)
Sodium: 121 mmol/L — ABNORMAL LOW (ref 135–145)
Sodium: 122 mmol/L — ABNORMAL LOW (ref 135–145)

## 2019-05-18 LAB — PHOSPHORUS: Phosphorus: 3.4 mg/dL (ref 2.5–4.6)

## 2019-05-18 LAB — MRSA PCR SCREENING: MRSA by PCR: NEGATIVE

## 2019-05-18 MED ORDER — ORAL CARE MOUTH RINSE
15.0000 mL | Freq: Two times a day (BID) | OROMUCOSAL | Status: DC
  Administered 2019-05-18 – 2019-05-29 (×22): 15 mL via OROMUCOSAL

## 2019-05-18 MED ORDER — GABAPENTIN 100 MG PO CAPS
100.0000 mg | ORAL_CAPSULE | Freq: Two times a day (BID) | ORAL | Status: AC
  Administered 2019-05-18 – 2019-05-19 (×2): 100 mg via ORAL
  Filled 2019-05-18 (×2): qty 1

## 2019-05-18 MED ORDER — SODIUM CHLORIDE 0.9 % IV SOLN
INTRAVENOUS | Status: DC
  Administered 2019-05-18 – 2019-05-20 (×2): via INTRAVENOUS

## 2019-05-18 MED ORDER — ACETAMINOPHEN 325 MG PO TABS
650.0000 mg | ORAL_TABLET | Freq: Four times a day (QID) | ORAL | Status: DC | PRN
  Administered 2019-05-18 – 2019-05-29 (×32): 650 mg via ORAL
  Filled 2019-05-18 (×32): qty 2

## 2019-05-18 NOTE — Progress Notes (Signed)
CRITICAL VALUE ALERT  Critical Value:  Sodium 119 Date & Time Notified: 05/18/19 @ 12:44  Provider Notified: Dr. Halford Chessman  Orders Received/Actions taken: No orders received

## 2019-05-18 NOTE — Progress Notes (Signed)
CRITICAL VALUE ALERT  Critical Value:  Na 113  Date & Time Notied:  7/28  0009  Provider Notified: Dr. Lucile Shutters via Elizebeth Brooking  Orders Received/Actions taken: awaiting orders

## 2019-05-18 NOTE — Progress Notes (Signed)
[place] Progress Note Patient Name: Barbara Dennis DOB: Nov 05, 1933 MRN: [id number]   Date of Service  05/18/2019  HPI/Events of Note  Serum sodium up by 3 meq over the past 4 hours which is slightly rapid (ideally 0.5 meq per hour)  eICU Interventions  No intervention for now, will monitor over the next 4 hours and modify infusion rate if correction remains aggressive.        [name] 05/18/2019, 12:43 AM

## 2019-05-18 NOTE — Consult Note (Signed)
Reason for Consult:Humerus fx Referring Physician: K Scatliffe  Barbara Dennis is an 85 y.o. female.  HPI: Barbara Dennis has suffered several falls over the past 2 weeks. She was also confused from hyponatremia. She c/o shoulder pain and an x-ray showed a greater tuberosity fx and orthopedic surgery was consulted. She also c/o bilateral leg pain today that seems to be stemming from her knees. She speaks [place] so visit conducted through interpreter though she is a poor historian even so.  Past Medical History:  Diagnosis Date  . Anemia of chronic disease   . GERD (gastroesophageal reflux disease)   . Hypertension   . Mass of right side of neck   . Urinary incontinence     Past Surgical History:  Procedure Laterality Date  . APPENDECTOMY    . BIOPSY  08/24/2018   Procedure: BIOPSY;  Surgeon: Thornton Park, MD;  Location: [facility];  Service: Gastroenterology;;  . ESOPHAGOGASTRODUODENOSCOPY (EGD) WITH PROPOFOL N/A 08/24/2018   Procedure: ESOPHAGOGASTRODUODENOSCOPY (EGD) WITH PROPOFOL;  Surgeon: Thornton Park, MD;  Location: [facility];  Service: Gastroenterology;  Laterality: N/A;    Family History  Problem Relation Age of Onset  . Hypercalcemia Neg Hx   . Osteoporosis Neg Hx     Social History:  reports that she has been smoking cigarettes. She has a 42.00 pack-year smoking history. She has never used smokeless tobacco. She reports that she does not drink alcohol or use drugs.  Allergies: No Known Allergies  Medications: I have reviewed the patient's current medications.  Results for orders placed or performed during the hospital encounter of 05/17/19 (from the past 48 hour(s))  CBC with Differential     Status: Abnormal   Collection Time: 05/17/19  4:55 PM  Result Value Ref Range   WBC 10.5 4.0 - 10.5 K/uL   RBC 3.46 (L) 3.87 - 5.11 MIL/uL   Hemoglobin 11.0 (L) 12.0 - 15.0 g/dL   HCT 30.2 (L) 36.0 - 46.0 %   MCV 87.3 80.0 - 100.0 fL   MCH 31.8 26.0 - 34.0 pg   MCHC  36.4 (H) 30.0 - 36.0 g/dL   RDW 11.8 11.5 - 15.5 %   Platelets 313 150 - 400 K/uL   nRBC 0.0 0.0 - 0.2 %   Neutrophils Relative % 80 %   Neutro Abs 8.5 (H) 1.7 - 7.7 K/uL   Lymphocytes Relative 11 %   Lymphs Abs 1.2 0.7 - 4.0 K/uL   Monocytes Relative 7 %   Monocytes Absolute 0.7 0.1 - 1.0 K/uL   Eosinophils Relative 1 %   Eosinophils Absolute 0.1 0.0 - 0.5 K/uL   Basophils Relative 0 %   Basophils Absolute 0.0 0.0 - 0.1 K/uL   Immature Granulocytes 1 %   Abs Immature Granulocytes 0.07 0.00 - 0.07 K/uL    Comment: Performed at [facility], [address] [place], [place] [address]  Comprehensive metabolic panel     Status: Abnormal   Collection Time: 05/17/19  4:55 PM  Result Value Ref Range   Sodium 109 (LL) 135 - 145 mmol/L    Comment: CRITICAL RESULT CALLED TO, READ BACK BY AND VERIFIED WITH: Thurmond Butts 1754 05/17/2019 D BRADLEY    Potassium 4.1 3.5 - 5.1 mmol/L   Chloride 74 (L) 98 - 111 mmol/L   CO2 22 22 - 32 mmol/L   Glucose, Bld 123 (H) 70 - 99 mg/dL   BUN 17 8 - 23 mg/dL   Creatinine, Ser 0.73 0.44 -  1.00 mg/dL   Calcium 9.5 8.9 - 10.3 mg/dL   Total Protein 7.4 6.5 - 8.1 g/dL   Albumin 3.6 3.5 - 5.0 g/dL   AST 21 15 - 41 U/L   ALT 19 0 - 44 U/L   Alkaline Phosphatase 119 38 - 126 U/L   Total Bilirubin 1.0 0.3 - 1.2 mg/dL   GFR calc non Af Amer >60 >60 mL/min   GFR calc Af Amer >60 >60 mL/min   Anion gap 13 5 - 15    Comment: Performed at [facility], [place] [address], [place], [place] [address]  Sodium     Status: Abnormal   Collection Time: 05/17/19  8:08 PM  Result Value Ref Range   Sodium 110 (LL) 135 - 145 mmol/L    Comment: CRITICAL RESULT CALLED TO, READ BACK BY AND VERIFIED WITH: C WOODRUFF RN AT 2036 ON [id number] BY K FORSYTH Performed at [facility], [place] [address], [place], [place] [address]   Osmolality     Status: Abnormal   Collection Time: 05/17/19  8:08 PM  Result Value Ref Range   Osmolality 242 (LL) 275 - 295 mOsm/kg     Comment: REPEATED TO VERIFY CRITICAL RESULT CALLED TO, READ BACK BY AND VERIFIED WITH: Elsworth Soho 2054 05/17/2019 WBOND Performed at [facility], [place] [address], [place], [place] [address]   SARS Coronavirus 2 (CEPHEID - Performed in [facility] lab), Hosp Order     Status: None   Collection Time: 05/17/19  9:23 PM   Specimen: Nasopharyngeal Swab  Result Value Ref Range   SARS Coronavirus 2 NEGATIVE NEGATIVE    Comment: (NOTE) If result is NEGATIVE SARS-CoV-2 target nucleic acids are NOT DETECTED. The SARS-CoV-2 RNA is generally detectable in upper and lower  respiratory specimens during the acute phase of infection. The lowest  concentration of SARS-CoV-2 viral copies this assay can detect is 250  copies / mL. A negative result does not preclude SARS-CoV-2 infection  and should not be used as the sole basis for treatment or other  patient management decisions.  A negative result may occur with  improper specimen collection / handling, submission of specimen other  than nasopharyngeal swab, presence of viral mutation(s) within the  areas targeted by this assay, and inadequate number of viral copies  (<250 copies / mL). A negative result must be combined with clinical  observations, patient history, and epidemiological information. If result is POSITIVE SARS-CoV-2 target nucleic acids are DETECTED. The SARS-CoV-2 RNA is generally detectable in upper and lower  respiratory specimens dur ing the acute phase of infection.  Positive  results are indicative of active infection with SARS-CoV-2.  Clinical  correlation with patient history and other diagnostic information is  necessary to determine patient infection status.  Positive results do  not rule out bacterial infection or co-infection with other viruses. If result is PRESUMPTIVE POSTIVE SARS-CoV-2 nucleic acids MAY BE PRESENT.   A presumptive positive result was obtained on the submitted specimen  and confirmed  on repeat testing.  While 2019 novel coronavirus  (SARS-CoV-2) nucleic acids may be present in the submitted sample  additional confirmatory testing may be necessary for epidemiological  and / or clinical management purposes  to differentiate between  SARS-CoV-2 and other Sarbecovirus currently known to infect humans.  If clinically indicated additional testing with an alternate test  methodology [phone]) is advised. The SARS-CoV-2 RNA is generally  detectable in upper and lower  respiratory sp ecimens during the acute  phase of infection. The expected result is Negative. Fact Sheet for Patients:  StrictlyIdeas.no Fact Sheet for Healthcare Providers: BankingDealers.co.za This test is not yet approved or cleared by the [place] FDA and has been authorized for detection and/or diagnosis of SARS-CoV-2 by FDA under an Emergency Use Authorization (EUA).  This EUA will remain in effect (meaning this test can be used) for the duration of the COVID-19 declaration under Section 564(b)(1) of the Act, 21 U.S.C. section 360bbb-3(b)(1), unless the authorization is terminated or revoked sooner. Performed at [facility], [place] [address], [place], [place] [address]   Glucose, capillary     Status: Abnormal   Collection Time: 05/17/19 11:07 PM  Result Value Ref Range   Glucose-Capillary 135 (H) 70 - 99 mg/dL  MRSA PCR Screening     Status: None   Collection Time: 05/17/19 11:09 PM   Specimen: Nasopharyngeal  Result Value Ref Range   MRSA by PCR NEGATIVE NEGATIVE    Comment:        The GeneXpert MRSA Assay (FDA approved for NASAL specimens only), is one component of a comprehensive MRSA colonization surveillance program. It is not intended to diagnose MRSA infection nor to guide or monitor treatment for MRSA infections. Performed at [facility], [place] [address], [place], [place] [address]   Sodium     Status: Abnormal    Collection Time: 05/17/19 11:12 PM  Result Value Ref Range   Sodium 113 (LL) 135 - 145 mmol/L    Comment: CRITICAL RESULT CALLED TO, READ BACK BY AND VERIFIED WITH: PARRISH,J RN 05/17/2019 2345 JORDANS Performed at [facility], [place] [address] [place], [place] [address]   Urinalysis, Routine w reflex microscopic     Status: Abnormal   Collection Time: 05/18/19  2:21 AM  Result Value Ref Range   Color, Urine YELLOW YELLOW   APPearance CLOUDY (A) CLEAR   Specific Gravity, Urine 1.006 1.005 - 1.030   pH 7.0 5.0 - 8.0   Glucose, UA NEGATIVE NEGATIVE mg/dL   Hgb urine dipstick SMALL (A) NEGATIVE   Bilirubin Urine NEGATIVE NEGATIVE   Ketones, ur NEGATIVE NEGATIVE mg/dL   Protein, ur 30 (A) NEGATIVE mg/dL   Nitrite NEGATIVE NEGATIVE   Leukocytes,Ua NEGATIVE NEGATIVE   RBC / HPF 0-5 0 - 5 RBC/hpf   Bacteria, UA RARE (A) NONE SEEN   Mucus PRESENT     Comment: Performed at [facility] [address], [place], [place] [address]  CBC     Status: Abnormal   Collection Time: 05/18/19  2:36 AM  Result Value Ref Range   WBC 9.4 4.0 - 10.5 K/uL   RBC 3.07 (L) 3.87 - 5.11 MIL/uL   Hemoglobin 9.7 (L) 12.0 - 15.0 g/dL   HCT 27.1 (L) 36.0 - 46.0 %   MCV 88.3 80.0 - 100.0 fL   MCH 31.6 26.0 - 34.0 pg   MCHC 35.8 30.0 - 36.0 g/dL   RDW 11.9 11.5 - 15.5 %   Platelets 290 150 - 400 K/uL   nRBC 0.0 0.0 - 0.2 %    Comment: Performed at [facility], [place] [address], [place], [place] [address]  Basic metabolic panel     Status: Abnormal   Collection Time: 05/18/19  2:36 AM  Result Value Ref Range   Sodium 112 (LL) 135 - 145 mmol/L    Comment: CRITICAL RESULT CALLED TO, READ BACK  BY AND VERIFIED WITH: Sheral Apley [id number] WILDERK    Potassium 3.4 (L) 3.5 - 5.1 mmol/L    Comment: NO VISIBLE HEMOLYSIS   Chloride 83 (L) 98 - 111 mmol/L   CO2 20 (L) 22 - 32 mmol/L   Glucose, Bld 107 (H) 70 - 99 mg/dL   BUN 14 8 - 23 mg/dL   Creatinine, Ser 0.61 0.44 - 1.00 mg/dL   Calcium  8.3 (L) 8.9 - 10.3 mg/dL   GFR calc non Af Amer >60 >60 mL/min   GFR calc Af Amer >60 >60 mL/min   Anion gap 9 5 - 15    Comment: Performed at [facility] [address], [place], [place] [address]  Magnesium     Status: None   Collection Time: 05/18/19  2:36 AM  Result Value Ref Range   Magnesium 1.8 1.7 - 2.4 mg/dL    Comment: Performed at [facility] [address] [place], [place] [address]  Phosphorus     Status: None   Collection Time: 05/18/19  2:36 AM  Result Value Ref Range   Phosphorus 3.4 2.5 - 4.6 mg/dL    Comment: Performed at [facility] [address], [place], [place] [address]    Dg Shoulder Right  Result Date: 05/17/2019 CLINICAL DATA:  85 year old female with acute pain, non-English-speaking. EXAM: RIGHT SHOULDER - 2+ VIEW COMPARISON:  Chest radiographs 07/20/2018 and earlier. FINDINGS: Comminuted fracture through the greater tuberosity of the right humerus. Mildly displaced fragments. No associated glenohumeral joint dislocation. The humeral neck appears intact. The right clavicle and scapula appear intact. There are chronic right lateral rib fractures. Stable visible right lung. Chronic calcified mediastinal lymph nodes. There are thoracic spine compression fractures which are new or increased since 2019 (especially in the lower spine). IMPRESSION: 1. Comminuted fracture of the greater tuberosity of the right humerus. No glenohumeral joint dislocation. 2. Thoracic spine compression fractures are new or increased since 2019. Chronic right rib fractures. Electronically Signed   By: Genevie Ann M.D.   On: 05/17/2019 16:14   Ct Head Wo Contrast  Result Date: 05/17/2019 CLINICAL DATA:  Fall.  Head injury EXAM: CT HEAD WITHOUT CONTRAST CT CERVICAL SPINE WITHOUT CONTRAST TECHNIQUE: Multidetector CT imaging of the head and cervical spine was performed following the standard protocol without intravenous contrast. Multiplanar CT image reconstructions of the  cervical spine were also generated. COMPARISON:  CT 08/18/2018 FINDINGS: CT HEAD FINDINGS Brain: Moderate atrophy. Chronic microvascular ischemic changes in the white matter left greater than right. Chronic ischemia left internal capsule unchanged. Negative for acute infarct, hemorrhage, mass. No midline shift. Vascular: Negative for hyperdense vessel. Atherosclerotic calcification cavernous carotid bilaterally. Skull: Negative for fracture Sinuses/Orbits: Negative Other: None CT CERVICAL SPINE FINDINGS Alignment: 4 mm retrolisthesis C5-6 Skull base and vertebrae: Negative for fracture Soft tissues and spinal canal: Atherosclerotic calcification carotid artery bilaterally. No soft tissue mass Disc levels: Moderately large central disc protrusion at C3-4 causing spinal stenosis. Advanced facet degeneration on the left at C4-5 causing left foraminal encroachment and mild spinal stenosis. Moderate to severe spinal stenosis at C5-6 due to retrolisthesis and advanced spondylosis as well as bilateral facet degeneration. Right foraminal disc protrusion at C6-7. Upper chest: Negative Other: None IMPRESSION: 1. No acute intracranial abnormality. Atrophy with chronic microvascular ischemia 2. Negative for cervical spine fracture 3. Advanced cervical spondylosis most severe at C5-6 with moderate to severe spinal stenosis. Electronically Signed   By: Franchot Gallo  M.D.   On: 05/17/2019 19:35   Ct Cervical Spine Wo Contrast  Result Date: 05/17/2019 CLINICAL DATA:  Fall.  Head injury EXAM: CT HEAD WITHOUT CONTRAST CT CERVICAL SPINE WITHOUT CONTRAST TECHNIQUE: Multidetector CT imaging of the head and cervical spine was performed following the standard protocol without intravenous contrast. Multiplanar CT image reconstructions of the cervical spine were also generated. COMPARISON:  CT 08/18/2018 FINDINGS: CT HEAD FINDINGS Brain: Moderate atrophy. Chronic microvascular ischemic changes in the white matter left greater than  right. Chronic ischemia left internal capsule unchanged. Negative for acute infarct, hemorrhage, mass. No midline shift. Vascular: Negative for hyperdense vessel. Atherosclerotic calcification cavernous carotid bilaterally. Skull: Negative for fracture Sinuses/Orbits: Negative Other: None CT CERVICAL SPINE FINDINGS Alignment: 4 mm retrolisthesis C5-6 Skull base and vertebrae: Negative for fracture Soft tissues and spinal canal: Atherosclerotic calcification carotid artery bilaterally. No soft tissue mass Disc levels: Moderately large central disc protrusion at C3-4 causing spinal stenosis. Advanced facet degeneration on the left at C4-5 causing left foraminal encroachment and mild spinal stenosis. Moderate to severe spinal stenosis at C5-6 due to retrolisthesis and advanced spondylosis as well as bilateral facet degeneration. Right foraminal disc protrusion at C6-7. Upper chest: Negative Other: None IMPRESSION: 1. No acute intracranial abnormality. Atrophy with chronic microvascular ischemia 2. Negative for cervical spine fracture 3. Advanced cervical spondylosis most severe at C5-6 with moderate to severe spinal stenosis. Electronically Signed   By: Franchot Gallo M.D.   On: 05/17/2019 19:35   Ct Pelvis Wo Contrast  Result Date: 05/17/2019 CLINICAL DATA:  Fall, pelvic pain EXAM: CT PELVIS WITHOUT CONTRAST TECHNIQUE: Multidetector CT imaging of the pelvis was performed following the standard protocol without intravenous contrast. COMPARISON:  Right hip/pelvic radiographs dated 05/17/2019 FINDINGS: Urinary Tract:  Bladder is within normal limits. Bowel:  Visualized bowel is unremarkable. Vascular/Lymphatic: Atherosclerotic calcifications of the infrarenal abdominal aorta and bilateral iliac vessels. No evidence of lymphadenopathy. Reproductive:  Uterus is within normal limits. Bilateral ovaries are within normal limits. Other:  No pelvic ascites. Musculoskeletal: No evidence of fracture. Specifically, the  bilateral hips and pelvis are intact. IMPRESSION: No evidence of pelvic fracture. Electronically Signed   By: Julian Hy M.D.   On: 05/17/2019 19:52   Dg Hip Unilat W Or Wo Pelvis 2-3 Views Right  Result Date: 05/17/2019 CLINICAL DATA:  85 year old female with right hip pain. Non-English-speaking. EXAM: DG HIP (WITH OR WITHOUT PELVIS) 2-3V RIGHT COMPARISON:  CT Abdomen and Pelvis 07/14/2018. Hip radiographs 12/17/2018. FINDINGS: Extensive iliofemoral calcified atherosclerosis. Numerous phleboliths in the pelvis. The pelvis appears stable and intact. Femoral heads are normally located. Hip joint spaces are stable. Grossly intact proximal left femur. The proximal right femur appears intact. Stable visible bowel gas pattern. IMPRESSION: No acute osseous abnormality identified about the right hip or pelvis. Electronically Signed   By: Genevie Ann M.D.   On: 05/17/2019 16:17    Review of Systems  Constitutional: Negative for weight loss.  HENT: Negative for ear discharge, ear pain, hearing loss and tinnitus.   Eyes: Negative for blurred vision, double vision, photophobia and pain.  Respiratory: Negative for cough, sputum production and shortness of breath.   Cardiovascular: Negative for chest pain.  Gastrointestinal: Negative for abdominal pain, nausea and vomiting.  Genitourinary: Negative for dysuria, flank pain, frequency and urgency.  Musculoskeletal: Positive for joint pain (Right shoulder, bilateral knees). Negative for back pain, falls, myalgias and neck pain.  Neurological: Negative for dizziness, tingling, sensory change, focal weakness, loss of consciousness and  headaches.  Endo/Heme/Allergies: Does not bruise/bleed easily.  Psychiatric/Behavioral: Negative for depression, memory loss and substance abuse. The patient is not nervous/anxious.    Blood pressure (!) 141/73, pulse 95, temperature (!) 97.5 F (36.4 C), temperature source Oral, resp. rate (!) 29, height 5' (1.524 m), weight  43.7 kg, SpO2 96 %. Physical Exam  Constitutional: She appears well-developed and well-nourished. No distress.  HENT:  Head: Normocephalic and atraumatic.  Eyes: Conjunctivae are normal. Right eye exhibits no discharge. Left eye exhibits no discharge. No scleral icterus.  Neck: Normal range of motion.  Cardiovascular: Normal rate and regular rhythm.  Respiratory: Effort normal. No respiratory distress.  Musculoskeletal:     Comments: Right shoulder, elbow, wrist, digits- no skin wounds, mod TTP, no instability, no blocks to motion  Sens  Ax/R/M/U intact  Mot   Ax/ R/ PIN/ M/ AIN/ U intact  Rad 2+  BLE No traumatic wounds, ecchymosis, or rash  Mild TTP knees  No knee or ankle effusion  Knee stable to varus/ valgus and anterior/posterior stress  Sens DPN, SPN, TN intact  Motor EHL, ext, flex, evers 5/5  DP 2+, PT 2+, No significant edema   Neurological: She is alert.  Skin: Skin is warm and dry. She is not diaphoretic.  Psychiatric: She has a normal mood and affect. Her behavior is normal.    Assessment/Plan: Right humerus fx -- Will check CT but likely non-operative management with NWB in sling, motion encouraged. Bilateral knee pain -- Will check x-rays Multiple medical problems including hyponatremia, neck mass, dysphagia, HTN, and cervical spondylosis -- per primary team    Lisette Abu, PA-C Orthopedic Surgery [phone] 05/18/2019, 10:33 AM

## 2019-05-18 NOTE — Progress Notes (Addendum)
NAME:  Barbara Dennis, MRN:  [id number], DOB:  Oct 07, 1934, LOS: 1 ADMISSION DATE:  05/17/2019, CONSULTATION DATE:  05/17/19 REFERRING MD:  Tomi Bamberger, CHIEF COMPLAINT:  AMS, Falls    Brief History   Barbara Dennis is a 85 y.o. female who was admitted 7/27 with AMS and falls.  Found to have severe hyponatremia with Na 109 requiring hypertonic saline.  History of present illness   Pt is altered and [place] speaking; therefore, this HPI is obtained from chart review as well as interview with pt's friend / roommate using the Str[employer] interpretation software. Barbara Dennis is a 85 y.o. female who has a PMH including but not limited to HTN, right neck mass, urinary incontinence, GERD, anemia.  She presented to [facility] 7/27 after several falls over the past 1 - [redacted] weeks along with wosrening confusion / altered mental status.  In ED, labs revealed severe hyponatremia with Na 109.  Friend states she has had falls in the past that have seemed mechanical.  She also states she has had hyponatremia in the past and chart review shows that she required admission 12/17/18 through 12/19/18 for this which was believed to be hypovolemic in origin.  There has been some concern for underlying SIADH possibly from chronic right neck mass which has been followed with serial CT's as an outpatient (no biopsies as of yet).  [name] friend denies any recent fevers/chills/sweats, chest pain, cough, N/V/D, abd pain, myalgias.  Has had some right hip pain from falls.  She has not been walking for the past 1 week or so due to frequent falls; however, prior to this, she was able to walk independently with a walker.  She lives at home with friend and friends husband. Friend does state pt has had decreased PO intake due to dysphagia.  "She coughs and spits up a lot of the food that she eats (per translator)".  Friend states that due to this, pt has been hesitant to eat / drink much.  She has not been evaluated for this in the past.  Per Home Health  packet on 12/21/18 Past Medical History  HTN, right neck mass, urinary incontinence, GERD, anemia, COPD, nicotine dependence, cervical degeneration, age related osteoporosis w/o pathalogical fracture.   Significant Hospital Events   7/27 > admit.  Consults:  Ortho.  Procedures:  None.  Significant Diagnostic Tests:  R shoulder xray 7/27 > comminuted fx of right humerus. R hip xray 7/27 > negative. CT head / Cspine 7/27 > no acute process.  Advanced cervical spondylosis most severe at C5-C6 with severe spinal stenosis. CT pelvis 7/27 > negative.  Micro Data:  None.  Antimicrobials:  None.   Interim history/subjective:  Patient doing well. Is confused this morning and a&o x 2. She continues to ask for her mom. She reports that she lives alone with her mother.  Barbara Dennis reports that shes had pain in her legs x months. Additionally, she reports arm pain after falling weeks ago.      Objective   Temp:  [97.5 F (36.4 C)-98 F (36.7 C)] 97.5 F (36.4 C) (07/28 0739) Pulse Rate:  [76-94] 82 (07/28 0600) Cardiac Rhythm: Normal sinus rhythm (07/28 0400) Resp:  [13-30] 24 (07/28 0600) BP: (89-174)/(51-97) 139/75 (07/28 0600) SpO2:  [89 %-100 %] 98 % (07/28 0600) Weight:  [43.7 kg-45.4 kg] 43.7 kg (07/28 0444)   Intake/Output      07/27 0701 - 07/28 0700 07/28 0701 - 07/29 0700   I.V. (mL/kg)  218.2 (5)    IV Piggyback 100.1    Total Intake(mL/kg) 318.4 (7.3)    Net +318.4         Urine Occurrence 1 x     Filed Weights   05/17/19 1416 05/18/19 0444  Weight: 45.4 kg 43.7 kg   Examination: General: Elderly [place] female sitting up in bed, NAD.  HENT: NCAT Lungs: CTAB Cardiovascular: RRR.  Abdomen: Soft, NTND, + BS  Extremities: Warm. Right foot with ecchymoses on dorsal surface. Right arm in arm sling.  Neuro: Alert and oriented to self and buildling. Unable to answer year, president, and city. GU: deferred  Patient Lines/Drains/Airways Status   Active  Line/Drains/Airways    Name:   Placement date:   Placement time:   Site:   Days:   Peripheral IV 05/17/19 Left Antecubital   05/17/19    1647    Antecubital   1   Peripheral IV 05/18/19 Left Forearm   05/18/19    0108    Forearm   less than 1   External Urinary Catheter   05/18/19    0001    -   less than 1   Pressure Injury   -    -              amLODipine, 2.5 mg, Oral, Daily heparin, 5,000 Units, Subcutaneous, Q8H lisinopril, 40 mg, Oral, Daily mouth rinse, 15 mL, Mouth Rinse, BID   Resolved Hospital Problem list        Assessment & Plan:  Active Problems:   Altered mental status   Severe hyponatremia, Acute on chronic.  Baseline in 120's - 130's. Unclear etiology. DDX: SIADH, right neck mass. Vs adrenal insufficiency.  -- continue 3% NS  -- Q4 hour Na check  -- f/u Posm, UNa, Urosm  -- holding home oxybutinyn   Right neck mass  Currently undergoing intermittent surveillance. Per CT read:  Mild interval increase in size of right neck mass posterior to right submandibular gland and left deep parotid gland mass. No mass noted on CT cervical spine yesterday on admission. Not palpated on physical exam. Has never been biopsied. PT is a current smoker making her high risk for cancer.  -- o/p f/u   Dysphagia  Known history of GERD per EGD - 2019  Possible from neck mass? Started on ommeprazole in December 2019 for muscoal changes found on EGD. Biopsies sent to path, but unable to see on chart.  When drinking -- SLP eval  -- swallow eval/ barium  -- pt on 40mg  ommeprazole at home, will continue when taking PO  -- Consult radiologist about read   Hx HTN  -- continue home amlodipine and lisinopril   Osteoporosis T score -4.5 With acute Right humerus Fx  -- consulted ortho  -- f/u oupatient   Frequent Falls Receives home health at this time.   -- PT / OT   Known advanced cervical spondylosis with moderate to severe spnial steonis  -- PT/OT -- o/p f/u   Best  practice:  Diet: NPO Pain/Anxiety/Delirium protocol (if indicated): acetaminophen  DVT prophylaxis: heparin  GI prophylaxis: na Glucose control: na Mobility: OOB with help only  Code Status: Full Code  Family Communication:  Primary Emergency Contact: [place], Home Phone: [phone]  Disposition: ICU until improvement in hyponatremia   Labs   CBC: Recent Labs  Lab 05/17/19 1655 05/18/19 0236  WBC 10.5 9.4  NEUTROABS 8.5*  --   HGB 11.0* 9.7*  HCT 30.2* 27.1*  MCV 87.3 88.3  PLT 313 381    Basic Metabolic Panel: Recent Labs  Lab 05/17/19 1655 05/17/19 2008 05/17/19 2312 05/18/19 0236  NA 109* 110* 113* 112*  K 4.1  --   --  3.4*  CL 74*  --   --  83*  CO2 22  --   --  20*  GLUCOSE 123*  --   --  107*  BUN 17  --   --  14  CREATININE 0.73  --   --  0.61  CALCIUM 9.5  --   --  8.3*  MG  --   --   --  1.8  PHOS  --   --   --  3.4   GFR: Estimated Creatinine Clearance: 36.8 mL/min (by C-G formula based on SCr of 0.61 mg/dL). Recent Labs  Lab 05/17/19 1655 05/18/19 0236  WBC 10.5 9.4    Liver Function Tests: Recent Labs  Lab 05/17/19 1655  AST 21  ALT 19  ALKPHOS 119  BILITOT 1.0  PROT 7.4  ALBUMIN 3.6   CBG: Recent Labs  Lab 05/17/19 2307  GLUCAP 135*    Review of Systems:   ROS   Past Medical History  She  has a past medical history of Anemia of chronic disease, GERD (gastroesophageal reflux disease), Hypertension, Mass of right side of neck, and Urinary incontinence.   Surgical History    Past Surgical History:  Procedure Laterality Date  . APPENDECTOMY    . BIOPSY  08/24/2018   Procedure: BIOPSY;  Surgeon: Thornton Park, MD;  Location: [facility];  Service: Gastroenterology;;  . ESOPHAGOGASTRODUODENOSCOPY (EGD) WITH PROPOFOL N/A 08/24/2018   Procedure: ESOPHAGOGASTRODUODENOSCOPY (EGD) WITH PROPOFOL;  Surgeon: Thornton Park, MD;  Location: [facility];  Service: Gastroenterology;  Laterality: N/A;     Social History    reports that she has been smoking cigarettes. She has a 42.00 pack-year smoking history. She has never used smokeless tobacco. She reports that she does not drink alcohol or use drugs.   Family History   Her family history is negative for Hypercalcemia and Osteoporosis.   Allergies No Known Allergies   Home Medications  12/21/18  Prior to Admission medications   Medication Sig Start Date End Date Taking? Authorizing Provider  acetaminophen (TYLENOL) 325 MG tablet Take 325-650 mg by mouth 3 (three) times daily as needed for headache (pain).   Yes [provider]  amLODipine (NORVASC) 2.5 MG tablet Take 1 tablet (2.5 mg total) by mouth daily. 12/19/18 12/19/19 Yes Georgette Shell, MD  bismuth subsalicylate (PEPTO BISMOL) 262 MG chewable tablet Chew 262 mg by mouth 4 (four) times daily as needed (constipation).   Yes [provider]  cholecalciferol (VITAMIN D3) 25 MCG (1000 UT) tablet Take 1,000 Units by mouth daily.   Yes [provider]  ibandronate (BONIVA) 150 MG tablet Take 1 tablet (150 mg total) by mouth every 30 (thirty) days. 03/22/19  Yes Renato Shin, MD  lisinopril (PRINIVIL,ZESTRIL) 40 MG tablet Take 1 tablet (40 mg total) by mouth daily. 12/17/18  Yes Sagardia, Ines Bloomer, MD  Multiple Minerals-Vitamins (CAL-MAG-ZINC-D) TABS Take 1 tablet by mouth daily.   Yes [provider]  Multiple Vitamin (MULTIVITAMIN WITH MINERALS) TABS tablet Take 1 tablet by mouth daily. 09/18/18  Yes Hennie Duos, MD  omeprazole (PRILOSEC) 20 MG capsule TAKE 2 CAPSULES BY MOUTH TWICE DAILY BEFORE A MEAL Patient taking differently: Take 20 mg by mouth 2 (two) times daily before a  meal.  09/18/18  Yes Hennie Duos, MD  OVER THE COUNTER MEDICATION Take 1 tablet by mouth 2 (two) times daily as needed (pain/inflammation). Anti-inflammatory from [place]   Yes [provider]  oxybutynin (DITROPAN-XL) 10 MG 24 hr tablet Take 10 mg by mouth at bedtime.   11/12/18  Yes [provider]  acetaminophen (TYLENOL) 500 MG tablet Take 2 tablets (1,000 mg total) by mouth every 8 (eight) hours. Patient not taking: Reported on 05/17/2019 07/23/18   Jill Alexanders, PA-C  albuterol (PROVENTIL HFA;VENTOLIN HFA) 108 (90 Base) MCG/ACT inhaler Inhale 2 puffs into the lungs every 4 (four) hours as needed for wheezing or shortness of breath. Patient not taking: Reported on 05/17/2019 09/18/18   Hennie Duos, MD  calcium elemental as carbonate (TUMS ULTRA 1000) 400 MG chewable tablet Chew 3 tablets (1,200 mg total) by mouth 2 times daily at 12 noon and 4 pm. Patient not taking: Reported on 05/17/2019 09/18/18   Hennie Duos, MD  triamcinolone ointment (KENALOG) 0.5 % Apply 1 application topically 4 (four) times daily. As needed for rash. Patient not taking: Reported on 05/06/2019 03/22/19   Renato Shin, MD      Wilber Oliphant, M.D.  PGY-2  Family Medicine  05/18/2019 8:37 AM

## 2019-05-18 NOTE — Progress Notes (Addendum)
Modified Barium Swallow Progress Note  Patient Details  Name: Barbara Dennis MRN: [id number] Date of Birth: 1934-10-14  Today's Date: 05/18/2019  Modified Barium Swallow completed.  Full report located under Chart Review in the Imaging Section.  Brief recommendations include the following:  Clinical Impression  Pt has a moderate oral dysphagia with suspected primary esophageal component. She has oral holding, rocking boluses back and forth on her tongue and needing Min cues to initiate full posterior transit. Mastication is prolonged, and she needs encouragement to make attempts. Taking small bites, she still needs to take brief breaks from chewing. Her pharyngeal swallow is triggered in a timely manner, with good airway protection and clearance. She had what appeared to be residual barium in the esophagus with retrograde flow, although MD was not present to confirm. Recommend starting with Dys 1 diet and thin liquids. Will f/u for potential to advance solids further as her overall strength and endurance improve. ` ([employer] interpreter used throughout study).   Swallow Evaluation Recommendations   Recommended Consults: Consider esophageal assessment   SLP Diet Recommendations: Dysphagia 1 (Puree) solids;Thin liquid   Liquid Administration via: Cup;Straw   Medication Administration: Whole meds with liquid   Supervision: Patient able to self feed;Intermittent supervision to cue for compensatory strategies   Compensations: Slow rate;Small sips/bites;Follow solids with liquid   Postural Changes: Seated upright at 90 degrees;Remain semi-upright after after feeds/meals (Comment)   Oral Care Recommendations: Oral care BID        Venita Sheffield Jermine Bibbee 05/18/2019,2:06 PM   Pollyann Glen, M.A. [place] Acute Environmental education officer [phone] Office [phone]

## 2019-05-18 NOTE — Progress Notes (Signed)
Spoke with patient's family/caretakers over the phone. Used interpretor services via telephone.   Regarding falls:  First fall was when she hurt her arm, three weeks ago. At that time, no other issues. Then contacted family doctor to see if there was anything that they could do.  A couple weeks later, for the second time, she was drinking water, took her hands off of her walker and that's when she fell. At that time, she was having difficulty using her right leg. Then caretakers put her into a  wheel chair from that point on. After that, patient had constipation which resolved with laxatives.  Third time, slipped and fell in the shower, and this was the fall that led to her current hospitalization.  In December 2019, pt sustained a head injury and required rehab x 3 months, after which she was discharged home to family. Since, she has been doing well.  >> PT/OT  Mental Status: At baseline, patient has note shown any signs of dementia per daughter. However, she has noticed a recent acute change after second fall. Daughter denies any facial droop or inability to use one side of body. Around the time of the second fall, caretaker noticed that she was closing her left eye a lot. Pt report discomfort of her left eye only. was mentioned to the PCP and no further follow up was obtained.  >> remains confused, will continue to work on Na.   PO intake:  Eating very well. Her weight was 92 lbs and increased to 97 lbs. In fact, patient was put on medication to help with digestion and care taker thought she was over eating.  But ever since the fall, having issue with the appetite. Also, having issues with vomiting after food. Concerned there is also a problem with the stomach.  When drinking, she ends up "sneezing it out".  >> SLP on board. dysphagia 1 diet   Neck Mass Seen in 2015 during soft tissue neck scan. "In the right upper neck near the angle of the mandible there is a solid heterogeneously enhancing  but smoothly marginated 28 x 17 x 34 mm mass (AP by transverse by CC). Which is located very near but not definitely within the right parotid space. This may be an abnormal right level 2 lymph node. It anterior to the right sternocleidomastoid muscle. It has mass effect on the right submandibular gland, but appears most likely to be separate. There is a nearby a small but mildly hyper enhancing right level IIa node measuring up to 9 mm (series 3, image 37). Otherwise, the right parotid space is negative." Noted in CT scan in 2019 and yesterday's with no change. Preivously wanted to do surveillance but recently requested to have biopsied. Family was waiting referral, but have not gotten any calls yet. Family requesting biopsy  >> will consult ENT   Wilber Oliphant, M.D.  PGY-2  Family Medicine  [phone] 05/18/2019 2:45 PM

## 2019-05-18 NOTE — Evaluation (Addendum)
Physical Therapy Evaluation Patient Details Name: Barbara Dennis MRN: [id number] DOB: [birth year] Today's Date: 05/18/2019   History of Present Illness  Pt adm with hyponatremia and frequent falls. Found to have rt shoulder fx. PMH - htn, copd, hyponatremia, rt rib fx's, T12 fx, frequent falls  Clinical Impression  Pt presents to PT with very limited mobility due to pain which she reports is in her feet. Pt poor historian even with translator. Pt with definite grimacing with movement and unable to tolerate getting to the EOB. However grimacing appeared when trying to sit trunk upright and feet were off EOB and not in contact with anything. Wonder if there is some back pain/soreness as well as rt shoulder pain. At this time recommend SNF. Per notes it appears pt may have had a prior SNF stay.     Follow Up Recommendations SNF;Supervision/Assistance - 24 hour    Equipment Recommendations  Other (comment)(To be determined)    Recommendations for Other Services       Precautions / Restrictions Restrictions Weight Bearing Restrictions: Yes RUE Weight Bearing: Non weight bearing      Mobility  Bed Mobility Overal bed mobility: Needs Assistance Bed Mobility: Supine to Sit;Sit to Supine     Supine to sit: Total assist Sit to supine: Max assist   General bed mobility comments: Pt unable to tolerate full upright sitting due to reports of foot pain. Feet were off EOB when attempted to elevate trunk into sitting. Pt grimacing an reporting pain. When questioned if pain was in her back, shoulder or feet she stated feet. Pt lifted legs back into bed.  Transfers                    Ambulation/Gait                Stairs            Wheelchair Mobility    Modified Rankin (Stroke Patients Only)       Balance                                             Pertinent Vitals/Pain Pain Assessment: Faces Faces Pain Scale: Hurts even more Pain Location:  feet and rt shoulder Pain Descriptors / Indicators: Grimacing;Guarding Pain Intervention(s): Limited activity within patient's tolerance;Monitored during session;Repositioned    Home Living Family/patient expects to be discharged to:: Private residence Living Arrangements: Non-relatives/Friends Available Help at Discharge: Friend(s);Available PRN/intermittently           Home Equipment: Walker - 2 wheels;Wheelchair - manual Additional Comments: Pt poor historian. Some information from prior visit or chart    Prior Function Level of Independence: Needs assistance   Gait / Transfers Assistance Needed: Per chart was amb with walker. Pt with frequent falls.            Hand Dominance   Dominant Hand: Right    Extremity/Trunk Assessment   Upper Extremity Assessment Upper Extremity Assessment: Defer to OT evaluation    Lower Extremity Assessment Lower Extremity Assessment: Generalized weakness;RLE deficits/detail;LLE deficits/detail RLE Deficits / Details: active movement but pt limited by pain LLE Deficits / Details: active movement but pt limited by pain       Communication   Communication: Prefers language other than English  Cognition Arousal/Alertness: Awake/alert Behavior During Therapy: WFL for tasks assessed/performed Overall Cognitive Status: No family/caregiver  present to determine baseline cognitive functioning                                 General Comments: Conversing with interpreter but vague historian and focused on foot pain      General Comments General comments (skin integrity, edema, etc.): Interpreter #[id number] used throughout    Exercises     Assessment/Plan    PT Assessment Patient needs continued PT services  PT Problem List Decreased strength;Decreased activity tolerance;Decreased balance;Decreased mobility;Pain       PT Treatment Interventions DME instruction;Gait training;Therapeutic activities;Functional mobility  training;Therapeutic exercise;Balance training;Patient/family education    PT Goals (Current goals can be found in the Care Plan section)  Acute Rehab PT Goals Patient Stated Goal: Pt didn't state PT Goal Formulation: With patient Time For Goal Achievement: 06/01/19 Potential to Achieve Goals: Fair    Frequency Min 3X/week   Barriers to discharge        Co-evaluation               AM-PAC PT "6 Clicks" Mobility  Outcome Measure Help needed turning from your back to your side while in a flat bed without using bedrails?: Total Help needed moving from lying on your back to sitting on the side of a flat bed without using bedrails?: Total Help needed moving to and from a bed to a chair (including a wheelchair)?: Total Help needed standing up from a chair using your arms (e.g., wheelchair or bedside chair)?: Total Help needed to walk in hospital room?: Total Help needed climbing 3-5 steps with a railing? : Total 6 Click Score: 6    End of Session   Activity Tolerance: Patient limited by pain Patient left: in bed;with call bell/phone within reach Nurse Communication: Mobility status PT Visit Diagnosis: Other abnormalities of gait and mobility (R26.89);Pain Pain - Right/Left: Right(bilateral) Pain - part of body: Shoulder;Ankle and joints of foot    Time: 4431-5400 PT Time Calculation (min) (ACUTE ONLY): 15 min   Charges:   PT Evaluation $PT Eval Moderate Complexity: [place] Pager [phone] Office [place] 05/18/2019, 3:56 PM

## 2019-05-18 NOTE — Evaluation (Addendum)
Clinical/Bedside Swallow Evaluation Patient Details  Name: Barbara Dennis MRN: [id number] Date of Birth: 23-Feb-1934  Today's Date: 05/18/2019 Time: SLP Start Time (ACUTE ONLY): 4098 SLP Stop Time (ACUTE ONLY): 0911 SLP Time Calculation (min) (ACUTE ONLY): 18 min  Past Medical History:  Past Medical History:  Diagnosis Date  . Anemia of chronic disease   . GERD (gastroesophageal reflux disease)   . Hypertension   . Mass of right side of neck   . Urinary incontinence    Past Surgical History:  Past Surgical History:  Procedure Laterality Date  . APPENDECTOMY    . BIOPSY  08/24/2018   Procedure: BIOPSY;  Surgeon: Thornton Park, MD;  Location: [facility];  Service: Gastroenterology;;  . ESOPHAGOGASTRODUODENOSCOPY (EGD) WITH PROPOFOL N/A 08/24/2018   Procedure: ESOPHAGOGASTRODUODENOSCOPY (EGD) WITH PROPOFOL;  Surgeon: Thornton Park, MD;  Location: [facility];  Service: Gastroenterology;  Laterality: N/A;   HPI:  Pt is an 85 y.o. female who was admitted 7/27 with AMS and falls, found to have severe hyponatremia with Na 109 requiring hypertonic saline. PMH significant for urinary incontinence, HTN , right neck mass (followed by serial CT scans, no biopsy), GERD and anemia. Per PCCM note, pt's friend shares that the pt has been having trouble swallowing, coughing and spitting up a lot of the food that she eats.    Assessment / Plan / Recommendation Clinical Impression  Pt has oral holding followed by multiple, audible swallows given bites of purees and thin liquids, but no overt signs of aspiration or regurgitation are noted. She does not want to consume much, reporting that she feels uncomfortable after eating/drinking and pointing toward her esophagus/stomach. She is not a clear historian, denying that she brings any food back up or having any trouble swallowing at all, but she also says she coughs a lot. Given concern for R neck mass and dysphagia PTA, recommend proceeding with  work-up while she is here. She is agreeable to participate in MBS later today. Until then, would allow snacks of purees and thin liquids from the floor stock. Tele-interpreter Hyman Bible [phone]) was utilized throughout evaluation.  SLP Visit Diagnosis: Dysphagia, unspecified (R13.10)    Aspiration Risk  Mild aspiration risk    Diet Recommendation Other (Comment)(snacks of purees and thin liquids)   Liquid Administration via: Cup Medication Administration: Crushed with puree Supervision: Full supervision/cueing for compensatory strategies    Other  Recommendations Recommended Consults: Consider esophageal assessment Oral Care Recommendations: Oral care QID   Follow up Recommendations (tba)      Frequency and Duration            Prognosis Prognosis for Safe Diet Advancement: Good      Swallow Study   General HPI: Pt is an 85 y.o. female who was admitted 7/27 with AMS and falls, found to have severe hyponatremia with Na 109 requiring hypertonic saline. PMH significant for urinary incontinence, HTN , right neck mass (followed by serial CT scans, no biopsy), GERD and anemia. Per PCCM note, pt's friend shares that the pt has been having trouble swallowing, coughing and spitting up a lot of the food that she eats.  Type of Study: Bedside Swallow Evaluation Previous Swallow Assessment: none in chart Diet Prior to this Study: NPO Temperature Spikes Noted: No Respiratory Status: Room air History of Recent Intubation: No Behavior/Cognition: Alert;Cooperative;Requires cueing Oral Cavity Assessment: Other (comment)(black coating on tongue) Oral Care Completed by SLP: No Oral Cavity - Dentition: Dentures, top;Poor condition Vision: Functional for self-feeding Self-Feeding Abilities: Needs  assist Patient Positioning: Upright in bed Baseline Vocal Quality: Low vocal intensity Volitional Swallow: Able to elicit    Oral/Motor/Sensory Function Overall Oral Motor/Sensory Function: Within  functional limits   Ice Chips Ice chips: Within functional limits Presentation: Spoon   Thin Liquid Thin Liquid: Impaired Presentation: Cup;Self Fed;Straw Oral Phase Functional Implications: Oral holding Pharyngeal  Phase Impairments: Multiple swallows    Nectar Thick Nectar Thick Liquid: Not tested   Honey Thick Honey Thick Liquid: Not tested   Puree Puree: Impaired Presentation: Spoon Oral Phase Functional Implications: Oral holding Pharyngeal Phase Impairments: Multiple swallows   Solid     Solid: Not tested      Venita Sheffield Zandra Lajeunesse 05/18/2019,9:24 AM  Pollyann Glen, M.A. [place] Acute Environmental education officer [phone] Office [phone]

## 2019-05-19 ENCOUNTER — Inpatient Hospital Stay (HOSPITAL_COMMUNITY): Payer: Medicare Other

## 2019-05-19 LAB — COMPREHENSIVE METABOLIC PANEL
ALT: 16 U/L (ref 0–44)
AST: 14 U/L — ABNORMAL LOW (ref 15–41)
Albumin: 2.6 g/dL — ABNORMAL LOW (ref 3.5–5.0)
Alkaline Phosphatase: 92 U/L (ref 38–126)
Anion gap: 8 (ref 5–15)
BUN: 14 mg/dL (ref 8–23)
CO2: 19 mmol/L — ABNORMAL LOW (ref 22–32)
Calcium: 8 mg/dL — ABNORMAL LOW (ref 8.9–10.3)
Chloride: 96 mmol/L — ABNORMAL LOW (ref 98–111)
Creatinine, Ser: 0.59 mg/dL (ref 0.44–1.00)
GFR calc Af Amer: >60 mL/min (ref >60)
GFR calc non Af Amer: >60 mL/min (ref >60)
Glucose, Bld: 95 mg/dL (ref 70–99)
Potassium: 2.9 mmol/L — ABNORMAL LOW (ref 3.5–5.1)
Sodium: 123 mmol/L — ABNORMAL LOW (ref 135–145)
Total Bilirubin: 0.7 mg/dL (ref 0.3–1.2)
Total Protein: 5.4 g/dL — ABNORMAL LOW (ref 6.5–8.1)

## 2019-05-19 LAB — CBC
HCT: 25.2 % — ABNORMAL LOW (ref 36.0–46.0)
Hemoglobin: 9 g/dL — ABNORMAL LOW (ref 12.0–15.0)
MCH: 32.1 pg (ref 26.0–34.0)
MCHC: 35.7 g/dL (ref 30.0–36.0)
MCV: 90 fL (ref 80.0–100.0)
Platelets: 261 10*3/uL (ref 150–400)
RBC: 2.8 MIL/uL — ABNORMAL LOW (ref 3.87–5.11)
RDW: 12.3 % (ref 11.5–15.5)
WBC: 6.7 10*3/uL (ref 4.0–10.5)
nRBC: 0 % (ref 0.0–0.2)

## 2019-05-19 LAB — SODIUM
Sodium: 122 mmol/L — ABNORMAL LOW (ref 135–145)
Sodium: 123 mmol/L — ABNORMAL LOW (ref 135–145)
Sodium: 125 mmol/L — ABNORMAL LOW (ref 135–145)
Sodium: 125 mmol/L — ABNORMAL LOW (ref 135–145)
Sodium: 126 mmol/L — ABNORMAL LOW (ref 135–145)

## 2019-05-19 LAB — URINE CULTURE: Culture: 50000 — AB

## 2019-05-19 MED ORDER — POTASSIUM CHLORIDE CRYS ER 20 MEQ PO TBCR
40.0000 meq | EXTENDED_RELEASE_TABLET | ORAL | Status: AC
  Administered 2019-05-19 (×2): 40 meq via ORAL
  Filled 2019-05-19 (×2): qty 2

## 2019-05-19 MED ORDER — CHLORHEXIDINE GLUCONATE CLOTH 2 % EX PADS
6.0000 | MEDICATED_PAD | Freq: Every day | CUTANEOUS | Status: DC
  Administered 2019-05-19 – 2019-05-28 (×7): 6 via TOPICAL

## 2019-05-19 NOTE — Progress Notes (Signed)
NAME:  Barbara Dennis, MRN:  [id number], DOB:  01-Nov-1933, LOS: 2 ADMISSION DATE:  05/17/2019, CONSULTATION DATE:  05/17/19 REFERRING MD:  Tomi Bamberger, CHIEF COMPLAINT:  AMS, Falls    Brief History   Barbara Dennis is a 85 y.o. female who was admitted 7/27 with AMS and falls.  Found to have severe hyponatremia with Na 109 requiring hypertonic saline.  History of present illness   Pt is altered and [place] speaking; therefore, this HPI is obtained from chart review as well as interview with pt's friend / roommate using the [employer] interpretation software. Barbara Dennis is a 85 y.o. female who has a PMH including but not limited to HTN, right neck mass, urinary incontinence, GERD, anemia.  She presented to [facility] 7/27 after several falls over the past 1 - [redacted] weeks along with wosrening confusion / altered mental status.  In ED, labs revealed severe hyponatremia with Na 109.  Friend states she has had falls in the past that have seemed mechanical.  She also states she has had hyponatremia in the past and chart review shows that she required admission 12/17/18 through 12/19/18 for this which was believed to be hypovolemic in origin.  There has been some concern for underlying SIADH possibly from chronic right neck mass which has been followed with serial CT's as an outpatient (no biopsies as of yet).  [name] friend denies any recent fevers/chills/sweats, chest pain, cough, N/V/D, abd pain, myalgias.  Has had some right hip pain from falls.  She has not been walking for the past 1 week or so due to frequent falls; however, prior to this, she was able to walk independently with a walker.  She lives at home with friend and friends husband. Friend does state pt has had decreased PO intake due to dysphagia.  "She coughs and spits up a lot of the food that she eats (per translator)".  Friend states that due to this, pt has been hesitant to eat / drink much.  She has not been evaluated for this in the past.  Per Home Health  packet on 12/21/18 Past Medical History  HTN, right neck mass, urinary incontinence, GERD, anemia, COPD, nicotine dependence, cervical degeneration, age related osteoporosis w/o pathalogical fracture.   Significant Hospital Events   7/27 > admit.  Consults:  Ortho ENT  Procedures:  None.  Significant Diagnostic Tests:  R shoulder xray 7/27 > comminuted fx of right humerus. R hip xray 7/27 > negative. CT head / Cspine 7/27 > no acute process.  Advanced cervical spondylosis most severe at C5-C6 with severe spinal stenosis. CT pelvis 7/27 > negative. R & L Knee xray 7/28 > no acute abnormalities, age related changes   Micro Data:  05/17/19 Urine culture- 50,000 ecoli, FINAL   Antimicrobials:  None.   Interim history/subjective:       Objective   Temp:  [97.6 F (36.4 C)-98.5 F (36.9 C)] 97.6 F (36.4 C) (07/29 0740) Pulse Rate:  [72-95] 85 (07/29 0900) Cardiac Rhythm: Normal sinus rhythm;Heart block (07/29 0700) Resp:  [14-29] 14 (07/29 0900) BP: (95-161)/(53-87) 148/65 (07/29 0900) SpO2:  [92 %-100 %] 99 % (07/29 0900) Weight:  [42.7 kg] 42.7 kg (07/29 0500)   Intake/Output      07/28 0701 - 07/29 0700 07/29 0701 - 07/30 0700   I.V. (mL/kg) 985.5 (23.1)    IV Piggyback 96.9    Total Intake(mL/kg) 1082.4 (25.3)    Urine (mL/kg/hr) 850 (0.8)    Total Output  850    Net +232.4         Urine Occurrence 1 x     Filed Weights   05/17/19 1416 05/18/19 0444 05/19/19 0500  Weight: 45.4 kg 43.7 kg 42.7 kg   Examination: General: Elderly [place] female sitting up in bed, NAD.  HENT: NCAT Lungs: CTAB Cardiovascular: RRR.  Abdomen: Soft, NTND, + BS  Extremities: Warm. Right foot with ecchymoses on dorsal surface. Right arm in arm sling.  Neuro: 3/5 bilateral hand squeeze. A & O to herself only. Her American name is "[name]"   GU: deferred   Patient Lines/Drains/Airways Status   Active Line/Drains/Airways    Name:   Placement date:   Placement time:    Site:   Days:   Peripheral IV 05/17/19 Left Antecubital   05/17/19    1647    Antecubital   1   Peripheral IV 05/18/19 Left Forearm   05/18/19    0108    Forearm   less than 1   External Urinary Catheter   05/18/19    0001    -   less than 1   Pressure Injury   -    -              amLODipine, 2.5 mg, Oral, Daily Chlorhexidine Gluconate Cloth, 6 each, Topical, Q0600 gabapentin, 100 mg, Oral, BID heparin, 5,000 Units, Subcutaneous, Q8H lisinopril, 40 mg, Oral, Daily mouth rinse, 15 mL, Mouth Rinse, BID potassium chloride, 40 mEq, Oral, Q4H   Resolved Hospital Problem list        Assessment & Plan:  Active Problems:   Altered mental status   Severe hyponatremia, Acute on chronic.  Continue to improve with hypertonic saline and switch to normal saline yesterday.  Sodium this morning is 123.  Patient's vitals are stable and she does not have any acute mental changes since yesterday.  Baseline average sodium is around 130.  Random cortisol obtained yesterday and elevated mildly at 23.8.  --Continue NS -- Q4 hour Na check -- f/u UNa, Urosm pending -- holding home oxybutinyn   bacteruria  Asymptomatic.  50,000 CFU E. coli per micro. --No treatment at this time  Leg Pain  Patient is having leg pain that has been chronic.  She is a poor historian and it is unclear where her leg pain is.  She does have an ecchymotic area on her dorsal foot surface.  Bilateral knee x-rays were obtained yesterday and negative for any acute issues.  Started on gabapentin 100 mg yesterday for possible neuropathic pain in setting of severe vertebral pathology.  Would not want to start this patient on any opiates. --PRN Tylenol --Continue gabapentin, titrate up as needed  -- ABI to assess for claudication   Right neck mass  Currently undergoing intermittent surveillance. Per CT read:  Mild interval increase in size of right neck mass posterior to right submandibular gland and left deep parotid gland mass.   Called radiology yesterday who confirms that mass is stable in size.  Spoke to family, has never been biopsied, though family was wanting to obtain biopsy. PT is a current smoker making her high risk for cancer.  --Consult ENT for recommendations on further follow-up  Dysphagia, oral phase Known history of GERD per EGD - 2019  Possible from neck mass? Started on ommeprazole in December 2019 for muscoal changes found on EGD. Biopsies sent to path, but unable to see on chart.  SLP saw patient yesterday and recommended dysphagia  1 diet with advancement with increased endurance and strength.  There is impaired oral phase and patient should continue to be followed by speech at minimum 2 times a week.  They recommend 24-hour supervision and assistance at discharge.  They also recommend continued esophageal assessment.  Given patient's long history of GERD and inflammation on previous EGD, would consider further follow-up with barium swallow with GI to rule out achalasia or stricture. --Barium swallow --Continue home omeprazole  Hx HTN  -- continue home amlodipine and lisinopril   Osteoporosis T score -4.5 With acute Right humerus Fx  -- consulted ortho  -- f/u oupatient   Frequent Falls  Receives home health at this time.  PT and OT recommend that patient go to SNF. -- PT / OT   Known advanced cervical spondylosis with moderate to severe spnial stenosis  -- PT/OT -- o/p f/u    Best practice:  Diet: NPO Pain/Anxiety/Delirium protocol (if indicated): acetaminophen  DVT prophylaxis: heparin  GI prophylaxis: na Glucose control: na Mobility: OOB with help only  Code Status: Full Code  Family Communication:  Primary Emergency Contact: HWANG,SEONG, Home Phone: [phone]  Disposition: ICU until improvement in hyponatremia   Labs   CBC: Recent Labs  Lab 05/17/19 1655 05/18/19 0236 05/19/19 0358  WBC 10.5 9.4 6.7  NEUTROABS 8.5*  --   --   HGB 11.0* 9.7* 9.0*  HCT 30.2* 27.1* 25.2*   MCV 87.3 88.3 90.0  PLT 313 290 834    Basic Metabolic Panel: Recent Labs  Lab 05/17/19 1655  05/18/19 0236 05/18/19 1148 05/18/19 1627 05/18/19 2044 05/18/19 2337 05/19/19 0358  NA 109*   < > 112* 119* 121* 122* 122* 123*  K 4.1  --  3.4*  --   --   --   --  2.9*  CL 74*  --  83*  --   --   --   --  96*  CO2 22  --  20*  --   --   --   --  19*  GLUCOSE 123*  --  107*  --   --   --   --  95  BUN 17  --  14  --   --   --   --  14  CREATININE 0.73  --  0.61  --   --   --   --  0.59  CALCIUM 9.5  --  8.3*  --   --   --   --  8.0*  MG  --   --  1.8  --   --   --   --   --   PHOS  --   --  3.4  --   --   --   --   --    < > = values in this interval not displayed.   GFR: Estimated Creatinine Clearance: 35.9 mL/min (by C-G formula based on SCr of 0.59 mg/dL). Recent Labs  Lab 05/17/19 1655 05/18/19 0236 05/19/19 0358  WBC 10.5 9.4 6.7    Liver Function Tests: Recent Labs  Lab 05/17/19 1655 05/19/19 0358  AST 21 14*  ALT 19 16  ALKPHOS 119 92  BILITOT 1.0 0.7  PROT 7.4 5.4*  ALBUMIN 3.6 2.6*   CBG: Recent Labs  Lab 05/17/19 2307  GLUCAP 135*    Home Medications  12/21/18     Wilber Oliphant, M.D.  PGY-2  Family Medicine  05/19/2019 9:13 AM

## 2019-05-19 NOTE — Progress Notes (Signed)
[place] Progress Note Patient Name: Barbara Dennis DOB: [birth year] MRN: [id number]   Date of Service  05/19/2019  HPI/Events of Note  K+ 2.9  eICU Interventions  Elink electrolyte replacement protocol for hypokalemia ordered.        Kerry Kass Alecsander Hattabaugh 05/19/2019, 6:23 AM

## 2019-05-19 NOTE — Progress Notes (Signed)
Dr. Maudie Mercury requesting arm and ankle blood pressures. Obtained the following: -Left ankle 93/68.  -Left arm 130/65 -Right ankle 89/70 -Right arm unobtainable due to fracture and sling.  Myrle Sheng, BSN, RN 249M/49M MICU

## 2019-05-19 NOTE — Progress Notes (Signed)
  Speech Language Pathology Treatment: Dysphagia  Patient Details Name: Barbara Dennis MRN: [id number] DOB: 1934/04/04 Today's Date: 05/19/2019 Time: 9030-0923 SLP Time Calculation (min) (ACUTE ONLY): 14 min  Assessment / Plan / Recommendation Clinical Impression  Pt had just finished eating some of her lunch tray with RN, but was agreeable to additional trials. Her oral phase is still moderately prolonged with purees, but she requires only minimal rest breaks. SLP assisted with alternating bites/sips. Note that pt went for esophagram today with full report not yet available, but RN says no stricture per verbal report. As session continued, pt began to have coughing after intake and said she was starting to feel full. PO intake was therefore stopped. Recommend continuing Dys 1 diet (purees) and thin liquids with use of aspiration and esophageal precautions.     HPI HPI: Pt is an 85 y.o. female who was admitted 7/27 with AMS and falls, found to have severe hyponatremia with Na 109 requiring hypertonic saline. PMH significant for urinary incontinence, HTN , right neck mass (followed by serial CT scans, no biopsy), GERD and anemia. Per PCCM note, pt's friend shares that the pt has been having trouble swallowing, coughing and spitting up a lot of the food that she eats.       SLP Plan  Continue with current plan of care       Recommendations  Diet recommendations: Dysphagia 1 (puree);Thin liquid Liquids provided via: Straw;Cup Medication Administration: Whole meds with liquid(crush any larger pills) Supervision: Staff to assist with self feeding Compensations: Slow rate;Small sips/bites;Follow solids with liquid Postural Changes and/or Swallow Maneuvers: Seated upright 90 degrees;Upright 30-60 min after meal                Oral Care Recommendations: Oral care BID Follow up Recommendations: 24 hour supervision/assistance SLP Visit Diagnosis: Dysphagia, oral phase (R13.11) Plan: Continue  with current plan of care       GO                Venita Sheffield Allanna Bresee 05/19/2019, 2:33 PM  Pollyann Glen, M.A. [place] Acute Environmental education officer [phone] Office [phone]

## 2019-05-20 ENCOUNTER — Inpatient Hospital Stay (HOSPITAL_COMMUNITY): Payer: Medicare Other

## 2019-05-20 ENCOUNTER — Encounter (HOSPITAL_COMMUNITY): Payer: Self-pay | Admitting: Family Medicine

## 2019-05-20 DIAGNOSIS — K118 Other diseases of salivary glands: Secondary | ICD-10-CM

## 2019-05-20 DIAGNOSIS — R1310 Dysphagia, unspecified: Secondary | ICD-10-CM

## 2019-05-20 LAB — CBC
HCT: 27.6 % — ABNORMAL LOW (ref 36.0–46.0)
Hemoglobin: 9.8 g/dL — ABNORMAL LOW (ref 12.0–15.0)
MCH: 32.2 pg (ref 26.0–34.0)
MCHC: 35.5 g/dL (ref 30.0–36.0)
MCV: 90.8 fL (ref 80.0–100.0)
Platelets: 285 10*3/uL (ref 150–400)
RBC: 3.04 MIL/uL — ABNORMAL LOW (ref 3.87–5.11)
RDW: 12.6 % (ref 11.5–15.5)
WBC: 8 10*3/uL (ref 4.0–10.5)
nRBC: 0 % (ref 0.0–0.2)

## 2019-05-20 LAB — BASIC METABOLIC PANEL
Anion gap: 7 (ref 5–15)
BUN: 20 mg/dL (ref 8–23)
CO2: 22 mmol/L (ref 22–32)
Calcium: 8.8 mg/dL — ABNORMAL LOW (ref 8.9–10.3)
Chloride: 97 mmol/L — ABNORMAL LOW (ref 98–111)
Creatinine, Ser: 0.67 mg/dL (ref 0.44–1.00)
GFR calc Af Amer: >60 mL/min (ref >60)
GFR calc non Af Amer: >60 mL/min (ref >60)
Glucose, Bld: 119 mg/dL — ABNORMAL HIGH (ref 70–99)
Potassium: 4.5 mmol/L (ref 3.5–5.1)
Sodium: 126 mmol/L — ABNORMAL LOW (ref 135–145)

## 2019-05-20 LAB — SODIUM
Sodium: 126 mmol/L — ABNORMAL LOW (ref 135–145)
Sodium: 126 mmol/L — ABNORMAL LOW (ref 135–145)

## 2019-05-20 LAB — PHOSPHORUS: Phosphorus: 2 mg/dL — ABNORMAL LOW (ref 2.5–4.6)

## 2019-05-20 LAB — MAGNESIUM: Magnesium: 1.9 mg/dL (ref 1.7–2.4)

## 2019-05-20 MED ORDER — AMLODIPINE BESYLATE 5 MG PO TABS
5.0000 mg | ORAL_TABLET | Freq: Every day | ORAL | Status: DC
  Administered 2019-05-21: 5 mg via ORAL
  Filled 2019-05-20: qty 1

## 2019-05-20 MED ORDER — PANTOPRAZOLE SODIUM 40 MG PO TBEC
80.0000 mg | DELAYED_RELEASE_TABLET | Freq: Every day | ORAL | Status: DC
  Administered 2019-05-20 – 2019-05-29 (×10): 80 mg via ORAL
  Filled 2019-05-20 (×11): qty 2

## 2019-05-20 NOTE — Progress Notes (Signed)
Family Medicine Teaching Service Daily Progress Note Intern Pager: [phone]  Patient name: Barbara Dennis Medical record number: [id number] Date of birth: Jan 27, 1934 Age: 85 y.o. Gender: female  Primary Care Provider: Horald Pollen, MD Consultants: ENT, ortho Code Status: Full Code  Primary Emergency Contact: [place], Home Phone: [phone]   Pt Overview and Major Events to Date:  Hospital Day: 4 05/17/2019: admitted for Fall, Hip Pain, and Shoulder Pain   Brief Hospital Course  85 year old female who presented on 7/27 for several falls and worsening confusion and mental status for the last 1 to 2 weeks.  Patient was found to be hyponatremic to 109 and was admitted to the ICU for hypertonic saline.  During her admission, she has been complaining of chronic foot pain, which may be part of the reason that she was falling.  Additionally, patient presented with 1 to 2 weeks of dysphasia.  Per caretaker, patient had very good appetite prior to this.  She has a history of GERD and inflammation on last endoscopy in December 2019.  Per modified barium swallow, she has abnormal oral phase and per esophagram, she has a nonspecific esophageal motility disorder.  In the setting of her hyponatremia, it is important to understand the cause of her dysphagia and ensure that she is taking PO fluids and solids prior to discharge.  She is also continuing to work with physical therapy and the work-up for pain in her bilateral feet.  Assessment and Plan: Barbara Dennis is a 85 y.o. female who presented w/ hyponatremia to 109, frequent falls, and acute dysphagia.  PMHx s/f ACD, wedge compression fx T11-T12, hx, frequent falls, COPD, tobacco use, severe cervical degeneration and stenosis, age related osteoporosis with right humeral fracture, GERD. Uses walker, wheelchair and bedside commode via home health at home.   Severe hyponatremia, Acute on chronic.  Patient sodium 126 this morning.  Lisinopril  can also cause hyponatremia and this was discontinued and amlodipine was increased for hypertension.  We will continue normal saline resuscitation for goal sodium of 130.  It appears that urine lites were not collected.  Will attempt to obtain as it will help to further characterize the cause of her hyponatremia. --Continue NS -- q12 hour Na check -- f/u UNa, Urosm pending -- holding home oxybutinyn    Bacteruria  Asymptomatic.  50,000 CFU E. coli per micro. --No treatment at this time  Bilateral foot pain  Was able to talk to patient about her leg pain.  It is actually worse in her feet.  She has an ecchymotic lesion on her right dorsum that is nontender.  To palpation, she does not have any tenderness anywhere in her feet.  She explains that it is more of an achy pain.  We will have her work with PT to further identify possible mechanical causes of falls we did start her on gabapentin 100 mg 2 days ago for possible neuropathic pain in the setting of severe vertebral pathology.  Would not consider this patient a candidate for opiates for pain relief.  Differential for foot pain also includes peripheral artery disease as ABIs yesterday were 0.7, which indicates mild peripheral arterial occlusive disease.  Will counsel patient on quitting smoking, hypertension control and watch for signs of claudication.  Can consider starting aspirin and atorvastatin in this patient, though given age and frequent falls may be a contraindication for this.  Bilateral foot x-rays returned with no acute findings and significant for generalized osteopenia. --Continue gabapentin, titrate up  as needed   Right neck mass  Radiology confirms that right and left neck mass are consistent in size.  Spoke to Dr. Benjamine Mola  today about patient's outpatient order for CT-guided biopsy.  He agrees and would like it if the patient can get the study while inpatient.  -- Will contact IR for further scheduling.  Dysphagia, oral  phase Known history of GERD per EGD - 2019  Possible from neck mass? Speech is currently following.  Barium swallow was performed to rule out stricture or achalasia.  Results were consistent with a nonspecific esophageal motility disorder.  Will consult gastroenterology for repeat endoscopy given patient's difficulty and nonspecific results.  Can also consider that the masses in patient's neck can be limiting her swallowing ability. --Continue home omeprazole  Hx HTN  --Increased dose of amlodipine  Frequent Falls  Likely due to worsening hyponatremia and foot pain.   -- PT / OT   Osteoporosis T score -4.5 With acute Right humerus Fx  -- consulted ortho, no further intervention needed at this time.  Continue sling. -- f/u oupatient   Known advanced cervical spondylosis with moderate to severe spnial stenosis  -- PT/OT -- o/p f/u    #FEN/GI:  . Fluids: NS 85ml/hr  . Electrolytes: Replete as needed . Nutrition: Dysphagia I diet   Access: Purewic (2 days)  VTE prophylaxis: heparin  GI ppx: 40 ommeprazole   Disposition: Home SNF  pending improvement of Na and dysphagia.   Subjective:  NAEO.   Objective: Temp:  [97.6 F (36.4 C)-98.7 F (37.1 C)] 97.8 F (36.6 C) (07/30 0359) Pulse Rate:  [74-103] 74 (07/30 0359) Cardiac Rhythm: Normal sinus rhythm (07/30 0427) Resp:  [14-40] 18 (07/30 0359) BP: (103-154)/(59-78) 154/62 (07/30 0359) SpO2:  [90 %-100 %] 97 % (07/30 0359) Weight:  [43.1 kg] 43.1 kg (07/30 0359) 07/29 0701 - 07/30 0700 In: 1214.8 [P.O.:300; I.V.:914.8] Out: 1400 [Urine:1400]   Physical Exam: General: NAD, non-toxic, well-appearing, sitting comfortably in bed. Elderly korean woman.    HEENT: Bath/AT. PERRLA. EOMI.  Cardiovascular: RRR, normal S1, S2. B/L 2+ RP. N BLEE Respiratory: CTAB. No IWOB.  Abdomen: + BS. NT, ND, soft to palpation.  Extremities: Warm and well perfused. Moving spontaneously.  Integumentary: No obvious rashes, lesions, trauma on  general exam with exception of right dorsum eccymotic appearing lesion.  Neuro: A & O to self and place today (improved from previously).    Laboratory: I have personally read and reviewed all labs and imaging studies.  CBC: Recent Labs  Lab 05/17/19 1655 05/18/19 0236 05/19/19 0358 05/20/19 0519  WBC 10.5 9.4 6.7 8.0  NEUTROABS 8.5*  --   --   --   HGB 11.0* 9.7* 9.0* 9.8*  HCT 30.2* 27.1* 25.2* 27.6*  MCV 87.3 88.3 90.0 90.8  PLT 313 290 261 285   CMP: Recent Labs  Lab 05/17/19 1655  05/18/19 0236  05/19/19 0358 05/19/19 1152 05/19/19 1725 05/19/19 1914 05/19/19 2321 05/20/19 0519  NA 109*   < > 112*   < > 123* 123* 125* 126* 125* 126*  K 4.1  --  3.4*  --  2.9*  --   --   --   --  4.5  CL 74*  --  83*  --  96*  --   --   --   --  97*  CO2 22  --  20*  --  19*  --   --   --   --  22  GLUCOSE 123*  --  107*  --  95  --   --   --   --  119*  BUN 17  --  14  --  14  --   --   --   --  20  CREATININE 0.73  --  0.61  --  0.59  --   --   --   --  0.67  CALCIUM 9.5  --  8.3*  --  8.0*  --   --   --   --  8.8*  MG  --   --  1.8  --   --   --   --   --   --  1.9  PHOS  --   --  3.4  --   --   --   --   --   --  2.0*  ALBUMIN 3.6  --   --   --  2.6*  --   --   --   --   --    < > = values in this interval not displayed.   GFR: Estimated Creatinine Clearance: 36.3 mL/min (by C-G formula based on SCr of 0.67 mg/dL).  Liver Function Tests: Recent Labs  Lab 05/17/19 1655 05/19/19 0358  AST 21 14*  ALT 19 16  ALKPHOS 119 92  BILITOT 1.0 0.7  PROT 7.4 5.4*  ALBUMIN 3.6 2.6*   CBG: Recent Labs  Lab 05/17/19 2307  GLUCAP 135*    Urine analysis: Recent Labs    05/18/19 0221  COLORURINE YELLOW  APPEARANCEUR CLOUDY*  LABSPEC 1.006  PHURINE 7.0  GLUCOSEU NEGATIVE  HGBUR SMALL*  BILIRUBINUR NEGATIVE  KETONESUR NEGATIVE  PROTEINUR 30*  NITRITE NEGATIVE  LEUKOCYTESUR NEGATIVE     Micro:  05/17/19 Urine culture- 50,000 ecoli, FINAL   Imaging/Diagnostic  Tests: R shoulder xray 7/27 > comminuted fx of right humerus. R hip xray 7/27 > negative. CT head / Cspine 7/27 >no acute process. Advanced cervical spondylosis most severe at C5-C6 with severe spinal stenosis. CT pelvis 7/27 > negative. R & L Knee xray 7/28 > no acute abnormalities, age related changes     EKG Interpretation  Date/Time:  Monday May 17 2019 16:33:47 EDT Ventricular Rate:  87 PR Interval:    QRS Duration: 103 QT Interval:  410 QTC Calculation: 494 R Axis:   -83 Text Interpretation:  Sinus rhythm RSR' in V1 or V2, probably normal variant Left ventricular hypertrophy Inferior infarct, old Artifact No significant change since last tracing Confirmed by Dorie Rank [phone]) on 05/17/2019 4:37:53 PM       Procedures:  Esophogram: unspecified esophageal motility disorder    Wilber Oliphant, MD 05/20/2019, 7:09 AM PGY-1, [place] Intern pager: [phone], text pages welcome

## 2019-05-20 NOTE — Progress Notes (Signed)
Second attempt for report to 5W RN.

## 2019-05-20 NOTE — Progress Notes (Signed)
Physical Therapy Treatment Patient Details Name: Barbara Dennis MRN: [id number] DOB: 01/18/1934 Today's Date: 05/20/2019    History of Present Illness Pt adm with hyponatremia and frequent falls. Found to have rt shoulder fx. PMH - htn, copd, hyponatremia, rt rib fx's, T12 fx, frequent falls    PT Comments    Pt continues to be limited with mobility. Pt continues to c/o foot pain regardless of position. Sat EOB but pt refused any attempt to stand or transfer to chair. Question if pt has some dementia which is playing a part in her decline in mobility.    Follow Up Recommendations  SNF;Supervision/Assistance - 24 hour     Equipment Recommendations  Other (comment)(To be determined)    Recommendations for Other Services       Precautions / Restrictions Restrictions Weight Bearing Restrictions: Yes RUE Weight Bearing: Non weight bearing    Mobility  Bed Mobility Overal bed mobility: Needs Assistance Bed Mobility: Supine to Sit;Sit to Supine     Supine to sit: Total assist Sit to supine: Max assist   General bed mobility comments: Assist to move legs off of bed, elevate trunk into sitting and bring hips to EOB. Pt grimacing when elevating trunk and does state her back hurts.   Transfers                 General transfer comment: Pt refused any attempt to stand or transfer to chair.  Ambulation/Gait                 Stairs             Wheelchair Mobility    Modified Rankin (Stroke Patients Only)       Balance Overall balance assessment: Needs assistance Sitting-balance support: Single extremity supported;Feet unsupported;Feet supported Sitting balance-Leahy Scale: Fair Sitting balance - Comments: Pt sat EOB x 10 minutes with min guard assist                                    Cognition Arousal/Alertness: Awake/alert Behavior During Therapy: WFL for tasks assessed/performed Overall Cognitive Status: No family/caregiver present  to determine baseline cognitive functioning                                        Exercises      General Comments        Pertinent Vitals/Pain Pain Assessment: Faces Faces Pain Scale: Hurts even more Pain Location: feet and back Pain Descriptors / Indicators: Grimacing;Guarding Pain Intervention(s): Limited activity within patient's tolerance;Monitored during session;Repositioned    Home Living                      Prior Function            PT Goals (current goals can now be found in the care plan section) Acute Rehab PT Goals Patient Stated Goal: Pt didn't state Progress towards PT goals: Progressing toward goals    Frequency    Min 2X/week      PT Plan Current plan remains appropriate;Frequency needs to be updated    Co-evaluation              AM-PAC PT "6 Clicks" Mobility   Outcome Measure  Help needed turning from your back to your side while in a flat  bed without using bedrails?: Total Help needed moving from lying on your back to sitting on the side of a flat bed without using bedrails?: Total Help needed moving to and from a bed to a chair (including a wheelchair)?: Total Help needed standing up from a chair using your arms (e.g., wheelchair or bedside chair)?: Total Help needed to walk in hospital room?: Total Help needed climbing 3-5 steps with a railing? : Total 6 Click Score: 6    End of Session   Activity Tolerance: Patient limited by pain Patient left: in bed;with call bell/phone within reach;with bed alarm set   PT Visit Diagnosis: Other abnormalities of gait and mobility (R26.89);Pain Pain - Right/Left: Right(bilateral) Pain - part of body: Shoulder;Ankle and joints of foot     Time: 1140-1156 PT Time Calculation (min) (ACUTE ONLY): 16 min  Charges:  $Therapeutic Activity: 8-22 mins                     [place] Pager [phone] Office [place] 05/20/2019, 3:45 PM

## 2019-05-20 NOTE — Progress Notes (Signed)
NURSING PROGRESS NOTE  Barbara Dennis [id number] Transfer Data: 05/20/2019 4:31 AM Attending Provider: Collene Gobble, MD PPH:[id number], Ines Bloomer, MD Code Status: FULL   Barbara Dennis is a 85 y.o. female patient transferred from 70M  -No acute distress noted.  -No complaints of shortness of breath.  -No complaints of chest pain.   Cardiac Monitoring: Box # 20 in place. Cardiac monitor yields:normal sinus rhythm.  Blood pressure (!) 154/62, pulse 74, temperature 97.8 F (36.6 C), resp. rate 18, height 5' (1.524 m), weight 43.1 kg, SpO2 97 %.   IV Fluids:  IV in place, occlusive dsg intact without redness, IV cath forearm left, condition no redness normal saline.   Allergies:  Patient has no known allergies.  Past Medical History:   has a past medical history of Anemia of chronic disease, GERD (gastroesophageal reflux disease), Hypertension, Mass of right side of neck, and Urinary incontinence.  Past Surgical History:   has a past surgical history that includes Appendectomy; Esophagogastroduodenoscopy (egd) with propofol (N/A, 08/24/2018); and biopsy (08/24/2018).  Social History:   reports that she has been smoking cigarettes. She has a 42.00 pack-year smoking history. She has never used smokeless tobacco. She reports that she does not drink alcohol or use drugs.  Skin: Documented in chart  Patient orientated to room. Information packet given to patient. Admission inpatient armband information verified with patient/family to include name and date of birth and placed on patient arm. Side rails up x 2, fall assessment and education completed with patient/family. Patient/family able to verbalize understanding of risk associated with falls and verbalized understanding to call for assistance before getting out of bed. Call light within reach. Patient/family able to voice and demonstrate understanding of unit orientation instructions.    Will continue to evaluate and treat per MD  orders.

## 2019-05-20 NOTE — Progress Notes (Signed)
Attempted to call report to RN on 5W. Will attempt again later.

## 2019-05-20 NOTE — Evaluation (Signed)
Occupational Therapy Evaluation Patient Details Name: Barbara Dennis MRN: [id number] DOB: 1934/06/17 Today's Date: 05/20/2019    History of Present Illness Pt admitted with hyponatremia and frequent falls. Found to have rt shoulder fx. PMH - htn, copd, hyponatremia, rt rib fx's, T12 fx, frequent falls   Clinical Impression   Pt reports she walked with a walker and could perform her own ADL prior to admission. Pt is a questionable historian and use of [place] interpreter, Jihee via video, was not helpful. Pt present with impaired cognition and reports pain in her feet, refusing attempt to stand. She requires min to max assist for ADL. Recommending SNF for further rehab. Will follow acutely.    Follow Up Recommendations  SNF;Supervision/Assistance - 24 hour    Equipment Recommendations       Recommendations for Other Services       Precautions / Restrictions Precautions Precautions: Fall Precaution Comments: sling for comfort, may use R UE for ADL Required Braces or Orthoses: Sling Restrictions Weight Bearing Restrictions: Yes RUE Weight Bearing: Non weight bearing      Mobility Bed Mobility Overal bed mobility: Needs Assistance Bed Mobility: Supine to Sit;Sit to Supine     Supine to sit: Total assist Sit to supine: Max assist   General bed mobility comments: Assist to move legs off of bed, elevate trunk into sitting and bring hips to EOB. Pt grimacing when elevating trunk and does state her back hurts.   Transfers                 General transfer comment: Pt refused any attempt to stand or transfer to chair.    Balance Overall balance assessment: Needs assistance Sitting-balance support: Single extremity supported;Feet unsupported;Feet supported Sitting balance-Leahy Scale: Fair Sitting balance - Comments: Pt sat EOB x 5 minutes with min guard assist                                   ADL either performed or assessed with clinical judgement    ADL Overall ADL's : Needs assistance/impaired Eating/Feeding: Minimal assistance;Bed level   Grooming: Sitting;Moderate assistance;Wash/dry hands;Wash/dry face;Brushing hair   Upper Body Bathing: Moderate assistance;Sitting   Lower Body Bathing: Maximal assistance;Sitting/lateral leans   Upper Body Dressing : Moderate assistance;Sitting   Lower Body Dressing: Maximal assistance;Sit to/from stand                 General ADL Comments: pt declining attempt to stand     Vision Patient Visual Report: No change from baseline       Perception     Praxis      Pertinent Vitals/Pain Pain Assessment: Faces Faces Pain Scale: Hurts even more Pain Location: feet and back Pain Descriptors / Indicators: Grimacing;Guarding Pain Intervention(s): Limited activity within patient's tolerance;Monitored during session;Repositioned     Hand Dominance Right   Extremity/Trunk Assessment Upper Extremity Assessment Upper Extremity Assessment: RUE deficits/detail RUE Deficits / Details: full AROM elbow to hand RUE Coordination: decreased gross motor   Lower Extremity Assessment Lower Extremity Assessment: Defer to PT evaluation   Cervical / Trunk Assessment Cervical / Trunk Assessment: Other exceptions Cervical / Trunk Exceptions: appears painful with bed mobility    Communication Communication Communication: Prefers language other than English(korean)   Cognition Arousal/Alertness: Awake/alert Behavior During Therapy: WFL for tasks assessed/performed Overall Cognitive Status: No family/caregiver present to determine baseline cognitive functioning  General Comments: pt not able to benefit from use of interpreter, per interpreter she was not responding appropriately.   General Comments       Exercises     Shoulder Instructions      Home Living Family/patient expects to be discharged to:: Private residence Living Arrangements:  Non-relatives/Friends Available Help at Discharge: Friend(s);Available PRN/intermittently                         Home Equipment: Walker - 2 wheels;Wheelchair - manual   Additional Comments: Pt poor historian. Some information from prior visit or chart      Prior Functioning/Environment Level of Independence: Needs assistance  Gait / Transfers Assistance Needed: Per chart was amb with walker. Pt with frequent falls.  ADL's / Homemaking Assistance Needed: pt reports she was able to feed, bathe and dress herself            OT Problem List: Decreased strength;Decreased activity tolerance;Impaired balance (sitting and/or standing);Decreased coordination;Decreased cognition;Decreased safety awareness;Decreased knowledge of use of DME or AE;Pain;Impaired UE functional use      OT Treatment/Interventions: Self-care/ADL training;DME and/or AE instruction;Cognitive remediation/compensation;Patient/family education;Balance training    OT Goals(Current goals can be found in the care plan section) Acute Rehab OT Goals Patient Stated Goal: to go home OT Goal Formulation: Patient unable to participate in goal setting Time For Goal Achievement: 06/03/19 Potential to Achieve Goals: Fair ADL Goals Pt Will Perform Eating: with modified independence;sitting Pt Will Perform Grooming: with min assist;sitting Pt Will Perform Upper Body Bathing: with min assist;sitting Pt Will Perform Upper Body Dressing: with min assist;sitting Pt Will Transfer to Toilet: with min assist;squat pivot transfer;bedside commode Pt Will Perform Toileting - Clothing Manipulation and hygiene: with min assist;sit to/from stand Pt/caregiver will Perform Home Exercise Program: Increased ROM;Right Upper extremity;With minimal assist(elbow to hand AROM)  OT Frequency: Min 2X/week   Barriers to D/C:            Co-evaluation              AM-PAC OT "6 Clicks" Daily Activity     Outcome Measure Help from  another person eating meals?: A Little Help from another person taking care of personal grooming?: A Lot Help from another person toileting, which includes using toliet, bedpan, or urinal?: Total Help from another person bathing (including washing, rinsing, drying)?: A Lot Help from another person to put on and taking off regular upper body clothing?: A Lot Help from another person to put on and taking off regular lower body clothing?: Total 6 Click Score: 11   End of Session Nurse Communication: Weight bearing status;Other (comment)(may use R UE to eat)  Activity Tolerance: Patient limited by pain Patient left: in bed;with call bell/phone within reach;with bed alarm set  OT Visit Diagnosis: Unsteadiness on feet (R26.81);Other abnormalities of gait and mobility (R26.89);Pain;History of falling (Z91.81);Muscle weakness (generalized) (M62.81);Other symptoms and signs involving cognitive function                Time: 5638-7564 OT Time Calculation (min): 19 min Charges:  OT General Charges $OT Visit: 1 Visit OT Evaluation $OT Eval Moderate Complexity: 1 Mod  Nestor Lewandowsky, OTR/L Acute Rehabilitation Services Pager: [phone] Office: [phone] [place] So 05/20/2019, 4:55 PM

## 2019-05-20 NOTE — Progress Notes (Addendum)
FAMILY MEDICINE TEACHING SERVICE Patient - Please contact intern pager [phone] or check [facility] residency on via website [email or web address] (login: [email or web address]) for questions regarding care. Text pages welcome. DO NOT page listed attending provider unless there is no answer from the number above.  

## 2019-05-20 NOTE — Progress Notes (Signed)
Report given to 5W RN. Pt transferred to 5W05 in low bed. No pt belongings, pt stable.

## 2019-05-20 NOTE — Progress Notes (Addendum)
Spoke to Dr. Laurence Ferrari with interventional radiology and unfortunately, there is no space in the schedule to perform biopsy until next week.  Patient will likely have to get her biopsy done outpatient, unless she stays in the hospital, which I do not believe will be the case.  Went to see patient again this afternoon and was able to do further examination of her feet.  She has exquisite tenderness with dorsiflexion of her right foot and tenderness to palpation of the ecchymosis on the dorsum of her foot.  It is also warm to touch.  She reports that she has tenderness and pain in other areas however, she does not wince or appear to be in pain.  She does appear to be obviously in pain when dorsiflexing her right foot.  Patient is a poor historian and she denies anything falling on her foot recently.  Will confirm with family when I update them.  Foot x-ray returned with no abnormalities.  Patient is also taking meals, 40% of breakfast and 70% of lunch.  We will get further recommendations from speech pathology tomorrow about disposition plan for this patient.  She will likely be able to discharge tomorrow or the next day given her sodium remained stable.  Please page [phone] with any questions or issues   Wilber Oliphant, M.D.  PGY-2  Family Medicine  [phone] 05/20/2019 4:49 PM

## 2019-05-21 ENCOUNTER — Inpatient Hospital Stay (HOSPITAL_COMMUNITY): Payer: Medicare Other

## 2019-05-21 DIAGNOSIS — R410 Disorientation, unspecified: Secondary | ICD-10-CM

## 2019-05-21 DIAGNOSIS — R221 Localized swelling, mass and lump, neck: Secondary | ICD-10-CM

## 2019-05-21 LAB — BASIC METABOLIC PANEL
Anion gap: 8 (ref 5–15)
BUN: 12 mg/dL (ref 8–23)
CO2: 25 mmol/L (ref 22–32)
Calcium: 9.1 mg/dL (ref 8.9–10.3)
Chloride: 94 mmol/L — ABNORMAL LOW (ref 98–111)
Creatinine, Ser: 0.65 mg/dL (ref 0.44–1.00)
GFR calc Af Amer: >60 mL/min (ref >60)
GFR calc non Af Amer: >60 mL/min (ref >60)
Glucose, Bld: 122 mg/dL — ABNORMAL HIGH (ref 70–99)
Potassium: 4 mmol/L (ref 3.5–5.1)
Sodium: 127 mmol/L — ABNORMAL LOW (ref 135–145)

## 2019-05-21 MED ORDER — ADULT MULTIVITAMIN W/MINERALS CH
1.0000 | ORAL_TABLET | Freq: Every day | ORAL | Status: DC
  Administered 2019-05-21 – 2019-05-29 (×9): 1 via ORAL
  Filled 2019-05-21 (×10): qty 1

## 2019-05-21 MED ORDER — GABAPENTIN 100 MG PO CAPS: 100.0000 mg | ORAL_CAPSULE | Freq: Two times a day (BID) | ORAL | Status: DC

## 2019-05-21 MED ORDER — LIDOCAINE 5 % EX PTCH
1.0000 | MEDICATED_PATCH | Freq: Every day | CUTANEOUS | Status: DC
  Administered 2019-05-21 – 2019-05-28 (×9): 1 via TRANSDERMAL
  Filled 2019-05-21 (×9): qty 1

## 2019-05-21 MED ORDER — NAPROXEN 250 MG PO TABS
500.0000 mg | ORAL_TABLET | Freq: Two times a day (BID) | ORAL | Status: DC | PRN
  Administered 2019-05-21: 15:00:00 500 mg via ORAL
  Filled 2019-05-21: qty 2

## 2019-05-21 MED ORDER — BACLOFEN 10 MG PO TABS
5.0000 mg | ORAL_TABLET | Freq: Two times a day (BID) | ORAL | Status: DC
  Administered 2019-05-22 (×2): 5 mg via ORAL
  Filled 2019-05-21 (×2): qty 1

## 2019-05-21 MED ORDER — RAMELTEON 8 MG PO TABS
8.0000 mg | ORAL_TABLET | Freq: Every day | ORAL | Status: DC
  Administered 2019-05-21 – 2019-05-28 (×8): 8 mg via ORAL
  Filled 2019-05-21 (×9): qty 1

## 2019-05-21 MED ORDER — BACLOFEN 10 MG PO TABS
5.0000 mg | ORAL_TABLET | Freq: Once | ORAL | Status: AC
  Administered 2019-05-21: 5 mg via ORAL
  Filled 2019-05-21: qty 1

## 2019-05-21 MED ORDER — AMLODIPINE BESYLATE 10 MG PO TABS
10.0000 mg | ORAL_TABLET | Freq: Every day | ORAL | Status: DC
  Administered 2019-05-22 – 2019-05-28 (×7): 10 mg via ORAL
  Filled 2019-05-21 (×7): qty 1

## 2019-05-21 NOTE — Progress Notes (Signed)
Pt continues to complain of pain in left leg and foot. PRN tylenol given. MD paged and made aware.  Pt requested that interpreter iPad be removed from room. Educated pt that it assists [place] in communicating with her. Pt may benefit from family at bedside. MD agrees. Spoke with pts granddaughter, Darrick Penna, and updated her. Family thinks that she would benefit from having her daughter, Dorthula Perfect, at bedside. Dorthula Perfect will also need translation services, but pt is most familiar with her. CN made aware. Will hand off to day shift RN.

## 2019-05-21 NOTE — Progress Notes (Signed)
  Speech Language Pathology Treatment: Dysphagia  Patient Details Name: Barbara Dennis MRN: [id number] DOB: Sep 27, 1934 Today's Date: 05/21/2019 Time: 1355-1410 SLP Time Calculation (min) (ACUTE ONLY): 15 min  Assessment / Plan / Recommendation Clinical Impression  Patient seen to address dysphagia goals with PO's of gelatin and thin liquids. She requested help with feeding with gelatin but was able to hold cup and self-administer sips of thin liquids without assistance. No overt s/s of aspiration or penetration with PO's. She did exhibit prolonged mastication with gelatin, but no residuals in oral cavity post swallows. Of note, patient declined video interpreter, but was able to understand and respond in [place] well enough for this session. She asked SLP, "Will you come back?" and SLP indicated that other therapist will return to check on her sometime soon.    HPI HPI: Pt is an 85 y.o. female who was admitted 7/27 with AMS and falls, found to have severe hyponatremia with Na 109 requiring hypertonic saline. PMH significant for urinary incontinence, HTN , right neck mass (followed by serial CT scans, no biopsy), GERD and anemia. Per PCCM note, pt's friend shares that the pt has been having trouble swallowing, coughing and spitting up a lot of the food that she eats.       SLP Plan  Continue with current plan of care       Recommendations  Diet recommendations: Dysphagia 1 (puree);Thin liquid Liquids provided via: Cup;Straw Medication Administration: Whole meds with liquid Supervision: Staff to assist with self feeding;Full supervision/cueing for compensatory strategies Compensations: Slow rate;Small sips/bites;Follow solids with liquid Postural Changes and/or Swallow Maneuvers: Seated upright 90 degrees;Upright 30-60 min after meal                Oral Care Recommendations: Oral care BID Follow up Recommendations: 24 hour supervision/assistance SLP Visit Diagnosis: Dysphagia, oral  phase (R13.11) Plan: Continue with current plan of care       GO                [name] 05/21/2019, [place], MA, [facility] Pager: [phone]

## 2019-05-21 NOTE — Progress Notes (Signed)
Found pt with IV removed. Contacted interpreter services. Pt stated that she removed IV because it is sore and she can't sleep. Pt oriented to self. Pt stated that she does not need a new one. Attempted to educated pt that she needs the IV so that she can receive fluids to treat her low sodium levels. Pt agitated and continues to refuse IV. Attempted to call pts family to assist in reorienting pt, but there was no answer. Replaced IV in RFA. Wrapped IV access with gauze. Paged MD and made aware.   MD with pt at bedside. Will remove IV access per MD orders. Lidocaine patch and K pads applied. Will continue to monitor and assess per MD orders.

## 2019-05-21 NOTE — Progress Notes (Signed)
Spoke with pt family and updated them.

## 2019-05-21 NOTE — Care Management Important Message (Signed)
Important Message  Patient Details  Name: Barbara Dennis MRN: [id number] Date of Birth: Dec 11, 1933   Medicare Important Message Given:  Yes     Bryden Darden 05/21/2019, 3:14 PM

## 2019-05-21 NOTE — Progress Notes (Signed)
Initial Nutrition Assessment  DOCUMENTATION CODES:   Underweight  INTERVENTION:   -Magic Cup TID with meals, each supplement provides 290 kcals and 9 grams protein -MVI with minerals daily  NUTRITION DIAGNOSIS:   Increased nutrient needs related to acute illness as evidenced by estimated needs.  GOAL:   Patient will meet greater than or equal to 90% of their needs  MONITOR:   Supplement acceptance, PO intake, Diet advancement, Labs, Weight trends, Skin, I & O's  REASON FOR ASSESSMENT:   Other (Comment)    ASSESSMENT:   85 yr old F w/ PMHx sig for urinary incontinence, HTN , right neck mass, GERD and anemia. Presents s/p falls at home Na + 109 on Labs PCCM consulted for symptomatic hyponatremia  Pt admitted with symptomatic hyponatremia and chronic rt neck mass.   7/28- s/p MBSS- advanced to dysphagia 1 diet with thin liquids  Reviewed I/O's: +57 ml x 24 hours and +447 ml since admission  UOP: 1.6 L x 24 hours  Pt sitting up in bed eating lunch at time of visit; she had consuming approximately half of her meal per RD observation. Noted documented meal completion 40-70%.   Pt reports feeling well today and good appetite. She denies any difficulty tolerating current diet texture. She refused nutrition-focused physical exam.   Labs reviewed: Na: 126, Phos: 2.0.   NUTRITION - FOCUSED PHYSICAL EXAM:    Most Recent Value  Orbital Region  No depletion  Upper Arm Region  Unable to assess  Thoracic and Lumbar Region  Unable to assess  Buccal Region  No depletion  Temple Region  No depletion  Clavicle Bone Region  Moderate depletion  Clavicle and Acromion Bone Region  Unable to assess  Scapular Bone Region  Unable to assess  Dorsal Hand  Unable to assess  Patellar Region  Unable to assess  Anterior Thigh Region  Unable to assess  Posterior Calf Region  Unable to assess  Edema (RD Assessment)  Unable to assess  Hair  Unable to assess  Eyes  Unable to assess  Mouth   Unable to assess  Skin  Unable to assess  Nails  Unable to assess       Diet Order:   Diet Order            DIET - DYS 1 Room service appropriate? Yes; Fluid consistency: Thin  Diet effective now              EDUCATION NEEDS:   No education needs have been identified at this time  Skin:  Skin Assessment: Reviewed RN Assessment  Last BM:  Unknown  Height:   Ht Readings from Last 1 Encounters:  05/17/19 5' (1.524 m)    Weight:   Wt Readings from Last 1 Encounters:  05/21/19 41.3 kg    Ideal Body Weight:  45.5 kg  BMI:  Body mass index is 17.77 kg/m.  Estimated Nutritional Needs:   Kcal:  1200-1400  Protein:  50-65 grams  Fluid:  > 1.2 L    Hassell Patras A. Jimmye Norman, RD, LDN, [place] Registered Dietitian II Certified Diabetes Care and Education Specialist Pager: [phone] After hours Pager: [phone]

## 2019-05-21 NOTE — Progress Notes (Addendum)
Family Medicine Teaching Service Daily Progress Note Intern Pager: [phone]  Patient name: Barbara Dennis Medical record number: [id number] Date of birth: 12-Sep-1934 Age: 85 y.o. Gender: female  Primary Care Provider: Horald Pollen, MD Consultants: ENT, ortho Code Status: Full Code  Primary Emergency Contact: Barbara Dennis, Home Phone: [phone]   Pt Overview and Major Events to Date:  Hospital Day: 5 05/17/2019: admitted for Fall, Hip Pain, and Shoulder Pain   Brief Hospital Course  85 year old female who presented on 7/27 for several falls and worsening confusion and mental status for the last 1 to 2 weeks.  Patient was found to be hyponatremic to 109 and was admitted to the ICU for hypertonic saline.  During her admission, she has been complaining of chronic foot pain, which may be part of the reason that she was falling.  Additionally, patient presented with 1 to 2 weeks of dysphasia.  Per caretaker, patient had very good appetite prior to this.  She has a history of GERD and inflammation on last endoscopy in December 2019.  Per modified barium swallow, she has abnormal oral phase and per esophagram, she has a nonspecific esophageal motility disorder.  In the setting of her hyponatremia, it is important to understand the cause of her dysphagia and ensure that she is taking PO fluids and solids prior to discharge.  She is also continuing to work with physical therapy and the work-up for pain in her bilateral feet.  Assessment and Plan: Barbara Dennis is a 84 y.o. female who presented w/ hyponatremia to 109, frequent falls, and acute dysphagia.  PMHx s/f ACD, wedge compression fx T11-T12, hx, frequent falls, COPD, tobacco use, severe cervical degeneration and stenosis, age related osteoporosis with right humeral fracture, GERD. Uses walker, wheelchair and bedside commode via home health at home.   Right foot pain  Patient has complained of leg pain since arrival.  I believe that  patient has chronic pain throughout her body but she has an area of isolated pain that is consistently painful and upsetting to patient when palpated.  She has a dorsal ecchymotic lesion and pain with dorsiflexion.  She reports having pain in other places of her body though she does not have the same reaction as she does with the right foot.  Patient has been working with PT and OT who recommended SNF or 24-hour supervision.  Will speak to family today to discuss disposition.  Patient should continue physical therapy -- add on naproxen BID  -- continue tylenol  -- MRI ankle/foot  Confusion  Acute onset a couple of weeks ago per caretakers. Head imaging on admission was negative. Caretaker this morning reported that she was still altered and not at baseline. It seemed to have been an acute change.  -- MRI head -- continue to monitor -- ramelteon QHS   Dysphagia, oral phase Known history of GERD per EGD - 2019  Possible from neck mass? Patient's esophagram consistent with a nonspecific esophageal motility disorder.  However, she continues to take meals.  As she is not acutely unable to eat, it would be okay for her to discharge and follow-up with GI outpatient for further work-up.  --Continue home omeprazole  Hx HTN  Blood pressures have remained elevated, last being 169/73.  Her lisinopril was discontinued yesterday as it is known to cause hyponatremia. -- increase amlodipine to 10mg  BID   Frequent Falls  Likely due to right foot pain and generalized weakness given age.  Patient will need to continue  PT OT for strength. -- PT / OT   Severe hyponatremia, Acute on chronic, resolved Sodium this morning is 127.  Overnight, patient had pulled out her IV and a bout of confusion and expressed that she wanted to go home.  It is reassuring that her sodium has stayed elevated.  Meals were 40%/70% / 70% completed yesterday, indicating improved p.o. intake.  Patient does remain confused, however,  this may be patient's new baseline or possibly her baseline prior to admission.  She appears much more alert and awake than on admission and her voice is also stronger.  From a hyponatremia point of view, I believe that patient is stable for discharge and will need to continue close follow-up with her PCP for further management. --Continue p.o. intake  Right neck mass  Unfortunately, unable to obtain biopsy while patient is inpatient due to scheduling.  She will need to have this scheduled as outpatient.  We will try to schedule for her prior to discharge. -- Will contact IR for further scheduling -- o/p f/u  Osteoporosis T score -4.5 With acute Right humerus Fx  -- consulted ortho, no further intervention needed at this time.  Continue sling. -- f/u oupatient   Known advanced cervical spondylosis with moderate to severe spnial stenosis  -- PT/OT -- o/p f/u    Bacteruria  Asymptomatic.  50,000 CFU E. coli per micro. --No treatment at this time  #FEN/GI:  . Fluids: None . Electrolytes: Replete as needed . Nutrition: Dysphagia I diet   Access: none VTE prophylaxis: heparin  GI ppx: 40 ommeprazole   Disposition: Home SNF  pending improvement of Na and dysphagia.   Subjective:  Patient had episode of acute pain overnight and distress as she wanted to go home.  See notes.   Objective: Temp:  [97.5 F (36.4 C)-98.1 F (36.7 C)] 97.5 F (36.4 C) (07/31 0546) Pulse Rate:  [77-86] 85 (07/31 0546) Cardiac Rhythm: Normal sinus rhythm (07/31 0700) Resp:  [18] 18 (07/30 1300) BP: (146-169)/(68-73) 169/73 (07/31 0546) SpO2:  [98 %-100 %] 100 % (07/31 0546) Weight:  [41.3 kg] 41.3 kg (07/31 0546) 07/30 0701 - 07/31 0700 In: 1656.8 [P.O.:554; I.V.:1102.8] Out: 1600 [Urine:1600]   Physical Exam: General: NAD, non-toxic, well-appearing, sitting comfortably in bed. Elderly [place] woman.    HEENT: /AT. PERRLA. EOMI.  Cardiovascular: RRR, normal S1, S2. B/L 2+ RP. N  BLEE Respiratory: CTAB. No IWOB.  Abdomen: + BS. NT, ND, soft to palpation.  Extremities: Warm and well perfused. Moving spontaneously.  She has good active range of motion in her lower extremities at knees and hips, though she does describe some pain during motion.  She is especially tender with dorsiflexion of her right foot. Integumentary: No obvious rashes, lesions, trauma on general exam with exception of right dorsum eccymosis. Neuro: A & O to self and place today   Laboratory: I have personally read and reviewed all labs and imaging studies.  CBC: Recent Labs  Lab 05/17/19 1655 05/18/19 0236 05/19/19 0358 05/20/19 0519  WBC 10.5 9.4 6.7 8.0  NEUTROABS 8.5*  --   --   --   HGB 11.0* 9.7* 9.0* 9.8*  HCT 30.2* 27.1* 25.2* 27.6*  MCV 87.3 88.3 90.0 90.8  PLT 313 290 261 285   CMP: Recent Labs  Lab 05/17/19 1655  05/18/19 0236  05/19/19 0358  05/19/19 2321 05/20/19 0519 05/20/19 0737 05/20/19 1142 05/21/19 0257  NA 109*   < > 112*   < > 123*   < >  125* 126* 126* 126* 127*  K 4.1  --  3.4*  --  2.9*  --   --  4.5  --   --  4.0  CL 74*  --  83*  --  96*  --   --  97*  --   --  94*  CO2 22  --  20*  --  19*  --   --  22  --   --  25  GLUCOSE 123*  --  107*  --  95  --   --  119*  --   --  122*  BUN 17  --  14  --  14  --   --  20  --   --  12  CREATININE 0.73  --  0.61  --  0.59  --   --  0.67  --   --  0.65  CALCIUM 9.5  --  8.3*  --  8.0*  --   --  8.8*  --   --  9.1  MG  --   --  1.8  --   --   --   --  1.9  --   --   --   PHOS  --   --  3.4  --   --   --   --  2.0*  --   --   --   ALBUMIN 3.6  --   --   --  2.6*  --   --   --   --   --   --    < > = values in this interval not displayed.   GFR: Estimated Creatinine Clearance: 34.7 mL/min (by C-G formula based on SCr of 0.65 mg/dL).  Liver Function Tests: Recent Labs  Lab 05/17/19 1655 05/19/19 0358  AST 21 14*  ALT 19 16  ALKPHOS 119 92  BILITOT 1.0 0.7  PROT 7.4 5.4*  ALBUMIN 3.6 2.6*   CBG: Recent  Labs  Lab 05/17/19 2307  GLUCAP 135*    Urine analysis: No results for input(s): COLORURINE, APPEARANCEUR, LABSPEC, PHURINE, GLUCOSEU, HGBUR, BILIRUBINUR, KETONESUR, PROTEINUR, UROBILINOGEN, NITRITE, LEUKOCYTESUR in the last 72 hours.   Micro:  05/17/19 Urine culture- 50,000 ecoli, FINAL   Imaging/Diagnostic Tests: R shoulder xray 7/27 > comminuted fx of right humerus. R hip xray 7/27 > negative. CT head / Cspine 7/27 >no acute process. Advanced cervical spondylosis most severe at C5-C6 with severe spinal stenosis. CT pelvis 7/27 > negative. R & L Knee xray 7/28 > no acute abnormalities, age related changes     EKG Interpretation  Date/Time:  Monday May 17 2019 16:33:47 EDT Ventricular Rate:  87 PR Interval:    QRS Duration: 103 QT Interval:  410 QTC Calculation: 494 R Axis:   -83 Text Interpretation:  Sinus rhythm RSR' in V1 or V2, probably normal variant Left ventricular hypertrophy Inferior infarct, old Artifact No significant change since last tracing Confirmed by Dorie Rank [phone]) on 05/17/2019 4:37:53 PM       Procedures:  Esophogram: unspecified esophageal motility disorder    Wilber Oliphant, MD 05/21/2019, 8:04 AM PGY-1, [place] Intern pager: [phone], text pages welcome

## 2019-05-21 NOTE — Progress Notes (Signed)
Late documentation, event occurred around 0100.   Paged by RN that patient pulled out her peripheral IV around 0001.  Patient unable to sleep, does not want this replaced.  Went to evaluate patient at bedside, used [place] telephone interpreter for encounter.  By arrival to the floor, RN placed new IV in different arm (R).  Patient very irritated and frustrated by this IV, stating she would like it removed.  Confused similar to previous reports (believes she is in [place] and that it is 1970), but not aggressive or harm to herself/others.  Through chart review, sodium has been stable around 126 with discussions on possible discharge today, 7/31 or tomorrow.  Believe it would be reasonable to trial her off of fluids (only at 50 mL/hour previously) to see if she can maintain this on her own.  Will remove IV for patient comfort.  Discussed with patient that there is a possibility we may need to replace this if her sodium worsens. Check Na this am as scheduled.   Additionally, she continues to complain of pain in her left foot.  Can move leg/foot spontaneously, ecchymosis on dorsum of her left foot similar to previous.  Neurovascularly intact distally without LE swelling.  Will place K pad and lidocaine patch, continue Tylenol.  Patriciaann Clan, DO

## 2019-05-22 DIAGNOSIS — Z789 Other specified health status: Secondary | ICD-10-CM

## 2019-05-22 LAB — BASIC METABOLIC PANEL
Anion gap: 12 (ref 5–15)
BUN: 21 mg/dL (ref 8–23)
CO2: 22 mmol/L (ref 22–32)
Calcium: 9.3 mg/dL (ref 8.9–10.3)
Chloride: 92 mmol/L — ABNORMAL LOW (ref 98–111)
Creatinine, Ser: 0.73 mg/dL (ref 0.44–1.00)
GFR calc Af Amer: >60 mL/min (ref >60)
GFR calc non Af Amer: >60 mL/min (ref >60)
Glucose, Bld: 117 mg/dL — ABNORMAL HIGH (ref 70–99)
Potassium: 4.1 mmol/L (ref 3.5–5.1)
Sodium: 126 mmol/L — ABNORMAL LOW (ref 135–145)

## 2019-05-22 NOTE — Progress Notes (Signed)
Orthopedic Tech Progress Note Patient Details:  Barbara Dennis Apr 13, 1934 [id number]  Ortho Devices Type of Ortho Device: Postop shoe/boot Ortho Device/Splint Location: LRE Ortho Device/Splint Interventions: Adjustment, Application, Ordered   Post Interventions Patient Tolerated: Well Instructions Provided: Care of device, Adjustment of device   [name] 05/22/2019, 3:55 PM

## 2019-05-22 NOTE — Progress Notes (Signed)
Family Medicine Teaching Service Daily Progress Note Intern Pager: [phone]  Patient name: Barbara Dennis Medical record number: [id number] Date of birth: 1934/06/29 Age: 85 y.o. Gender: female  Primary Care Provider: Horald Pollen, MD Consultants: ENT, ortho Code Status: Full Code  Primary Emergency Contact: [place], Home Phone: [phone]   Pt Overview and Major Events to Date:  Hospital Day: 6 05/17/2019: admitted for Fall, Hip Pain, and Shoulder Pain   Assessment and Plan: Barbara Dennis is a 85 y.o. female who presented w/ hyponatremia to 109, frequent falls, and acute dysphagia.  PMHx s/f ACD, wedge compression fx T11-T12, hx, frequent falls, COPD, tobacco use, severe cervical degeneration and stenosis, age related osteoporosis with right humeral fracture, GERD. Uses walker, wheelchair and bedside commode via home health at home.   Right foot pain  PT OT recommending SNF or 24-hour assistance.  Spoke to family yesterday and they would like patient to come back if she was able to be stronger on her own.  However, they would like patient to go to SNF if she requires further rehab.  They are aware that they will not be able to visit the patient if she goes to the skilled nursing facility due to [place] restrictions.  Switch naproxen to baclofen yesterday.  On call for pain overnight.  MRI foot was significant for distal Achilles tendinopathy, foot flexor nonspecific myositis (unclear if neurogenic or inflammatory), moderate osteoporosis with no signs of acute fracture. --Baclofen twice daily, tylenol PRN  for pain --Contact PT and OT for further recommendations about SNF --Stiff soled shoe -- continue tylenol  --Consider obtaining CK for myositis  Confusion, improving  Patient continues to improve daily.  Was going to order MRI head yesterday but with improvement, decided against.  Likely combination of hospital delirium, acute hyponatremia, and some chronic. -- continue  to monitor -- ramelteon QHS   Dysphagia, improved Patient with dysphasia and decreased p.o. intake on arrival.  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  Could also consider neck masses as source of dysphasia.  Overall, patient's p.o. intake has improved and she is eating on her own. --Continue home omeprazole  Hx HTN  BP in evening and early morning wnl. Most recent 146/65 -- continue amlodipine to 10mg  BID   Frequent Falls  Likely due to right foot pain and generalized weakness given age.  Patient will need to continue PT OT for strength. -- PT / OT   Severe hyponatremia, Acute on chronic, resolved Sodium is 126 this morning which is stable from yesterday. Patient has a baseline of low Na in previous labs starting in 2017. Caretaker did inform me that patient was taking laxatives to help with constipation prior to admission.  This could also likely lead to hyponatremia the extrarenal losses. --Continue p.o. intake  Right neck mass  Unfortunately, unable to obtain biopsy while patient is inpatient due to scheduling.  She will need to have this scheduled as outpatient.  We will try to schedule for her prior to discharge. -- Will contact IR for further scheduling -- o/p f/u  Osteoporosis T score -4.5 With acute Right humerus Fx  -- consulted ortho, no further intervention needed at this time.  Continue sling. -- f/u oupatient   Known advanced cervical spondylosis with moderate to severe spnial stenosis  -- PT/OT -- o/p f/u    Bacteruria  Asymptomatic.  50,000 CFU E. coli per micro. --No treatment at this time  #FEN/GI:  .  Fluids: None . Electrolytes: Replete as needed . Nutrition: Dysphagia I diet   Access: none VTE prophylaxis: heparin  GI ppx: 40 ommeprazole   Disposition: Home SNF  pending improvement of Na and dysphagia.   Subjective:  Patient had episode of acute pain overnight and distress as she  wanted to go home.  See notes.   Objective: Temp:  [97.7 F (36.5 C)-98.1 F (36.7 C)] 97.9 F (36.6 C) (08/01 0609) Pulse Rate:  [76-88] 76 (08/01 0904) Cardiac Rhythm: Normal sinus rhythm (08/01 0700) Resp:  [16] 16 (07/31 1300) BP: (124-148)/(65-74) 146/65 (08/01 0904) SpO2:  [99 %] 99 % (08/01 0609) Weight:  [42.6 kg] 42.6 kg (08/01 0609) 07/31 0701 - 08/01 0700 In: 48 [P.O.:590] Out: 300 [Urine:300]   Physical Exam: General: NAD, non-toxic, elderly [place] woman, sleeping in bed, sitting up. Awakes to name.  HEENT: [place]/AT. PERRLA. EOMI.  Cardiovascular: RRR, normal S1, S2. B/L 2+ RP. N BLEE Respiratory: CTAB. No IWOB.  Abdomen: + BS. NT, ND, soft to palpation.  Extremities: Warm and well perfused. Limits right leg movement. Mild tenderness of anterior quad and calf muscles.  She has good active range of motion in her lower extremities at knees and hips, though she does describe some pain during motion.  She continues to be especially tender with dorsiflexion of her right foot.  Integumentary: No obvious rashes, lesions, trauma on general exam with exception of right dorsum eccymosis. Neuro: A & O to self and place today  Laboratory: I have personally read and reviewed all labs and imaging studies.  CBC: Recent Labs  Lab 05/17/19 1655 05/18/19 0236 05/19/19 0358 05/20/19 0519  WBC 10.5 9.4 6.7 8.0  NEUTROABS 8.5*  --   --   --   HGB 11.0* 9.7* 9.0* 9.8*  HCT 30.2* 27.1* 25.2* 27.6*  MCV 87.3 88.3 90.0 90.8  PLT 313 290 261 285   CMP: Recent Labs  Lab 05/17/19 1655  05/18/19 0236  05/19/19 0358  05/20/19 0519 05/20/19 0737 05/20/19 1142 05/21/19 0257 05/22/19 0240  NA 109*   < > 112*   < > 123*   < > 126* 126* 126* 127* 126*  K 4.1  --  3.4*  --  2.9*  --  4.5  --   --  4.0 4.1  CL 74*  --  83*  --  96*  --  97*  --   --  94* 92*  CO2 22  --  20*  --  19*  --  22  --   --  25 22  GLUCOSE 123*  --  107*  --  95  --  119*  --   --  122* 117*  BUN 17  --  14   --  14  --  20  --   --  12 21  CREATININE 0.73  --  0.61  --  0.59  --  0.67  --   --  0.65 0.73  CALCIUM 9.5  --  8.3*  --  8.0*  --  8.8*  --   --  9.1 9.3  MG  --   --  1.8  --   --   --  1.9  --   --   --   --   PHOS  --   --  3.4  --   --   --  2.0*  --   --   --   --   ALBUMIN 3.6  --   --   --  2.6*  --   --   --   --   --   --    < > = values in this interval not displayed.   GFR: Estimated Creatinine Clearance: 35.8 mL/min (by C-G formula based on SCr of 0.73 mg/dL).  Liver Function Tests: Recent Labs  Lab 05/17/19 1655 05/19/19 0358  AST 21 14*  ALT 19 16  ALKPHOS 119 92  BILITOT 1.0 0.7  PROT 7.4 5.4*  ALBUMIN 3.6 2.6*   CBG: Recent Labs  Lab 05/17/19 2307  GLUCAP 135*    Urine analysis: No results for input(s): COLORURINE, APPEARANCEUR, LABSPEC, PHURINE, GLUCOSEU, HGBUR, BILIRUBINUR, KETONESUR, PROTEINUR, UROBILINOGEN, NITRITE, LEUKOCYTESUR in the last 72 hours.   Micro:  05/17/19 Urine culture- 50,000 ecoli, FINAL   Imaging/Diagnostic Tests: R shoulder xray 7/27 > comminuted fx of right humerus. R hip xray 7/27 > negative. CT head / Cspine 7/27 >no acute process. Advanced cervical spondylosis most severe at C5-C6 with severe spinal stenosis. CT pelvis 7/27 > negative. R & L Knee xray 7/28 > no acute abnormalities, age related changes     Procedures:  Esophogram: unspecified esophageal motility disorder    Wilber Oliphant, MD 05/22/2019, 9:42 AM PGY-1, [place] Intern pager: [phone], text pages welcome

## 2019-05-23 ENCOUNTER — Inpatient Hospital Stay (HOSPITAL_COMMUNITY): Payer: Medicare Other

## 2019-05-23 DIAGNOSIS — M79671 Pain in right foot: Secondary | ICD-10-CM

## 2019-05-23 DIAGNOSIS — M79672 Pain in left foot: Secondary | ICD-10-CM

## 2019-05-23 LAB — BASIC METABOLIC PANEL
Anion gap: 9 (ref 5–15)
BUN: 36 mg/dL — ABNORMAL HIGH (ref 8–23)
CO2: 26 mmol/L (ref 22–32)
Calcium: 9.3 mg/dL (ref 8.9–10.3)
Chloride: 96 mmol/L — ABNORMAL LOW (ref 98–111)
Creatinine, Ser: 0.82 mg/dL (ref 0.44–1.00)
GFR calc Af Amer: >60 mL/min (ref >60)
GFR calc non Af Amer: >60 mL/min (ref >60)
Glucose, Bld: 130 mg/dL — ABNORMAL HIGH (ref 70–99)
Potassium: 4 mmol/L (ref 3.5–5.1)
Sodium: 131 mmol/L — ABNORMAL LOW (ref 135–145)

## 2019-05-23 MED ORDER — SENNA 8.6 MG PO TABS: 1.0000 | ORAL_TABLET | Freq: Every day | ORAL | Status: DC | PRN

## 2019-05-23 MED ORDER — POLYETHYLENE GLYCOL 3350 17 G PO PACK
17.0000 g | PACK | Freq: Every day | ORAL | Status: DC
  Administered 2019-05-23 – 2019-05-29 (×6): 17 g via ORAL
  Filled 2019-05-23 (×6): qty 1

## 2019-05-23 NOTE — Progress Notes (Signed)
Request to IR for image guided biopsy of bilateral neck masses - this was originally placed as an outpatient however patient admitted last week for severe hyponatremia, AMS. Currently on Family Medicine Teaching Service.   We were asked to perform biopsy last week while patient was admitted, however unfortunately due to scheduling this was not possible. It was recommended that patient be setup for outpatient biopsy if she was to be discharged over the weekend. Upon chart check today it appears patient is to remain inpatient.   Dr. Vernard Gambles has reviewed patient images and approved patient for FNA of bilateral neck masses. I will tentatively schedule this to be done in [place] on Monday 8/3 pending emergent procedures.   I have placed orders for patient to be NPO after midnight on 8/3, AM labs, hold heparin until post procedure. Full consult/consent to follow.   Please call IR with questions or concerns.  Candiss Norse, PA-C

## 2019-05-23 NOTE — Progress Notes (Signed)
Patient was transported to MRI on telemetry. Upon arrival to unit, patient's telemetry bag was not at bedside. MRI called x 2 and stated they do not have a telemetry box down there. Patient moved to different telemetry box. Charge nurse made aware.

## 2019-05-23 NOTE — Progress Notes (Signed)
Patient struggling immensely while attempting to feed herself. Fine motor skills are intact however patient lacks strength in left arm to successfully scoop food off tray. Patient will be continue to be assisted with meals.  Also noticed patient prefers cold items, ice cream, sherbet, applesauce, to main course warm meals.  Hiram Comber, RN 05/23/2019 5:38 PM

## 2019-05-23 NOTE — Progress Notes (Signed)
Patient not communicating with nurse via stratus interrupter this morning. Continues to shake head, does not respond to questions. Educated and administered morning medications without any difficulty. Patient sat up for breakfast. Sling in place. Will continue to monitor.   Hiram Comber, RN 05/23/2019 8:11 AM

## 2019-05-23 NOTE — Progress Notes (Signed)
Family Medicine Teaching Service Daily Progress Note Intern Pager: [phone]  Patient name: Barbara Dennis Medical record number: [id number] Date of birth: 01-11-1934 Age: 85 y.o. Gender: female  Primary Care Provider: Horald Pollen, MD Consultants: ortho Code Status: Full  Pt Overview and Major Events to Date:  7/27: admitted for falls, hyponatremia  Assessment and Plan: Barbara Dennis is a 85 y.o. female who presented w/ hyponatremia to 109, frequent falls, and acute dysphagia. PMHx s/f ACD, wedge compression fx T11-T12, hx, frequent falls, COPD, tobacco use, severe cervical degeneration and stenosis, age related osteoporosis with right humeral fracture, GERD.   ?AMS, unclear baseline Patient ANO x2.  It may be language barrier or may be patient's baseline.  Likely combination of hospital delirium, acute hyponatremia, and some chronic However per family she was doing all of ADLs prior to 1 week before admission.  This could be secondary to her resolved hyponatremia, however we do not want to miss other cause given lack of imaging. - MRI brain without ordered today for further evaluation of AMS  - ramelteon QHS   Right foot pain PT OT recommending SNF or 24-hour assistance. MRI foot was significant for distal Achilles tendinopathy, foot flexor nonspecific myositis (unclear if neurogenic or inflammatory), moderate osteoporosis with no signs of acute fracture. -  Will d/c baclofen due to ?AMS - Contact PT and OT for further recommendations about SNF - Stiff soled shoe with PT - continue tylenol   Dysphagia, improved Patient with dysphasia and decreased p.o. intake on arrival.  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  Could also consider neck masses as source of dysphasia.  Overall, patient's p.o. intake has improved and she is eating on her own. -Continue home omeprazole  Hx HTN  BP in evening and early morning  wnl. Most recent 146/65 -- continue amlodipine to 10mg  BID   Frequent Falls Likely due to right foot pain and generalized weakness given age.  Patient will need to continue PT OT for strength. -- PT / OT   Constipation Patient has not had documented bowel movement since admission.  -MiraLAX and senna prn   Severe hyponatremia, Acute on chronic, resolved Sodium is 126 this morning which is stable from yesterday. Patient has a baseline of low Barbara in previous labs starting in 2017. Caretaker did inform me that patient was taking laxatives to help with constipation prior to admission.  This could also likely lead to hyponatremia the extrarenal losses. --Continue p.o. intake  Right neck mass  Unfortunately, unable to obtain biopsy while patient is inpatient due to scheduling.  She will need to have this scheduled as outpatient.  We will try to schedule for her prior to discharge if she is not able to be evaluated while admitted. -- Will contact IR on 8/3 for possible inpatient biopsy --If unable to obtain biopsy inpatient, ensure outpatient follow-up  Osteoporosis T score -4.5 With acute Right humerus Fx  -- consulted ortho, no further intervention needed at this time.  Continue sling. -- f/u oupatient  Known advanced cervical spondylosis with moderate to severe spnial stenosis  -- PT/OT -- o/p f/u   #FEN/GI:   Fluids: None  Electrolytes: Replete as needed  Nutrition: Dysphagia I diet   VTE prophylaxis: heparin   Disposition: pending disposition  Subjective:  Patient states that her foot is hurting and she has not received any medication for this morning.  She states that she has no other  complaints at this time.  Objective: Temp:  [97.5 F (36.4 C)-98.5 F (36.9 C)] 97.5 F (36.4 C) (08/02 0635) Pulse Rate:  [77-103] 85 (08/02 0807) Resp:  [16-18] 18 (08/02 0635) BP: (102-146)/(61-102) 146/78 (08/02 0807) SpO2:  [95 %-99 %] 99 % (08/02 0635) Weight:  [45.8  kg] 45.8 kg (08/02 0500) Physical Exam: General: NAD, pleasant, sitting up in bed Cardiovascular: RRR, 2/6 murmur over aortic opost, no LE edema Respiratory: CTA BL, normal work of breathing MSK: moves 4 extremities equally Derm: no rashes appreciated Neuro: CN II-XII grossly intact Psych: AOx3, appropriate affect  Laboratory: Recent Labs  Lab 05/18/19 0236 05/19/19 0358 05/20/19 0519  WBC 9.4 6.7 8.0  HGB 9.7* 9.0* 9.8*  HCT 27.1* 25.2* 27.6*  PLT 290 261 285   Recent Labs  Lab 05/17/19 1655  05/19/19 0358  05/21/19 0257 05/22/19 0240 05/23/19 0347  Barbara 109*   < > 123*   < > 127* 126* 131*  K 4.1   < > 2.9*   < > 4.0 4.1 4.0  CL 74*   < > 96*   < > 94* 92* 96*  CO2 22   < > 19*   < > 25 22 26   BUN 17   < > 14   < > 12 21 36*  CREATININE 0.73   < > 0.59   < > 0.65 0.73 0.82  CALCIUM 9.5   < > 8.0*   < > 9.1 9.3 9.3  PROT 7.4  --  5.4*  --   --   --   --   BILITOT 1.0  --  0.7  --   --   --   --   ALKPHOS 119  --  92  --   --   --   --   ALT 19  --  16  --   --   --   --   AST 21  --  14*  --   --   --   --   GLUCOSE 123*   < > 95   < > 122* 117* 130*   < > = values in this interval not displayed.    Imaging/Diagnostic Tests:   Barbara Dennis, [place], DO 05/23/2019, 9:38 AM PGY-3, [place] Intern pager: [phone], text pages welcome

## 2019-05-24 LAB — BASIC METABOLIC PANEL
Anion gap: 8 (ref 5–15)
BUN: 36 mg/dL — ABNORMAL HIGH (ref 8–23)
CO2: 23 mmol/L (ref 22–32)
Calcium: 9.1 mg/dL (ref 8.9–10.3)
Chloride: 98 mmol/L (ref 98–111)
Creatinine, Ser: 0.76 mg/dL (ref 0.44–1.00)
GFR calc Af Amer: >60 mL/min (ref >60)
GFR calc non Af Amer: >60 mL/min (ref >60)
Glucose, Bld: 125 mg/dL — ABNORMAL HIGH (ref 70–99)
Potassium: 4.8 mmol/L (ref 3.5–5.1)
Sodium: 129 mmol/L — ABNORMAL LOW (ref 135–145)

## 2019-05-24 LAB — CBC
HCT: 28.2 % — ABNORMAL LOW (ref 36.0–46.0)
Hemoglobin: 9.7 g/dL — ABNORMAL LOW (ref 12.0–15.0)
MCH: 32 pg (ref 26.0–34.0)
MCHC: 34.4 g/dL (ref 30.0–36.0)
MCV: 93.1 fL (ref 80.0–100.0)
Platelets: 291 10*3/uL (ref 150–400)
RBC: 3.03 MIL/uL — ABNORMAL LOW (ref 3.87–5.11)
RDW: 13.5 % (ref 11.5–15.5)
WBC: 8.4 10*3/uL (ref 4.0–10.5)
nRBC: 0 % (ref 0.0–0.2)

## 2019-05-24 LAB — T4, FREE: Free T4: 1.47 ng/dL — ABNORMAL HIGH (ref 0.61–1.12)

## 2019-05-24 LAB — HIV ANTIBODY (ROUTINE TESTING W REFLEX): HIV Screen 4th Generation wRfx: NONREACTIVE

## 2019-05-24 LAB — PROTIME-INR
INR: 1 (ref 0.8–1.2)
Prothrombin Time: 12.6 seconds (ref 11.4–15.2)

## 2019-05-24 LAB — TSH: TSH: 0.259 u[IU]/mL — ABNORMAL LOW (ref 0.350–4.500)

## 2019-05-24 LAB — RPR: RPR Ser Ql: NONREACTIVE

## 2019-05-24 NOTE — Progress Notes (Signed)
Physical Therapy Treatment Patient Details Name: Barbara Dennis MRN: [id number] DOB: [birth year] Today's Date: 05/24/2019    History of Present Illness 85 yo female admitted with hyponatremia and frequent falls. Found to have rt shoulder fx. PMH - htn, copd, hyponatremia, rt rib fx's, T12 fx, frequent falls   PT Comments    Pt was seen for mobility and strength training, and was minimally able to work sitting side of bed via translator in [place].  She is willing to try, and with a sling on R arm did try to sit up for 10 mins as well as letting PT do a minimal amount of LE ROM.  Follow acutely and noted her imaging results for R foot, with significant bruising on dorsum of foot today.  Nursing is using a warm pack, which pt is asking to have back on when PT finished. Continue on with mobility and standing attempts as pt will permit.   Follow Up Recommendations  SNF;Supervision/Assistance - 24 hour     Equipment Recommendations  Other (comment)    Recommendations for Other Services       Precautions / Restrictions Precautions Precautions: Fall Precaution Comments: sling to support Restrictions Weight Bearing Restrictions: Yes RUE Weight Bearing: Non weight bearing    Mobility  Bed Mobility Overal bed mobility: Needs Assistance Bed Mobility: Supine to Sit;Sit to Supine     Supine to sit: Max assist Sit to supine: Max assist   General bed mobility comments: lifting both trunk and legs into and out of bed  Transfers                 General transfer comment: declined  Ambulation/Gait                 Stairs             Wheelchair Mobility    Modified Rankin (Stroke Patients Only)       Balance     Sitting balance-Leahy Scale: Fair                                      Cognition Arousal/Alertness: Awake/alert Behavior During Therapy: Flat affect Overall Cognitive Status: No family/caregiver present to determine baseline  cognitive functioning                                 General Comments: interpreter was able to get some helpful information from pt      Exercises General Exercises - Lower Extremity Long Arc Quad: AAROM;Both;10 reps    General Comments General comments (skin integrity, edema, etc.): pt is significantly more uncomfortable than her appearance in her verbal rating but cannot tolerate any wb on R leg, bruising on top of foot      Pertinent Vitals/Pain Pain Assessment: 0-10 Pain Score: 10-Worst pain ever(via interpreter) Pain Location: feet and back Pain Descriptors / Indicators: Grimacing Pain Intervention(s): Limited activity within patient's tolerance;Monitored during session;Premedicated before session;Repositioned    Home Living                      Prior Function            PT Goals (current goals can now be found in the care plan section) Acute Rehab PT Goals Patient Stated Goal: none stated Progress towards PT goals: Progressing toward goals  Frequency    Min 2X/week      PT Plan Current plan remains appropriate    Co-evaluation              AM-PAC PT "6 Clicks" Mobility   Outcome Measure  Help needed turning from your back to your side while in a flat bed without using bedrails?: A Lot Help needed moving from lying on your back to sitting on the side of a flat bed without using bedrails?: A Lot Help needed moving to and from a bed to a chair (including a wheelchair)?: A Lot Help needed standing up from a chair using your arms (e.g., wheelchair or bedside chair)?: Total Help needed to walk in hospital room?: Total Help needed climbing 3-5 steps with a railing? : Total 6 Click Score: 9    End of Session Equipment Utilized During Treatment: Other (comment)(R arm sling, new ortho R shoe) Activity Tolerance: Patient limited by pain Patient left: in bed;with call bell/phone within reach;with bed alarm set;Other  (comment)(contacted CNA to replace purwick) Nurse Communication: Mobility status PT Visit Diagnosis: Other abnormalities of gait and mobility (R26.89);Pain Pain - Right/Left: Right Pain - part of body: Shoulder;Ankle and joints of foot     Time: 1586-8257 PT Time Calculation (min) (ACUTE ONLY): 21 min  Charges:  $Therapeutic Activity: 8-22 mins              Ramond Dial 05/24/2019, 3:45 PM   Mee Hives, PT MS Acute Rehab Dept. Number: [place]

## 2019-05-24 NOTE — Progress Notes (Signed)
SLP Cancellation Note  Patient Details Name: Barbara Dennis MRN: [id number] DOB: 01/22/1934   Cancelled treatment:       Reason Eval/Treat Not Completed: (pt npo for possible BX, will continue efforts)   Macario Golds 05/24/2019, 7:33 AM  Luanna Salk, MS [facility] SLP Acute Rehab Services Pager [phone] Office [phone]

## 2019-05-24 NOTE — Progress Notes (Addendum)
FMTS Attending Daily Note: Barbara Singh, MD  Team Pager [phone] Pager [phone]  I have seen and examined this patient, reviewed their chart. I have discussed this patient with the resident. I agree with the resident's findings, assessment and care plan.    The patient non-English speaking. An interpreter was used for the entire visit.   See my addendum as below.  The patient continues to improve.  Patient's daughter, who is her caregiver, her biological daughters at bedside. She is back to her baseline mental status.  Her right foot continues to bother her.  Family Medicine Teaching Service Daily Progress Note Intern Pager: [phone]  Patient name: Barbara Dennis Medical record number: [id number] Date of birth: [birth year] Age: 85 y.o. Gender: female  Primary Care Provider: Horald Pollen, MD Consultants: ortho Code Status: Full  Pt Overview and Major Events to Date:  7/27: admitted for falls, hyponatremia 8/4: Biopsy of neck mass  (parotid likely)   Assessment and Plan: [name] is a 85 y.o. female who presented w/ hyponatremia to 109, frequent falls, and acute dysphagia. PMHx s/f ACD, wedge compression fx T11-T12, hx, frequent falls, COPD, tobacco use, severe cervical degeneration and stenosis, age related osteoporosis with right humeral fracture, GERD.   Right neck mass  IR consulted and possible biopsy via [place] today given no emergent procedures.  --See additional documentation regarding consent. -- resume diet after procedure   AMS, resolved Daughter at bedside this morning and reports that patient seems at her baseline. She does note that she has had some chronic memory loss, but that is not new. MRI brain yesterday negative. Patient is stable for discharge from a medical standpoint once her biopsies have been finished.  -- SW consulted for SNF placement. Patient is  -- continue ramelteon QHS   Right foot pain Imaging obtained and there is no acute abnormality  except for foot flexor nonspecific myositis.   Can continue to work-up inflammatory myositis.  This would include thyroid function testing, sooner muscle enzymes (CPK, aldolase, AST/ALT, LDH, serum myoglobin), ESR and CRP and ANA. Seungmi (patient's caretaker) is to come to the hospital to visit today and talk to patient about discharge to SNF so that patient better understands what is happening. Dc'ed baclofen yesterday. -- outpatient podiatry or rheumatology f/u?  -- follow up labs   Dysphagia, improved Continue to work with SLP. Doing well. Prefers cold items.  -Continue home omeprazole  Hx HTN  -- continue amlodipine to 11m BID   Frequent Falls Likely due to right foot pain and generalized weakness given age and right foot pain.  Patient will need to continue PT OT for strength. -- PT / OT   RT humeral fracture  -- patient to remain in sling   Language Barrier affecting care discussed importance of using interpreter for all encounters including administration of medications with staff.  Constipation Patient has not had documented bowel movement since admission.   -MiraLAX and senna prn   Severe hyponatremia, Acute on chronic, resolved  Osteoporosis T score -4.5 With acute Right humerus Fx Known advanced cervical spondylosis with moderate to severe spnial stenosis  -- o/p f/u   #FEN/GI:   Fluids: None  Electrolytes: Replete as needed  Nutrition: Dysphagia I diet   VTE prophylaxis: heparin   Disposition: pending disposition  Subjective:  The patient reports she is doing well this morning.  She reports that her right foot continues to bother her but is slightly better.  She is a alert  and oriented to person and place.  She does not know the year.  An interpreter was used to elicit this history.  Objective: Temp:  [97.7 F (36.5 C)-97.8 F (36.6 C)] 97.7 F (36.5 C) (08/03 0502) Pulse Rate:  [95-103] 103 (08/03 0502) Resp:  [13-17] 13 (08/03 0502) BP:  (106-177)/(60-72) 119/67 (08/03 0839) SpO2:  [96 %-100 %] 100 % (08/03 0502) Weight:  [49.9 kg] 49.9 kg (08/03 0500) Physical Exam: General: NAD, pleasant, sitting up in bed, thin elderly patient Cardiovascular: RRR, 2/6 murmur over aortic opost, no LE edema Respiratory: CTA BL, normal work of breathing MSK: moves 4 extremities equally Derm: no rashes appreciated Neuro: CN II-XII grossly intact, alert to person and place.  Laboratory: Recent Labs  Lab 05/19/19 0358 05/20/19 0519 05/24/19 0225  WBC 6.7 8.0 8.4  HGB 9.0* 9.8* 9.7*  HCT 25.2* 27.6* 28.2*  PLT 261 285 291   Recent Labs  Lab 05/17/19 1655  05/19/19 0358  05/22/19 0240 05/23/19 0347 05/24/19 0225  NA 109*   < > 123*   < > 126* 131* 129*  K 4.1   < > 2.9*   < > 4.1 4.0 4.8  CL 74*   < > 96*   < > 92* 96* 98  CO2 22   < > 19*   < > 22 26 23   BUN 17   < > 14   < > 21 36* 36*  CREATININE 0.73   < > 0.59   < > 0.73 0.82 0.76  CALCIUM 9.5   < > 8.0*   < > 9.3 9.3 9.1  PROT 7.4  --  5.4*  --   --   --   --   BILITOT 1.0  --  0.7  --   --   --   --   ALKPHOS 119  --  92  --   --   --   --   ALT 19  --  16  --   --   --   --   AST 21  --  14*  --   --   --   --   GLUCOSE 123*   < > 95   < > 117* 130* 125*   < > = values in this interval not displayed.   Wilber Oliphant, MD 05/24/2019, 9:06 AM PGY-3, [place] Intern pager: [phone], text pages welcome

## 2019-05-24 NOTE — Progress Notes (Signed)
Patient requiring MD consent for bilateral biopsies with IR as patient is unable to consent for herself and her caretakers are technically not family. Consent given. Patient will be scheduled for biopsies 8/4 morning.  Wilber Oliphant, M.D.  PGY-2  Family Medicine  [phone] 05/24/2019 2:15 PM

## 2019-05-24 NOTE — Consult Note (Signed)
Chief Complaint: Patient was seen in consultation today for Bilateral neck mass biopsies Chief Complaint  Patient presents with   Fall   Hip Pain   Shoulder Pain   at the request of Dr Vista Lawman   Supervising Physician: Corrie Mckusick  Patient Status: [facility] - In-pt  History of Present Illness: Barbara Dennis is a 85 y.o. female   [place] woman Admitted 7/27: weakness; falls at home Hyponatremia Hx COPD; tobacco use; osteoporosis  Work up reveals neck masses-- previously known Only followed - no biopsy per chart  CT 7/27: bilateral soft tissue masses in the upper neck are stable. Right-sided soft tissue mass with punctate calcification deep to the sternocleidomastoid muscle measures 20 x 37 mm. Soft tissue mass in the region of the parotid tail on the left unchanged measuring 18 x 19 mm.  Request for neck masses biopsy Dr Vernard Gambles has approved biopsies  Past Medical History:  Diagnosis Date   Anemia of chronic disease    GERD (gastroesophageal reflux disease)    Hypertension    Mass of right side of neck    Osteoporosis    Spinal stenosis    Tobacco abuse    Urinary incontinence     Past Surgical History:  Procedure Laterality Date   APPENDECTOMY     BIOPSY  08/24/2018   Procedure: BIOPSY;  Surgeon: Thornton Park, MD;  Location: [facility];  Service: Gastroenterology;;   ESOPHAGOGASTRODUODENOSCOPY (EGD) WITH PROPOFOL N/A 08/24/2018   Procedure: ESOPHAGOGASTRODUODENOSCOPY (EGD) WITH PROPOFOL;  Surgeon: Thornton Park, MD;  Location: [facility];  Service: Gastroenterology;  Laterality: N/A;    Allergies: Patient has no known allergies.  Medications: Prior to Admission medications   Medication Sig Start Date End Date Taking? Authorizing Provider  acetaminophen (TYLENOL) 325 MG tablet Take 325-650 mg by mouth 3 (three) times daily as needed for headache (pain).   Yes [provider]  amLODipine (NORVASC) 2.5 MG tablet Take 1  tablet (2.5 mg total) by mouth daily. 12/19/18 12/19/19 Yes Georgette Shell, MD  bismuth subsalicylate (PEPTO BISMOL) 262 MG chewable tablet Chew 262 mg by mouth 4 (four) times daily as needed (constipation).   Yes [provider]  cholecalciferol (VITAMIN D3) 25 MCG (1000 UT) tablet Take 1,000 Units by mouth daily.   Yes [provider]  ibandronate (BONIVA) 150 MG tablet Take 1 tablet (150 mg total) by mouth every 30 (thirty) days. 03/22/19  Yes Renato Shin, MD  lisinopril (PRINIVIL,ZESTRIL) 40 MG tablet Take 1 tablet (40 mg total) by mouth daily. 12/17/18  Yes Sagardia, Ines Bloomer, MD  Multiple Minerals-Vitamins (CAL-MAG-ZINC-D) TABS Take 1 tablet by mouth daily.   Yes [provider]  Multiple Vitamin (MULTIVITAMIN WITH MINERALS) TABS tablet Take 1 tablet by mouth daily. 09/18/18  Yes Hennie Duos, MD  omeprazole (PRILOSEC) 20 MG capsule TAKE 2 CAPSULES BY MOUTH TWICE DAILY BEFORE A MEAL Patient taking differently: Take 20 mg by mouth 2 (two) times daily before a meal.  09/18/18  Yes Hennie Duos, MD  OVER THE COUNTER MEDICATION Take 1 tablet by mouth 2 (two) times daily as needed (pain/inflammation). Anti-inflammatory from [place]   Yes [provider]  oxybutynin (DITROPAN-XL) 10 MG 24 hr tablet Take 10 mg by mouth at bedtime.  11/12/18  Yes [provider]  acetaminophen (TYLENOL) 500 MG tablet Take 2 tablets (1,000 mg total) by mouth every 8 (eight) hours. Patient not taking: Reported on 05/17/2019 07/23/18   Jill Alexanders, PA-C  albuterol (PROVENTIL HFA;VENTOLIN HFA) 108 (90 Base) MCG/ACT inhaler Inhale 2 puffs into the lungs every 4 (four) hours as needed for wheezing or shortness of breath. Patient not taking: Reported on 05/17/2019 09/18/18   Hennie Duos, MD  calcium elemental as carbonate (TUMS ULTRA 1000) 400 MG chewable tablet Chew 3 tablets (1,200 mg total) by mouth 2 times daily at 12 noon and 4 pm. Patient not taking:  Reported on 05/17/2019 09/18/18   Hennie Duos, MD  triamcinolone ointment (KENALOG) 0.5 % Apply 1 application topically 4 (four) times daily. As needed for rash. Patient not taking: Reported on 05/06/2019 03/22/19   Renato Shin, MD     Family History  Problem Relation Age of Onset   Hypercalcemia Neg Hx    Osteoporosis Neg Hx     Social History   Socioeconomic History   Marital status: Widowed    Spouse name: Not on file   Number of children: Not on file   Years of education: Not on file   Highest education level: Not on file  Occupational History   Not on file  Social Needs   Financial resource strain: Not on file   Food insecurity    Worry: Not on file    Inability: Not on file   Transportation needs    Medical: Not on file    Non-medical: Not on file  Tobacco Use   Smoking status: Current Every Day Smoker    Packs/day: 0.70    Years: 60.00    Pack years: 42.00    Types: Cigarettes   Smokeless tobacco: Never Used  Substance and Sexual Activity   Alcohol use: No    Alcohol/week: 0.0 standard drinks   Drug use: No   Sexual activity: Not on file  Lifestyle   Physical activity    Days per week: Not on file    Minutes per session: Not on file   Stress: Not on file  Relationships   Social connections    Talks on phone: Not on file    Gets together: Not on file    Attends religious service: Not on file    Active member of club or organization: Not on file    Attends meetings of clubs or organizations: Not on file    Relationship status: Not on file  Other Topics Concern   Not on file  Social History Narrative   Marital status: widowed 28 years ago.  From [place]; moved to [place] 1974.     Children: none       Lives:  Alone; brother in law is Psychologist, sport and exercise who is 71.      Employment: retired.       Tobacco:  1/2 ppd       Alcohol:  None      Exercise:  Walking daily.      ADLs: no driving.     Review of Systems: A 12 point ROS discussed and  pertinent positives are indicated in the HPI above.  All other systems are negative.  Review of Systems  Constitutional: Positive for activity change and fatigue.  Cardiovascular: Negative for chest pain.  Gastrointestinal: Negative for abdominal pain.  Neurological: Positive for weakness.  Psychiatric/Behavioral: Positive for confusion. Negative for behavioral problems.    Vital Signs: BP 117/61 (BP Location: Left Arm)    Pulse 98    Temp 97.7 F (36.5 C) (Oral)    Resp 16    Ht 5' (1.524 m)    Wt  110 lb (49.9 kg)    SpO2 94%    BMI 21.48 kg/m   Physical Exam Vitals signs reviewed.  Neck:     Comments: Bilateral neck masses; NT Cardiovascular:     Rate and Rhythm: Normal rate and regular rhythm.     Heart sounds: Normal heart sounds.  Pulmonary:     Breath sounds: Normal breath sounds.  Abdominal:     Tenderness: There is no abdominal tenderness.  Skin:    General: Skin is warm and dry.  Neurological:     Mental Status: She is alert.     Comments: Spoke to pt through [employer] machine in [place] She was unable to answer many questions correctly She was able to say name and day/month of birthday--- not yr Not oriented to place   Psychiatric:     Comments: Consent obtained from Dr Zettie Cooley     Imaging: Dg Shoulder Right  Result Date: 05/17/2019 CLINICAL DATA:  85 year old female with acute pain, non-English-speaking. EXAM: RIGHT SHOULDER - 2+ VIEW COMPARISON:  Chest radiographs 07/20/2018 and earlier. FINDINGS: Comminuted fracture through the greater tuberosity of the right humerus. Mildly displaced fragments. No associated glenohumeral joint dislocation. The humeral neck appears intact. The right clavicle and scapula appear intact. There are chronic right lateral rib fractures. Stable visible right lung. Chronic calcified mediastinal lymph nodes. There are thoracic spine compression fractures which are new or increased since 2019 (especially in the lower spine). IMPRESSION:  1. Comminuted fracture of the greater tuberosity of the right humerus. No glenohumeral joint dislocation. 2. Thoracic spine compression fractures are new or increased since 2019. Chronic right rib fractures. Electronically Signed   By: Genevie Ann M.D.   On: 05/17/2019 16:14   Ct Head Wo Contrast  Addendum Date: 05/18/2019   ADDENDUM REPORT: 05/18/2019 14:42 ADDENDUM: After further review, bilateral soft tissue masses in the upper neck are stable. Right-sided soft tissue mass with punctate calcification deep to the sternocleidomastoid muscle measures 20 x 37 mm. Soft tissue mass in the region of the parotid tail on the left unchanged measuring 18 x 19 mm. Electronically Signed   By: Franchot Gallo M.D.   On: 05/18/2019 14:42   Result Date: 05/18/2019 CLINICAL DATA:  Fall.  Head injury EXAM: CT HEAD WITHOUT CONTRAST CT CERVICAL SPINE WITHOUT CONTRAST TECHNIQUE: Multidetector CT imaging of the head and cervical spine was performed following the standard protocol without intravenous contrast. Multiplanar CT image reconstructions of the cervical spine were also generated. COMPARISON:  CT 08/18/2018 FINDINGS: CT HEAD FINDINGS Brain: Moderate atrophy. Chronic microvascular ischemic changes in the white matter left greater than right. Chronic ischemia left internal capsule unchanged. Negative for acute infarct, hemorrhage, mass. No midline shift. Vascular: Negative for hyperdense vessel. Atherosclerotic calcification cavernous carotid bilaterally. Skull: Negative for fracture Sinuses/Orbits: Negative Other: None CT CERVICAL SPINE FINDINGS Alignment: 4 mm retrolisthesis C5-6 Skull base and vertebrae: Negative for fracture Soft tissues and spinal canal: Atherosclerotic calcification carotid artery bilaterally. No soft tissue mass Disc levels: Moderately large central disc protrusion at C3-4 causing spinal stenosis. Advanced facet degeneration on the left at C4-5 causing left foraminal encroachment and mild spinal stenosis.  Moderate to severe spinal stenosis at C5-6 due to retrolisthesis and advanced spondylosis as well as bilateral facet degeneration. Right foraminal disc protrusion at C6-7. Upper chest: Negative Other: None IMPRESSION: 1. No acute intracranial abnormality. Atrophy with chronic microvascular ischemia 2. Negative for cervical spine fracture 3. Advanced cervical spondylosis most severe at C5-6 with moderate  to severe spinal stenosis. Electronically Signed: By: Franchot Gallo M.D. On: 05/17/2019 19:35   Ct Cervical Spine Wo Contrast  Addendum Date: 05/18/2019   ADDENDUM REPORT: 05/18/2019 14:42 ADDENDUM: After further review, bilateral soft tissue masses in the upper neck are stable. Right-sided soft tissue mass with punctate calcification deep to the sternocleidomastoid muscle measures 20 x 37 mm. Soft tissue mass in the region of the parotid tail on the left unchanged measuring 18 x 19 mm. Electronically Signed   By: Franchot Gallo M.D.   On: 05/18/2019 14:42   Result Date: 05/18/2019 CLINICAL DATA:  Fall.  Head injury EXAM: CT HEAD WITHOUT CONTRAST CT CERVICAL SPINE WITHOUT CONTRAST TECHNIQUE: Multidetector CT imaging of the head and cervical spine was performed following the standard protocol without intravenous contrast. Multiplanar CT image reconstructions of the cervical spine were also generated. COMPARISON:  CT 08/18/2018 FINDINGS: CT HEAD FINDINGS Brain: Moderate atrophy. Chronic microvascular ischemic changes in the white matter left greater than right. Chronic ischemia left internal capsule unchanged. Negative for acute infarct, hemorrhage, mass. No midline shift. Vascular: Negative for hyperdense vessel. Atherosclerotic calcification cavernous carotid bilaterally. Skull: Negative for fracture Sinuses/Orbits: Negative Other: None CT CERVICAL SPINE FINDINGS Alignment: 4 mm retrolisthesis C5-6 Skull base and vertebrae: Negative for fracture Soft tissues and spinal canal: Atherosclerotic calcification  carotid artery bilaterally. No soft tissue mass Disc levels: Moderately large central disc protrusion at C3-4 causing spinal stenosis. Advanced facet degeneration on the left at C4-5 causing left foraminal encroachment and mild spinal stenosis. Moderate to severe spinal stenosis at C5-6 due to retrolisthesis and advanced spondylosis as well as bilateral facet degeneration. Right foraminal disc protrusion at C6-7. Upper chest: Negative Other: None IMPRESSION: 1. No acute intracranial abnormality. Atrophy with chronic microvascular ischemia 2. Negative for cervical spine fracture 3. Advanced cervical spondylosis most severe at C5-6 with moderate to severe spinal stenosis. Electronically Signed: By: Franchot Gallo M.D. On: 05/17/2019 19:35   Ct Pelvis Wo Contrast  Result Date: 05/17/2019 CLINICAL DATA:  Fall, pelvic pain EXAM: CT PELVIS WITHOUT CONTRAST TECHNIQUE: Multidetector CT imaging of the pelvis was performed following the standard protocol without intravenous contrast. COMPARISON:  Right hip/pelvic radiographs dated 05/17/2019 FINDINGS: Urinary Tract:  Bladder is within normal limits. Bowel:  Visualized bowel is unremarkable. Vascular/Lymphatic: Atherosclerotic calcifications of the infrarenal abdominal aorta and bilateral iliac vessels. No evidence of lymphadenopathy. Reproductive:  Uterus is within normal limits. Bilateral ovaries are within normal limits. Other:  No pelvic ascites. Musculoskeletal: No evidence of fracture. Specifically, the bilateral hips and pelvis are intact. IMPRESSION: No evidence of pelvic fracture. Electronically Signed   By: Julian Hy M.D.   On: 05/17/2019 19:52   Mr Brain Wo Contrast  Result Date: 05/23/2019 CLINICAL DATA:  Unexplained altered level of consciousness. EXAM: MRI HEAD WITHOUT CONTRAST TECHNIQUE: Multiplanar, multiecho pulse sequences of the brain and surrounding structures were obtained without intravenous contrast. COMPARISON:  CT head 05/17/2019.  FINDINGS: The patient was unable to remain motionless for the exam. Small or subtle lesions could be overlooked. Some sequences were truncated due to lack of patient cooperation. The study is of marginal diagnostic utility. Brain: No definite restricted diffusion, although even this assessment is limited. No midline shift or definite mass. Advanced atrophy with white matter FLAIR hyperintensity, likely small vessel disease. There is a remote LEFT basal ganglia infarct. Vascular: Not assessed. Skull and upper cervical spine: Sagittal midline scans could not be obtained. Sinuses/Orbits: No definite sinus opacity or orbital mass. Other: Soft  tissue masses in the neck, described on recent CT cervical spine, are incompletely visualized. IMPRESSION: Marginally diagnostic examination demonstrating no definite mass or acute infarct. Assessment however is limited. Electronically Signed   By: Staci Righter M.D.   On: 05/23/2019 14:44   Ct Shoulder Right Wo Contrast  Result Date: 05/18/2019 CLINICAL DATA:  Proximal humerus fracture secondary to multiple recent falls. EXAM: CT OF THE UPPER RIGHT EXTREMITY WITHOUT CONTRAST TECHNIQUE: Multidetector CT imaging of the upper right extremity was performed according to the standard protocol. COMPARISON:  Radiographs dated 05/17/2019 FINDINGS: Bones/Joint/Cartilage There is an acute comminuted minimally distracted fracture of the greater tuberosity of the right humeral head. There is also what appears to be an old fracture of the anterior rim of the glenoid partial healing. No dislocation. Old healed fractures of the right ribs. Old nonunion fracture of the upper sternum. Old compression fractures of the thoracic spine. Muscles and Tendons There is focal dense calcification in the distal infraspinatus tendon consistent with chronic calcific tendinopathy of the infraspinatus. No atrophy of the muscles of the rotator cuff. Soft tissues Negative. IMPRESSION: 1. Acute comminuted  minimally distracted fracture of the greater tuberosity of the right humeral head. 2. Probable old fracture of the anterior rim of the glenoid. 3. Chronic calcific tendinopathy of the distal infraspinatus tendon. Electronically Signed   By: Lorriane Shire M.D.   On: 05/18/2019 12:43   Mr Foot Right Wo Contrast  Result Date: 05/21/2019 CLINICAL DATA:  Generalized foot and ankle pain. EXAM: MRI OF THE RIGHT ANKLE/FOOT WITHOUT CONTRAST TECHNIQUE: Multiplanar, multisequence MR imaging of the ankle was performed. No intravenous contrast was administered. COMPARISON:  Radiographs 05/20/2019 FINDINGS: TENDONS Peroneal: Intact Posteromedial: Intact Anterior: Intact Achilles: Moderate distal Achilles tendinopathy with small interstitial tears. Plantar Fascia: Intact LIGAMENTS Lateral: Intact Medial: Intact CARTILAGE Ankle Joint: Moderate degenerative chondrosis but no full-thickness cartilage defect or osteochondral lesion. No joint effusion. Subtalar Joints/Sinus Tarsi: Moderate subtalar joint degenerative changes with small joint effusions. There is also moderate fluid in the sinus tarsi but the cervical and interosseous ligaments are intact. Bones: Patchy areas of marrow edema could be due to aggressive osteoporosis. No worrisome bone lesions. Probable hemangioma noted in the distal fibula. Other: Non-specific moderate edema like signal abnormality in the short flexor muscles of the foot. Nonspecific myositis which could be inflammatory or neurogenic. IMPRESSION: 1. Moderate degenerative changes but no stress fracture or osteochondral lesion. 2. Nonspecific myositis in the short flexor muscles of the foot. 3. Distal Achilles tendinopathy. Electronically Signed   By: Marijo Sanes M.D.   On: 05/21/2019 20:28   Dg Knee Complete 4 Views Left  Result Date: 05/18/2019 CLINICAL DATA:  Bilateral knee pain EXAM: LEFT KNEE - COMPLETE 4+ VIEW COMPARISON:  None. FINDINGS: No evidence of fracture, dislocation, or joint  effusion. No evidence of arthropathy or other focal bone abnormality. Soft tissues are unremarkable except for arterial calcification. IMPRESSION: No bone or joint abnormality seen. No effusion. No abnormality seen to explain pain. Electronically Signed   By: Nelson Chimes M.D.   On: 05/18/2019 15:57   Dg Knee Complete 4 Views Right  Result Date: 05/18/2019 CLINICAL DATA:  Knee pain EXAM: RIGHT KNEE - COMPLETE 4+ VIEW COMPARISON:  None. FINDINGS: No evidence of fracture, dislocation, or joint effusion. No evidence of arthropathy or other focal bone abnormality. Soft tissues are unremarkable except for arterial calcification. IMPRESSION: Normal exam. No abnormality seen to explain pain. Age related arterial calcification. Electronically Signed   By: Elta Guadeloupe  Shogry M.D.   On: 05/18/2019 15:57   Dg Foot 2 Views Left  Result Date: 05/20/2019 CLINICAL DATA:  Bilateral foot tenderness. EXAM: LEFT FOOT - 2 VIEW COMPARISON:  None. FINDINGS: There is no evidence of fracture or dislocation. No erosion or notable spurring. Mild hallux valgus. Generalized osteopenia. IMPRESSION: No acute or erosive finding. Generalized osteopenia. Electronically Signed   By: Monte Fantasia M.D.   On: 05/20/2019 10:41   Dg Foot 2 Views Right  Result Date: 05/20/2019 CLINICAL DATA:  Bilateral foot tenderness EXAM: RIGHT FOOT - 2 VIEW COMPARISON:  None. FINDINGS: There is no evidence of fracture or dislocation. There is no evidence of arthropathy or other focal bone abnormality. Generalized osteopenia. IMPRESSION: No acute finding. Generalized osteopenia. Electronically Signed   By: Monte Fantasia M.D.   On: 05/20/2019 10:40   Dg Swallowing Func-speech Pathology  Result Date: 05/18/2019 Objective Swallowing Evaluation: Type of Study: MBS-Modified Barium Swallow Study  Patient Details Name: Barbara Dennis MRN: [id number] Date of Birth: 09/02/1934 Today's Date: 05/18/2019 Time: SLP Start Time (ACUTE ONLY): 1317 -SLP Stop Time (ACUTE  ONLY): 1336 SLP Time Calculation (min) (ACUTE ONLY): 19 min Past Medical History: Past Medical History: Diagnosis Date  Anemia of chronic disease   GERD (gastroesophageal reflux disease)   Hypertension   Mass of right side of neck   Urinary incontinence  Past Surgical History: Past Surgical History: Procedure Laterality Date  APPENDECTOMY    BIOPSY  08/24/2018  Procedure: BIOPSY;  Surgeon: Thornton Park, MD;  Location: [facility];  Service: Gastroenterology;;  ESOPHAGOGASTRODUODENOSCOPY (EGD) WITH PROPOFOL N/A 08/24/2018  Procedure: ESOPHAGOGASTRODUODENOSCOPY (EGD) WITH PROPOFOL;  Surgeon: Thornton Park, MD;  Location: [facility];  Service: Gastroenterology;  Laterality: N/A; HPI: Pt is an 85 y.o. female who was admitted 7/27 with AMS and falls, found to have severe hyponatremia with Na 109 requiring hypertonic saline. PMH significant for urinary incontinence, HTN , right neck mass (followed by serial CT scans, no biopsy), GERD and anemia. Per PCCM note, pt's friend shares that the pt has been having trouble swallowing, coughing and spitting up a lot of the food that she eats.  Subjective: pt denies trouble swallowing, says she does not bring food back up, but does say she coughs when she eats and drinks Assessment / Plan / Recommendation CHL IP CLINICAL IMPRESSIONS 05/18/2019 Clinical Impression Pt has a moderate oral dysphagia with suspected primary esophageal component. She has oral holding, rocking boluses back and forth on her tongue and needing Min cues to initiate full posterior transit. Mastication is prolonged, and she needs encouragement to make attempts. Taking small bites, she still needs to take brief breaks from chewing. Her pharyngeal swallow is triggered in a timely manner, with good airway protection and clearance. She had what appeared to be residual barium in the esophagus with retrograde flow, although MD was not present to confirm. Recommend starting with Dys 1 diet and thin  liquids. Will f/u for potential to advance solids further as her overall strength and endurance improve. ([employer] interpreter used throughout study). SLP Visit Diagnosis Dysphagia, oral phase (R13.11) Attention and concentration deficit following -- Frontal lobe and executive function deficit following -- Impact on safety and function Mild aspiration risk   CHL IP TREATMENT RECOMMENDATION 05/18/2019 Treatment Recommendations Therapy as outlined in treatment plan below   Prognosis 05/18/2019 Prognosis for Safe Diet Advancement Fair Barriers to Reach Goals Cognitive deficits;Other (Comment) Barriers/Prognosis Comment -- CHL IP DIET RECOMMENDATION 05/18/2019 SLP Diet Recommendations Dysphagia 1 (Puree) solids;Thin  liquid Liquid Administration via Cup;Straw Medication Administration Whole meds with liquid Compensations Slow rate;Small sips/bites;Follow solids with liquid Postural Changes Seated upright at 90 degrees;Remain semi-upright after after feeds/meals (Comment)   CHL IP OTHER RECOMMENDATIONS 05/18/2019 Recommended Consults Consider esophageal assessment Oral Care Recommendations Oral care BID Other Recommendations --   CHL IP FOLLOW UP RECOMMENDATIONS 05/18/2019 Follow up Recommendations 24 hour supervision/assistance   CHL IP FREQUENCY AND DURATION 05/18/2019 Speech Therapy Frequency (ACUTE ONLY) min 2x/week Treatment Duration 2 weeks      CHL IP ORAL PHASE 05/18/2019 Oral Phase Impaired Oral - Pudding Teaspoon -- Oral - Pudding Cup -- Oral - Honey Teaspoon -- Oral - Honey Cup -- Oral - Nectar Teaspoon -- Oral - Nectar Cup -- Oral - Nectar Straw -- Oral - Thin Teaspoon -- Oral - Thin Cup Holding of bolus;Reduced posterior propulsion Oral - Thin Straw Holding of bolus;Reduced posterior propulsion Oral - Puree Holding of bolus;Reduced posterior propulsion Oral - Mech Soft Holding of bolus;Reduced posterior propulsion;Impaired mastication Oral - Regular -- Oral - Multi-Consistency -- Oral - Pill Holding of bolus;Reduced  posterior propulsion Oral Phase - Comment --  CHL IP PHARYNGEAL PHASE 05/18/2019 Pharyngeal Phase WFL Pharyngeal- Pudding Teaspoon -- Pharyngeal -- Pharyngeal- Pudding Cup -- Pharyngeal -- Pharyngeal- Honey Teaspoon -- Pharyngeal -- Pharyngeal- Honey Cup -- Pharyngeal -- Pharyngeal- Nectar Teaspoon -- Pharyngeal -- Pharyngeal- Nectar Cup -- Pharyngeal -- Pharyngeal- Nectar Straw -- Pharyngeal -- Pharyngeal- Thin Teaspoon -- Pharyngeal -- Pharyngeal- Thin Cup -- Pharyngeal -- Pharyngeal- Thin Straw -- Pharyngeal -- Pharyngeal- Puree -- Pharyngeal -- Pharyngeal- Mechanical Soft -- Pharyngeal -- Pharyngeal- Regular -- Pharyngeal -- Pharyngeal- Multi-consistency -- Pharyngeal -- Pharyngeal- Pill -- Pharyngeal -- Pharyngeal Comment --  CHL IP CERVICAL ESOPHAGEAL PHASE 05/18/2019 Cervical Esophageal Phase WFL Pudding Teaspoon -- Pudding Cup -- Honey Teaspoon -- Honey Cup -- Nectar Teaspoon -- Nectar Cup -- Nectar Straw -- Thin Teaspoon -- Thin Cup -- Thin Straw -- Puree -- Mechanical Soft -- Regular -- Multi-consistency -- Pill -- Cervical Esophageal Comment -- Venita Sheffield Nix 05/18/2019, 2:08 PM  Pollyann Glen, M.A. CCC-SLP Acute Rehabilitation Services Pager [phone] Office [phone]             Dg Hip Malvin Johns Or Wo Pelvis 2-3 Views Right  Result Date: 05/17/2019 CLINICAL DATA:  85 year old female with right hip pain. Non-English-speaking. EXAM: DG HIP (WITH OR WITHOUT PELVIS) 2-3V RIGHT COMPARISON:  CT Abdomen and Pelvis 07/14/2018. Hip radiographs 12/17/2018. FINDINGS: Extensive iliofemoral calcified atherosclerosis. Numerous phleboliths in the pelvis. The pelvis appears stable and intact. Femoral heads are normally located. Hip joint spaces are stable. Grossly intact proximal left femur. The proximal right femur appears intact. Stable visible bowel gas pattern. IMPRESSION: No acute osseous abnormality identified about the right hip or pelvis. Electronically Signed   By: Genevie Ann M.D.   On: 05/17/2019 16:17   Dg  Esophagus W Single Cm (sol Or Thin Ba)  Result Date: 05/19/2019 CLINICAL DATA:  85 year old female with history of dysphagia. Coughing with meals, spitting up food intermittently. EXAM: ESOPHOGRAM/BARIUM SWALLOW TECHNIQUE: Single contrast examination was performed using  thin barium. FLUOROSCOPY TIME:  Fluoroscopy Time:  54 seconds Radiation Exposure Index (if provided by the fluoroscopic device): 4.2 mGy Number of Acquired Spot Images: 0 COMPARISON:  None. FINDINGS: Limited single contrast imaging of the esophagus demonstrated intermittent failure to propagate primary peristaltic waves. Multiple tertiary contractions were noted. No definite esophageal mass, stricture, ring or hiatal hernia. Barium passed through the gastroesophageal junction readily  into the stomach. A barium tablet was administered, but the patient was unable to ingest the tablet. IMPRESSION: 1. Nonspecific esophageal motility disorder with extensive tertiary contractions. Electronically Signed   By: Vinnie Langton M.D.   On: 05/19/2019 15:22    Labs:  CBC: Recent Labs    05/18/19 0236 05/19/19 0358 05/20/19 0519 05/24/19 0225  WBC 9.4 6.7 8.0 8.4  HGB 9.7* 9.0* 9.8* 9.7*  HCT 27.1* 25.2* 27.6* 28.2*  PLT 290 261 285 291    COAGS: Recent Labs    05/24/19 0225  INR 1.0    BMP: Recent Labs    05/21/19 0257 05/22/19 0240 05/23/19 0347 05/24/19 0225  NA 127* 126* 131* 129*  K 4.0 4.1 4.0 4.8  CL 94* 92* 96* 98  CO2 25 22 26 23   GLUCOSE 122* 117* 130* 125*  BUN 12 21 36* 36*  CALCIUM 9.1 9.3 9.3 9.1  CREATININE 0.65 0.73 0.82 0.76  GFRNONAA >60 >60 >60 >60  GFRAA >60 >60 >60 >60    LIVER FUNCTION TESTS: Recent Labs    07/19/18 1629 05/17/19 1655 05/19/19 0358  BILITOT 1.1 1.0 0.7  AST 21 21 14*  ALT 17 19 16   ALKPHOS 110 119 92  PROT 7.7 7.4 5.4*  ALBUMIN 3.6 3.6 2.6*    TUMOR MARKERS: No results for input(s): AFPTM, CEA, CA199, CHROMGRNA in the last 8760 hours.  Assessment and  Plan:  Hyponatremia Frequent falls Confusion Weakness Bilat neck masses Scheduled for neck masses biopsy in am Dr Zettie Cooley consented for pt Consent in IR  Thank you for this interesting consult.  I greatly enjoyed meeting DALIAH CHAUDOIN and look forward to participating in their care.  A copy of this report was sent to the requesting provider on this date.  Electronically Signed: Lavonia Drafts, PA-C 05/24/2019, 2:18 PM   I spent a total of 20 Minutes    in face to face in clinical consultation, greater than 50% of which was counseling/coordinating care for Bilat neck mass bx

## 2019-05-25 ENCOUNTER — Inpatient Hospital Stay (HOSPITAL_COMMUNITY): Payer: Medicare Other

## 2019-05-25 LAB — COMPREHENSIVE METABOLIC PANEL
ALT: 25 U/L (ref 0–44)
AST: 20 U/L (ref 15–41)
Albumin: 3.4 g/dL — ABNORMAL LOW (ref 3.5–5.0)
Alkaline Phosphatase: 92 U/L (ref 38–126)
Anion gap: 11 (ref 5–15)
BUN: 32 mg/dL — ABNORMAL HIGH (ref 8–23)
CO2: 23 mmol/L (ref 22–32)
Calcium: 9.5 mg/dL (ref 8.9–10.3)
Chloride: 96 mmol/L — ABNORMAL LOW (ref 98–111)
Creatinine, Ser: 0.81 mg/dL (ref 0.44–1.00)
GFR calc Af Amer: >60 mL/min (ref >60)
GFR calc non Af Amer: >60 mL/min (ref >60)
Glucose, Bld: 125 mg/dL — ABNORMAL HIGH (ref 70–99)
Potassium: 4.7 mmol/L (ref 3.5–5.1)
Sodium: 130 mmol/L — ABNORMAL LOW (ref 135–145)
Total Bilirubin: 0.6 mg/dL (ref 0.3–1.2)
Total Protein: 6.9 g/dL (ref 6.5–8.1)

## 2019-05-25 LAB — T3, FREE: T3, Free: 2.9 pg/mL (ref 2.0–4.4)

## 2019-05-25 LAB — CK: Total CK: 27 U/L — ABNORMAL LOW (ref 38–234)

## 2019-05-25 LAB — SEDIMENTATION RATE: Sed Rate: 56 mm/hr — ABNORMAL HIGH (ref 0–22)

## 2019-05-25 LAB — C-REACTIVE PROTEIN: CRP: <0.8 mg/dL (ref <1.0)

## 2019-05-25 LAB — LACTATE DEHYDROGENASE: LDH: 111 U/L (ref 98–192)

## 2019-05-25 MED ORDER — SENNA 8.6 MG PO TABS: 1.0000 | ORAL_TABLET | Freq: Every day | ORAL | 0 refills | Status: AC | PRN

## 2019-05-25 MED ORDER — ENOXAPARIN SODIUM 40 MG/0.4ML ~~LOC~~ SOLN
40.0000 mg | SUBCUTANEOUS | Status: DC
  Administered 2019-05-25 – 2019-05-26 (×2): 40 mg via SUBCUTANEOUS
  Filled 2019-05-25 (×4): qty 0.4

## 2019-05-25 MED ORDER — AMLODIPINE BESYLATE 10 MG PO TABS: 10.0000 mg | ORAL_TABLET | Freq: Every day | ORAL | Status: AC

## 2019-05-25 MED ORDER — LIDOCAINE-EPINEPHRINE 1 %-1:100000 IJ SOLN
INTRAMUSCULAR | Status: AC
  Filled 2019-05-25: qty 1

## 2019-05-25 MED ORDER — MIDAZOLAM HCL 2 MG/2ML IJ SOLN
INTRAMUSCULAR | Status: AC | PRN
  Administered 2019-05-25: 1 mg via INTRAVENOUS
  Administered 2019-05-25: 0.5 mg via INTRAVENOUS

## 2019-05-25 MED ORDER — FENTANYL CITRATE (PF) 100 MCG/2ML IJ SOLN
INTRAMUSCULAR | Status: AC | PRN
  Administered 2019-05-25: 25 ug via INTRAVENOUS
  Administered 2019-05-25: 50 ug via INTRAVENOUS

## 2019-05-25 MED ORDER — MIDAZOLAM HCL 2 MG/2ML IJ SOLN
INTRAMUSCULAR | Status: AC | PRN
  Administered 2019-05-25: 0.5 mg via INTRAVENOUS

## 2019-05-25 MED ORDER — ACETAMINOPHEN 325 MG PO TABS: 650.0000 mg | ORAL_TABLET | Freq: Four times a day (QID) | ORAL | Status: AC | PRN

## 2019-05-25 MED ORDER — MIDAZOLAM HCL 2 MG/2ML IJ SOLN
INTRAMUSCULAR | Status: AC
  Filled 2019-05-25: qty 2

## 2019-05-25 MED ORDER — FENTANYL CITRATE (PF) 100 MCG/2ML IJ SOLN
INTRAMUSCULAR | Status: AC | PRN
  Administered 2019-05-25: 25 ug via INTRAVENOUS

## 2019-05-25 MED ORDER — FENTANYL CITRATE (PF) 100 MCG/2ML IJ SOLN
INTRAMUSCULAR | Status: AC
  Filled 2019-05-25: qty 2

## 2019-05-25 MED ORDER — RAMELTEON 8 MG PO TABS: 8.0000 mg | ORAL_TABLET | Freq: Every day | ORAL | Status: AC

## 2019-05-25 MED ORDER — POLYETHYLENE GLYCOL 3350 17 G PO PACK: 17.0000 g | PACK | Freq: Every day | ORAL | 0 refills | Status: AC

## 2019-05-25 NOTE — Discharge Summary (Addendum)
[facility] Discharge Summary  Patient name: Barbara Dennis Medical record number: [id number] Date of birth: 1934-10-10 Age: 85 y.o. Gender: female Date of Admission: 05/17/2019  Date of Discharge: 05/29/19    Admitting Physician: Kandice Hams, MD  Primary Care Provider: Horald Pollen, MD Consultants: Carin Hock, CCM  Indication for Hospitalization: AMS and hyponatremia  Discharge Diagnoses/Problem List:  Active Problems:   Foot pain, bilateral   Altered mental status   Knee pain, left   Multiple masses of neck  Disposition: SNF  Discharge Condition: Stable  Discharge Exam:  BP 129/64 (BP Location: Left Arm)   Pulse 88   Temp 98.3 F (36.8 C) (Oral)   Resp 18   Ht 5' (1.524 m)   Wt 50.3 kg   SpO2 97%   BMI 21.68 kg/m  Per Dr. Posey Pronto on day of discharge: General: NAD, non-toxic, elderly korean woman, sleeping in bed, sitting up.  HEENT: [place]/AT. PERRLA. EOMI.  Cardiovascular: RRR, normal S1, S2. B/L 2+ RP. N BLEE Respiratory: CTAB. No IWOB.  Abdomen: + BS. NT, ND, soft to palpation.  Extremities: Warm and well perfused. Limits right leg movement. Mild tenderness of anterior quad and calf muscles.  She has good active range of motion in her lower extremities at knees and hips, greatly improved pain with dorsiflexion of right foot. Can flex knees and hips in bed.  Integumentary: No obvious rashes, lesions, trauma on general exam with exception of right dorsum eccymosis. Neuro: A & O to self and place today   Brief Hospital Course:  Barbara Dennis is a 85 y.o. female with past medical history significant for hypertension, urinary incontinence, GERD, anemia who was admitted to the ICU on admission for hyponatremia of 109.  Patient also presented with altered mental status, recent dysphasia and pain in her legs.  For her hyponatremia, she was treated with hypertonic saline and then transition to normal saline. She was transferred to the  floor as her sodium improved. Her sodium was stable over the last few days of her admission (at her baseline, high 120's-low 130's).  Her discharge sodium was 130.  It is very likely that patient's altered mental status was due to the hyponatremia, given that she slowly continue to improve after this was corrected.  Additionally, MRI head and CT scan were obtained and showed no acute abnormalities.  It is thought that the combination of laxatives for constipation, decreased p.o. intake, lisinopril may have all contributed to hyponatremia.  Patient was discontinued on oxybutynin and lisinopril prior to discharge.  Right foot pain While in the ICU, patient also complained of leg pain.  Due to her altered mental status and communication barriers, it was difficult to determine where and what her leg pain was.  She was a very poor historian at the time.  When she was out of the ICU, she was more alert and able to identify where her legs hurt.  She had an ecchymosis on the dorsal part of her right foot and on exam she was tender to dorsiflexion of the right foot.  Bilateral knee radiographs and right ankle radiograph were obtained and all were negative.  We also obtained MRI foot in the setting of the ecchymosis and tenderness to palpation to rule out fracture and that came back negative except for nonspecific inflammatory myositis in her flexor muscles.  Patient continue to work with PT OT while she was admitted and they recommended that she go to skilled  nursing facility.  Patient's family also agreed with skilled nursing facility, as patient's daughter would be unable to take care of her full-time at home, especially since her husband continues to work.  Patient was discharged to skilled nursing facility for further rehabilitation and hopes to return home with her family.  Patient was initially hesitant to work with PT and OT due to the pain in her foot.  She was encouraged to continue to work with them as much as  possible as this would help her get home faster from the skilled nursing facility despite pain.  She worked well with PT and OT prior to discharge.  Ultimately, inflammatory markers were obtained for the myositis in patient's foot as it was not improving.  Results returned with positive ANA and elevated sed rate at 56.  CRP was within normal limits and myoglobin within normal limits.  TSH was mildly low and T4 elevated at 1.47.  Patient was started on on 20 mg prednisone burst for 5 days for inflammation to help with pain just prior to discharge.  Frequent falls Per daughter, patient was having frequent falls at home, and had more falls just prior to admission.  On arrival, patient was found to have a right comminuted fracture of her humerus.  Orthopedic surgery was consulted and recommended no further management and for patient to wear sling.  Other imaging included CT spine with the CT head and CT pelvis to ensure no other abnormalities for falls.  CT pelvis was negative and CT spine showed an already known severe stenosis and cervical spinal lysis.  Hypertension Patient with history of hypertension.  Prior to admission, patient was taking amlodipine 5 mg twice daily as well as lisinopril.  Lisinopril was discontinued as it can cause hyponatremia.  Ultimately her amlodipine was increased to 10 mg twice daily and she responded very well to this.  Bilateral neck masses Prior to admission, patient had been seen with Dr. Solon Augusta a neck mass that was seen in 2015 during a thyroid procedure which was just followed with surveillance.  Patient was hospitalized in December 2019 and the mass appeared stable.  There was another mass identified on the left parotid area as well.  Patient was referred to Dr. Lorelee Cover and he recommended bilateral biopsies.  They were unable to make this appointment prior to admission and IR was contacted for possible biopsy during admission.  This was successfully accomplished on 05/25/2019 by  ultrasound.  The results are pending.  Dysphasia Patient also presented with recent history of dysphasia, where she was unable to swallow her food very well.  Her dysphasia improved with speech.  Speech and language pathology assessed that she had difficulty with the oral phase and recommended further testing.  An esophagram was obtained that showed some nonspecific esophageal motility disorder.  There was no further for work-up as patient tolerated a dysphagia 1 diet and had good p.o. intake without any further issue.  It was thought that dysphasia was likely partly due to patient's hyponatremia.  Issues for Follow Up:  1. Ambulation improvement, pain control of right foot. 2. Needs follow Xrays in ortho office for right shoulder and arm 2 weeks from 7/28 (around 8/11) and begin PT at that time.  3. Ensure patient has physical therapy for shoulder. 4. Patient responded well to ramelteon while inpatient for sleeping.  Did continue ramelteon at discharge for skilled nursing facility.  Can transition to melatonin once discharged as ramelteon is expensive. 5. Likely to continue  to need PT/OT at home after discharge from skilled nursing facility.  Patient may need DME devices for home. 6. To avoid future hyponatremia, decrease risk by ensuring patient is getting good p.o. intake, avoiding laxatives for constipation, and continue discontinuation of lisinopril 7. Follow-up results of neck biopsy 8. Recheck thyroid  9. Recheck Na at f/u.   Significant Procedures:  05/25/19 Bilateral biopsy of right and left neck masses by IR, patient tolerated procedure well without complication   Procedure Orders     Critical Care     ED EKG  Significant Labs and Imaging:  Recent Labs  Lab 05/24/19 0225  WBC 8.4  HGB 9.7*  HCT 28.2*  PLT 291   Recent Labs  Lab 05/23/19 0347 05/24/19 0225 05/25/19 0231  NA 131* 129* 130*  K 4.0 4.8 4.7  CL 96* 98 96*  CO2 26 23 23   GLUCOSE 130* 125* 125*  BUN 36*  36* 32*  CREATININE 0.82 0.76 0.81  CALCIUM 9.3 9.1 9.5  ALKPHOS  --   --  92  AST  --   --  20  ALT  --   --  25  ALBUMIN  --   --  3.4*    RT Shoulder 05/17/19 IMPRESSION: 1. Comminuted fracture of the greater tuberosity of the right humerus. No glenohumeral joint dislocation. 2. Thoracic spine compression fractures are new or increased since 2019. Chronic right rib fractures.   CT Right Shoulder 7/28 1. Acute comminuted minimally distracted fracture of the greater tuberosity of the right humeral head. 2. Probable old fracture of the anterior rim of the glenoid. 3. Chronic calcific tendinopathy of the distal infraspinatus tendon.  Right Hip DG 7/27 No acute osseous abnormality identified about the right hip or pelvis.   CT  Pelvis w/o 7/27  No evidence of pelvic fracture.  B/L Knees 05/18/19  LT No bone or joint abnormality seen. No effusion. No abnormality seen to explain pain. RT Normal exam. No abnormality seen to explain pain. Age related arterial calcification.  CT Head w/o 7/28 1. No acute intracranial abnormality. Atrophy with chronic microvascular ischemia 2. Negative for cervical spine fracture 3. Advanced cervical spondylosis most severe at C5-6 with moderate to severe spinal stenosis.  CT Spine w/o 7/28 Right-sided soft tissue mass with punctate calcification deep to the sternocleidomastoid muscle measures 20 x 37 mm. Soft tissue mass in the region of the parotid tail on the left unchanged measuring 18 x 19 mm.   Esophagram 05/19/19 Dg Esophagus W Single Cm (sol Or Thin Ba) 1. Nonspecific esophageal motility disorder with extensive tertiary contractions.  B/L Foot 05/20/19 LT Foot No acute or erosive finding. Generalized osteopenia. RT Foot No acute finding. Generalized osteopenia.  MRI Right ankle 05/21/29 1. Moderate degenerative changes but no stress fracture or osteochondral lesion. 2. Nonspecific myositis in the short flexor muscles of the foot. 3. Distal Achilles  tendinopathy.   MRI Brain w/o 05/23/19 Marginally diagnostic examination demonstrating no definite mass or acute infarct. Assessment however is limited.   Results/Tests Pending at Time of Discharge:  . B/L mass biopsy  Discharge Medications:  Allergies as of 05/29/2019   No Known Allergies     Medication List    STOP taking these medications   albuterol 108 (90 Base) MCG/ACT inhaler Commonly known as: VENTOLIN HFA   bismuth subsalicylate 509 MG chewable tablet Commonly known as: PEPTO BISMOL   calcium elemental as carbonate 400 MG chewable tablet Commonly known as: Tums Ultra 1000  lisinopril 40 MG tablet Commonly known as: ZESTRIL   oxybutynin 10 MG 24 hr tablet Commonly known as: DITROPAN-XL   triamcinolone ointment 0.5 % Commonly known as: KENALOG     TAKE these medications   acetaminophen 325 MG tablet Commonly known as: TYLENOL Take 325-650 mg by mouth 3 (three) times daily as needed for headache (pain). What changed: Another medication with the same name was changed. Make sure you understand how and when to take each.   acetaminophen 325 MG tablet Commonly known as: TYLENOL Take 2 tablets (650 mg total) by mouth every 6 (six) hours as needed for mild pain, fever or headache. What changed:   medication strength  how much to take  when to take this  reasons to take this   amLODipine 10 MG tablet Commonly known as: NORVASC Take 1 tablet (10 mg total) by mouth daily. What changed:   medication strength  how much to take   Cal-Mag-Zinc-D Tabs Take 1 tablet by mouth daily.   cholecalciferol 25 MCG (1000 UT) tablet Commonly known as: VITAMIN D3 Take 1,000 Units by mouth daily.   ibandronate 150 MG tablet Commonly known as: BONIVA Take 1 tablet (150 mg total) by mouth every 30 (thirty) days.   multivitamin with minerals Tabs tablet Take 1 tablet by mouth daily.   omeprazole 20 MG capsule Commonly known as: PRILOSEC TAKE 2 CAPSULES BY MOUTH  TWICE DAILY BEFORE A MEAL What changed:   how much to take  how to take this  when to take this  additional instructions   OVER THE COUNTER MEDICATION Take 1 tablet by mouth 2 (two) times daily as needed (pain/inflammation). Anti-inflammatory from [place]   polyethylene glycol 17 g packet Commonly known as: MIRALAX / GLYCOLAX Take 17 g by mouth daily.   ramelteon 8 MG tablet Commonly known as: ROZEREM Take 1 tablet (8 mg total) by mouth at bedtime.   senna 8.6 MG Tabs tablet Commonly known as: SENOKOT Take 1 tablet (8.6 mg total) by mouth daily as needed for mild constipation.       Discharge Instructions: Please refer to Patient Instructions section of EMR for full details.  Patient was counseled important signs and symptoms that should prompt return to medical care, changes in medications, dietary instructions, activity restrictions, and follow up appointments.   Follow-Up Appointments: Future Appointments  Date Time Provider [place]  08/09/2019  1:00 PM Renato Shin, MD [facility] None     Alcario Tinkey, [place], DO 05/29/2019, 1:40 PM PGY-3, [place]

## 2019-05-25 NOTE — Procedures (Signed)
Pre Procedure Dx: Indeterminate left parotid nodule Post Procedural Dx: Same  Technically successful US guided FNA of indeterminate nodule within the deep aspect of the left parotid gland.  EBL: Trace No immediate complications.   Ronny Bacon, MD Pager #: [phone]

## 2019-05-25 NOTE — Care Management Important Message (Signed)
Important Message  Patient Details  Name: Barbara Dennis MRN: [id number] Date of Birth: Feb 01, 1934   Medicare Important Message Given:  Yes     Vergia Chea 05/25/2019, 1:17 PM

## 2019-05-25 NOTE — Procedures (Signed)
Pre Procedure Dx: Right sided neck mass Post Procedural Dx: Same  Technically successful US guided biopsy of indeterminate mass within the right side of the neck.  EBL: None No immediate complications.   Ronny Bacon, MD Pager #: [phone]

## 2019-05-25 NOTE — Progress Notes (Signed)
  Speech Language Pathology Treatment: Dysphagia  Patient Details Name: Barbara Dennis MRN: [id number] DOB: 1934-02-18 Today's Date: 05/25/2019 Time: 1600-1630 SLP Time Calculation (min) (ACUTE ONLY): 30 min  Assessment / Plan / Recommendation Clinical Impression  Patient seen to address dysphagia goals with trials of upgraded solids. Interpreter Kyungsun [phone]) from [employer] video interpreting service was utilized for discussion with patient. Of note, patient appears much more lucid and less confused, but continues to seem to get worried when SLP leaves room, "when will you come back?", "help me". This could be from patient's lack of frequent visitation from family. She masticated regular solids with minimal difficulty and did not exhibit any oral residuals. She is requesting [place] style food, such as "fried rice" and when SLP informed her of options that would be similar, she was in agreement (rice, chicken broth/soup, etc). Patient is safe to upgrade from puree to Dys 3 mech soft solids at this time but continues to benefit from assistance with self feeding.   HPI HPI: Pt is an 85 y.o. female who was admitted 7/27 with AMS and falls, found to have severe hyponatremia with Na 109 requiring hypertonic saline. PMH significant for urinary incontinence, HTN , right neck mass (followed by serial CT scans, no biopsy), GERD and anemia. Per PCCM note, pt's friend shares that the pt has been having trouble swallowing, coughing and spitting up a lot of the food that she eats.       SLP Plan  Continue with current plan of care       Recommendations  Diet recommendations: Dysphagia 3 (mechanical soft);Thin liquid Liquids provided via: Cup;Straw Medication Administration: Whole meds with liquid Supervision: Staff to assist with self feeding;Full supervision/cueing for compensatory strategies Compensations: Minimize environmental distractions;Slow rate;Small sips/bites;Lingual sweep for clearance of  pocketing                Oral Care Recommendations: Oral care BID Follow up Recommendations: 24 hour supervision/assistance;Skilled Nursing facility SLP Visit Diagnosis: Dysphagia, oral phase (R13.11) Plan: Continue with current plan of care       GO                Barbara Dennis 05/25/2019, 5:24 PM   [name], MA, [facility] Pager: [phone]

## 2019-05-25 NOTE — NC FL2 (Signed)
[place] LEVEL OF CARE SCREENING TOOL     IDENTIFICATION  Patient Name: Barbara Dennis Birthdate: Oct 06, 1934 Sex: female Admission Date (Current Location): 05/17/2019  [facility] and [place] Number:  Herbalist and Address:  [facility], [place] [address], [place], [place] [address]      Provider Number: [id number]  Attending Physician Name and Address:  Martyn Malay, MD  Relative Name and Phone Number:  Dorthula Perfect [phone]    Current Level of Care: Hospital Recommended Level of Care: [place] Prior Approval Number:    Date Approved/Denied:   PASRR Number: [id number]  Discharge Plan: SNF    Current Diagnoses: Patient Active Problem List   Diagnosis Date Noted  . Altered mental status 05/17/2019  . Acute pain of right shoulder   . Closed displaced fracture of greater tuberosity of right humerus   . Frequent falls 12/17/2018  . Pedal edema 10/21/2018  . Osteoporosis 09/12/2018  . Overactive bladder 08/29/2018  . O2 dependent 08/11/2018  . Hyponatremia 08/02/2018  . Gastroesophageal reflux disease 07/16/2018  . Chronic pain syndrome 01/07/2018  . Chronic obstructive pulmonary disease ([place]) 08/20/2017  . Multinodular goiter 08/15/2017  . Bilateral leg pain 07/15/2017  . Chronic bilateral low back pain without sciatica 07/15/2017  . Essential hypertension, benign 07/15/2017  . Hypercalcemia 07/15/2017  . Depression with anxiety 01/06/2015  . 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 DJD (degenerative joint disease) 02/16/2013  . Tobacco user 09/23/2012  . Menopause 08/21/2012  . Postmenopausal atrophic vaginitis 08/21/2012    Orientation RESPIRATION BLADDER Height & Weight     Self  Normal Incontinent Weight: 110 lb (49.9 kg) Height:  5' (152.4 cm)  BEHAVIORAL SYMPTOMS/MOOD NEUROLOGICAL BOWEL NUTRITION STATUS      Continent Diet(Please see DC Summary)  AMBULATORY STATUS COMMUNICATION OF NEEDS  Skin   Extensive Assist Verbally(Speaks [place]) Surgical wounds(Closed incision on neck)                       Personal Care Assistance Level of Assistance  Bathing, Feeding, Dressing Bathing Assistance: Maximum assistance Feeding assistance: Limited assistance Dressing Assistance: Limited assistance     Functional Limitations Info  Sight, Hearing, Speech Sight Info: Adequate Hearing Info: Adequate Speech Info: Adequate    SPECIAL CARE FACTORS FREQUENCY  PT (By licensed PT), OT (By licensed OT)     PT Frequency: 5x/week OT Frequency: 3x/week            Contractures Contractures Info: Not present    Additional Factors Info  Code Status, Allergies Code Status Info: Full Allergies Info: NKA           Current Medications (05/25/2019):  This is the current hospital active medication list Current Facility-Administered Medications  Medication Dose Route Frequency Provider Last Rate Last Dose  . acetaminophen (TYLENOL) tablet 650 mg  650 mg Oral Q6H PRN Omar Person, NP   650 mg at 05/25/19 1358  . amLODipine (NORVASC) tablet 10 mg  10 mg Oral Daily Wilber Oliphant, MD   10 mg at 05/25/19 1253  . Chlorhexidine Gluconate Cloth 2 % PADS 6 each  6 each Topical Q0600 Omar Person, NP   6 each at 05/24/19 [phone]  . enoxaparin (LOVENOX) injection 40 mg  40 mg Subcutaneous Q24H Wilber Oliphant, MD   40 mg at 05/25/19 1707  . lidocaine (LIDODERM) 5 % 1 patch  1 patch Transdermal QHS Higinio Plan,  Samantha N, DO   1 patch at 05/24/19 2117  . MEDLINE mouth rinse  15 mL Mouth Rinse BID Omar Person, NP   15 mL at 05/25/19 1349  . multivitamin with minerals tablet 1 tablet  1 tablet Oral Daily Martyn Malay, MD   1 tablet at 05/25/19 1245  . pantoprazole (PROTONIX) EC tablet 80 mg  80 mg Oral Daily Wilber Oliphant, MD   80 mg at 05/25/19 1246  . polyethylene glycol (MIRALAX / GLYCOLAX) packet 17 g  17 g Oral Daily Enid Derry, [place], DO   17 g at 05/25/19 1353  . ramelteon  (ROZEREM) tablet 8 mg  8 mg Oral QHS Wilber Oliphant, MD   8 mg at 05/24/19 2117  . senna (SENOKOT) tablet 8.6 mg  1 tablet Oral Daily PRN Shirley, [place], DO         Discharge Medications: Please see discharge summary for a list of discharge medications.  Relevant Imaging Results:  Relevant Lab Results:   Additional Information SSN: [id number]   COVID negative on 7/27  Lissa Morales Erroll Wilbourne, LCSW

## 2019-05-25 NOTE — Sedation Documentation (Signed)
Patient arrived to IR complaining of severe leg pain. This RN assessed leg and noticed patient saturated in urine and shoulder sling not on patient. This RN cleaned patient and changed bed linen, reapplied sling and will pass leg pain on to floor RN for assessment. Updates to be given to floor RN in report

## 2019-05-25 NOTE — Progress Notes (Addendum)
Family Medicine Teaching Service Daily Progress Note Intern Pager: [phone]  Patient name: Barbara Dennis Medical record number: [id number] Date of birth: 1934/01/20 Age: 85 y.o. Gender: female  Primary Care Provider: Horald Pollen, MD Consultants: ortho, IR, PT, OT, SW Code Status: Full  Pt Overview and Major Events to Date:  7/27: admitted for falls, hyponatremia 8/4: Biopsy of neck mass  (parotid likely)   Assessment and Plan: Barbara Dennis is a 85 y.o. female who presented w/ hyponatremia to 109, frequent falls, and acute dysphagia. PMHx s/f ACD, wedge compression fx T11-T12, hx, frequent falls, COPD, tobacco use, severe cervical degeneration and stenosis, age related osteoporosis with right humeral fracture, GERD.   AMS, patient back to baseline After biopsies are finished, patient otherwise stable for discharge to SNF when she has a bed available. I have spoken to patient's family/caretakers, and they also have agreed to placement at a SNF until she can return home.  -- SW consulted for SNF placement.   -- continue ramelteon QHS  -- COVID testing  Right neck mass  Consented patient for surgery yesterday.  Patient is scheduled for a.m. biopsies today. --See additional documentation regarding consent. -- resume diet after procedure   Right foot painand frequent falls Started lab work-up for inflammatory myositis yesterday.  ESR elevated at 56.  CRP within normal limits.  HIV/RPR nonreactive.  Myoglobin, ANA, aldolase are still pending.  TSH was low with T4 at 1.47 and normal T3 2.9. Can continue work up as outpatient. Will send all information to patient's PCP at [facility] -- outpatient podiatry or rheumatology f/u?  -- follow up labs  -- PT OT -- continue hard soled shoe  Dysphagia, improved Continue to work with SLP. Doing well. Prefers cold items.  -Continue home omeprazole  Hx HTN  Discontinued home lisinopril due to hyponatremia and doubled amlodipine  dose. -- continue amlodipine to 54m BID   RT humeral fracture  -- [place] discussed importance of using interpreter for all encounters including administration of medications with staff.  Constipation Patient has not had documented bowel movement since admission.   -MiraLAX and senna prn   Severe hyponatremia, Acute on chronic, resolved Osteoporosis T score -4.5 With acute Right humerus Fx Known advanced cervical spondylosis with moderate to severe spnial stenosis  -- o/p f/u   #FEN/GI:   Fluids: None  Electrolytes: Replete as needed  Nutrition: Dysphagia I diet   VTE prophylaxis: heparin   Disposition: pending disposition  Subjective:  Patient doing well this morning.  Objective: Temp:  [97.5 F (36.4 C)-97.7 F (36.5 C)] 97.6 F (36.4 C) (08/04 0537) Pulse Rate:  [91-98] 91 (08/04 0537) Resp:  [16] 16 (08/03 1336) BP: (107-117)/(61-66) 109/66 (08/04 0537) SpO2:  [94 %-99 %] 98 % (08/04 0537) Physical Exam: General: NAD, pleasant, sitting up in bed, thin elderly patient Cardiovascular: RRR, 2/6 murmur over aortic opost, no LE edema Respiratory: CTA BL, normal work of breathing MSK: moves 4 extremities equally, though limited active motion of the lower extremities. Derm: no rashes appreciated Neuro: CN II-XII grossly intact, alert to person and place.  Laboratory: Recent Labs  Lab 05/19/19 0358 05/20/19 0519 05/24/19 0225  WBC 6.7 8.0 8.4  HGB 9.0* 9.8* 9.7*  HCT 25.2* 27.6* 28.2*  PLT 261 285 291   Recent Labs  Lab 05/19/19 0358  05/23/19 0347 05/24/19 0225 05/25/19 0231  NA 123*   < > 131* 129* 130*  K 2.9*   < > 4.0 4.8 4.7  CL 96*   < > 96* 98 96*  CO2 19*   < > 26 23 23   BUN 14   < > 36* 36* 32*  CREATININE 0.59   < > 0.82 0.76 0.81  CALCIUM 8.0*   < > 9.3 9.1 9.5  PROT 5.4*  --   --   --  6.9  BILITOT 0.7  --   --   --  0.6  ALKPHOS 92  --   --   --  92  ALT 16  --   --   --   25  AST 14*  --   --   --  20  GLUCOSE 95   < > 130* 125* 125*   < > = values in this interval not displayed.   Barbara Oliphant, MD 05/25/2019, 8:58 AM PGY-3, [place] Intern pager: [phone], text pages welcome

## 2019-05-26 ENCOUNTER — Other Ambulatory Visit: Payer: Self-pay | Admitting: Family Medicine

## 2019-05-26 ENCOUNTER — Encounter: Payer: Self-pay | Admitting: Family Medicine

## 2019-05-26 DIAGNOSIS — D119 Benign neoplasm of major salivary gland, unspecified: Secondary | ICD-10-CM

## 2019-05-26 LAB — ANA: Anti Nuclear Antibody (ANA): POSITIVE — AB

## 2019-05-26 LAB — ALDOLASE: Aldolase: 2.4 U/L — ABNORMAL LOW (ref 3.3–10.3)

## 2019-05-26 LAB — MYOGLOBIN, SERUM: Myoglobin: 35 ng/mL (ref 25–58)

## 2019-05-26 MED ORDER — NAPROXEN 250 MG PO TABS
500.0000 mg | ORAL_TABLET | Freq: Two times a day (BID) | ORAL | Status: AC
  Administered 2019-05-26: 500 mg via ORAL
  Filled 2019-05-26: qty 2

## 2019-05-26 NOTE — TOC Initial Note (Signed)
Transition of Care [facility]) - Initial/Assessment Note    Patient Details  Name: Barbara Dennis MRN: [id number] Date of Birth: 1933-12-17  Transition of Care [facility]) CM/SW Contact:    Gelene Mink, [place] Phone Number: 05/26/2019, 2:42 PM  Clinical Narrative:                  CSW called and spoke with the patient's daughter and granddaughter. The granddaughter translated. CSW provided bed offers to the patient's family. They are agreeable to rehab but they wanted time to decide. The patient's daughter will be at hospital between 10:00am and 2:00pm. If a translator is needed, please contact the granddaughter. The patient and patient's daughter speak very little english.   CSW will continue to follow.   Expected Discharge Plan: Skilled Nursing Facility Barriers to Discharge: Insurance Authorization, Continued Medical Work up   Patient Goals and CMS Choice Patient states their goals for this hospitalization and ongoing recovery are:: Family is agreeable for her to go to rehab CMS Medicare.gov Compare Post Acute Care list provided to:: Patient Represenative (must comment) Choice offered to / list presented to : Adult Children  Expected Discharge Plan and Services Expected Discharge Plan: [place] In-house Referral: Clinical Social Work Discharge Planning Services: NA Post Acute Care Choice: [place] Living arrangements for the past 2 months: Single Family Home                 DME Arranged: N/A DME Agency: NA       HH Arranged: NA [place] Agency: NA        Prior Living Arrangements/Services Living arrangements for the past 2 months: [place] Lives with:: Adult Children Patient language and need for interpreter reviewed:: Yes Do you feel safe going back to the place where you live?: Yes      Need for Family Participation in Patient Care: Yes (Comment) Care giver support system in place?: Yes (comment)   Criminal Activity/Legal Involvement  Pertinent to Current Situation/Hospitalization: No - Comment as needed  Activities of Daily Living      Permission Sought/Granted Permission sought to share information with : Case Manager Permission granted to share information with : Yes, Verbal Permission Granted  Share Information with NAME: Clydell Hakim  Permission granted to share info w AGENCY: All SNF  Permission granted to share info w Relationship: Daughter  Permission granted to share info w Contact Information: [phone]  Emotional Assessment Appearance:: Appears stated age Attitude/Demeanor/Rapport: Unable to Assess Affect (typically observed): Unable to Assess Orientation: : Oriented to Self, Oriented to Place, Oriented to Situation Alcohol / Substance Use: Not Applicable Psych Involvement: No (comment)  Admission diagnosis:  Hyponatremia [E87.1] Closed displaced fracture of greater tuberosity of right humerus, initial encounter [S42.251A] Acute pain of right shoulder [M25.511] Patient Active Problem List   Diagnosis Date Noted  . Altered mental status 05/17/2019  . Acute pain of right shoulder   . Closed displaced fracture of greater tuberosity of right humerus   . Frequent falls 12/17/2018  . PVD (peripheral vascular disease) ([place]) 12/02/2018  . Osteopetrosis 12/01/2018  . Pedal edema 10/21/2018  . Osteoporosis 09/12/2018  . Overactive bladder 08/29/2018  . O2 dependent 08/11/2018  . Hyponatremia 08/02/2018  . Gastroesophageal reflux disease 07/16/2018  . Chronic pain syndrome 01/07/2018  . Chronic obstructive pulmonary disease ([place]) 08/20/2017  . Multinodular goiter 08/15/2017  . Bilateral leg pain 07/15/2017  . Chronic bilateral low back pain without sciatica 07/15/2017  . Essential  hypertension, benign 07/15/2017  . Hypercalcemia 07/15/2017  . Depression with anxiety 01/06/2015  . 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 DJD (degenerative joint disease) 02/16/2013  . Tobacco user 09/23/2012  .  Menopause 08/21/2012  . Postmenopausal atrophic vaginitis 08/21/2012   PCP:  Horald Pollen, MD Pharmacy:   [facility] [place], [address] [place] [address] Phone: [phone] Fax: [phone]     Social Determinants of Health (SDOH) Interventions    Readmission Risk Interventions No flowsheet data found.

## 2019-05-26 NOTE — Progress Notes (Signed)
  Speech Language Pathology Treatment: Dysphagia  Patient Details Name: JIMYA CIANI MRN: [id number] DOB: 03-16-1934 Today's Date: 05/26/2019 Time: 1009-1020 SLP Time Calculation (min) (ACUTE ONLY): 11 min  Assessment / Plan / Recommendation Clinical Impression  SLP provided skilled observation during breakfast meal with mild, diffuse oral residue noted after soft solids. Pt utilized primarily pureed boluses to clear this residue from her oral cavity, although liquids also worked well. No overt signs of aspiration are noted. Will continue to f/u briefly to determine if she can advance any further, although also considering her esophageal issues, this may be an appropriate diet for her hospital stay.   HPI HPI: Pt is an 85 y.o. female who was admitted 7/27 with AMS and falls, found to have severe hyponatremia with Na 109 requiring hypertonic saline. PMH significant for urinary incontinence, HTN , right neck mass (followed by serial CT scans, no biopsy), GERD and anemia. Per PCCM note, pt's friend shares that the pt has been having trouble swallowing, coughing and spitting up a lot of the food that she eats.       SLP Plan  Continue with current plan of care       Recommendations  Diet recommendations: Dysphagia 3 (mechanical soft);Thin liquid Liquids provided via: Cup;Straw Medication Administration: Whole meds with liquid Supervision: Staff to assist with self feeding;Full supervision/cueing for compensatory strategies Compensations: Minimize environmental distractions;Slow rate;Small sips/bites;Lingual sweep for clearance of pocketing Postural Changes and/or Swallow Maneuvers: Seated upright 90 degrees;Upright 30-60 min after meal                Oral Care Recommendations: Oral care BID Follow up Recommendations: 24 hour supervision/assistance;Skilled Nursing facility SLP Visit Diagnosis: Dysphagia, oral phase (R13.11) Plan: Continue with current plan of care       GO                 Venita Sheffield Yusuf Yu 05/26/2019, 11:01 AM  Pollyann Glen, M.A. [place] Acute Environmental education officer [phone] Office [phone]

## 2019-05-26 NOTE — Progress Notes (Signed)
Occupational Therapy Treatment Patient Details Name: Barbara Dennis MRN: [id number] DOB: June 12, 1934 Today's Date: 05/26/2019    History of present illness 85 yo female admitted with hyponatremia and frequent falls. Found to have rt shoulder fx. PMH - htn, copd, hyponatremia, rt rib fx's, T12 fx, frequent falls   OT comments  Pt making slow progress with functional goals. Pt required encouragement to participate this session. Pt declined OOB activity, but agreeable to sit EOB. Pt required max A to sit EOB. Pt sat EOB x 7 minutes for grooming min A, simulated bathing tasks mod - max A, for OT to adjust R UE sling and for general activity tolerance. Min guard A for sitting balance/support at EOB. OT will continue to follow  Follow Up Recommendations  SNF;Supervision/Assistance - 24 hour    Equipment Recommendations  Other (comment)(TBD at next venue of care)    Recommendations for Other Services      Precautions / Restrictions Precautions Precautions: Fall Precaution Comments: sling to support R UE Required Braces or Orthoses: Sling Restrictions Weight Bearing Restrictions: Yes RUE Weight Bearing: Non weight bearing       Mobility Bed Mobility Overal bed mobility: Needs Assistance Bed Mobility: Supine to Sit;Sit to Supine     Supine to sit: Max assist Sit to supine: Max assist   General bed mobility comments: assit to elevate trunk and with LEs to EOB and back onto bed  Transfers                 General transfer comment: declined    Balance Overall balance assessment: Needs assistance Sitting-balance support: Single extremity supported;Feet unsupported;Feet supported Sitting balance-Leahy Scale: Fair Sitting balance - Comments: Pt sat EOB x 7 minutes with min guard assist                                   ADL either performed or assessed with clinical judgement   ADL Overall ADL's : Needs assistance/impaired     Grooming: Sitting;Wash/dry  hands;Wash/dry face;Brushing hair;Minimal assistance Grooming Details (indicate cue type and reason): sitting EOB Upper Body Bathing: Moderate assistance;Sitting Upper Body Bathing Details (indicate cue type and reason): simulated Lower Body Bathing: Maximal assistance;Sitting/lateral leans Lower Body Bathing Details (indicate cue type and reason): simulated           Toilet Transfer Details (indicate cue type and reason): declined Toileting- Clothing Manipulation and Hygiene: Total assistance;Bed level         General ADL Comments: pt declining attempt to stand/OOB activity     Vision Patient Visual Report: No change from baseline     Perception     Praxis      Cognition Arousal/Alertness: Awake/alert Behavior During Therapy: Flat affect Overall Cognitive Status: No family/caregiver present to determine baseline cognitive functioning                                          Exercises     Shoulder Instructions       General Comments      Pertinent Vitals/ Pain       Pain Assessment: Faces Faces Pain Scale: Hurts whole lot Pain Location: neck, surgical site Pain Descriptors / Indicators: Grimacing;Discomfort;Shooting;Moaning Pain Intervention(s): Limited activity within patient's tolerance;Monitored during session;Repositioned  Home Living  Prior Functioning/Environment              Frequency  Min 2X/week        Progress Toward Goals  OT Goals(current goals can now be found in the care plan section)  Progress towards OT goals: Progressing toward goals  Acute Rehab OT Goals Patient Stated Goal: none stated  Plan Discharge plan remains appropriate    Co-evaluation                 AM-PAC OT "6 Clicks" Daily Activity     Outcome Measure   Help from another person eating meals?: A Little Help from another person taking care of personal grooming?: A Little Help  from another person toileting, which includes using toliet, bedpan, or urinal?: Total Help from another person bathing (including washing, rinsing, drying)?: A Lot Help from another person to put on and taking off regular upper body clothing?: A Lot Help from another person to put on and taking off regular lower body clothing?: Total 6 Click Score: 6    End of Session    OT Visit Diagnosis: Unsteadiness on feet (R26.81);Other abnormalities of gait and mobility (R26.89);Pain;History of falling (Z91.81);Muscle weakness (generalized) (M62.81);Other symptoms and signs involving cognitive function Pain - Right/Left: (B LEs, R shoulder) Pain - part of body: Shoulder;Leg   Activity Tolerance Patient limited by pain   Patient Left in bed;with call bell/phone within reach;with bed alarm set   Nurse Communication          Time: 0352-4818 OT Time Calculation (min): 16 min  Charges: OT General Charges $OT Visit: 1 Visit OT Treatments $Self Care/Home Management : 8-22 mins     Britt Bottom 05/26/2019, 3:14 PM

## 2019-05-26 NOTE — Progress Notes (Signed)
Nutrition Follow-up  RD working remotely.  DOCUMENTATION CODES:   Underweight  INTERVENTION:   -Continue MVI with minerals daily -Continue Magic cup TID with meals, each supplement provides 290 kcal and 9 grams of protein -Feeding assistance with meals -Pt with no documented BM since admission; consider aggressive bowel regimen  NUTRITION DIAGNOSIS:   Increased nutrient needs related to acute illness as evidenced by estimated needs.  Ongoing  GOAL:   Patient will meet greater than or equal to 90% of their needs  Progressing   MONITOR:   Supplement acceptance, PO intake, Diet advancement, Labs, Weight trends, Skin, I & O's  REASON FOR ASSESSMENT:   Other (Comment)    ASSESSMENT:   85 yr old F w/ PMHx sig for urinary incontinence, HTN , right neck mass, GERD and anemia. Presents s/p falls at home Na + 109 on Labs PCCM consulted for symptomatic hyponatremia  7/28- s/p MBSS- advanced to dysphagia 1 diet with thin liquids 8/4- s/p US guided biopsy of indeterminate mass within the right side of the neck and US guided FNA of indeterminate nodule within the deep aspect of the left parotid gland; s/p BSE- advanced to dysphagia 3 diet with thin liquids  Reviewed I/O's: -225 ml x 24 hours and +472 ml since admission  UOP: 600 ml x 24 hours  Per nursing notes, pt is receiving feeding assistance due to lack of arm strength in lt arm to maneuver food off of meal tray. Pt also preferring cold items, such as ice cream and applesauce.   Pt intake has improved. Noted meal completion 25-100%. Per SLP, pt more alert and oriented yesterday and was requesting [place] food items, such as fried rice.  Reviewed wts; pt has experienced a 9.4% wt gain over the past week, which is favorable given increased oral intake and underweight status.   Medications reviewed and include miralax. Pt with no documented BM since admission.   Per CSW notes, plan to d/c to SNF once bed is available.    Labs reviewed: Na: 130.   Diet Order:   Diet Order            DIET DYS 3 Room service appropriate? Yes; Fluid consistency: Thin  Diet effective now              EDUCATION NEEDS:   No education needs have been identified at this time  Skin:  Skin Assessment: Skin Integrity Issues: Skin Integrity Issues:: Incisions Incisions: rt and lt neck  Last BM:  Unknown  Height:   Ht Readings from Last 1 Encounters:  05/17/19 5' (1.524 m)    Weight:   Wt Readings from Last 1 Encounters:  05/26/19 46.7 kg    Ideal Body Weight:  45.5 kg  BMI:  Body mass index is 20.12 kg/m.  Estimated Nutritional Needs:   Kcal:  1200-1400  Protein:  50-65 grams  Fluid:  > 1.2 L    Kasin Tonkinson A. Jimmye Norman, RD, LDN, [place] Registered Dietitian II Certified Diabetes Care and Education Specialist Pager: [phone] After hours Pager: [phone]

## 2019-05-26 NOTE — Progress Notes (Signed)
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 Will notify patient and family of benign results.   Wilber Oliphant, M.D.  PGY-2  Family Medicine  [phone] 05/26/2019 5:30 PM

## 2019-05-26 NOTE — Progress Notes (Signed)
Family Medicine Teaching Service Daily Progress Note Intern Pager: [phone]  Patient name: Barbara Dennis Medical record number: [id number] Date of birth: 04/10/1934 Age: 85 y.o. Gender: female  Primary Care Provider: Horald Pollen, MD Consultants: ortho, IR, PT, OT, SW Code Status: Full  Pt Overview and Major Events to Date:  7/27: admitted for falls, hyponatremia 8/4: Biopsy of neck mass  (parotid likely)   Assessment and Plan: Barbara Dennis is a 85 y.o. female who presented w/ hyponatremia to 109, frequent falls, and acute dysphagia. PMHx s/f ACD, wedge compression fx T11-T12, hx, frequent falls, COPD, tobacco use, severe cervical degeneration and stenosis, age related osteoporosis with right humeral fracture, GERD.   AMS-resolved Pts mental status back to baseline  -- SW consulted for SNF placement.   -- continue ramelteon QHS  -- COVID testing  Right neck mass  Pt had biopsies on 8/4 for neck masses, pt feels some pain on left lateral neck today. -- resume diet after procedure  --awaiting biopsies for neck mass  Right foot painand frequent falls Started lab work-up for inflammatory myositis yesterday.  ESR elevated at 56.  CRP within normal limits.  HIV/RPR nonreactive.  Myoglobin, ANA, aldolase are still pending.  TSH was low with T4 at 1.47 and normal T3 2.9. Can continue work up as outpatient. Will send all information to patient's PCP at [facility] -- outpatient follow up with rheumatology  -- follow up labs  -- PT OT -- continue hard soled shoe  Dysphagia, improved Continue to work with SLP. Doing well. Prefers cold items.  -Continue home omeprazole  HTN-chronic, stable   BP 109/56 Doubled amlodipine dose  -- continue amlodipine to 27m BID  --continue to monitor BP   RT humeral fracture  --continue sling  --requires outpatient follow up with Ortho --require outpatient xrays of right humerus    Language Barrier affecting care discussed importance  of using interpreter for all encounters including administration of medications with staff.  Constipation Patient has not had documented bowel movement since admission.   -MiraLAX and senna prn   Severe hyponatremia, Acute on chronic, resolved Osteoporosis T score -4.5 With acute Right humerus Fx Known advanced cervical spondylosis with moderate to severe spnial stenosis  -- o/p f/u   #FEN/GI:   Fluids: None  Electrolytes: Replete as needed  Nutrition: Dysphagia I diet   VTE prophylaxis: heparin   Disposition: pending disposition  Subjective:  Patient doing well this morning.  Pt complains of right foot pain. Eating breakfast well.   Objective: Temp:  [97.6 F (36.4 C)-98.6 F (37 C)] 98.2 F (36.8 C) (08/05 1325) Pulse Rate:  [86-102] 92 (08/05 1325) Resp:  [16] 16 (08/05 1325) BP: (101-112)/(55-59) 112/59 (08/05 1325) SpO2:  [94 %-97 %] 96 % (08/05 1325) Weight:  [46.7 kg] 46.7 kg (08/05 0500)   KMicronesiainterpretor present  General: Alert, cooperative and appears to be in no acute distress HEENT: Neck with ecchymosis and swelling at sites of biopsy on left lateral neck. Pt has full ROM in neck.  Cardio: RRR Pulm: Normal respiratory effort Abdomen: non distended. Abdomen soft and non-tender.  Extremities: extremetoes warm and well perfused. No peripheral edema. Warm/ well perfused.  Pedal pulses 1+ bilaterally Neuro: Cranial nerves grossly intact  Psych: normal mood, normal affect   Laboratory: Recent Labs  Lab 05/20/19 0519 05/24/19 0225  WBC 8.0 8.4  HGB 9.8* 9.7*  HCT 27.6* 28.2*  PLT 285 291   Recent Labs  Lab 05/23/19 0347  05/24/19 0225 05/25/19 0231  NA 131* 129* 130*  K 4.0 4.8 4.7  CL 96* 98 96*  CO2 26 23 23   BUN 36* 36* 32*  CREATININE 0.82 0.76 0.81  CALCIUM 9.3 9.1 9.5  PROT  --   --  6.9  BILITOT  --   --  0.6  ALKPHOS  --   --  92  ALT  --   --  25  AST  --   --  20  GLUCOSE 130* 125* 125*   Lattie Haw, MD 05/26/2019,  1:26 PM PGY-1, [place] Intern pager: [phone], text pages welcome

## 2019-05-27 ENCOUNTER — Inpatient Hospital Stay (HOSPITAL_COMMUNITY): Payer: Medicare Other

## 2019-05-27 DIAGNOSIS — M25562 Pain in left knee: Secondary | ICD-10-CM

## 2019-05-27 DIAGNOSIS — M8000XD Age-related osteoporosis with current pathological fracture, unspecified site, subsequent encounter for fracture with routine healing: Secondary | ICD-10-CM

## 2019-05-27 DIAGNOSIS — G8929 Other chronic pain: Secondary | ICD-10-CM

## 2019-05-27 DIAGNOSIS — R221 Localized swelling, mass and lump, neck: Secondary | ICD-10-CM

## 2019-05-27 LAB — SARS CORONAVIRUS 2 (TAT 6-24 HRS): SARS Coronavirus 2: NEGATIVE

## 2019-05-27 MED ORDER — ENOXAPARIN SODIUM 30 MG/0.3ML ~~LOC~~ SOLN
30.0000 mg | SUBCUTANEOUS | Status: DC
  Administered 2019-05-27 – 2019-05-28 (×2): 30 mg via SUBCUTANEOUS
  Filled 2019-05-27 (×2): qty 0.3

## 2019-05-27 MED ORDER — SENNA 8.6 MG PO TABS
1.0000 | ORAL_TABLET | Freq: Every day | ORAL | Status: DC
  Administered 2019-05-27 – 2019-05-29 (×3): 8.6 mg via ORAL
  Filled 2019-05-27 (×2): qty 1

## 2019-05-27 MED ORDER — PREDNISONE 20 MG PO TABS
20.0000 mg | ORAL_TABLET | Freq: Every day | ORAL | Status: DC
  Administered 2019-05-28 – 2019-05-29 (×2): 20 mg via ORAL
  Filled 2019-05-27 (×2): qty 1

## 2019-05-27 MED ORDER — PREDNISONE 20 MG PO TABS: 20.0000 mg | ORAL_TABLET | Freq: Every day | ORAL | 0 refills | Status: DC

## 2019-05-27 MED ORDER — PREDNISONE 20 MG PO TABS
20.0000 mg | ORAL_TABLET | Freq: Every day | ORAL | Status: DC
  Administered 2019-05-27: 20 mg via ORAL
  Filled 2019-05-27: qty 1

## 2019-05-27 NOTE — Consult Note (Signed)
   [facility] CM Inpatient Consult   05/27/2019  Barbara Dennis [birth year] [id number]    Patient reviewed for long length of stay and for possible need of [facility] care management services with 15% medium risk score for unplanned readmission and hospitalization under her [employer].   Review of medical record shows that:  Barbara Dennis is a 85 y.o. female with past medical history significant for hypertension, urinary incontinence, GERD, anemia who was admitted to the ICU on admission for hyponatremia of 109.   Patient also presented with altered mental status, recent dysphasia and pain in her legs.  Primary Care Provider is Dr. Ines Bloomer Sagardia with Primary Care at [facility].  Brief chart review of PT recommendations noted and patient is being recommended for skilled nursing facility (SNF) for rehab.  Please place a [facility] Care Management consult for follow-up as appropriate.    For questions and referral, please contact:  Koda Defrank A. Witten Certain, BSN, RN-BC [facility] Liaison Cell: [phone]

## 2019-05-27 NOTE — Progress Notes (Signed)
Family Medicine Teaching Service Daily Progress Note Intern Pager: [phone]  Patient name: Barbara Dennis Medical record number: [id number] Date of birth: 05-Jul-1934 Age: 85 y.o. Gender: female  Primary Care Provider: Horald Pollen, MD Consultants: ortho, IR, PT, OT, SW Code Status: Full  Pt Overview and Major Events to Date:  7/27: admitted for falls, hyponatremia 8/4: Biopsy of neck mass  (parotid likely)   Assessment and Plan: Barbara Dennis is a 85 y.o. female who presented w/ hyponatremia to 109, frequent falls, and acute dysphagia. PMHx s/f ACD, wedge compression fx T11-T12, hx, frequent falls, COPD, tobacco use, severe cervical degeneration and stenosis, age related osteoporosis with right humeral fracture, GERD.   AMS-resolved Pts mental status back to baseline  -- SW consulted for SNF placement.   -- continue ramelteon QHS  -- COVID testing  Right neck mass  Reports right neck pain still present. 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  Patient and family notified. -- resume diet after procedure  --awaiting biopsies for neck mass  Right foot painand frequent falls Pt still reports right foot pain. Started lab work-up for inflammatory myositis yesterday.  ESR elevated at 56.  CRP within normal limits.  HIV/RPR nonreactive.  Myoglobin, ANA, aldolase are still pending.  TSH was low with T4 at 1.47 and normal T3 2.9. Can continue work up as outpatient. Will send all information to patient's PCP at [facility]. ANA positive -- outpatient follow up with rheumatology  -- follow up labs  -- PT OT -- continue hard soled shoe --Prednisone 30m 7 day course to help with inflammatory component   Dysphagia, improved Continue to work with SLP. Doing well. Prefers cold items.  -Continue home omeprazole  HTN-chronic, stable   BP 118/66 Doubled amlodipine dose  -- continue amlodipine to 129mBID  --continue to monitor BP   RT humeral fracture  --continue sling   --requires outpatient follow up with Ortho --require outpatient xrays of right humerus    Language Barrier affecting care Discussed importance of using interpreter for all encounters including administration of medications with staff.  Constipation Patient has not had documented bowel movement since admission.   -MiraLAX and senna prn   Severe hyponatremia, Acute on chronic, resolved Osteoporosis T score -4.5 With acute Right humerus Fx Known advanced cervical spondylosis with moderate to severe spnial stenosis  -- o/p f/u   FEN/GI:  Fluids: None Electrolytes: Replete as needed Nutrition: Dysphagia I diet   VTE prophylaxis: heparin   Disposition: Waiting SNF placement  Subjective:  KoMicronesianterpretor present. Pt reports pain in right humerus, but is not wearing sling. Also reports neck pain, right foot pain and chest pain (chronic). Did not feel liek eating today. No new concerns.  Objective: Temp:  [97.5 F (36.4 C)-98.2 F (36.8 C)] 97.5 F (36.4 C) (08/06 0612) Pulse Rate:  [82-92] 82 (08/06 0612) Resp:  [16] 16 (08/05 1325) BP: (109-118)/(56-66) 118/66 (08/06 0612) SpO2:  [96 %-97 %] 97 % (08/06 0612)   Koreaninterpreter present General: Alert and cooperative and appears to be in no acute distress HEENT: Neck non-tender without lymphadenopathy, masses or thyromegaly Cardio: Normal S1 and S2, no S3 or S4. Rhythm is regular. No murmurs or rubs.   Pulm: Clear to auscultation bilaterally anteriorly, no crackles, wheezing, or diminished breath sounds. Normal respiratory effort Abdomen: Bowel sounds normal. Abdomen soft and non-tender.  Extremities: No peripheral edema. Warm/ well perfused. Calves snt. Pedal pulses 1+ bilaterally Neuro: Cranial nerves grossly intact  Laboratory:  Recent Labs  Lab 05/24/19 0225  WBC 8.4  HGB 9.7*  HCT 28.2*  PLT 291   Recent Labs  Lab 05/23/19 0347 05/24/19 0225 05/25/19 0231  NA 131* 129* 130*  K 4.0 4.8 4.7  CL 96*  98 96*  CO2 _0 BUN 36* 36* 32*  CREATININE 0.82 0.76 0.81  CALCIUM 9.3 9.1 9.5  PROT  --   --  6.9  BILITOT  --   --  0.6  ALKPHOS  --   --  92  ALT  --   --  25  AST  --   --  20  GLUCOSE 130* 125* 125*   Lattie Haw, MD 05/27/2019, 6:48 AM PGY-1, [place] Intern pager: [phone], text pages welcome

## 2019-05-27 NOTE — Progress Notes (Signed)
PT Cancellation Note  Patient Details Name: Barbara Dennis MRN: [id number] DOB: Mar 19, 1934   Cancelled Treatment:    Reason Eval/Treat Not Completed: Other (comment).  Pt is eating and then with nursing, PT will reattempt at another time.   Ramond Dial 05/27/2019, 2:41 PM   Mee Hives, PT MS Acute Rehab Dept. Number: [place] and [place]

## 2019-05-27 NOTE — Progress Notes (Signed)
ANA positive and ESR elevated. Patient's pain has not been well controlled. MRI with myositis of right foot flexors. Will trial 31m prednisone burst x 1 week for pain improvement in hopes to improve foot pain and thus function, allowing improved PT /OT participation  - 229mprednisone daily x 7 days - monitor pain   RaWilber OliphantM.D.  PGY-2  Family Medicine  [phone]/03/2019 11:38 AM

## 2019-05-27 NOTE — Progress Notes (Addendum)
[name] caretaker at bedside. Pt changed, purwick changed.  Translator service used. MDs in to see pt. I gave pt prn Tylenol prior - pt reported 7/10 pain in throat (bandage to right throat biopsy site intact: purple bruising noted).  Pt reports she takes pills with water - tends to let dissolve in her mouth. No s/s aspiration noted.  Pt and caretaker noted that pt's normal bowel movements are "thick" and every 2 to 3 days. They were very concerned about too much laxative causing low sodium. MD able to educate pt concerning this

## 2019-05-28 NOTE — Progress Notes (Signed)
Family Medicine Teaching Service Daily Progress Note Intern Pager: [phone]  Patient name: Barbara Dennis Medical record number: [id number] Date of birth: [birth year] Age: 85 y.o. Gender: female  Primary Care Provider: Horald Pollen, MD Consultants: ortho, IR, PT, OT, SW Code Status: Full  Pt Overview and Major Events to Date:  7/27: admitted for falls, hyponatremia 8/4: Biopsy of neck mass--Warthin's tumor, benign.   Assessment and Plan: Barbara Dennis is a 85 y.o. female who presented w/ hyponatremia to 109, frequent falls, and acute dysphagia. PMHx s/f ACD, wedge compression fx T11-T12, hx, frequent falls, COPD, tobacco use, severe cervical degeneration and stenosis, age related osteoporosis with right humeral fracture, GERD.   AMS-resolved Pts mental status back to baseline.  This morning, she is able to tell me her full name and that she was in the hospital.  She was not familiar with the month or year.  As far as I am aware, this is patient's baseline mental status. - SW consulted for SNF placement.   - continue ramelteon QHS  - COVID-negative  Right neck mass-resolved She did not report any neck pain this morning.  We briefly discussed the results of her biopsy that they did not warrant further investigation.  She acknowledged understanding. -No further work-up at this time  Right foot painand frequent falls-ongoing Pt still reports right foot pain and prednisone has not helped as of yet Started lab work-up for inflammatory myositis. ESR elevated at 56.  CRP within normal limits.  HIV/RPR nonreactive.  Myoglobin, ANA, aldolase are still pending.  TSH was low with T4 at 1.47 and normal T3 2.9. Can continue work up as outpatient. Will send all information to patient's PCP at [facility].  ANA positive. -Tylenol PRN -Tramadol with PT - outpatient follow up with rheumatology  - PT OT - continue hard soled shoe -Prednisone 94m 7 day course to help with inflammatory  component   Dysphagia, improved SLP now recommending dysphasia 3 diet with thin liquids. -Dysphagia 3 diet with thin liquids -Continue home omeprazole  HTN-chronic, stable   Systolic pressures 928-366in the past 24 hours, diastolic pressures 529-47  Amlodipine recently increased to 10 mg daily. In the setting of recent falls, it may be the patient's benefit to reduce antihypertensives. - reduce amlodipine to 5 mg BID   RT humeral fracture  -continue sling  -outpatient follow up with Ortho -require outpatient xrays of right humerus    Language Barrier affecting care Discussed importance of using interpreter for all encounters including administration of medications with staff.  Constipation Patient has not had documented bowel movement since admission.   -MiraLAX and senna prn   Diet: Dysphagia 3, thin liquids VTE prophylaxis: heparin   Disposition: Medically stable for discharge.  SNF placement  Subjective:  No acute events overnight.  No new complaints this morning.  She continues to note that she does have some right foot pain and continues to to be tender to palpation in her right foot.  She reports that the pain does improve with Tylenol.  Otherwise, she is generally feeling well.  Objective: Temp:  [97.7 F (36.5 C)-98.1 F (36.7 C)] 97.7 F (36.5 C) (08/07 1341) Pulse Rate:  [89-97] 97 (08/07 1341) Resp:  [18] 18 (08/07 1341) BP: (103-141)/(61-73) 103/61 (08/07 1341) SpO2:  [94 %-99 %] 94 % (08/07 1341) Weight:  [49 kg] 49 kg (08/07 0432)   General: Alert and cooperative and appears to be in no acute distress HEENT: Bruising noted around her  right SCM.  Moist mucous membranes. Cardio: Normal S1 and S2, no S3 or S4. Rhythm is regular. No murmurs or rubs.   Pulm: Clear to auscultation bilaterally, no crackles, wheezing, or diminished breath sounds. Normal respiratory effort Abdomen: Bowel sounds normal. Abdomen soft and non-tender.  Extremities: No peripheral  edema.  Warm/well perfused.  Strong radial pulse.  Bruising noted on dorsum of right foot and is tender to palpation around bruise in addition to the lateral malleolus of the right foot. Neuro: Cranial nerves grossly intact   Laboratory: Recent Labs  Lab 05/24/19 0225  WBC 8.4  HGB 9.7*  HCT 28.2*  PLT 291   Recent Labs  Lab 05/23/19 0347 05/24/19 0225 05/25/19 0231  NA 131* 129* 130*  K 4.0 4.8 4.7  CL 96* 98 96*  CO2 26 23 23   BUN 36* 36* 32*  CREATININE 0.82 0.76 0.81  CALCIUM 9.3 9.1 9.5  PROT  --   --  6.9  BILITOT  --   --  0.6  ALKPHOS  --   --  92  ALT  --   --  25  AST  --   --  20  GLUCOSE 130* 125* 125*   Matilde Haymaker, MD 05/28/2019, 9:30 PM PGY-2, [place] Intern pager: [phone], text pages welcome

## 2019-05-28 NOTE — Progress Notes (Signed)
Family Medicine Teaching Service Daily Progress Note Intern Pager: [phone]  Patient name: Barbara Dennis Medical record number: [id number] Date of birth: Jul 23, 1934 Age: 85 y.o. Gender: female  Primary Care Provider: Horald Pollen, MD Consultants: ortho, IR, PT, OT, SW Code Status: Full  Pt Overview and Major Events to Date:  7/27: admitted for falls, hyponatremia 8/4: Biopsy of neck mass--Warthin's tumor, benign.   Assessment and Plan: Barbara Dennis is a 85 y.o. female who presented w/ hyponatremia to 109, frequent falls, and acute dysphagia. PMHx s/f ACD, wedge compression fx T11-T12, hx, frequent falls, COPD, tobacco use, severe cervical degeneration and stenosis, age related osteoporosis with right humeral fracture, GERD.   AMS-resolved Pts mental status back to baseline  -- SW consulted for SNF placement.   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 Patient and family notified. --Dysphagia III diet  Right foot painand frequent falls-ongoing Pt still reports right foot pain and prednisone has not helped as of yet Started lab work-up for inflammatory myositis. ESR elevated at 56.  CRP within normal limits.  HIV/RPR nonreactive.  Myoglobin, ANA, aldolase are still pending.  TSH was low with T4 at 1.47 and normal T3 2.9. Can continue work up as outpatient. Will send all information to patient's PCP at [facility].  ANA positive -- outpatient follow up with rheumatology  -- follow up labs  -- PT OT -- continue hard soled shoe --Prednisone '20mg'$  7 day course to help with inflammatory component   Dysphagia, improved Continue to work with SLP. Doing well. Prefers cold items.  -Continue home omeprazole  HTN-chronic, stable   BP 141/73 Doubled amlodipine dose  -- continue amlodipine to '10mg'$  BID  --continue to monitor BP   RT humeral fracture   --continue sling  --requires outpatient follow up with Ortho --require outpatient xrays of right humerus    Language Barrier affecting care Discussed importance of using interpreter for all encounters including administration of medications with staff.  Constipation Patient has not had documented bowel movement since admission.   -MiraLAX and senna prn   Severe hyponatremia, Acute on chronic, resolved Osteoporosis T score -4.5 With acute Right humerus Fx Known advanced cervical spondylosis with moderate to severe spnial stenosis  -- o/p f/u   FEN/GI:  Fluids: None Electrolytes: Replete as needed Nutrition: Dysphagia I diet   VTE prophylaxis: heparin   Disposition: SNF placement  Subjective:  [place] interpretor present. Patient reports right sided foot pain is still present and generalized body aches. No new concerns  Objective: Temp:  [97.7 F (36.5 C)-98.1 F (36.7 C)] 98.1 F (36.7 C) (08/07 0432) Pulse Rate:  [89-97] 89 (08/07 0432) Resp:  [17-18] 18 (08/07 0432) BP: (114-141)/(59-73) 141/73 (08/07 0432) SpO2:  [94 %-99 %] 99 % (08/07 0432) Weight:  [49 kg] 49 kg (08/07 0432)   [place] interpretor present  General: Alert, eating breakfast. Cooperative and appears to be in no acute distress HEENT: Neck non-tender without lymphadenopathy, masses or thyromegaly Cardio: Normal S1 and S2, no S3 or S4. Rhythm is regular. No murmurs or rubs.   Pulm: Clear to auscultation bilaterally anteriorly, no crackles, wheezing, or diminished breath sounds. Normal respiratory effort Abdomen: Bowel sounds normal. Abdomen soft and non-tender.  Extremities: No peripheral edema. Warm/ well perfused. Strong radial pulse. Right foot warm and well-perfused 1+ DP and PT pulses.  Ecchymosis noted right foot.  Neuro: Cranial nerves  grossly intact  Laboratory: Recent Labs  Lab 05/24/19 0225  WBC 8.4  HGB 9.7*  HCT 28.2*  PLT 291   Recent Labs  Lab 05/23/19 0347 05/24/19 0225  05/25/19 0231  NA 131* 129* 130*  K 4.0 4.8 4.7  CL 96* 98 96*  CO2 _0 BUN 36* 36* 32*  CREATININE 0.82 0.76 0.81  CALCIUM 9.3 9.1 9.5  PROT  --   --  6.9  BILITOT  --   --  0.6  ALKPHOS  --   --  92  ALT  --   --  25  AST  --   --  20  GLUCOSE 130* 125* 125*   Lattie Haw, MD 05/28/2019, 6:28 AM PGY-1, [place] Intern pager: [phone], text pages welcome

## 2019-05-28 NOTE — Progress Notes (Signed)
  Speech Language Pathology Treatment: Dysphagia  Patient Details Name: Barbara Dennis MRN: [id number] DOB: Jul 17, 1934 Today's Date: 05/28/2019 Time: 9558-3167 SLP Time Calculation (min) (ACUTE ONLY): 19 min  Assessment / Plan / Recommendation Clinical Impression  Patient eating lunch when entering room. [name] daughter was feeding her soup (Korean soup with rice, spinach and thin broth). Observed remainder of meal. She had prolonged mastication, however no over s/sx of aspiration. Pt spoke to her daughter several times throughout evaluation with clear vocal quality. No change in respiration. Educated daughter (who speaks English) aspiration precautions and she acknowledge. Due to patient's esophageal dysmotility and oral dysphagia, would not recommend advancing diet to regular consistencies. ST to s/o at this time.  All swallowing goals are met.    HPI HPI: Pt is an 85 y.o. female who was admitted 7/27 with AMS and falls, found to have severe hyponatremia with Na 109 requiring hypertonic saline. PMH significant for urinary incontinence, HTN , right neck mass (followed by serial CT scans, no biopsy), GERD and anemia. Per PCCM note, pt's friend shares that the pt has been having trouble swallowing, coughing and spitting up a lot of the food that she eats.       SLP Plan  All goals met       Recommendations  Diet recommendations: Dysphagia 3 (mechanical soft);Thin liquid Liquids provided via: Cup;Straw Medication Administration: Whole meds with liquid Supervision: Staff to assist with self feeding;Full supervision/cueing for compensatory strategies Compensations: Minimize environmental distractions;Slow rate;Small sips/bites;Lingual sweep for clearance of pocketing Postural Changes and/or Swallow Maneuvers: Seated upright 90 degrees;Upright 30-60 min after meal                Oral Care Recommendations: Oral care BID Follow up Recommendations: 24 hour supervision/assistance;Skilled  Nursing facility SLP Visit Diagnosis: Dysphagia, oral phase (R13.11);Other (comment)(esophagram showed esophageal dysmotility) Plan: All goals met       [place], MA, CCC-SLP 05/28/2019 12:10 PM

## 2019-05-28 NOTE — Progress Notes (Signed)
PT Cancellation Note  Patient Details Name: Barbara Dennis MRN: [id number] DOB: August 05, 1934   Cancelled Treatment:    Reason Eval/Treat Not Completed: Other (comment).  Pt declined PT via translator due to feeling her legs were uncomfortable, and is waiting for pain meds with RN who is in with a contact guard pt.  Pt was informed nursing would be in as soon as possible via translator.  Will try again at another time as pt and time allow.   Ramond Dial 05/28/2019, 4:23 PM   Mee Hives, PT MS Acute Rehab Dept. Number: [place] and [place]

## 2019-05-29 DIAGNOSIS — L89112 Pressure ulcer of right upper back, stage 2: Secondary | ICD-10-CM | POA: Diagnosis not present

## 2019-05-29 DIAGNOSIS — I10 Essential (primary) hypertension: Secondary | ICD-10-CM | POA: Diagnosis not present

## 2019-05-29 DIAGNOSIS — Z7401 Bed confinement status: Secondary | ICD-10-CM | POA: Diagnosis not present

## 2019-05-29 DIAGNOSIS — G319 Degenerative disease of nervous system, unspecified: Secondary | ICD-10-CM | POA: Diagnosis not present

## 2019-05-29 DIAGNOSIS — S42251A Displaced fracture of greater tuberosity of right humerus, initial encounter for closed fracture: Secondary | ICD-10-CM | POA: Diagnosis not present

## 2019-05-29 DIAGNOSIS — R2689 Other abnormalities of gait and mobility: Secondary | ICD-10-CM | POA: Diagnosis not present

## 2019-05-29 DIAGNOSIS — R221 Localized swelling, mass and lump, neck: Secondary | ICD-10-CM | POA: Diagnosis not present

## 2019-05-29 DIAGNOSIS — M810 Age-related osteoporosis without current pathological fracture: Secondary | ICD-10-CM | POA: Diagnosis not present

## 2019-05-29 DIAGNOSIS — R410 Disorientation, unspecified: Secondary | ICD-10-CM | POA: Diagnosis not present

## 2019-05-29 DIAGNOSIS — E871 Hypo-osmolality and hyponatremia: Secondary | ICD-10-CM | POA: Diagnosis not present

## 2019-05-29 DIAGNOSIS — S42251D Displaced fracture of greater tuberosity of right humerus, subsequent encounter for fracture with routine healing: Secondary | ICD-10-CM | POA: Diagnosis not present

## 2019-05-29 DIAGNOSIS — J440 Chronic obstructive pulmonary disease with acute lower respiratory infection: Secondary | ICD-10-CM | POA: Diagnosis not present

## 2019-05-29 DIAGNOSIS — M2550 Pain in unspecified joint: Secondary | ICD-10-CM | POA: Diagnosis not present

## 2019-05-29 DIAGNOSIS — R5381 Other malaise: Secondary | ICD-10-CM | POA: Diagnosis not present

## 2019-05-29 DIAGNOSIS — D649 Anemia, unspecified: Secondary | ICD-10-CM | POA: Diagnosis not present

## 2019-05-29 DIAGNOSIS — M6281 Muscle weakness (generalized): Secondary | ICD-10-CM | POA: Diagnosis not present

## 2019-05-29 DIAGNOSIS — M79671 Pain in right foot: Secondary | ICD-10-CM | POA: Diagnosis not present

## 2019-05-29 DIAGNOSIS — I469 Cardiac arrest, cause unspecified: Secondary | ICD-10-CM | POA: Diagnosis not present

## 2019-05-29 DIAGNOSIS — S22080A Wedge compression fracture of T11-T12 vertebra, initial encounter for closed fracture: Secondary | ICD-10-CM | POA: Diagnosis not present

## 2019-05-29 DIAGNOSIS — R1312 Dysphagia, oropharyngeal phase: Secondary | ICD-10-CM | POA: Diagnosis not present

## 2019-05-29 DIAGNOSIS — L89326 Pressure-induced deep tissue damage of left buttock: Secondary | ICD-10-CM | POA: Diagnosis not present

## 2019-05-29 DIAGNOSIS — K2970 Gastritis, unspecified, without bleeding: Secondary | ICD-10-CM | POA: Diagnosis not present

## 2019-05-29 DIAGNOSIS — N3281 Overactive bladder: Secondary | ICD-10-CM | POA: Diagnosis not present

## 2019-05-29 DIAGNOSIS — M8000XD Age-related osteoporosis with current pathological fracture, unspecified site, subsequent encounter for fracture with routine healing: Secondary | ICD-10-CM | POA: Diagnosis not present

## 2019-05-29 MED ORDER — AMLODIPINE BESYLATE 5 MG PO TABS
5.0000 mg | ORAL_TABLET | Freq: Every day | ORAL | Status: DC
  Administered 2019-05-29: 5 mg via ORAL
  Filled 2019-05-29: qty 1

## 2019-05-29 MED ORDER — TRAMADOL HCL 50 MG PO TABS: 50.0000 mg | ORAL_TABLET | Freq: Two times a day (BID) | ORAL | Status: DC | PRN

## 2019-05-29 NOTE — Progress Notes (Addendum)
Patient was discharged to [place] [facility]) by MD order; discharged instructions review and sent to facility via PTAR with care notes; IV DIC; facility was called and report was given to the nurse who is going to receive the patient; patient will be transported to facility via [place] . Patient's daughter at bedside.

## 2019-05-29 NOTE — TOC Progression Note (Signed)
Transition of Care [facility]) - Progression Note    Patient Details  Name: Barbara Dennis MRN: [id number] Date of Birth: [birth year]  Transition of Care [facility]) CM/SW [place], LCSW Phone Number: 05/29/2019, 10:17 AM  Clinical Narrative: Per TOC handoff, Isaias [facility]man received insurance authorization yesterday. CSW spoke with admissions coordinator today and they confirmed they can take her today if stable. Admissions coordinator was unaware patient would need an interpreter. CSW explained that granddaughter has been interpreting here. They are still agreeable to taking her.    Expected Discharge Plan: Skilled Nursing Facility Barriers to Discharge: Ship broker, Continued Medical Work up  Expected Discharge Plan and Services Expected Discharge Plan: [place] In-house Referral: Clinical Social Work Discharge Planning Services: NA Post Acute Care Choice: [place] Living arrangements for the past 2 months: Single Family Home                 DME Arranged: N/A DME Agency: NA       HH Arranged: NA HH Agency: NA         Social Determinants of Health (SDOH) Interventions    Readmission Risk Interventions No flowsheet data found.

## 2019-05-29 NOTE — TOC Transition Note (Signed)
Transition of Care [facility]) - CM/SW Discharge Note   Patient Details  Name: Barbara Dennis MRN: [id number] Date of Birth: [birth year]  Transition of Care [facility]) CM/SW Contact:  Candie Chroman, LCSW Phone Number: 05/29/2019, 12:17 PM   Clinical Narrative: CSW facilitated patient discharge including contacting patient family and facility to confirm patient discharge plans. Clinical information faxed to facility and family agreeable with plan. CSW arranged ambulance transport via PTAR to [facility] at 1:30. RN to call report prior to discharge ([place]) at 1:30.  CSW will sign off for now as social work intervention is no longer needed. Please consult [place] again if new needs arise.  Final next level of care: Skilled Nursing Facility Barriers to Discharge: Barriers Resolved   Patient Goals and CMS Choice Patient states their goals for this hospitalization and ongoing recovery are:: Family is agreeable for her to go to rehab CMS Medicare.gov Compare Post Acute Care list provided to:: Patient Represenative (must comment) Choice offered to / list presented to : Adult Children  Discharge Placement   Existing PASRR number confirmed : 05/25/19          Patient chooses bed at: [facility] Patient to be transferred to facility by: [place] Name of family member notified: SNF admissions coordinator notified her granddaughter. Patient and family notified of of transfer: 05/29/19  Discharge Plan and Services In-house Referral: Clinical Social Work Discharge Planning Services: NA Post Acute Care Choice: [place]          DME Arranged: N/A DME Agency: NA       HH Arranged: NA HH Agency: NA        Social Determinants of Health (SDOH) Interventions     Readmission Risk Interventions No flowsheet data found.

## 2019-05-31 DIAGNOSIS — E871 Hypo-osmolality and hyponatremia: Secondary | ICD-10-CM | POA: Diagnosis not present

## 2019-05-31 DIAGNOSIS — M8000XD Age-related osteoporosis with current pathological fracture, unspecified site, subsequent encounter for fracture with routine healing: Secondary | ICD-10-CM | POA: Diagnosis not present

## 2019-05-31 DIAGNOSIS — R221 Localized swelling, mass and lump, neck: Secondary | ICD-10-CM | POA: Diagnosis not present

## 2019-05-31 DIAGNOSIS — S42251D Displaced fracture of greater tuberosity of right humerus, subsequent encounter for fracture with routine healing: Secondary | ICD-10-CM | POA: Diagnosis not present

## 2019-06-04 DIAGNOSIS — I10 Essential (primary) hypertension: Secondary | ICD-10-CM | POA: Diagnosis not present

## 2019-06-04 DIAGNOSIS — S42251D Displaced fracture of greater tuberosity of right humerus, subsequent encounter for fracture with routine healing: Secondary | ICD-10-CM | POA: Diagnosis not present

## 2019-06-04 DIAGNOSIS — D649 Anemia, unspecified: Secondary | ICD-10-CM | POA: Diagnosis not present

## 2019-06-04 DIAGNOSIS — E871 Hypo-osmolality and hyponatremia: Secondary | ICD-10-CM | POA: Diagnosis not present

## 2019-06-23 DIAGNOSIS — M8000XD Age-related osteoporosis with current pathological fracture, unspecified site, subsequent encounter for fracture with routine healing: Secondary | ICD-10-CM | POA: Diagnosis not present

## 2019-06-23 DIAGNOSIS — W19XXXA Unspecified fall, initial encounter: Secondary | ICD-10-CM | POA: Diagnosis not present

## 2019-06-23 DIAGNOSIS — M79651 Pain in right thigh: Secondary | ICD-10-CM | POA: Diagnosis not present

## 2019-06-23 DIAGNOSIS — M25551 Pain in right hip: Secondary | ICD-10-CM | POA: Diagnosis not present

## 2019-06-23 DIAGNOSIS — Z9181 History of falling: Secondary | ICD-10-CM | POA: Diagnosis not present

## 2019-06-23 DIAGNOSIS — M79604 Pain in right leg: Secondary | ICD-10-CM | POA: Diagnosis not present

## 2019-06-24 ENCOUNTER — Encounter (HOSPITAL_COMMUNITY): Payer: Self-pay

## 2019-06-24 ENCOUNTER — Ambulatory Visit (HOSPITAL_COMMUNITY): Payer: Medicare Other

## 2019-06-24 DIAGNOSIS — R2689 Other abnormalities of gait and mobility: Secondary | ICD-10-CM | POA: Diagnosis not present

## 2019-06-24 DIAGNOSIS — S42251D Displaced fracture of greater tuberosity of right humerus, subsequent encounter for fracture with routine healing: Secondary | ICD-10-CM | POA: Diagnosis not present

## 2019-06-24 DIAGNOSIS — M6281 Muscle weakness (generalized): Secondary | ICD-10-CM | POA: Diagnosis not present

## 2019-06-24 DIAGNOSIS — R279 Unspecified lack of coordination: Secondary | ICD-10-CM | POA: Diagnosis not present

## 2019-06-25 DIAGNOSIS — R279 Unspecified lack of coordination: Secondary | ICD-10-CM | POA: Diagnosis not present

## 2019-06-25 DIAGNOSIS — M6281 Muscle weakness (generalized): Secondary | ICD-10-CM | POA: Diagnosis not present

## 2019-06-25 DIAGNOSIS — R2689 Other abnormalities of gait and mobility: Secondary | ICD-10-CM | POA: Diagnosis not present

## 2019-06-25 DIAGNOSIS — S42251D Displaced fracture of greater tuberosity of right humerus, subsequent encounter for fracture with routine healing: Secondary | ICD-10-CM | POA: Diagnosis not present

## 2019-06-28 DIAGNOSIS — R2689 Other abnormalities of gait and mobility: Secondary | ICD-10-CM | POA: Diagnosis not present

## 2019-06-28 DIAGNOSIS — M6281 Muscle weakness (generalized): Secondary | ICD-10-CM | POA: Diagnosis not present

## 2019-06-28 DIAGNOSIS — S42251D Displaced fracture of greater tuberosity of right humerus, subsequent encounter for fracture with routine healing: Secondary | ICD-10-CM | POA: Diagnosis not present

## 2019-06-28 DIAGNOSIS — R279 Unspecified lack of coordination: Secondary | ICD-10-CM | POA: Diagnosis not present

## 2019-06-29 DIAGNOSIS — I10 Essential (primary) hypertension: Secondary | ICD-10-CM | POA: Diagnosis not present

## 2019-06-29 DIAGNOSIS — R279 Unspecified lack of coordination: Secondary | ICD-10-CM | POA: Diagnosis not present

## 2019-06-29 DIAGNOSIS — M79604 Pain in right leg: Secondary | ICD-10-CM | POA: Diagnosis not present

## 2019-06-29 DIAGNOSIS — M6281 Muscle weakness (generalized): Secondary | ICD-10-CM | POA: Diagnosis not present

## 2019-06-29 DIAGNOSIS — R2689 Other abnormalities of gait and mobility: Secondary | ICD-10-CM | POA: Diagnosis not present

## 2019-06-29 DIAGNOSIS — S42251D Displaced fracture of greater tuberosity of right humerus, subsequent encounter for fracture with routine healing: Secondary | ICD-10-CM | POA: Diagnosis not present

## 2019-06-29 DIAGNOSIS — D649 Anemia, unspecified: Secondary | ICD-10-CM | POA: Diagnosis not present

## 2019-06-30 DIAGNOSIS — M79651 Pain in right thigh: Secondary | ICD-10-CM | POA: Diagnosis not present

## 2019-06-30 DIAGNOSIS — W19XXXA Unspecified fall, initial encounter: Secondary | ICD-10-CM | POA: Diagnosis not present

## 2019-06-30 DIAGNOSIS — S42251D Displaced fracture of greater tuberosity of right humerus, subsequent encounter for fracture with routine healing: Secondary | ICD-10-CM | POA: Diagnosis not present

## 2019-06-30 DIAGNOSIS — R2689 Other abnormalities of gait and mobility: Secondary | ICD-10-CM | POA: Diagnosis not present

## 2019-06-30 DIAGNOSIS — M25551 Pain in right hip: Secondary | ICD-10-CM | POA: Diagnosis not present

## 2019-06-30 DIAGNOSIS — M79652 Pain in left thigh: Secondary | ICD-10-CM | POA: Diagnosis not present

## 2019-06-30 DIAGNOSIS — M6281 Muscle weakness (generalized): Secondary | ICD-10-CM | POA: Diagnosis not present

## 2019-06-30 DIAGNOSIS — R279 Unspecified lack of coordination: Secondary | ICD-10-CM | POA: Diagnosis not present

## 2019-06-30 DIAGNOSIS — M25552 Pain in left hip: Secondary | ICD-10-CM | POA: Diagnosis not present

## 2019-07-01 DIAGNOSIS — R2689 Other abnormalities of gait and mobility: Secondary | ICD-10-CM | POA: Diagnosis not present

## 2019-07-01 DIAGNOSIS — R279 Unspecified lack of coordination: Secondary | ICD-10-CM | POA: Diagnosis not present

## 2019-07-01 DIAGNOSIS — M6281 Muscle weakness (generalized): Secondary | ICD-10-CM | POA: Diagnosis not present

## 2019-07-01 DIAGNOSIS — S42251D Displaced fracture of greater tuberosity of right humerus, subsequent encounter for fracture with routine healing: Secondary | ICD-10-CM | POA: Diagnosis not present

## 2019-07-02 DIAGNOSIS — M79605 Pain in left leg: Secondary | ICD-10-CM | POA: Diagnosis not present

## 2019-07-02 DIAGNOSIS — S42251D Displaced fracture of greater tuberosity of right humerus, subsequent encounter for fracture with routine healing: Secondary | ICD-10-CM | POA: Diagnosis not present

## 2019-07-02 DIAGNOSIS — M6281 Muscle weakness (generalized): Secondary | ICD-10-CM | POA: Diagnosis not present

## 2019-07-02 DIAGNOSIS — M79604 Pain in right leg: Secondary | ICD-10-CM | POA: Diagnosis not present

## 2019-07-02 DIAGNOSIS — Z136 Encounter for screening for cardiovascular disorders: Secondary | ICD-10-CM | POA: Diagnosis not present

## 2019-07-02 DIAGNOSIS — G894 Chronic pain syndrome: Secondary | ICD-10-CM | POA: Diagnosis not present

## 2019-07-02 DIAGNOSIS — R279 Unspecified lack of coordination: Secondary | ICD-10-CM | POA: Diagnosis not present

## 2019-07-02 DIAGNOSIS — R609 Edema, unspecified: Secondary | ICD-10-CM | POA: Diagnosis not present

## 2019-07-02 DIAGNOSIS — R2689 Other abnormalities of gait and mobility: Secondary | ICD-10-CM | POA: Diagnosis not present

## 2019-07-04 DIAGNOSIS — S42251D Displaced fracture of greater tuberosity of right humerus, subsequent encounter for fracture with routine healing: Secondary | ICD-10-CM | POA: Diagnosis not present

## 2019-07-04 DIAGNOSIS — R2689 Other abnormalities of gait and mobility: Secondary | ICD-10-CM | POA: Diagnosis not present

## 2019-07-04 DIAGNOSIS — M6281 Muscle weakness (generalized): Secondary | ICD-10-CM | POA: Diagnosis not present

## 2019-07-04 DIAGNOSIS — R279 Unspecified lack of coordination: Secondary | ICD-10-CM | POA: Diagnosis not present

## 2019-07-05 DIAGNOSIS — E871 Hypo-osmolality and hyponatremia: Secondary | ICD-10-CM | POA: Diagnosis not present

## 2019-07-05 DIAGNOSIS — M79604 Pain in right leg: Secondary | ICD-10-CM | POA: Diagnosis not present

## 2019-07-05 DIAGNOSIS — S42251D Displaced fracture of greater tuberosity of right humerus, subsequent encounter for fracture with routine healing: Secondary | ICD-10-CM | POA: Diagnosis not present

## 2019-07-05 DIAGNOSIS — R279 Unspecified lack of coordination: Secondary | ICD-10-CM | POA: Diagnosis not present

## 2019-07-05 DIAGNOSIS — M79672 Pain in left foot: Secondary | ICD-10-CM | POA: Diagnosis not present

## 2019-07-05 DIAGNOSIS — R2689 Other abnormalities of gait and mobility: Secondary | ICD-10-CM | POA: Diagnosis not present

## 2019-07-05 DIAGNOSIS — M6281 Muscle weakness (generalized): Secondary | ICD-10-CM | POA: Diagnosis not present

## 2019-07-05 DIAGNOSIS — M79671 Pain in right foot: Secondary | ICD-10-CM | POA: Diagnosis not present

## 2019-07-05 DIAGNOSIS — U071 COVID-19: Secondary | ICD-10-CM | POA: Diagnosis not present

## 2019-07-06 DIAGNOSIS — M6281 Muscle weakness (generalized): Secondary | ICD-10-CM | POA: Diagnosis not present

## 2019-07-06 DIAGNOSIS — R2689 Other abnormalities of gait and mobility: Secondary | ICD-10-CM | POA: Diagnosis not present

## 2019-07-06 DIAGNOSIS — S42251D Displaced fracture of greater tuberosity of right humerus, subsequent encounter for fracture with routine healing: Secondary | ICD-10-CM | POA: Diagnosis not present

## 2019-07-06 DIAGNOSIS — R279 Unspecified lack of coordination: Secondary | ICD-10-CM | POA: Diagnosis not present

## 2019-07-07 DIAGNOSIS — D508 Other iron deficiency anemias: Secondary | ICD-10-CM | POA: Diagnosis not present

## 2019-07-07 DIAGNOSIS — S42251D Displaced fracture of greater tuberosity of right humerus, subsequent encounter for fracture with routine healing: Secondary | ICD-10-CM | POA: Diagnosis not present

## 2019-07-07 DIAGNOSIS — Z79899 Other long term (current) drug therapy: Secondary | ICD-10-CM | POA: Diagnosis not present

## 2019-07-07 DIAGNOSIS — M6281 Muscle weakness (generalized): Secondary | ICD-10-CM | POA: Diagnosis not present

## 2019-07-07 DIAGNOSIS — E119 Type 2 diabetes mellitus without complications: Secondary | ICD-10-CM | POA: Diagnosis not present

## 2019-07-07 DIAGNOSIS — R279 Unspecified lack of coordination: Secondary | ICD-10-CM | POA: Diagnosis not present

## 2019-07-07 DIAGNOSIS — R2689 Other abnormalities of gait and mobility: Secondary | ICD-10-CM | POA: Diagnosis not present

## 2019-07-07 DIAGNOSIS — D474 Osteomyelofibrosis: Secondary | ICD-10-CM | POA: Diagnosis not present

## 2019-07-07 DIAGNOSIS — M79605 Pain in left leg: Secondary | ICD-10-CM | POA: Diagnosis not present

## 2019-07-07 DIAGNOSIS — M79604 Pain in right leg: Secondary | ICD-10-CM | POA: Diagnosis not present

## 2019-07-07 DIAGNOSIS — E871 Hypo-osmolality and hyponatremia: Secondary | ICD-10-CM | POA: Diagnosis not present

## 2019-07-08 DIAGNOSIS — S42251D Displaced fracture of greater tuberosity of right humerus, subsequent encounter for fracture with routine healing: Secondary | ICD-10-CM | POA: Diagnosis not present

## 2019-07-08 DIAGNOSIS — R279 Unspecified lack of coordination: Secondary | ICD-10-CM | POA: Diagnosis not present

## 2019-07-08 DIAGNOSIS — R2689 Other abnormalities of gait and mobility: Secondary | ICD-10-CM | POA: Diagnosis not present

## 2019-07-08 DIAGNOSIS — M6281 Muscle weakness (generalized): Secondary | ICD-10-CM | POA: Diagnosis not present

## 2019-07-09 DIAGNOSIS — R279 Unspecified lack of coordination: Secondary | ICD-10-CM | POA: Diagnosis not present

## 2019-07-09 DIAGNOSIS — R2689 Other abnormalities of gait and mobility: Secondary | ICD-10-CM | POA: Diagnosis not present

## 2019-07-09 DIAGNOSIS — M79604 Pain in right leg: Secondary | ICD-10-CM | POA: Diagnosis not present

## 2019-07-09 DIAGNOSIS — M79605 Pain in left leg: Secondary | ICD-10-CM | POA: Diagnosis not present

## 2019-07-09 DIAGNOSIS — M6281 Muscle weakness (generalized): Secondary | ICD-10-CM | POA: Diagnosis not present

## 2019-07-09 DIAGNOSIS — S42251D Displaced fracture of greater tuberosity of right humerus, subsequent encounter for fracture with routine healing: Secondary | ICD-10-CM | POA: Diagnosis not present

## 2019-07-10 DIAGNOSIS — S42251D Displaced fracture of greater tuberosity of right humerus, subsequent encounter for fracture with routine healing: Secondary | ICD-10-CM | POA: Diagnosis not present

## 2019-07-10 DIAGNOSIS — R2689 Other abnormalities of gait and mobility: Secondary | ICD-10-CM | POA: Diagnosis not present

## 2019-07-10 DIAGNOSIS — R279 Unspecified lack of coordination: Secondary | ICD-10-CM | POA: Diagnosis not present

## 2019-07-10 DIAGNOSIS — M6281 Muscle weakness (generalized): Secondary | ICD-10-CM | POA: Diagnosis not present

## 2019-07-12 DIAGNOSIS — R279 Unspecified lack of coordination: Secondary | ICD-10-CM | POA: Diagnosis not present

## 2019-07-12 DIAGNOSIS — U071 COVID-19: Secondary | ICD-10-CM | POA: Diagnosis not present

## 2019-07-12 DIAGNOSIS — S42251D Displaced fracture of greater tuberosity of right humerus, subsequent encounter for fracture with routine healing: Secondary | ICD-10-CM | POA: Diagnosis not present

## 2019-07-12 DIAGNOSIS — R2689 Other abnormalities of gait and mobility: Secondary | ICD-10-CM | POA: Diagnosis not present

## 2019-07-12 DIAGNOSIS — M6281 Muscle weakness (generalized): Secondary | ICD-10-CM | POA: Diagnosis not present

## 2019-07-13 DIAGNOSIS — S42251D Displaced fracture of greater tuberosity of right humerus, subsequent encounter for fracture with routine healing: Secondary | ICD-10-CM | POA: Diagnosis not present

## 2019-07-13 DIAGNOSIS — M6281 Muscle weakness (generalized): Secondary | ICD-10-CM | POA: Diagnosis not present

## 2019-07-13 DIAGNOSIS — R279 Unspecified lack of coordination: Secondary | ICD-10-CM | POA: Diagnosis not present

## 2019-07-13 DIAGNOSIS — R2689 Other abnormalities of gait and mobility: Secondary | ICD-10-CM | POA: Diagnosis not present

## 2019-07-14 DIAGNOSIS — M6281 Muscle weakness (generalized): Secondary | ICD-10-CM | POA: Diagnosis not present

## 2019-07-14 DIAGNOSIS — R279 Unspecified lack of coordination: Secondary | ICD-10-CM | POA: Diagnosis not present

## 2019-07-14 DIAGNOSIS — R2689 Other abnormalities of gait and mobility: Secondary | ICD-10-CM | POA: Diagnosis not present

## 2019-07-14 DIAGNOSIS — S42251D Displaced fracture of greater tuberosity of right humerus, subsequent encounter for fracture with routine healing: Secondary | ICD-10-CM | POA: Diagnosis not present

## 2019-07-15 DIAGNOSIS — R279 Unspecified lack of coordination: Secondary | ICD-10-CM | POA: Diagnosis not present

## 2019-07-15 DIAGNOSIS — R2689 Other abnormalities of gait and mobility: Secondary | ICD-10-CM | POA: Diagnosis not present

## 2019-07-15 DIAGNOSIS — M6281 Muscle weakness (generalized): Secondary | ICD-10-CM | POA: Diagnosis not present

## 2019-07-15 DIAGNOSIS — S42251D Displaced fracture of greater tuberosity of right humerus, subsequent encounter for fracture with routine healing: Secondary | ICD-10-CM | POA: Diagnosis not present

## 2019-07-16 DIAGNOSIS — M6281 Muscle weakness (generalized): Secondary | ICD-10-CM | POA: Diagnosis not present

## 2019-07-16 DIAGNOSIS — S42251D Displaced fracture of greater tuberosity of right humerus, subsequent encounter for fracture with routine healing: Secondary | ICD-10-CM | POA: Diagnosis not present

## 2019-07-16 DIAGNOSIS — R2689 Other abnormalities of gait and mobility: Secondary | ICD-10-CM | POA: Diagnosis not present

## 2019-07-16 DIAGNOSIS — R279 Unspecified lack of coordination: Secondary | ICD-10-CM | POA: Diagnosis not present

## 2019-07-17 DIAGNOSIS — S42251D Displaced fracture of greater tuberosity of right humerus, subsequent encounter for fracture with routine healing: Secondary | ICD-10-CM | POA: Diagnosis not present

## 2019-07-17 DIAGNOSIS — M6281 Muscle weakness (generalized): Secondary | ICD-10-CM | POA: Diagnosis not present

## 2019-07-17 DIAGNOSIS — R2689 Other abnormalities of gait and mobility: Secondary | ICD-10-CM | POA: Diagnosis not present

## 2019-07-17 DIAGNOSIS — R279 Unspecified lack of coordination: Secondary | ICD-10-CM | POA: Diagnosis not present

## 2019-07-19 DIAGNOSIS — R2689 Other abnormalities of gait and mobility: Secondary | ICD-10-CM | POA: Diagnosis not present

## 2019-07-19 DIAGNOSIS — M6281 Muscle weakness (generalized): Secondary | ICD-10-CM | POA: Diagnosis not present

## 2019-07-19 DIAGNOSIS — R279 Unspecified lack of coordination: Secondary | ICD-10-CM | POA: Diagnosis not present

## 2019-07-19 DIAGNOSIS — S42251D Displaced fracture of greater tuberosity of right humerus, subsequent encounter for fracture with routine healing: Secondary | ICD-10-CM | POA: Diagnosis not present

## 2019-07-20 DIAGNOSIS — S42251D Displaced fracture of greater tuberosity of right humerus, subsequent encounter for fracture with routine healing: Secondary | ICD-10-CM | POA: Diagnosis not present

## 2019-07-20 DIAGNOSIS — R2689 Other abnormalities of gait and mobility: Secondary | ICD-10-CM | POA: Diagnosis not present

## 2019-07-20 DIAGNOSIS — M810 Age-related osteoporosis without current pathological fracture: Secondary | ICD-10-CM | POA: Diagnosis not present

## 2019-07-20 DIAGNOSIS — N3281 Overactive bladder: Secondary | ICD-10-CM | POA: Diagnosis not present

## 2019-07-20 DIAGNOSIS — R279 Unspecified lack of coordination: Secondary | ICD-10-CM | POA: Diagnosis not present

## 2019-07-20 DIAGNOSIS — S22080A Wedge compression fracture of T11-T12 vertebra, initial encounter for closed fracture: Secondary | ICD-10-CM | POA: Diagnosis not present

## 2019-07-20 DIAGNOSIS — K2970 Gastritis, unspecified, without bleeding: Secondary | ICD-10-CM | POA: Diagnosis not present

## 2019-07-20 DIAGNOSIS — M6281 Muscle weakness (generalized): Secondary | ICD-10-CM | POA: Diagnosis not present

## 2019-07-21 DIAGNOSIS — R2689 Other abnormalities of gait and mobility: Secondary | ICD-10-CM | POA: Diagnosis not present

## 2019-07-21 DIAGNOSIS — M6281 Muscle weakness (generalized): Secondary | ICD-10-CM | POA: Diagnosis not present

## 2019-07-21 DIAGNOSIS — R279 Unspecified lack of coordination: Secondary | ICD-10-CM | POA: Diagnosis not present

## 2019-07-21 DIAGNOSIS — S42251D Displaced fracture of greater tuberosity of right humerus, subsequent encounter for fracture with routine healing: Secondary | ICD-10-CM | POA: Diagnosis not present

## 2019-07-22 DIAGNOSIS — M6281 Muscle weakness (generalized): Secondary | ICD-10-CM | POA: Diagnosis not present

## 2019-07-22 DIAGNOSIS — R279 Unspecified lack of coordination: Secondary | ICD-10-CM | POA: Diagnosis not present

## 2019-07-22 DIAGNOSIS — S42251D Displaced fracture of greater tuberosity of right humerus, subsequent encounter for fracture with routine healing: Secondary | ICD-10-CM | POA: Diagnosis not present

## 2019-07-22 DIAGNOSIS — R2689 Other abnormalities of gait and mobility: Secondary | ICD-10-CM | POA: Diagnosis not present

## 2019-07-23 DIAGNOSIS — R2689 Other abnormalities of gait and mobility: Secondary | ICD-10-CM | POA: Diagnosis not present

## 2019-07-23 DIAGNOSIS — R279 Unspecified lack of coordination: Secondary | ICD-10-CM | POA: Diagnosis not present

## 2019-07-23 DIAGNOSIS — M6281 Muscle weakness (generalized): Secondary | ICD-10-CM | POA: Diagnosis not present

## 2019-07-23 DIAGNOSIS — S42251D Displaced fracture of greater tuberosity of right humerus, subsequent encounter for fracture with routine healing: Secondary | ICD-10-CM | POA: Diagnosis not present

## 2019-07-24 DIAGNOSIS — R279 Unspecified lack of coordination: Secondary | ICD-10-CM | POA: Diagnosis not present

## 2019-07-24 DIAGNOSIS — R2689 Other abnormalities of gait and mobility: Secondary | ICD-10-CM | POA: Diagnosis not present

## 2019-07-24 DIAGNOSIS — M6281 Muscle weakness (generalized): Secondary | ICD-10-CM | POA: Diagnosis not present

## 2019-07-24 DIAGNOSIS — S42251D Displaced fracture of greater tuberosity of right humerus, subsequent encounter for fracture with routine healing: Secondary | ICD-10-CM | POA: Diagnosis not present

## 2019-07-26 DIAGNOSIS — S42251D Displaced fracture of greater tuberosity of right humerus, subsequent encounter for fracture with routine healing: Secondary | ICD-10-CM | POA: Diagnosis not present

## 2019-07-26 DIAGNOSIS — R279 Unspecified lack of coordination: Secondary | ICD-10-CM | POA: Diagnosis not present

## 2019-07-26 DIAGNOSIS — M6281 Muscle weakness (generalized): Secondary | ICD-10-CM | POA: Diagnosis not present

## 2019-07-26 DIAGNOSIS — R2689 Other abnormalities of gait and mobility: Secondary | ICD-10-CM | POA: Diagnosis not present

## 2019-07-27 DIAGNOSIS — R2689 Other abnormalities of gait and mobility: Secondary | ICD-10-CM | POA: Diagnosis not present

## 2019-07-27 DIAGNOSIS — R279 Unspecified lack of coordination: Secondary | ICD-10-CM | POA: Diagnosis not present

## 2019-07-27 DIAGNOSIS — M6281 Muscle weakness (generalized): Secondary | ICD-10-CM | POA: Diagnosis not present

## 2019-07-27 DIAGNOSIS — S42251D Displaced fracture of greater tuberosity of right humerus, subsequent encounter for fracture with routine healing: Secondary | ICD-10-CM | POA: Diagnosis not present

## 2019-07-28 DIAGNOSIS — S42251D Displaced fracture of greater tuberosity of right humerus, subsequent encounter for fracture with routine healing: Secondary | ICD-10-CM | POA: Diagnosis not present

## 2019-07-28 DIAGNOSIS — R279 Unspecified lack of coordination: Secondary | ICD-10-CM | POA: Diagnosis not present

## 2019-07-28 DIAGNOSIS — M6281 Muscle weakness (generalized): Secondary | ICD-10-CM | POA: Diagnosis not present

## 2019-07-28 DIAGNOSIS — R2689 Other abnormalities of gait and mobility: Secondary | ICD-10-CM | POA: Diagnosis not present

## 2019-07-29 DIAGNOSIS — R2689 Other abnormalities of gait and mobility: Secondary | ICD-10-CM | POA: Diagnosis not present

## 2019-07-29 DIAGNOSIS — S42251D Displaced fracture of greater tuberosity of right humerus, subsequent encounter for fracture with routine healing: Secondary | ICD-10-CM | POA: Diagnosis not present

## 2019-07-29 DIAGNOSIS — M6281 Muscle weakness (generalized): Secondary | ICD-10-CM | POA: Diagnosis not present

## 2019-07-29 DIAGNOSIS — M79671 Pain in right foot: Secondary | ICD-10-CM | POA: Diagnosis not present

## 2019-07-29 DIAGNOSIS — R279 Unspecified lack of coordination: Secondary | ICD-10-CM | POA: Diagnosis not present

## 2019-07-29 DIAGNOSIS — D119 Benign neoplasm of major salivary gland, unspecified: Secondary | ICD-10-CM | POA: Diagnosis not present

## 2019-07-29 DIAGNOSIS — M79605 Pain in left leg: Secondary | ICD-10-CM | POA: Diagnosis not present

## 2019-07-30 DIAGNOSIS — S42251D Displaced fracture of greater tuberosity of right humerus, subsequent encounter for fracture with routine healing: Secondary | ICD-10-CM | POA: Diagnosis not present

## 2019-07-30 DIAGNOSIS — R279 Unspecified lack of coordination: Secondary | ICD-10-CM | POA: Diagnosis not present

## 2019-07-30 DIAGNOSIS — M6281 Muscle weakness (generalized): Secondary | ICD-10-CM | POA: Diagnosis not present

## 2019-07-30 DIAGNOSIS — R2689 Other abnormalities of gait and mobility: Secondary | ICD-10-CM | POA: Diagnosis not present

## 2019-07-31 DIAGNOSIS — S42251D Displaced fracture of greater tuberosity of right humerus, subsequent encounter for fracture with routine healing: Secondary | ICD-10-CM | POA: Diagnosis not present

## 2019-07-31 DIAGNOSIS — R279 Unspecified lack of coordination: Secondary | ICD-10-CM | POA: Diagnosis not present

## 2019-07-31 DIAGNOSIS — M6281 Muscle weakness (generalized): Secondary | ICD-10-CM | POA: Diagnosis not present

## 2019-07-31 DIAGNOSIS — R2689 Other abnormalities of gait and mobility: Secondary | ICD-10-CM | POA: Diagnosis not present

## 2019-08-02 DIAGNOSIS — S42251D Displaced fracture of greater tuberosity of right humerus, subsequent encounter for fracture with routine healing: Secondary | ICD-10-CM | POA: Diagnosis not present

## 2019-08-02 DIAGNOSIS — R279 Unspecified lack of coordination: Secondary | ICD-10-CM | POA: Diagnosis not present

## 2019-08-02 DIAGNOSIS — R7989 Other specified abnormal findings of blood chemistry: Secondary | ICD-10-CM | POA: Diagnosis not present

## 2019-08-02 DIAGNOSIS — R2689 Other abnormalities of gait and mobility: Secondary | ICD-10-CM | POA: Diagnosis not present

## 2019-08-02 DIAGNOSIS — M6281 Muscle weakness (generalized): Secondary | ICD-10-CM | POA: Diagnosis not present

## 2019-08-03 DIAGNOSIS — R2689 Other abnormalities of gait and mobility: Secondary | ICD-10-CM | POA: Diagnosis not present

## 2019-08-03 DIAGNOSIS — S42251D Displaced fracture of greater tuberosity of right humerus, subsequent encounter for fracture with routine healing: Secondary | ICD-10-CM | POA: Diagnosis not present

## 2019-08-03 DIAGNOSIS — M6281 Muscle weakness (generalized): Secondary | ICD-10-CM | POA: Diagnosis not present

## 2019-08-03 DIAGNOSIS — R279 Unspecified lack of coordination: Secondary | ICD-10-CM | POA: Diagnosis not present

## 2019-08-04 DIAGNOSIS — R279 Unspecified lack of coordination: Secondary | ICD-10-CM | POA: Diagnosis not present

## 2019-08-04 DIAGNOSIS — S42251D Displaced fracture of greater tuberosity of right humerus, subsequent encounter for fracture with routine healing: Secondary | ICD-10-CM | POA: Diagnosis not present

## 2019-08-04 DIAGNOSIS — M6281 Muscle weakness (generalized): Secondary | ICD-10-CM | POA: Diagnosis not present

## 2019-08-04 DIAGNOSIS — R2689 Other abnormalities of gait and mobility: Secondary | ICD-10-CM | POA: Diagnosis not present

## 2019-08-05 DIAGNOSIS — M6281 Muscle weakness (generalized): Secondary | ICD-10-CM | POA: Diagnosis not present

## 2019-08-05 DIAGNOSIS — S42251D Displaced fracture of greater tuberosity of right humerus, subsequent encounter for fracture with routine healing: Secondary | ICD-10-CM | POA: Diagnosis not present

## 2019-08-05 DIAGNOSIS — H6123 Impacted cerumen, bilateral: Secondary | ICD-10-CM | POA: Diagnosis not present

## 2019-08-05 DIAGNOSIS — R279 Unspecified lack of coordination: Secondary | ICD-10-CM | POA: Diagnosis not present

## 2019-08-05 DIAGNOSIS — R2689 Other abnormalities of gait and mobility: Secondary | ICD-10-CM | POA: Diagnosis not present

## 2019-08-06 ENCOUNTER — Other Ambulatory Visit: Payer: Self-pay

## 2019-08-06 DIAGNOSIS — R279 Unspecified lack of coordination: Secondary | ICD-10-CM | POA: Diagnosis not present

## 2019-08-06 DIAGNOSIS — S42251D Displaced fracture of greater tuberosity of right humerus, subsequent encounter for fracture with routine healing: Secondary | ICD-10-CM | POA: Diagnosis not present

## 2019-08-06 DIAGNOSIS — M6281 Muscle weakness (generalized): Secondary | ICD-10-CM | POA: Diagnosis not present

## 2019-08-06 DIAGNOSIS — R2689 Other abnormalities of gait and mobility: Secondary | ICD-10-CM | POA: Diagnosis not present

## 2019-08-06 NOTE — Patient Outreach (Signed)
[facility]) Care Management  08/06/2019  Barbara Dennis 13-Sep-1934 [id number]   Medication Adherence call to [name] Telephone call to Patient regarding Medication Adherence unable to reach patient. [name] did not answer patient is past due on Lisinopril 40 mg under [place].   [place] Management Direct Dial [phone]  Fax [phone] [email or web address]

## 2019-08-07 DIAGNOSIS — M6281 Muscle weakness (generalized): Secondary | ICD-10-CM | POA: Diagnosis not present

## 2019-08-07 DIAGNOSIS — S42251D Displaced fracture of greater tuberosity of right humerus, subsequent encounter for fracture with routine healing: Secondary | ICD-10-CM | POA: Diagnosis not present

## 2019-08-07 DIAGNOSIS — R279 Unspecified lack of coordination: Secondary | ICD-10-CM | POA: Diagnosis not present

## 2019-08-07 DIAGNOSIS — R2689 Other abnormalities of gait and mobility: Secondary | ICD-10-CM | POA: Diagnosis not present

## 2019-08-09 ENCOUNTER — Other Ambulatory Visit: Payer: Self-pay

## 2019-08-09 ENCOUNTER — Ambulatory Visit (INDEPENDENT_AMBULATORY_CARE_PROVIDER_SITE_OTHER): Payer: Medicare Other | Admitting: Endocrinology

## 2019-08-09 ENCOUNTER — Encounter: Payer: Self-pay | Admitting: Endocrinology

## 2019-08-09 VITALS — BP 116/70 | HR 86 | Ht 60.0 in | Wt 79.8 lb

## 2019-08-09 DIAGNOSIS — R2689 Other abnormalities of gait and mobility: Secondary | ICD-10-CM | POA: Diagnosis not present

## 2019-08-09 DIAGNOSIS — M6281 Muscle weakness (generalized): Secondary | ICD-10-CM | POA: Diagnosis not present

## 2019-08-09 DIAGNOSIS — R413 Other amnesia: Secondary | ICD-10-CM

## 2019-08-09 DIAGNOSIS — M810 Age-related osteoporosis without current pathological fracture: Secondary | ICD-10-CM

## 2019-08-09 DIAGNOSIS — R279 Unspecified lack of coordination: Secondary | ICD-10-CM | POA: Diagnosis not present

## 2019-08-09 DIAGNOSIS — E0520 Thyrotoxicosis with toxic multinodular goiter without thyrotoxic crisis or storm: Secondary | ICD-10-CM

## 2019-08-09 DIAGNOSIS — S42251D Displaced fracture of greater tuberosity of right humerus, subsequent encounter for fracture with routine healing: Secondary | ICD-10-CM | POA: Diagnosis not present

## 2019-08-09 MED ORDER — METHIMAZOLE 5 MG PO TABS: 2.5000 mg | ORAL_TABLET | ORAL | 11 refills | Status: AC

## 2019-08-09 NOTE — Patient Instructions (Addendum)
It is critically important to prevent falling down (keep floor areas well-lit, dry, and free of loose objects.  If you have a cane, walker, or wheelchair, you should use it, even for short trips around the house.  Wear flat-soled shoes.  Also, try not to rush) Please continue calcium 1200 mg per day, and vitamin-D, 400 units per day.   I have sent a prescription to your pharmacy, to slow the thyroid. Please continue the same Ibandronate.  Please recheck the bone density next month.   Please come back for a follow-up appointment in 6 weeks.

## 2019-08-09 NOTE — Progress Notes (Signed)
Subjective:    Patient ID: Barbara Dennis, female    DOB: 1933-10-24, 85 y.o.   MRN: [id number]  HPI  Pt returns for f/u of osteoporosis:  Dx'ed: 2019 Secondary cause: smoking (quit 2019) Fractures: ribs (2019), with a fall Past rx: none Current rx: Boniva.   Last DEXA result: (2019) worst T-score is -4.5 (L-spine).   Other: ins declined Reclast.   Interval hx: She tolerates boniva well.  Pt also has has MNG (dx'ed 2018): [place] then showed none of the nodules met criteria for biopsy or follow-up.  She has moderate memory loss, but no assoc neck pain.  Denies palpitations.  Past Medical History:  Diagnosis Date  . Anemia of chronic disease   . GERD (gastroesophageal reflux disease)   . Hypertension   . Mass of right side of neck   . Osteoporosis   . Spinal stenosis   . Tobacco abuse   . Urinary incontinence     Past Surgical History:  Procedure Laterality Date  . APPENDECTOMY    . BIOPSY  08/24/2018   Procedure: BIOPSY;  Surgeon: Thornton Park, MD;  Location: [facility];  Service: Gastroenterology;;  . ESOPHAGOGASTRODUODENOSCOPY (EGD) WITH PROPOFOL N/A 08/24/2018   Procedure: ESOPHAGOGASTRODUODENOSCOPY (EGD) WITH PROPOFOL;  Surgeon: Thornton Park, MD;  Location: [facility];  Service: Gastroenterology;  Laterality: N/A;    Social History   Socioeconomic History  . Marital status: Widowed    Spouse name: Not on file  . Number of children: Not on file  . Years of education: Not on file  . Highest education level: Not on file  Occupational History  . Not on file  Social Needs  . Financial resource strain: Not on file  . Food insecurity    Worry: Not on file    Inability: Not on file  . Transportation needs    Medical: Not on file    Non-medical: Not on file  Tobacco Use  . Smoking status: Current Every Day Smoker    Packs/day: 0.70    Years: 60.00    Pack years: 42.00    Types: Cigarettes  . Smokeless tobacco: Never Used  Substance and Sexual Activity   . Alcohol use: No    Alcohol/week: 0.0 standard drinks  . Drug use: No  . Sexual activity: Not on file  Lifestyle  . Physical activity    Days per week: Not on file    Minutes per session: Not on file  . Stress: Not on file  Relationships  . Social Herbalist on phone: Not on file    Gets together: Not on file    Attends religious service: Not on file    Active member of club or organization: Not on file    Attends meetings of clubs or organizations: Not on file    Relationship status: Not on file  . Intimate partner violence    Fear of current or ex partner: Not on file    Emotionally abused: Not on file    Physically abused: Not on file    Forced sexual activity: Not on file  Other Topics Concern  . Not on file  Social History Narrative   Marital status: widowed 28 years ago.  From [place]; moved to [place] 1974.     Children: none       Lives:  Alone; brother in law is Psychologist, sport and exercise who is 60.      Employment: retired.       Tobacco:  1/2 ppd       Alcohol:  None      Exercise:  Walking daily.      ADLs: no driving.    Current Outpatient Medications on File Prior to Visit  Medication Sig Dispense Refill  . acetaminophen (TYLENOL) 325 MG tablet Take 2 tablets (650 mg total) by mouth every 6 (six) hours as needed for mild pain, fever or headache.    Marland Kitchen amLODipine (NORVASC) 10 MG tablet Take 1 tablet (10 mg total) by mouth daily.    . cholecalciferol (VITAMIN D3) 25 MCG (1000 UT) tablet Take 1,000 Units by mouth daily.    . famotidine (PEPCID) 40 MG tablet Take 40 mg by mouth daily.    Marland Kitchen gabapentin (NEURONTIN) 100 MG capsule Take 100 mg by mouth at bedtime.    . ibandronate (BONIVA) 150 MG tablet Take 1 tablet (150 mg total) by mouth every 30 (thirty) days. 3 tablet 3  . Multiple Vitamin (MULTIVITAMIN WITH MINERALS) TABS tablet Take 1 tablet by mouth daily. 30 tablet 0  . polyethylene glycol (MIRALAX / GLYCOLAX) 17 g packet Take 17 g by mouth daily. 14 each 0  . ramelteon  (ROZEREM) 8 MG tablet Take 1 tablet (8 mg total) by mouth at bedtime.    . senna (SENOKOT) 8.6 MG TABS tablet Take 1 tablet (8.6 mg total) by mouth daily as needed for mild constipation. 120 tablet 0  . traMADol (ULTRAM) 50 MG tablet Take by mouth every 4 (four) hours as needed.     No current facility-administered medications on file prior to visit.     No Known Allergies  Family History  Problem Relation Age of Onset  . Hypercalcemia Neg Hx   . Osteoporosis Neg Hx     BP 116/70 (BP Location: Left Arm, Patient Position: Sitting, Cuff Size: Small)   Pulse 86   Ht 5' (1.524 m)   Wt 79 lb 12.8 oz (36.2 kg)   SpO2 100%   BMI 15.58 kg/m   Review of Systems Denies tremor and falls.     Objective:   Physical Exam VITAL SIGNS:  See vs page GENERAL: no distress.  In wheelchair NECK: thyroid is not enlarged, but it has irreg surface.   PSYCH: oriented to self and  only.     Lab Results  Component Value Date   TSH 0.259 (L) 05/24/2019   T4TOTAL 8.1 01/03/2015       Assessment & Plan:  Hyperthyroidism, new.  Due to MNG Osteoporosis, due for receheck Memory loss: she is at risk for falls  Patient Instructions  It is critically important to prevent falling down (keep floor areas well-lit, dry, and free of loose objects.  If you have a cane, walker, or wheelchair, you should use it, even for short trips around the house.  Wear flat-soled shoes.  Also, try not to rush) Please continue calcium 1200 mg per day, and vitamin-D, 400 units per day.   I have sent a prescription to your pharmacy, to slow the thyroid. Please continue the same Ibandronate.  Please recheck the bone density next month.   Please come back for a follow-up appointment in 6 weeks.

## 2019-08-10 DIAGNOSIS — D649 Anemia, unspecified: Secondary | ICD-10-CM | POA: Diagnosis not present

## 2019-08-10 DIAGNOSIS — Z79899 Other long term (current) drug therapy: Secondary | ICD-10-CM | POA: Diagnosis not present

## 2019-08-10 DIAGNOSIS — R2689 Other abnormalities of gait and mobility: Secondary | ICD-10-CM | POA: Diagnosis not present

## 2019-08-10 DIAGNOSIS — R279 Unspecified lack of coordination: Secondary | ICD-10-CM | POA: Diagnosis not present

## 2019-08-10 DIAGNOSIS — R7989 Other specified abnormal findings of blood chemistry: Secondary | ICD-10-CM | POA: Diagnosis not present

## 2019-08-10 DIAGNOSIS — S42251D Displaced fracture of greater tuberosity of right humerus, subsequent encounter for fracture with routine healing: Secondary | ICD-10-CM | POA: Diagnosis not present

## 2019-08-10 DIAGNOSIS — M6281 Muscle weakness (generalized): Secondary | ICD-10-CM | POA: Diagnosis not present

## 2019-08-11 DIAGNOSIS — M6281 Muscle weakness (generalized): Secondary | ICD-10-CM | POA: Diagnosis not present

## 2019-08-11 DIAGNOSIS — D649 Anemia, unspecified: Secondary | ICD-10-CM | POA: Diagnosis not present

## 2019-08-11 DIAGNOSIS — S42251D Displaced fracture of greater tuberosity of right humerus, subsequent encounter for fracture with routine healing: Secondary | ICD-10-CM | POA: Diagnosis not present

## 2019-08-11 DIAGNOSIS — R279 Unspecified lack of coordination: Secondary | ICD-10-CM | POA: Diagnosis not present

## 2019-08-11 DIAGNOSIS — R2689 Other abnormalities of gait and mobility: Secondary | ICD-10-CM | POA: Diagnosis not present

## 2019-08-12 DIAGNOSIS — S42251D Displaced fracture of greater tuberosity of right humerus, subsequent encounter for fracture with routine healing: Secondary | ICD-10-CM | POA: Diagnosis not present

## 2019-08-12 DIAGNOSIS — R2689 Other abnormalities of gait and mobility: Secondary | ICD-10-CM | POA: Diagnosis not present

## 2019-08-12 DIAGNOSIS — R279 Unspecified lack of coordination: Secondary | ICD-10-CM | POA: Diagnosis not present

## 2019-08-12 DIAGNOSIS — M6281 Muscle weakness (generalized): Secondary | ICD-10-CM | POA: Diagnosis not present

## 2019-08-13 DIAGNOSIS — M6281 Muscle weakness (generalized): Secondary | ICD-10-CM | POA: Diagnosis not present

## 2019-08-13 DIAGNOSIS — S42251D Displaced fracture of greater tuberosity of right humerus, subsequent encounter for fracture with routine healing: Secondary | ICD-10-CM | POA: Diagnosis not present

## 2019-08-13 DIAGNOSIS — R279 Unspecified lack of coordination: Secondary | ICD-10-CM | POA: Diagnosis not present

## 2019-08-13 DIAGNOSIS — R2689 Other abnormalities of gait and mobility: Secondary | ICD-10-CM | POA: Diagnosis not present

## 2019-08-14 DIAGNOSIS — R2689 Other abnormalities of gait and mobility: Secondary | ICD-10-CM | POA: Diagnosis not present

## 2019-08-14 DIAGNOSIS — M6281 Muscle weakness (generalized): Secondary | ICD-10-CM | POA: Diagnosis not present

## 2019-08-14 DIAGNOSIS — S42251D Displaced fracture of greater tuberosity of right humerus, subsequent encounter for fracture with routine healing: Secondary | ICD-10-CM | POA: Diagnosis not present

## 2019-08-14 DIAGNOSIS — R279 Unspecified lack of coordination: Secondary | ICD-10-CM | POA: Diagnosis not present

## 2019-08-15 DIAGNOSIS — M6281 Muscle weakness (generalized): Secondary | ICD-10-CM | POA: Diagnosis not present

## 2019-08-15 DIAGNOSIS — R2689 Other abnormalities of gait and mobility: Secondary | ICD-10-CM | POA: Diagnosis not present

## 2019-08-15 DIAGNOSIS — R279 Unspecified lack of coordination: Secondary | ICD-10-CM | POA: Diagnosis not present

## 2019-08-15 DIAGNOSIS — S42251D Displaced fracture of greater tuberosity of right humerus, subsequent encounter for fracture with routine healing: Secondary | ICD-10-CM | POA: Diagnosis not present

## 2019-08-16 DIAGNOSIS — R279 Unspecified lack of coordination: Secondary | ICD-10-CM | POA: Diagnosis not present

## 2019-08-16 DIAGNOSIS — S42251D Displaced fracture of greater tuberosity of right humerus, subsequent encounter for fracture with routine healing: Secondary | ICD-10-CM | POA: Diagnosis not present

## 2019-08-16 DIAGNOSIS — D508 Other iron deficiency anemias: Secondary | ICD-10-CM | POA: Diagnosis not present

## 2019-08-16 DIAGNOSIS — M6281 Muscle weakness (generalized): Secondary | ICD-10-CM | POA: Diagnosis not present

## 2019-08-16 DIAGNOSIS — R2689 Other abnormalities of gait and mobility: Secondary | ICD-10-CM | POA: Diagnosis not present

## 2019-08-17 DIAGNOSIS — R2689 Other abnormalities of gait and mobility: Secondary | ICD-10-CM | POA: Diagnosis not present

## 2019-08-17 DIAGNOSIS — D649 Anemia, unspecified: Secondary | ICD-10-CM | POA: Diagnosis not present

## 2019-08-17 DIAGNOSIS — K219 Gastro-esophageal reflux disease without esophagitis: Secondary | ICD-10-CM | POA: Diagnosis not present

## 2019-08-17 DIAGNOSIS — M6281 Muscle weakness (generalized): Secondary | ICD-10-CM | POA: Diagnosis not present

## 2019-08-17 DIAGNOSIS — S42251D Displaced fracture of greater tuberosity of right humerus, subsequent encounter for fracture with routine healing: Secondary | ICD-10-CM | POA: Diagnosis not present

## 2019-08-17 DIAGNOSIS — K2970 Gastritis, unspecified, without bleeding: Secondary | ICD-10-CM | POA: Diagnosis not present

## 2019-08-17 DIAGNOSIS — R279 Unspecified lack of coordination: Secondary | ICD-10-CM | POA: Diagnosis not present

## 2019-08-18 DIAGNOSIS — M6281 Muscle weakness (generalized): Secondary | ICD-10-CM | POA: Diagnosis not present

## 2019-08-18 DIAGNOSIS — S42251D Displaced fracture of greater tuberosity of right humerus, subsequent encounter for fracture with routine healing: Secondary | ICD-10-CM | POA: Diagnosis not present

## 2019-08-18 DIAGNOSIS — R279 Unspecified lack of coordination: Secondary | ICD-10-CM | POA: Diagnosis not present

## 2019-08-18 DIAGNOSIS — R2689 Other abnormalities of gait and mobility: Secondary | ICD-10-CM | POA: Diagnosis not present

## 2019-08-19 DIAGNOSIS — K2970 Gastritis, unspecified, without bleeding: Secondary | ICD-10-CM | POA: Diagnosis not present

## 2019-08-19 DIAGNOSIS — M810 Age-related osteoporosis without current pathological fracture: Secondary | ICD-10-CM | POA: Diagnosis not present

## 2019-08-19 DIAGNOSIS — M6281 Muscle weakness (generalized): Secondary | ICD-10-CM | POA: Diagnosis not present

## 2019-08-19 DIAGNOSIS — N3281 Overactive bladder: Secondary | ICD-10-CM | POA: Diagnosis not present

## 2019-08-19 DIAGNOSIS — S22080A Wedge compression fracture of T11-T12 vertebra, initial encounter for closed fracture: Secondary | ICD-10-CM | POA: Diagnosis not present

## 2019-08-19 DIAGNOSIS — R2689 Other abnormalities of gait and mobility: Secondary | ICD-10-CM | POA: Diagnosis not present

## 2019-08-19 DIAGNOSIS — S42251D Displaced fracture of greater tuberosity of right humerus, subsequent encounter for fracture with routine healing: Secondary | ICD-10-CM | POA: Diagnosis not present

## 2019-08-19 DIAGNOSIS — R279 Unspecified lack of coordination: Secondary | ICD-10-CM | POA: Diagnosis not present

## 2019-08-20 DIAGNOSIS — R2689 Other abnormalities of gait and mobility: Secondary | ICD-10-CM | POA: Diagnosis not present

## 2019-08-20 DIAGNOSIS — R279 Unspecified lack of coordination: Secondary | ICD-10-CM | POA: Diagnosis not present

## 2019-08-20 DIAGNOSIS — M6281 Muscle weakness (generalized): Secondary | ICD-10-CM | POA: Diagnosis not present

## 2019-08-20 DIAGNOSIS — S42251D Displaced fracture of greater tuberosity of right humerus, subsequent encounter for fracture with routine healing: Secondary | ICD-10-CM | POA: Diagnosis not present

## 2019-08-21 DIAGNOSIS — M6281 Muscle weakness (generalized): Secondary | ICD-10-CM | POA: Diagnosis not present

## 2019-08-21 DIAGNOSIS — R2689 Other abnormalities of gait and mobility: Secondary | ICD-10-CM | POA: Diagnosis not present

## 2019-08-21 DIAGNOSIS — R279 Unspecified lack of coordination: Secondary | ICD-10-CM | POA: Diagnosis not present

## 2019-08-21 DIAGNOSIS — S42251D Displaced fracture of greater tuberosity of right humerus, subsequent encounter for fracture with routine healing: Secondary | ICD-10-CM | POA: Diagnosis not present

## 2019-08-22 DIAGNOSIS — U071 COVID-19: Secondary | ICD-10-CM | POA: Diagnosis not present

## 2019-08-23 DIAGNOSIS — R279 Unspecified lack of coordination: Secondary | ICD-10-CM | POA: Diagnosis not present

## 2019-08-23 DIAGNOSIS — R2689 Other abnormalities of gait and mobility: Secondary | ICD-10-CM | POA: Diagnosis not present

## 2019-08-23 DIAGNOSIS — M6281 Muscle weakness (generalized): Secondary | ICD-10-CM | POA: Diagnosis not present

## 2019-08-23 DIAGNOSIS — S42251D Displaced fracture of greater tuberosity of right humerus, subsequent encounter for fracture with routine healing: Secondary | ICD-10-CM | POA: Diagnosis not present

## 2019-08-24 DIAGNOSIS — M8000XD Age-related osteoporosis with current pathological fracture, unspecified site, subsequent encounter for fracture with routine healing: Secondary | ICD-10-CM | POA: Diagnosis not present

## 2019-08-24 DIAGNOSIS — S42251D Displaced fracture of greater tuberosity of right humerus, subsequent encounter for fracture with routine healing: Secondary | ICD-10-CM | POA: Diagnosis not present

## 2019-08-24 DIAGNOSIS — M79604 Pain in right leg: Secondary | ICD-10-CM | POA: Diagnosis not present

## 2019-08-24 DIAGNOSIS — M6281 Muscle weakness (generalized): Secondary | ICD-10-CM | POA: Diagnosis not present

## 2019-08-24 DIAGNOSIS — R279 Unspecified lack of coordination: Secondary | ICD-10-CM | POA: Diagnosis not present

## 2019-08-24 DIAGNOSIS — R2689 Other abnormalities of gait and mobility: Secondary | ICD-10-CM | POA: Diagnosis not present

## 2019-08-24 DIAGNOSIS — D649 Anemia, unspecified: Secondary | ICD-10-CM | POA: Diagnosis not present

## 2019-08-25 DIAGNOSIS — M6281 Muscle weakness (generalized): Secondary | ICD-10-CM | POA: Diagnosis not present

## 2019-08-25 DIAGNOSIS — R279 Unspecified lack of coordination: Secondary | ICD-10-CM | POA: Diagnosis not present

## 2019-08-25 DIAGNOSIS — S42251D Displaced fracture of greater tuberosity of right humerus, subsequent encounter for fracture with routine healing: Secondary | ICD-10-CM | POA: Diagnosis not present

## 2019-08-25 DIAGNOSIS — R2689 Other abnormalities of gait and mobility: Secondary | ICD-10-CM | POA: Diagnosis not present

## 2019-08-26 DIAGNOSIS — R2689 Other abnormalities of gait and mobility: Secondary | ICD-10-CM | POA: Diagnosis not present

## 2019-08-26 DIAGNOSIS — M6281 Muscle weakness (generalized): Secondary | ICD-10-CM | POA: Diagnosis not present

## 2019-08-26 DIAGNOSIS — R279 Unspecified lack of coordination: Secondary | ICD-10-CM | POA: Diagnosis not present

## 2019-08-26 DIAGNOSIS — S42251D Displaced fracture of greater tuberosity of right humerus, subsequent encounter for fracture with routine healing: Secondary | ICD-10-CM | POA: Diagnosis not present

## 2019-08-27 ENCOUNTER — Telehealth: Payer: Self-pay | Admitting: Hematology and Oncology

## 2019-08-27 DIAGNOSIS — R279 Unspecified lack of coordination: Secondary | ICD-10-CM | POA: Diagnosis not present

## 2019-08-27 DIAGNOSIS — R2689 Other abnormalities of gait and mobility: Secondary | ICD-10-CM | POA: Diagnosis not present

## 2019-08-27 DIAGNOSIS — M6281 Muscle weakness (generalized): Secondary | ICD-10-CM | POA: Diagnosis not present

## 2019-08-27 DIAGNOSIS — S42251D Displaced fracture of greater tuberosity of right humerus, subsequent encounter for fracture with routine healing: Secondary | ICD-10-CM | POA: Diagnosis not present

## 2019-08-27 NOTE — Telephone Encounter (Signed)
Received a new patient referral from [facility] for Ms. Machado to be seen for anemia. I cld and spoke to Diane to schedule Ms. Cadman to see Dr. Lorenso Courier on 11/13 at 2pm. Aware the pt should 15 minutes early.

## 2019-08-28 ENCOUNTER — Other Ambulatory Visit: Payer: Self-pay | Admitting: Gastroenterology

## 2019-08-28 DIAGNOSIS — R1084 Generalized abdominal pain: Secondary | ICD-10-CM

## 2019-08-28 NOTE — Telephone Encounter (Signed)
Barbara Dennis, She may have a refill to get her through until a follow-up appointment with me or Janett Billow. She was last seen in 08/2018. Thanks.

## 2019-08-29 DIAGNOSIS — U071 COVID-19: Secondary | ICD-10-CM | POA: Diagnosis not present

## 2019-08-30 ENCOUNTER — Other Ambulatory Visit: Payer: Self-pay | Admitting: Gastroenterology

## 2019-08-30 ENCOUNTER — Encounter: Payer: Self-pay | Admitting: *Deleted

## 2019-08-30 DIAGNOSIS — M6281 Muscle weakness (generalized): Secondary | ICD-10-CM | POA: Diagnosis not present

## 2019-08-30 DIAGNOSIS — R1084 Generalized abdominal pain: Secondary | ICD-10-CM

## 2019-08-30 DIAGNOSIS — R279 Unspecified lack of coordination: Secondary | ICD-10-CM | POA: Diagnosis not present

## 2019-08-30 DIAGNOSIS — S42251D Displaced fracture of greater tuberosity of right humerus, subsequent encounter for fracture with routine healing: Secondary | ICD-10-CM | POA: Diagnosis not present

## 2019-08-30 DIAGNOSIS — R2689 Other abnormalities of gait and mobility: Secondary | ICD-10-CM | POA: Diagnosis not present

## 2019-08-30 NOTE — Telephone Encounter (Addendum)
Called the patient's phone twice and home phone once with a [place] interpreter on the line Gwenlyn Perking [phone]). VM not set up, home phone no one answered.   Patient placed on schedule with Dr. Tarri Glenn on 12/9at 10:30 am. Will send in 30 day prescription. Letter sent to patient explaining the need to come in for her office visit to be prescribed more Omeprazole.

## 2019-08-31 DIAGNOSIS — R2689 Other abnormalities of gait and mobility: Secondary | ICD-10-CM | POA: Diagnosis not present

## 2019-08-31 DIAGNOSIS — S42251D Displaced fracture of greater tuberosity of right humerus, subsequent encounter for fracture with routine healing: Secondary | ICD-10-CM | POA: Diagnosis not present

## 2019-08-31 DIAGNOSIS — M6281 Muscle weakness (generalized): Secondary | ICD-10-CM | POA: Diagnosis not present

## 2019-08-31 DIAGNOSIS — D649 Anemia, unspecified: Secondary | ICD-10-CM | POA: Diagnosis not present

## 2019-08-31 DIAGNOSIS — R279 Unspecified lack of coordination: Secondary | ICD-10-CM | POA: Diagnosis not present

## 2019-09-01 DIAGNOSIS — R2689 Other abnormalities of gait and mobility: Secondary | ICD-10-CM | POA: Diagnosis not present

## 2019-09-01 DIAGNOSIS — R279 Unspecified lack of coordination: Secondary | ICD-10-CM | POA: Diagnosis not present

## 2019-09-01 DIAGNOSIS — S42251D Displaced fracture of greater tuberosity of right humerus, subsequent encounter for fracture with routine healing: Secondary | ICD-10-CM | POA: Diagnosis not present

## 2019-09-01 DIAGNOSIS — M6281 Muscle weakness (generalized): Secondary | ICD-10-CM | POA: Diagnosis not present

## 2019-09-02 DIAGNOSIS — R279 Unspecified lack of coordination: Secondary | ICD-10-CM | POA: Diagnosis not present

## 2019-09-02 DIAGNOSIS — M6281 Muscle weakness (generalized): Secondary | ICD-10-CM | POA: Diagnosis not present

## 2019-09-02 DIAGNOSIS — R2689 Other abnormalities of gait and mobility: Secondary | ICD-10-CM | POA: Diagnosis not present

## 2019-09-02 DIAGNOSIS — S42251D Displaced fracture of greater tuberosity of right humerus, subsequent encounter for fracture with routine healing: Secondary | ICD-10-CM | POA: Diagnosis not present

## 2019-09-02 NOTE — Progress Notes (Deleted)
[facility] [phone]   Fax:([phone]) [place] NOTE  Patient Care Team: Horald Pollen, MD as PCP - General (Internal Medicine) Melida Quitter, MD as Consulting Physician (Otolaryngology)  Hematological/Oncological History # Anemia 1) 2) 08/16/2019: Hgb 8.6  CHIEF COMPLAINTS/PURPOSE OF CONSULTATION:  Evaluation of anemia  HISTORY OF PRESENTING ILLNESS:  Barbara Dennis 85 y.o. female with medical history significant for HTN, GERD, hypothyroidism and osteoporosis who presents today as a new patient evaluation for anemia.   On review of prior records Mrs. Winegar resides at [facility] in [place], [place]. Her last lab records show a Hgb 8.6 ***  MEDICAL HISTORY:  Past Medical History:  Diagnosis Date  . Anemia of chronic disease   . GERD (gastroesophageal reflux disease)   . Hypertension   . Mass of right side of neck   . Osteoporosis   . Spinal stenosis   . Tobacco abuse   . Urinary incontinence     SURGICAL HISTORY: Past Surgical History:  Procedure Laterality Date  . APPENDECTOMY    . BIOPSY  08/24/2018   Procedure: BIOPSY;  Surgeon: Thornton Park, MD;  Location: [facility];  Service: Gastroenterology;;  . ESOPHAGOGASTRODUODENOSCOPY (EGD) WITH PROPOFOL N/A 08/24/2018   Procedure: ESOPHAGOGASTRODUODENOSCOPY (EGD) WITH PROPOFOL;  Surgeon: Thornton Park, MD;  Location: [facility];  Service: Gastroenterology;  Laterality: N/A;    SOCIAL HISTORY: Social History   Socioeconomic History  . Marital status: Widowed    Spouse name: Not on file  . Number of children: Not on file  . Years of education: Not on file  . Highest education level: Not on file  Occupational History  . Not on file  Social Needs  . Financial resource strain: Not on file  . Food insecurity    Worry: Not on file    Inability: Not on file  . Transportation needs    Medical: Not on file    Non-medical: Not on file   Tobacco Use  . Smoking status: Current Every Day Smoker    Packs/day: 0.70    Years: 60.00    Pack years: 42.00    Types: Cigarettes  . Smokeless tobacco: Never Used  Substance and Sexual Activity  . Alcohol use: No    Alcohol/week: 0.0 standard drinks  . Drug use: No  . Sexual activity: Not on file  Lifestyle  . Physical activity    Days per week: Not on file    Minutes per session: Not on file  . Stress: Not on file  Relationships  . Social Herbalist on phone: Not on file    Gets together: Not on file    Attends religious service: Not on file    Active member of club or organization: Not on file    Attends meetings of clubs or organizations: Not on file    Relationship status: Not on file  . Intimate partner violence    Fear of current or ex partner: Not on file    Emotionally abused: Not on file    Physically abused: Not on file    Forced sexual activity: Not on file  Other Topics Concern  . Not on file  Social History Narrative   Marital status: widowed 28 years ago.  From [place]; moved to [place] 1974.     Children: none       Lives:  Alone; brother in law is Psychologist, sport and exercise who is 34.  Employment: retired.       Tobacco:  1/2 ppd       Alcohol:  None      Exercise:  Walking daily.      ADLs: no driving.    FAMILY HISTORY: Family History  Problem Relation Age of Onset  . Hypercalcemia Neg Hx   . Osteoporosis Neg Hx     ALLERGIES:  has No Known Allergies.  MEDICATIONS:  Current Outpatient Medications  Medication Sig Dispense Refill  . acetaminophen (TYLENOL) 325 MG tablet Take 2 tablets (650 mg total) by mouth every 6 (six) hours as needed for mild pain, fever or headache.    Marland Kitchen amLODipine (NORVASC) 10 MG tablet Take 1 tablet (10 mg total) by mouth daily.    . cholecalciferol (VITAMIN D3) 25 MCG (1000 UT) tablet Take 1,000 Units by mouth daily.    . famotidine (PEPCID) 40 MG tablet Take 40 mg by mouth daily.    Marland Kitchen gabapentin (NEURONTIN) 100 MG capsule  Take 100 mg by mouth at bedtime.    . ibandronate (BONIVA) 150 MG tablet Take 1 tablet (150 mg total) by mouth every 30 (thirty) days. 3 tablet 3  . methimazole (TAPAZOLE) 5 MG tablet Take 0.5 tablets (2.5 mg total) by mouth every other day. 15 tablet 11  . Multiple Vitamin (MULTIVITAMIN WITH MINERALS) TABS tablet Take 1 tablet by mouth daily. 30 tablet 0  . omeprazole (PRILOSEC) 20 MG capsule TAKE 1 CAPSULE(20 MG) BY MOUTH TWICE DAILY 180 capsule 2  . polyethylene glycol (MIRALAX / GLYCOLAX) 17 g packet Take 17 g by mouth daily. 14 each 0  . ramelteon (ROZEREM) 8 MG tablet Take 1 tablet (8 mg total) by mouth at bedtime.    . senna (SENOKOT) 8.6 MG TABS tablet Take 1 tablet (8.6 mg total) by mouth daily as needed for mild constipation. 120 tablet 0  . traMADol (ULTRAM) 50 MG tablet Take by mouth every 4 (four) hours as needed.     No current facility-administered medications for this visit.     REVIEW OF SYSTEMS:   Constitutional: ( - ) fevers, ( - )  chills , ( - ) night sweats Eyes: ( - ) blurriness of vision, ( - ) double vision, ( - ) watery eyes Ears, nose, mouth, throat, and face: ( - ) mucositis, ( - ) sore throat Respiratory: ( - ) cough, ( - ) dyspnea, ( - ) wheezes Cardiovascular: ( - ) palpitation, ( - ) chest discomfort, ( - ) lower extremity swelling Gastrointestinal:  ( - ) nausea, ( - ) heartburn, ( - ) change in bowel habits Skin: ( - ) abnormal skin rashes Lymphatics: ( - ) new lymphadenopathy, ( - ) easy bruising Neurological: ( - ) numbness, ( - ) tingling, ( - ) new weaknesses Behavioral/Psych: ( - ) mood change, ( - ) new changes  All other systems were reviewed with the patient and are negative.  PHYSICAL EXAMINATION: ECOG PERFORMANCE STATUS: {CHL ONC ECOG [phone]}  There were no vitals filed for this visit. There were no vitals filed for this visit.  GENERAL: well appearing *** in NAD  SKIN: skin color, texture, turgor are normal, no rashes or significant  lesions EYES: conjunctiva are pink and non-injected, sclera clear OROPHARYNX: no exudate, no erythema; lips, buccal mucosa, and tongue normal  NECK: supple, non-tender LYMPH:  no palpable lymphadenopathy in the cervical, axillary or inguinal LUNGS: clear to auscultation and percussion with normal breathing effort HEART:  regular rate & rhythm and no murmurs and no lower extremity edema ABDOMEN: soft, non-tender, non-distended, normal bowel sounds Musculoskeletal: no cyanosis of digits and no clubbing  PSYCH: alert & oriented x 3, fluent speech NEURO: no focal motor/sensory deficits  LABORATORY DATA:  I have reviewed the data as listed Lab Results  Component Value Date   WBC 8.4 05/24/2019   HGB 9.7 (L) 05/24/2019   HCT 28.2 (L) 05/24/2019   MCV 93.1 05/24/2019   PLT 291 05/24/2019   NEUTROABS 8.5 (H) 05/17/2019    PATHOLOGY: None to review  BLOOD FILM: *** I personally reviewed the patient's peripheral blood smear today.  There was no peripheral blast.  The white blood cells and red blood cells were of normal morphology. There was no schistocytosis or anisocytosis.  The platelets are of normal size and I have verified that there were no platelet clumping.  RADIOGRAPHIC STUDIES: No relevant radiographic studies.  ASSESSMENT & PLAN REGINAE WOODHEAD 85 y.o. female with medical history significant for HTN, GERD, hypothyroidism and osteoporosis who presents today as a new patient evaluation for anemia.   #Anemia --  No orders of the defined types were placed in this encounter.   All questions were answered. The patient knows to call the clinic with any problems, questions or concerns.  A total of more than {CHL ONC TIME VISIT - [id number] were spent face-to-face with the patient during this encounter and over half of that time was spent on counseling and coordination of care as outlined above.   Ledell Peoples, MD Department of Hematology/Oncology [place] at [facility] Phone: [phone] Pager: [phone] Email: [email or web address]  09/02/2019 3:51 PM

## 2019-09-03 ENCOUNTER — Telehealth: Payer: Self-pay | Admitting: *Deleted

## 2019-09-03 ENCOUNTER — Inpatient Hospital Stay: Payer: Medicare Other

## 2019-09-03 ENCOUNTER — Inpatient Hospital Stay: Payer: Medicare Other | Admitting: Hematology and Oncology

## 2019-09-03 DIAGNOSIS — M6281 Muscle weakness (generalized): Secondary | ICD-10-CM | POA: Diagnosis not present

## 2019-09-03 DIAGNOSIS — R2689 Other abnormalities of gait and mobility: Secondary | ICD-10-CM | POA: Diagnosis not present

## 2019-09-03 DIAGNOSIS — S42251D Displaced fracture of greater tuberosity of right humerus, subsequent encounter for fracture with routine healing: Secondary | ICD-10-CM | POA: Diagnosis not present

## 2019-09-03 DIAGNOSIS — R279 Unspecified lack of coordination: Secondary | ICD-10-CM | POA: Diagnosis not present

## 2019-09-03 NOTE — Telephone Encounter (Signed)
[name] as patient had not arrived for appt. Was connected with transportation: [name] [phone]. She states she was running behind schedule with the transportation today, will not be able to get patient here this afternoon.   Rescheduled for Tuesday at 1100 with Dr Lorenso Courier and 1200 lab.  Message to scheduler to change times. Confirmed with [name] for transportation. Interpreter, called [facility] to confirm change. Barbara Dennis is here at the [facility] for today's appt. Called Language Resource line [phone] to confirm that Barbara Dennis came today and is OK to come on Tuesday for the appts.

## 2019-09-04 DIAGNOSIS — M6281 Muscle weakness (generalized): Secondary | ICD-10-CM | POA: Diagnosis not present

## 2019-09-04 DIAGNOSIS — R2689 Other abnormalities of gait and mobility: Secondary | ICD-10-CM | POA: Diagnosis not present

## 2019-09-04 DIAGNOSIS — S42251D Displaced fracture of greater tuberosity of right humerus, subsequent encounter for fracture with routine healing: Secondary | ICD-10-CM | POA: Diagnosis not present

## 2019-09-04 DIAGNOSIS — R279 Unspecified lack of coordination: Secondary | ICD-10-CM | POA: Diagnosis not present

## 2019-09-05 DIAGNOSIS — U071 COVID-19: Secondary | ICD-10-CM | POA: Diagnosis not present

## 2019-09-06 DIAGNOSIS — R279 Unspecified lack of coordination: Secondary | ICD-10-CM | POA: Diagnosis not present

## 2019-09-06 DIAGNOSIS — R2689 Other abnormalities of gait and mobility: Secondary | ICD-10-CM | POA: Diagnosis not present

## 2019-09-06 DIAGNOSIS — M6281 Muscle weakness (generalized): Secondary | ICD-10-CM | POA: Diagnosis not present

## 2019-09-06 DIAGNOSIS — S42251D Displaced fracture of greater tuberosity of right humerus, subsequent encounter for fracture with routine healing: Secondary | ICD-10-CM | POA: Diagnosis not present

## 2019-09-07 ENCOUNTER — Other Ambulatory Visit: Payer: Self-pay

## 2019-09-07 ENCOUNTER — Inpatient Hospital Stay: Payer: Medicare Other | Attending: Hematology and Oncology | Admitting: Hematology and Oncology

## 2019-09-07 ENCOUNTER — Inpatient Hospital Stay: Payer: Medicare Other

## 2019-09-07 VITALS — BP 108/57 | HR 90 | Temp 97.8°F | Resp 18 | Ht 60.0 in

## 2019-09-07 DIAGNOSIS — D649 Anemia, unspecified: Secondary | ICD-10-CM

## 2019-09-07 DIAGNOSIS — R279 Unspecified lack of coordination: Secondary | ICD-10-CM | POA: Diagnosis not present

## 2019-09-07 DIAGNOSIS — K219 Gastro-esophageal reflux disease without esophagitis: Secondary | ICD-10-CM | POA: Diagnosis not present

## 2019-09-07 DIAGNOSIS — F17210 Nicotine dependence, cigarettes, uncomplicated: Secondary | ICD-10-CM | POA: Insufficient documentation

## 2019-09-07 DIAGNOSIS — Z993 Dependence on wheelchair: Secondary | ICD-10-CM | POA: Insufficient documentation

## 2019-09-07 DIAGNOSIS — M810 Age-related osteoporosis without current pathological fracture: Secondary | ICD-10-CM | POA: Diagnosis not present

## 2019-09-07 DIAGNOSIS — I10 Essential (primary) hypertension: Secondary | ICD-10-CM | POA: Diagnosis not present

## 2019-09-07 DIAGNOSIS — S42251D Displaced fracture of greater tuberosity of right humerus, subsequent encounter for fracture with routine healing: Secondary | ICD-10-CM | POA: Diagnosis not present

## 2019-09-07 DIAGNOSIS — M6281 Muscle weakness (generalized): Secondary | ICD-10-CM | POA: Diagnosis not present

## 2019-09-07 DIAGNOSIS — R2689 Other abnormalities of gait and mobility: Secondary | ICD-10-CM | POA: Diagnosis not present

## 2019-09-07 LAB — CMP (CANCER CENTER ONLY)
ALT: 10 U/L (ref 0–44)
AST: 16 U/L (ref 15–41)
Albumin: 3.2 g/dL — ABNORMAL LOW (ref 3.5–5.0)
Alkaline Phosphatase: 95 U/L (ref 38–126)
Anion gap: 15 (ref 5–15)
BUN: 17 mg/dL (ref 8–23)
CO2: 23 mmol/L (ref 22–32)
Calcium: 8.8 mg/dL — ABNORMAL LOW (ref 8.9–10.3)
Chloride: 98 mmol/L (ref 98–111)
Creatinine: 0.9 mg/dL (ref 0.44–1.00)
GFR, Est AFR Am: >60 mL/min (ref >60)
GFR, Estimated: 59 mL/min — ABNORMAL LOW (ref >60)
Glucose, Bld: 104 mg/dL — ABNORMAL HIGH (ref 70–99)
Potassium: 3.6 mmol/L (ref 3.5–5.1)
Sodium: 136 mmol/L (ref 135–145)
Total Bilirubin: 0.8 mg/dL (ref 0.3–1.2)
Total Protein: 7.4 g/dL (ref 6.5–8.1)

## 2019-09-07 LAB — CBC WITH DIFFERENTIAL (CANCER CENTER ONLY)
Abs Immature Granulocytes: 0.03 10*3/uL (ref 0.00–0.07)
Basophils Absolute: 0 10*3/uL (ref 0.0–0.1)
Basophils Relative: 1 %
Eosinophils Absolute: 0.2 10*3/uL (ref 0.0–0.5)
Eosinophils Relative: 2 %
HCT: 29.5 % — ABNORMAL LOW (ref 36.0–46.0)
Hemoglobin: 10.3 g/dL — ABNORMAL LOW (ref 12.0–15.0)
Immature Granulocytes: 0 %
Lymphocytes Relative: 34 %
Lymphs Abs: 2.5 10*3/uL (ref 0.7–4.0)
MCH: 32.8 pg (ref 26.0–34.0)
MCHC: 34.9 g/dL (ref 30.0–36.0)
MCV: 93.9 fL (ref 80.0–100.0)
Monocytes Absolute: 0.5 10*3/uL (ref 0.1–1.0)
Monocytes Relative: 6 %
Neutro Abs: 4.3 10*3/uL (ref 1.7–7.7)
Neutrophils Relative %: 57 %
Platelet Count: 294 10*3/uL (ref 150–400)
RBC: 3.14 MIL/uL — ABNORMAL LOW (ref 3.87–5.11)
RDW: 15.9 % — ABNORMAL HIGH (ref 11.5–15.5)
WBC Count: 7.5 10*3/uL (ref 4.0–10.5)
nRBC: 0 % (ref 0.0–0.2)

## 2019-09-07 LAB — IRON AND TIBC
Iron: 63 ug/dL (ref 41–142)
Saturation Ratios: 38 % (ref 21–57)
TIBC: 165 ug/dL — ABNORMAL LOW (ref 236–444)
UIBC: 102 ug/dL — ABNORMAL LOW (ref 120–384)

## 2019-09-07 LAB — SAVE SMEAR(SSMR), FOR PROVIDER SLIDE REVIEW

## 2019-09-07 LAB — RETIC PANEL
Immature Retic Fract: 10.7 % (ref 2.3–15.9)
RBC.: 3.11 MIL/uL — ABNORMAL LOW (ref 3.87–5.11)
Retic Count, Absolute: 49.4 10*3/uL (ref 19.0–186.0)
Retic Ct Pct: 1.6 % (ref 0.4–3.1)
Reticulocyte Hemoglobin: 40.7 pg (ref >27.9)

## 2019-09-07 LAB — SEDIMENTATION RATE: Sed Rate: 35 mm/hr — ABNORMAL HIGH (ref 0–22)

## 2019-09-07 LAB — DIRECT ANTIGLOBULIN TEST (NOT AT ARMC)
DAT, IgG: NEGATIVE
DAT, complement: NEGATIVE

## 2019-09-07 LAB — LACTATE DEHYDROGENASE: LDH: 186 U/L (ref 98–192)

## 2019-09-07 LAB — FERRITIN: Ferritin: 252 ng/mL (ref 11–307)

## 2019-09-07 LAB — TSH: TSH: 0.6 u[IU]/mL (ref 0.308–3.960)

## 2019-09-07 NOTE — Progress Notes (Signed)
[facility] Telephone:([phone]) [phone]   Fax:([phone]) [place] NOTE  Patient Care Team: Horald Pollen, MD as PCP - General (Internal Medicine) Melida Quitter, MD as Consulting Physician (Otolaryngology)  Hematological/Oncological History  # Normocytic Anemia 1) 06/03/2019: Hgb 10.7, Plt 403, WBC 9.4, MCV 97.6 2) 08/02/2019: Hgb 9.4, Plt 239, WBC 8.5, MCV 94.4 3) 08/14/2019: PO iron 325mg  with vitamin C initiated 4) 08/16/2019: Hgb 8.6, Plt 232, WBC 5.6, MCV 94.4 5) Establish care with Dr. Lorenso Courier   CHIEF COMPLAINTS/PURPOSE OF CONSULTATION:  Normocytic anemia  HISTORY OF PRESENTING ILLNESS:  Barbara Dennis 85 y.o. female with medical history significant for HTN, GERD, and osteoporosis who presents for evaluation of a normocytic anemia. Her visit is assisted by an approved [place].     Outside records are limited but show a Hgb of 10.7 on 06/03/2019, with a decline to 9.4 on 10/12, and further decrease to 8.6 on 08/16/19. On 08/14/2019 she was started on PO iron 325mg  daily with vitamin C. Due to the drop in Hgb she was referred to hematology for further evaluation.  On exam today Barbara Dennis notes she feels well. Interview is limited by patients comprehension and she appears to have some baseline confusion. Some questions are answered with non sequiturs. She notes no overt signs of bleeding or bruising. She denies dark bowel movements. She is confined to wheelchair due to a hip fracture, but notes no shortness of breath or fatigue. When asked about meat consumption she is unclear, but states she eats what they feed her at the facility. She otherwise has no complaints and reports no new symptoms.   A 10 point ROS is listed below.   MEDICAL HISTORY:  Past Medical History:  Diagnosis Date  . Anemia of chronic disease   . GERD (gastroesophageal reflux disease)   . Hypertension   . Mass of right side of neck   . Osteoporosis   . Spinal  stenosis   . Tobacco abuse   . Urinary incontinence     SURGICAL HISTORY: Past Surgical History:  Procedure Laterality Date  . APPENDECTOMY    . BIOPSY  08/24/2018   Procedure: BIOPSY;  Surgeon: Thornton Park, MD;  Location: [facility];  Service: Gastroenterology;;  . ESOPHAGOGASTRODUODENOSCOPY (EGD) WITH PROPOFOL N/A 08/24/2018   Procedure: ESOPHAGOGASTRODUODENOSCOPY (EGD) WITH PROPOFOL;  Surgeon: Thornton Park, MD;  Location: [facility];  Service: Gastroenterology;  Laterality: N/A;    SOCIAL HISTORY: Social History   Socioeconomic History  . Marital status: Widowed    Spouse name: Not on file  . Number of children: Not on file  . Years of education: Not on file  . Highest education level: Not on file  Occupational History  . Not on file  Social Needs  . Financial resource strain: Not on file  . Food insecurity    Worry: Not on file    Inability: Not on file  . Transportation needs    Medical: Not on file    Non-medical: Not on file  Tobacco Use  . Smoking status: Current Every Day Smoker    Packs/day: 0.70    Years: 60.00    Pack years: 42.00    Types: Cigarettes  . Smokeless tobacco: Never Used  Substance and Sexual Activity  . Alcohol use: No    Alcohol/week: 0.0 standard drinks  . Drug use: No  . Sexual activity: Not on file  Lifestyle  . Physical activity    Days per week:  Not on file    Minutes per session: Not on file  . Stress: Not on file  Relationships  . Social Herbalist on phone: Not on file    Gets together: Not on file    Attends religious service: Not on file    Active member of club or organization: Not on file    Attends meetings of clubs or organizations: Not on file    Relationship status: Not on file  . Intimate partner violence    Fear of current or ex partner: Not on file    Emotionally abused: Not on file    Physically abused: Not on file    Forced sexual activity: Not on file  Other Topics Concern  . Not  on file  Social History Narrative   Marital status: widowed 28 years ago.  From [place]; moved to [place] 1974.     Children: none       Lives:  Alone; brother in law is Psychologist, sport and exercise who is 71.      Employment: retired.       Tobacco:  1/2 ppd       Alcohol:  None      Exercise:  Walking daily.      ADLs: no driving.    FAMILY HISTORY: Family History  Problem Relation Age of Onset  . Hypercalcemia Neg Hx   . Osteoporosis Neg Hx   Patient reports she does not know any of her family's history. States her parents died 60 years ago and she does not remember how.   ALLERGIES:  has No Known Allergies.  MEDICATIONS:  Current Outpatient Medications  Medication Sig Dispense Refill  . ascorbic acid (VITAMIN C) 500 MG tablet Take 500 mg by mouth daily.    . ferrous sulfate 325 (65 FE) MG tablet Take 325 mg by mouth daily with breakfast.    . pantoprazole (PROTONIX) 40 MG tablet Take 40 mg by mouth daily.    Marland Kitchen acetaminophen (TYLENOL) 325 MG tablet Take 2 tablets (650 mg total) by mouth every 6 (six) hours as needed for mild pain, fever or headache.    Marland Kitchen amLODipine (NORVASC) 10 MG tablet Take 1 tablet (10 mg total) by mouth daily.    . cholecalciferol (VITAMIN D3) 25 MCG (1000 UT) tablet Take 1,000 Units by mouth daily.    . famotidine (PEPCID) 40 MG tablet Take 40 mg by mouth daily.    Marland Kitchen gabapentin (NEURONTIN) 100 MG capsule Take 100 mg by mouth at bedtime.    . ibandronate (BONIVA) 150 MG tablet Take 1 tablet (150 mg total) by mouth every 30 (thirty) days. 3 tablet 3  . methimazole (TAPAZOLE) 5 MG tablet Take 0.5 tablets (2.5 mg total) by mouth every other day. 15 tablet 11  . Multiple Vitamin (MULTIVITAMIN WITH MINERALS) TABS tablet Take 1 tablet by mouth daily. 30 tablet 0  . polyethylene glycol (MIRALAX / GLYCOLAX) 17 g packet Take 17 g by mouth daily. 14 each 0  . ramelteon (ROZEREM) 8 MG tablet Take 1 tablet (8 mg total) by mouth at bedtime.    . senna (SENOKOT) 8.6 MG TABS tablet Take 1 tablet  (8.6 mg total) by mouth daily as needed for mild constipation. 120 tablet 0  . traMADol (ULTRAM) 50 MG tablet Take by mouth every 4 (four) hours as needed.     No current facility-administered medications for this visit.     REVIEW OF SYSTEMS:   Constitutional: ( - )  fevers, ( - )  chills , ( - ) night sweats Eyes: ( - ) blurriness of vision, ( - ) double vision, ( - ) watery eyes Ears, nose, mouth, throat, and face: ( - ) mucositis, ( - ) sore throat Respiratory: ( - ) cough, ( - ) dyspnea, ( - ) wheezes Cardiovascular: ( - ) palpitation, ( - ) chest discomfort, ( - ) lower extremity swelling Gastrointestinal:  ( - ) nausea, ( - ) heartburn, ( - ) change in bowel habits Skin: ( - ) abnormal skin rashes Lymphatics: ( - ) new lymphadenopathy, ( - ) easy bruising Neurological: ( - ) numbness, ( - ) tingling, ( - ) new weaknesses Behavioral/Psych: ( - ) mood change, ( - ) new changes  All other systems were reviewed with the patient and are negative.  PHYSICAL EXAMINATION:  Vitals:   09/07/19 1130  BP: (!) 108/57  Pulse: 90  Resp: 18  Temp: 97.8 F (36.6 C)  SpO2: 99%   Filed Weights    GENERAL: well appearing elderly Asian female in NAD  SKIN: skin color, texture, turgor are normal, no rashes or significant lesions EYES: conjunctiva are pink and non-injected, sclera clear LUNGS: clear to auscultation and percussion with normal breathing effort HEART: regular rate & rhythm and no murmurs and no lower extremity edema ABDOMEN: soft, non-tender, non-distended, normal bowel sounds Musculoskeletal: no cyanosis of digits and no clubbing  PSYCH: alert & oriented x 3, fluent speech NEURO: no focal motor/sensory deficits  LABORATORY DATA:  I have reviewed the data as listed Lab Results  Component Value Date   WBC 8.4 05/24/2019   HGB 9.7 (L) 05/24/2019   HCT 28.2 (L) 05/24/2019   MCV 93.1 05/24/2019   PLT 291 05/24/2019   NEUTROABS 8.5 (H) 05/17/2019    PATHOLOGY: None to  review.   BLOOD FILM:  I personally reviewed the patient's peripheral blood smear today.  There was no peripheral blast.  The white blood cells show rare hypersegmented neutrophils and occasional LGLs. Red blood cells show target cells and increased echinocytes. There was no schistocytosis or anisocytosis.  The platelets are of normal size with occasional platelet clumps.   RADIOGRAPHIC STUDIES: None to review.   ASSESSMENT & PLAN CHEYNE PEALE 85 y.o. female with medical history significant for HTN, GERD, and osteoporosis who presents for evaluation of a normocytic anemia. The etiology of her anemia is not readily apparent and could be multifactorial. The differential includes blood loss, nutritional deficiency, or underproduction by the marrow. We will assess her nutritional status (her history of diet was not contributory). Given this is normocytic it is less likely this represents an iron deficiency anemia. Blood loss would be a possibility, but likely from a GI source.  Additionally we will evaluate for a primary marrow disorder by reviewing the peripheral blood film and checking the SPEP/SFLC.   Given her advanced age, if no clear source is identified I am unsure if she would be a candidate for GI intervention. Additionally if she were found to have a concerning SPEP I don't know that we would pursue aggressive intervention. These would have to be discussed in detail with the patient and any other family that was available. At this time we will focus on identifying the cause of the anemia, but I do not necessarily recommend invasive procedures to confirm a diagnosis that we could not intervene upon.   #Normocytic Anemia --will check iron panel and ferritin, additional nutritional  evaluation with Vitamin B12, folate, MMA, and homocysteine. Additionally will collect reticulocyte panel and TSH --given this is a normocytic anemia, recommend DAT, SPEP, SFLC, and LDH --review of peripheral blood  film shows increased segments in the neutrophils, LGLs, and some target cells/echinocytes.  --possible cause of her current symptoms is GI bleeding. Will consider referral to GI pending the results of the above labs.  --RTC in 3 months to reassess. Can RTC sooner if concerning abnormalities are noted.   Orders Placed This Encounter  Procedures  . CBC with Differential (Cancer Center Only)    Standing Status:   Future    Number of Occurrences:   1    Standing Expiration Date:   09/06/2020  . Retic Panel    Standing Status:   Future    Number of Occurrences:   1    Standing Expiration Date:   09/06/2020  . Save Smear ([facility])    Standing Status:   Future    Number of Occurrences:   1    Standing Expiration Date:   09/06/2020  . CMP ([facility] only)    Standing Status:   Future    Number of Occurrences:   1    Standing Expiration Date:   09/06/2020  . Lactate dehydrogenase (LDH)    Standing Status:   Future    Number of Occurrences:   1    Standing Expiration Date:   09/06/2020  . TSH    Standing Status:   Future    Number of Occurrences:   1    Standing Expiration Date:   09/06/2020  . Iron and TIBC    Standing Status:   Future    Number of Occurrences:   1    Standing Expiration Date:   09/06/2020  . Ferritin    Standing Status:   Future    Number of Occurrences:   1    Standing Expiration Date:   09/06/2020  . Sedimentation rate    Standing Status:   Future    Number of Occurrences:   1    Standing Expiration Date:   09/06/2020  . SPEP with reflex to IFE    Standing Status:   Future    Number of Occurrences:   1    Standing Expiration Date:   09/06/2020  . Kappa/lambda light chains    Standing Status:   Future    Number of Occurrences:   1    Standing Expiration Date:   09/06/2020  . Methylmalonic acid, serum    Standing Status:   Future    Number of Occurrences:   1    Standing Expiration Date:   09/06/2020  . Homocysteine, serum    Standing Status:   Future     Number of Occurrences:   1    Standing Expiration Date:   09/06/2020  . Haptoglobin    Standing Status:   Future    Number of Occurrences:   1    Standing Expiration Date:   09/06/2020  . Direct antiglobulin test ([facility])    Standing Status:   Future    Number of Occurrences:   1    Standing Expiration Date:   09/06/2020    All questions were answered. The patient knows to call the clinic with any problems, questions or concerns.  A total of more than 60 minutes were spent face-to-face with the patient during this encounter and over half of that time was spent on counseling  and coordination of care as outlined above.   Ledell Peoples, MD Department of Hematology/Oncology [place] [facility] Phone: [phone] Pager: [phone] Email: [email or web address]  09/07/2019 12:40 PM

## 2019-09-08 DIAGNOSIS — S42251D Displaced fracture of greater tuberosity of right humerus, subsequent encounter for fracture with routine healing: Secondary | ICD-10-CM | POA: Diagnosis not present

## 2019-09-08 DIAGNOSIS — R2689 Other abnormalities of gait and mobility: Secondary | ICD-10-CM | POA: Diagnosis not present

## 2019-09-08 DIAGNOSIS — M6281 Muscle weakness (generalized): Secondary | ICD-10-CM | POA: Diagnosis not present

## 2019-09-08 DIAGNOSIS — R279 Unspecified lack of coordination: Secondary | ICD-10-CM | POA: Diagnosis not present

## 2019-09-08 LAB — PROTEIN ELECTROPHORESIS, SERUM, WITH REFLEX
A/G Ratio: 0.9 (ref 0.7–1.7)
Albumin ELP: 3.3 g/dL (ref 2.9–4.4)
Alpha-1-Globulin: 0.3 g/dL (ref 0.0–0.4)
Alpha-2-Globulin: 0.7 g/dL (ref 0.4–1.0)
Beta Globulin: 0.9 g/dL (ref 0.7–1.3)
Gamma Globulin: 1.9 g/dL — ABNORMAL HIGH (ref 0.4–1.8)
Globulin, Total: 3.8 g/dL (ref 2.2–3.9)
Total Protein ELP: 7.1 g/dL (ref 6.0–8.5)

## 2019-09-08 LAB — KAPPA/LAMBDA LIGHT CHAINS
Kappa free light chain: 54.4 mg/L — ABNORMAL HIGH (ref 3.3–19.4)
Kappa, lambda light chain ratio: 0.76 (ref 0.26–1.65)
Lambda free light chains: 71.3 mg/L — ABNORMAL HIGH (ref 5.7–26.3)

## 2019-09-08 LAB — HAPTOGLOBIN: Haptoglobin: 98 mg/dL (ref 41–333)

## 2019-09-08 LAB — HOMOCYSTEINE: Homocysteine: 27.5 umol/L — ABNORMAL HIGH (ref 0.0–21.3)

## 2019-09-09 ENCOUNTER — Ambulatory Visit: Payer: Medicare Other | Admitting: Endocrinology

## 2019-09-09 DIAGNOSIS — R2689 Other abnormalities of gait and mobility: Secondary | ICD-10-CM | POA: Diagnosis not present

## 2019-09-09 DIAGNOSIS — S42251D Displaced fracture of greater tuberosity of right humerus, subsequent encounter for fracture with routine healing: Secondary | ICD-10-CM | POA: Diagnosis not present

## 2019-09-09 DIAGNOSIS — R279 Unspecified lack of coordination: Secondary | ICD-10-CM | POA: Diagnosis not present

## 2019-09-09 DIAGNOSIS — M6281 Muscle weakness (generalized): Secondary | ICD-10-CM | POA: Diagnosis not present

## 2019-09-10 DIAGNOSIS — R2689 Other abnormalities of gait and mobility: Secondary | ICD-10-CM | POA: Diagnosis not present

## 2019-09-10 DIAGNOSIS — M6281 Muscle weakness (generalized): Secondary | ICD-10-CM | POA: Diagnosis not present

## 2019-09-10 DIAGNOSIS — S42251D Displaced fracture of greater tuberosity of right humerus, subsequent encounter for fracture with routine healing: Secondary | ICD-10-CM | POA: Diagnosis not present

## 2019-09-10 DIAGNOSIS — R279 Unspecified lack of coordination: Secondary | ICD-10-CM | POA: Diagnosis not present

## 2019-09-11 DIAGNOSIS — R279 Unspecified lack of coordination: Secondary | ICD-10-CM | POA: Diagnosis not present

## 2019-09-11 DIAGNOSIS — S42251D Displaced fracture of greater tuberosity of right humerus, subsequent encounter for fracture with routine healing: Secondary | ICD-10-CM | POA: Diagnosis not present

## 2019-09-11 DIAGNOSIS — M6281 Muscle weakness (generalized): Secondary | ICD-10-CM | POA: Diagnosis not present

## 2019-09-11 DIAGNOSIS — R2689 Other abnormalities of gait and mobility: Secondary | ICD-10-CM | POA: Diagnosis not present

## 2019-09-11 LAB — METHYLMALONIC ACID, SERUM: Methylmalonic Acid, Quantitative: 125 nmol/L (ref 0–378)

## 2019-09-12 DIAGNOSIS — U071 COVID-19: Secondary | ICD-10-CM | POA: Diagnosis not present

## 2019-09-12 DIAGNOSIS — S42251D Displaced fracture of greater tuberosity of right humerus, subsequent encounter for fracture with routine healing: Secondary | ICD-10-CM | POA: Diagnosis not present

## 2019-09-12 DIAGNOSIS — R2689 Other abnormalities of gait and mobility: Secondary | ICD-10-CM | POA: Diagnosis not present

## 2019-09-12 DIAGNOSIS — R279 Unspecified lack of coordination: Secondary | ICD-10-CM | POA: Diagnosis not present

## 2019-09-12 DIAGNOSIS — M6281 Muscle weakness (generalized): Secondary | ICD-10-CM | POA: Diagnosis not present

## 2019-09-13 ENCOUNTER — Other Ambulatory Visit: Payer: Self-pay

## 2019-09-13 ENCOUNTER — Telehealth: Payer: Self-pay | Admitting: *Deleted

## 2019-09-13 ENCOUNTER — Ambulatory Visit (INDEPENDENT_AMBULATORY_CARE_PROVIDER_SITE_OTHER)
Admission: RE | Admit: 2019-09-13 | Discharge: 2019-09-13 | Disposition: A | Discharge status: Home / Self Care | Payer: Medicare Other | Source: Ambulatory Visit | Attending: Endocrinology | Admitting: Endocrinology

## 2019-09-13 DIAGNOSIS — M810 Age-related osteoporosis without current pathological fracture: Secondary | ICD-10-CM

## 2019-09-13 DIAGNOSIS — M6281 Muscle weakness (generalized): Secondary | ICD-10-CM | POA: Diagnosis not present

## 2019-09-13 DIAGNOSIS — R279 Unspecified lack of coordination: Secondary | ICD-10-CM | POA: Diagnosis not present

## 2019-09-13 DIAGNOSIS — R2689 Other abnormalities of gait and mobility: Secondary | ICD-10-CM | POA: Diagnosis not present

## 2019-09-13 DIAGNOSIS — S42251D Displaced fracture of greater tuberosity of right humerus, subsequent encounter for fracture with routine healing: Secondary | ICD-10-CM | POA: Diagnosis not present

## 2019-09-13 NOTE — Telephone Encounter (Signed)
[facility] [address]  To speak with pt's nurse regarding lab results from pt's visit here on 09/07/19.  Receptionist was not able to reach pt's nurse. The supervisor's cell # was given to me to contact.  This is Aniceto Boss @ [phone]  Call made to [facility]. No answer but was able to leave vm message for her to return my call @ [phone].    [name] labs reveal Folate deficiency and pt needs to be started on Folate 1 mg daily.  Awaiting call back

## 2019-09-14 DIAGNOSIS — R2689 Other abnormalities of gait and mobility: Secondary | ICD-10-CM | POA: Diagnosis not present

## 2019-09-14 DIAGNOSIS — R279 Unspecified lack of coordination: Secondary | ICD-10-CM | POA: Diagnosis not present

## 2019-09-14 DIAGNOSIS — S42251D Displaced fracture of greater tuberosity of right humerus, subsequent encounter for fracture with routine healing: Secondary | ICD-10-CM | POA: Diagnosis not present

## 2019-09-14 DIAGNOSIS — M6281 Muscle weakness (generalized): Secondary | ICD-10-CM | POA: Diagnosis not present

## 2019-09-15 ENCOUNTER — Telehealth: Payer: Self-pay

## 2019-09-15 DIAGNOSIS — S42251D Displaced fracture of greater tuberosity of right humerus, subsequent encounter for fracture with routine healing: Secondary | ICD-10-CM | POA: Diagnosis not present

## 2019-09-15 DIAGNOSIS — M6281 Muscle weakness (generalized): Secondary | ICD-10-CM | POA: Diagnosis not present

## 2019-09-15 DIAGNOSIS — R279 Unspecified lack of coordination: Secondary | ICD-10-CM | POA: Diagnosis not present

## 2019-09-15 DIAGNOSIS — R2689 Other abnormalities of gait and mobility: Secondary | ICD-10-CM | POA: Diagnosis not present

## 2019-09-15 NOTE — Telephone Encounter (Signed)
-----   Message from Renato Shin, MD sent at 09/14/2019  7:54 PM EST ----- please contact patient's dtr: Osteoporosis is worse.  You should consider adding "Prolia" (twice a year shot given here at the office). OK with you?

## 2019-09-16 DIAGNOSIS — S42251D Displaced fracture of greater tuberosity of right humerus, subsequent encounter for fracture with routine healing: Secondary | ICD-10-CM | POA: Diagnosis not present

## 2019-09-16 DIAGNOSIS — R2689 Other abnormalities of gait and mobility: Secondary | ICD-10-CM | POA: Diagnosis not present

## 2019-09-16 DIAGNOSIS — R279 Unspecified lack of coordination: Secondary | ICD-10-CM | POA: Diagnosis not present

## 2019-09-16 DIAGNOSIS — M6281 Muscle weakness (generalized): Secondary | ICD-10-CM | POA: Diagnosis not present

## 2019-09-17 DIAGNOSIS — R279 Unspecified lack of coordination: Secondary | ICD-10-CM | POA: Diagnosis not present

## 2019-09-17 DIAGNOSIS — S42251D Displaced fracture of greater tuberosity of right humerus, subsequent encounter for fracture with routine healing: Secondary | ICD-10-CM | POA: Diagnosis not present

## 2019-09-17 DIAGNOSIS — M6281 Muscle weakness (generalized): Secondary | ICD-10-CM | POA: Diagnosis not present

## 2019-09-17 DIAGNOSIS — R2689 Other abnormalities of gait and mobility: Secondary | ICD-10-CM | POA: Diagnosis not present

## 2019-09-19 DIAGNOSIS — M6281 Muscle weakness (generalized): Secondary | ICD-10-CM | POA: Diagnosis not present

## 2019-09-19 DIAGNOSIS — R2689 Other abnormalities of gait and mobility: Secondary | ICD-10-CM | POA: Diagnosis not present

## 2019-09-19 DIAGNOSIS — M810 Age-related osteoporosis without current pathological fracture: Secondary | ICD-10-CM | POA: Diagnosis not present

## 2019-09-19 DIAGNOSIS — K2970 Gastritis, unspecified, without bleeding: Secondary | ICD-10-CM | POA: Diagnosis not present

## 2019-09-19 DIAGNOSIS — N3281 Overactive bladder: Secondary | ICD-10-CM | POA: Diagnosis not present

## 2019-09-19 DIAGNOSIS — R279 Unspecified lack of coordination: Secondary | ICD-10-CM | POA: Diagnosis not present

## 2019-09-19 DIAGNOSIS — S42251D Displaced fracture of greater tuberosity of right humerus, subsequent encounter for fracture with routine healing: Secondary | ICD-10-CM | POA: Diagnosis not present

## 2019-09-19 DIAGNOSIS — U071 COVID-19: Secondary | ICD-10-CM | POA: Diagnosis not present

## 2019-09-19 DIAGNOSIS — S22080A Wedge compression fracture of T11-T12 vertebra, initial encounter for closed fracture: Secondary | ICD-10-CM | POA: Diagnosis not present

## 2019-09-20 DIAGNOSIS — M6281 Muscle weakness (generalized): Secondary | ICD-10-CM | POA: Diagnosis not present

## 2019-09-20 DIAGNOSIS — R279 Unspecified lack of coordination: Secondary | ICD-10-CM | POA: Diagnosis not present

## 2019-09-20 DIAGNOSIS — R2689 Other abnormalities of gait and mobility: Secondary | ICD-10-CM | POA: Diagnosis not present

## 2019-09-20 DIAGNOSIS — S42251D Displaced fracture of greater tuberosity of right humerus, subsequent encounter for fracture with routine healing: Secondary | ICD-10-CM | POA: Diagnosis not present

## 2019-09-20 NOTE — Telephone Encounter (Signed)
atc pt no answer and no vm via interpretter

## 2019-09-21 DIAGNOSIS — U071 COVID-19: Secondary | ICD-10-CM | POA: Diagnosis not present

## 2019-09-21 DIAGNOSIS — M6281 Muscle weakness (generalized): Secondary | ICD-10-CM | POA: Diagnosis not present

## 2019-09-21 DIAGNOSIS — R1311 Dysphagia, oral phase: Secondary | ICD-10-CM | POA: Diagnosis not present

## 2019-09-21 DIAGNOSIS — R279 Unspecified lack of coordination: Secondary | ICD-10-CM | POA: Diagnosis not present

## 2019-09-21 DIAGNOSIS — R1319 Other dysphagia: Secondary | ICD-10-CM | POA: Diagnosis not present

## 2019-09-22 ENCOUNTER — Ambulatory Visit: Payer: Medicare Other | Admitting: Endocrinology

## 2019-09-22 DIAGNOSIS — R1319 Other dysphagia: Secondary | ICD-10-CM | POA: Diagnosis not present

## 2019-09-22 DIAGNOSIS — U071 COVID-19: Secondary | ICD-10-CM | POA: Diagnosis not present

## 2019-09-22 DIAGNOSIS — R1311 Dysphagia, oral phase: Secondary | ICD-10-CM | POA: Diagnosis not present

## 2019-09-22 DIAGNOSIS — R279 Unspecified lack of coordination: Secondary | ICD-10-CM | POA: Diagnosis not present

## 2019-09-22 DIAGNOSIS — M6281 Muscle weakness (generalized): Secondary | ICD-10-CM | POA: Diagnosis not present

## 2019-09-22 NOTE — Telephone Encounter (Signed)
Pt dtr returned call. Informed about lab results and inquired about Prolia. States she will need to discuss with family and pt. Will call back with decision about whether or not they want to begin Prolia.

## 2019-09-22 NOTE — Telephone Encounter (Signed)
Daughter called back [phone] number where she can be reached

## 2019-09-22 NOTE — Telephone Encounter (Signed)
Called dtr and attempted to inform about information below. However, she is unable to comprehend the information I provided. She requested to have her daughter call me to further discuss. Will await call.

## 2019-09-22 NOTE — Telephone Encounter (Signed)
Returned daughter's call. LVM requesting returned call.

## 2019-09-23 DIAGNOSIS — R1319 Other dysphagia: Secondary | ICD-10-CM | POA: Diagnosis not present

## 2019-09-23 DIAGNOSIS — U071 COVID-19: Secondary | ICD-10-CM | POA: Diagnosis not present

## 2019-09-23 DIAGNOSIS — M6281 Muscle weakness (generalized): Secondary | ICD-10-CM | POA: Diagnosis not present

## 2019-09-23 DIAGNOSIS — R279 Unspecified lack of coordination: Secondary | ICD-10-CM | POA: Diagnosis not present

## 2019-09-23 DIAGNOSIS — R1311 Dysphagia, oral phase: Secondary | ICD-10-CM | POA: Diagnosis not present

## 2019-09-28 DIAGNOSIS — U071 COVID-19: Secondary | ICD-10-CM | POA: Diagnosis not present

## 2019-09-28 DIAGNOSIS — R1311 Dysphagia, oral phase: Secondary | ICD-10-CM | POA: Diagnosis not present

## 2019-09-28 DIAGNOSIS — R279 Unspecified lack of coordination: Secondary | ICD-10-CM | POA: Diagnosis not present

## 2019-09-28 DIAGNOSIS — R1319 Other dysphagia: Secondary | ICD-10-CM | POA: Diagnosis not present

## 2019-09-28 DIAGNOSIS — M6281 Muscle weakness (generalized): Secondary | ICD-10-CM | POA: Diagnosis not present

## 2019-09-28 NOTE — Telephone Encounter (Signed)
OK 

## 2019-09-28 NOTE — Telephone Encounter (Signed)
Patients daughter called back in wanting to let you know that the patients insurance wont be effective again until March of next year. She does want to get the shot but will have to hold off until her coverage is active again     Please call and advise or for more clarity

## 2019-09-28 NOTE — Telephone Encounter (Signed)
Please refer to pt and family preference re: Prolia

## 2019-09-29 ENCOUNTER — Ambulatory Visit: Payer: Medicare Other | Admitting: Gastroenterology

## 2019-09-29 DIAGNOSIS — R279 Unspecified lack of coordination: Secondary | ICD-10-CM | POA: Diagnosis not present

## 2019-09-29 DIAGNOSIS — R1311 Dysphagia, oral phase: Secondary | ICD-10-CM | POA: Diagnosis not present

## 2019-09-29 DIAGNOSIS — M6281 Muscle weakness (generalized): Secondary | ICD-10-CM | POA: Diagnosis not present

## 2019-09-29 DIAGNOSIS — R1319 Other dysphagia: Secondary | ICD-10-CM | POA: Diagnosis not present

## 2019-09-29 DIAGNOSIS — U071 COVID-19: Secondary | ICD-10-CM | POA: Diagnosis not present

## 2019-09-30 DIAGNOSIS — R1311 Dysphagia, oral phase: Secondary | ICD-10-CM | POA: Diagnosis not present

## 2019-09-30 DIAGNOSIS — M6281 Muscle weakness (generalized): Secondary | ICD-10-CM | POA: Diagnosis not present

## 2019-09-30 DIAGNOSIS — R279 Unspecified lack of coordination: Secondary | ICD-10-CM | POA: Diagnosis not present

## 2019-09-30 DIAGNOSIS — R1319 Other dysphagia: Secondary | ICD-10-CM | POA: Diagnosis not present

## 2019-09-30 DIAGNOSIS — U071 COVID-19: Secondary | ICD-10-CM | POA: Diagnosis not present

## 2019-10-07 DIAGNOSIS — R1311 Dysphagia, oral phase: Secondary | ICD-10-CM | POA: Diagnosis not present

## 2019-10-07 DIAGNOSIS — U071 COVID-19: Secondary | ICD-10-CM | POA: Diagnosis not present

## 2019-10-07 DIAGNOSIS — R1319 Other dysphagia: Secondary | ICD-10-CM | POA: Diagnosis not present

## 2019-10-07 DIAGNOSIS — M6281 Muscle weakness (generalized): Secondary | ICD-10-CM | POA: Diagnosis not present

## 2019-10-07 DIAGNOSIS — R279 Unspecified lack of coordination: Secondary | ICD-10-CM | POA: Diagnosis not present

## 2019-10-10 DIAGNOSIS — U071 COVID-19: Secondary | ICD-10-CM | POA: Diagnosis not present

## 2019-10-10 DIAGNOSIS — R1319 Other dysphagia: Secondary | ICD-10-CM | POA: Diagnosis not present

## 2019-10-10 DIAGNOSIS — M6281 Muscle weakness (generalized): Secondary | ICD-10-CM | POA: Diagnosis not present

## 2019-10-10 DIAGNOSIS — R1311 Dysphagia, oral phase: Secondary | ICD-10-CM | POA: Diagnosis not present

## 2019-10-10 DIAGNOSIS — R279 Unspecified lack of coordination: Secondary | ICD-10-CM | POA: Diagnosis not present

## 2019-10-11 DIAGNOSIS — R279 Unspecified lack of coordination: Secondary | ICD-10-CM | POA: Diagnosis not present

## 2019-10-11 DIAGNOSIS — U071 COVID-19: Secondary | ICD-10-CM | POA: Diagnosis not present

## 2019-10-11 DIAGNOSIS — R1319 Other dysphagia: Secondary | ICD-10-CM | POA: Diagnosis not present

## 2019-10-11 DIAGNOSIS — R1311 Dysphagia, oral phase: Secondary | ICD-10-CM | POA: Diagnosis not present

## 2019-10-11 DIAGNOSIS — M6281 Muscle weakness (generalized): Secondary | ICD-10-CM | POA: Diagnosis not present

## 2019-10-13 DIAGNOSIS — M6281 Muscle weakness (generalized): Secondary | ICD-10-CM | POA: Diagnosis not present

## 2019-10-13 DIAGNOSIS — R279 Unspecified lack of coordination: Secondary | ICD-10-CM | POA: Diagnosis not present

## 2019-10-13 DIAGNOSIS — R1311 Dysphagia, oral phase: Secondary | ICD-10-CM | POA: Diagnosis not present

## 2019-10-13 DIAGNOSIS — U071 COVID-19: Secondary | ICD-10-CM | POA: Diagnosis not present

## 2019-10-13 DIAGNOSIS — R1319 Other dysphagia: Secondary | ICD-10-CM | POA: Diagnosis not present

## 2019-10-14 DIAGNOSIS — M6281 Muscle weakness (generalized): Secondary | ICD-10-CM | POA: Diagnosis not present

## 2019-10-14 DIAGNOSIS — U071 COVID-19: Secondary | ICD-10-CM | POA: Diagnosis not present

## 2019-10-14 DIAGNOSIS — R1319 Other dysphagia: Secondary | ICD-10-CM | POA: Diagnosis not present

## 2019-10-14 DIAGNOSIS — R1311 Dysphagia, oral phase: Secondary | ICD-10-CM | POA: Diagnosis not present

## 2019-10-14 DIAGNOSIS — R279 Unspecified lack of coordination: Secondary | ICD-10-CM | POA: Diagnosis not present

## 2019-10-18 DIAGNOSIS — R1311 Dysphagia, oral phase: Secondary | ICD-10-CM | POA: Diagnosis not present

## 2019-10-18 DIAGNOSIS — S42251D Displaced fracture of greater tuberosity of right humerus, subsequent encounter for fracture with routine healing: Secondary | ICD-10-CM | POA: Diagnosis not present

## 2019-10-18 DIAGNOSIS — D649 Anemia, unspecified: Secondary | ICD-10-CM | POA: Diagnosis not present

## 2019-10-18 DIAGNOSIS — R1319 Other dysphagia: Secondary | ICD-10-CM | POA: Diagnosis not present

## 2019-10-18 DIAGNOSIS — D119 Benign neoplasm of major salivary gland, unspecified: Secondary | ICD-10-CM | POA: Diagnosis not present

## 2019-10-18 DIAGNOSIS — S31000A Unspecified open wound of lower back and pelvis without penetration into retroperitoneum, initial encounter: Secondary | ICD-10-CM | POA: Diagnosis not present

## 2019-10-18 DIAGNOSIS — R279 Unspecified lack of coordination: Secondary | ICD-10-CM | POA: Diagnosis not present

## 2019-10-18 DIAGNOSIS — U071 COVID-19: Secondary | ICD-10-CM | POA: Diagnosis not present

## 2019-10-18 DIAGNOSIS — M6281 Muscle weakness (generalized): Secondary | ICD-10-CM | POA: Diagnosis not present

## 2019-10-19 DIAGNOSIS — U071 COVID-19: Secondary | ICD-10-CM | POA: Diagnosis not present

## 2019-10-19 DIAGNOSIS — M6281 Muscle weakness (generalized): Secondary | ICD-10-CM | POA: Diagnosis not present

## 2019-10-19 DIAGNOSIS — R1311 Dysphagia, oral phase: Secondary | ICD-10-CM | POA: Diagnosis not present

## 2019-10-19 DIAGNOSIS — R279 Unspecified lack of coordination: Secondary | ICD-10-CM | POA: Diagnosis not present

## 2019-10-19 DIAGNOSIS — R1319 Other dysphagia: Secondary | ICD-10-CM | POA: Diagnosis not present

## 2019-10-20 ENCOUNTER — Telehealth: Payer: Self-pay | Admitting: Hematology and Oncology

## 2019-10-20 DIAGNOSIS — M6281 Muscle weakness (generalized): Secondary | ICD-10-CM | POA: Diagnosis not present

## 2019-10-20 DIAGNOSIS — R1319 Other dysphagia: Secondary | ICD-10-CM | POA: Diagnosis not present

## 2019-10-20 DIAGNOSIS — U071 COVID-19: Secondary | ICD-10-CM | POA: Diagnosis not present

## 2019-10-20 DIAGNOSIS — R279 Unspecified lack of coordination: Secondary | ICD-10-CM | POA: Diagnosis not present

## 2019-10-20 DIAGNOSIS — R1311 Dysphagia, oral phase: Secondary | ICD-10-CM | POA: Diagnosis not present

## 2019-10-20 NOTE — Telephone Encounter (Signed)
Scheduled per 11/17 los. Called left msg. Mailing printout

## 2019-10-21 DIAGNOSIS — R1311 Dysphagia, oral phase: Secondary | ICD-10-CM | POA: Diagnosis not present

## 2019-10-21 DIAGNOSIS — M6281 Muscle weakness (generalized): Secondary | ICD-10-CM | POA: Diagnosis not present

## 2019-10-21 DIAGNOSIS — U071 COVID-19: Secondary | ICD-10-CM | POA: Diagnosis not present

## 2019-10-21 DIAGNOSIS — R279 Unspecified lack of coordination: Secondary | ICD-10-CM | POA: Diagnosis not present

## 2019-10-21 DIAGNOSIS — R1319 Other dysphagia: Secondary | ICD-10-CM | POA: Diagnosis not present

## 2019-10-22 DIAGNOSIS — R1311 Dysphagia, oral phase: Secondary | ICD-10-CM | POA: Diagnosis not present

## 2019-10-22 DIAGNOSIS — S42251D Displaced fracture of greater tuberosity of right humerus, subsequent encounter for fracture with routine healing: Secondary | ICD-10-CM | POA: Diagnosis not present

## 2019-10-22 DIAGNOSIS — R279 Unspecified lack of coordination: Secondary | ICD-10-CM | POA: Diagnosis not present

## 2019-10-22 DIAGNOSIS — R1319 Other dysphagia: Secondary | ICD-10-CM | POA: Diagnosis not present

## 2019-10-22 DIAGNOSIS — M6281 Muscle weakness (generalized): Secondary | ICD-10-CM | POA: Diagnosis not present

## 2019-10-25 DIAGNOSIS — R279 Unspecified lack of coordination: Secondary | ICD-10-CM | POA: Diagnosis not present

## 2019-10-25 DIAGNOSIS — R1311 Dysphagia, oral phase: Secondary | ICD-10-CM | POA: Diagnosis not present

## 2019-10-25 DIAGNOSIS — M6281 Muscle weakness (generalized): Secondary | ICD-10-CM | POA: Diagnosis not present

## 2019-10-25 DIAGNOSIS — R1319 Other dysphagia: Secondary | ICD-10-CM | POA: Diagnosis not present

## 2019-10-25 DIAGNOSIS — S42251D Displaced fracture of greater tuberosity of right humerus, subsequent encounter for fracture with routine healing: Secondary | ICD-10-CM | POA: Diagnosis not present

## 2019-10-26 DIAGNOSIS — R279 Unspecified lack of coordination: Secondary | ICD-10-CM | POA: Diagnosis not present

## 2019-10-26 DIAGNOSIS — S42251D Displaced fracture of greater tuberosity of right humerus, subsequent encounter for fracture with routine healing: Secondary | ICD-10-CM | POA: Diagnosis not present

## 2019-10-26 DIAGNOSIS — R1319 Other dysphagia: Secondary | ICD-10-CM | POA: Diagnosis not present

## 2019-10-26 DIAGNOSIS — M6281 Muscle weakness (generalized): Secondary | ICD-10-CM | POA: Diagnosis not present

## 2019-10-26 DIAGNOSIS — R1311 Dysphagia, oral phase: Secondary | ICD-10-CM | POA: Diagnosis not present

## 2019-10-27 DIAGNOSIS — M6281 Muscle weakness (generalized): Secondary | ICD-10-CM | POA: Diagnosis not present

## 2019-10-27 DIAGNOSIS — R279 Unspecified lack of coordination: Secondary | ICD-10-CM | POA: Diagnosis not present

## 2019-10-27 DIAGNOSIS — R1311 Dysphagia, oral phase: Secondary | ICD-10-CM | POA: Diagnosis not present

## 2019-10-27 DIAGNOSIS — S42251D Displaced fracture of greater tuberosity of right humerus, subsequent encounter for fracture with routine healing: Secondary | ICD-10-CM | POA: Diagnosis not present

## 2019-10-27 DIAGNOSIS — R1319 Other dysphagia: Secondary | ICD-10-CM | POA: Diagnosis not present

## 2019-10-28 DIAGNOSIS — S42251D Displaced fracture of greater tuberosity of right humerus, subsequent encounter for fracture with routine healing: Secondary | ICD-10-CM | POA: Diagnosis not present

## 2019-10-28 DIAGNOSIS — R1311 Dysphagia, oral phase: Secondary | ICD-10-CM | POA: Diagnosis not present

## 2019-10-28 DIAGNOSIS — R1319 Other dysphagia: Secondary | ICD-10-CM | POA: Diagnosis not present

## 2019-10-28 DIAGNOSIS — R279 Unspecified lack of coordination: Secondary | ICD-10-CM | POA: Diagnosis not present

## 2019-10-28 DIAGNOSIS — M6281 Muscle weakness (generalized): Secondary | ICD-10-CM | POA: Diagnosis not present

## 2019-10-29 DIAGNOSIS — M6281 Muscle weakness (generalized): Secondary | ICD-10-CM | POA: Diagnosis not present

## 2019-10-29 DIAGNOSIS — R279 Unspecified lack of coordination: Secondary | ICD-10-CM | POA: Diagnosis not present

## 2019-10-29 DIAGNOSIS — R1311 Dysphagia, oral phase: Secondary | ICD-10-CM | POA: Diagnosis not present

## 2019-10-29 DIAGNOSIS — R1319 Other dysphagia: Secondary | ICD-10-CM | POA: Diagnosis not present

## 2019-10-29 DIAGNOSIS — S42251D Displaced fracture of greater tuberosity of right humerus, subsequent encounter for fracture with routine healing: Secondary | ICD-10-CM | POA: Diagnosis not present

## 2019-11-01 DIAGNOSIS — R1319 Other dysphagia: Secondary | ICD-10-CM | POA: Diagnosis not present

## 2019-11-01 DIAGNOSIS — R279 Unspecified lack of coordination: Secondary | ICD-10-CM | POA: Diagnosis not present

## 2019-11-01 DIAGNOSIS — R1311 Dysphagia, oral phase: Secondary | ICD-10-CM | POA: Diagnosis not present

## 2019-11-01 DIAGNOSIS — S42251D Displaced fracture of greater tuberosity of right humerus, subsequent encounter for fracture with routine healing: Secondary | ICD-10-CM | POA: Diagnosis not present

## 2019-11-01 DIAGNOSIS — M6281 Muscle weakness (generalized): Secondary | ICD-10-CM | POA: Diagnosis not present

## 2019-11-02 DIAGNOSIS — R279 Unspecified lack of coordination: Secondary | ICD-10-CM | POA: Diagnosis not present

## 2019-11-02 DIAGNOSIS — S42251D Displaced fracture of greater tuberosity of right humerus, subsequent encounter for fracture with routine healing: Secondary | ICD-10-CM | POA: Diagnosis not present

## 2019-11-02 DIAGNOSIS — R1311 Dysphagia, oral phase: Secondary | ICD-10-CM | POA: Diagnosis not present

## 2019-11-02 DIAGNOSIS — R1319 Other dysphagia: Secondary | ICD-10-CM | POA: Diagnosis not present

## 2019-11-02 DIAGNOSIS — M6281 Muscle weakness (generalized): Secondary | ICD-10-CM | POA: Diagnosis not present

## 2019-11-03 DIAGNOSIS — S42251D Displaced fracture of greater tuberosity of right humerus, subsequent encounter for fracture with routine healing: Secondary | ICD-10-CM | POA: Diagnosis not present

## 2019-11-03 DIAGNOSIS — R1319 Other dysphagia: Secondary | ICD-10-CM | POA: Diagnosis not present

## 2019-11-03 DIAGNOSIS — R279 Unspecified lack of coordination: Secondary | ICD-10-CM | POA: Diagnosis not present

## 2019-11-03 DIAGNOSIS — R1311 Dysphagia, oral phase: Secondary | ICD-10-CM | POA: Diagnosis not present

## 2019-11-03 DIAGNOSIS — M6281 Muscle weakness (generalized): Secondary | ICD-10-CM | POA: Diagnosis not present

## 2019-11-04 DIAGNOSIS — R1319 Other dysphagia: Secondary | ICD-10-CM | POA: Diagnosis not present

## 2019-11-04 DIAGNOSIS — M6281 Muscle weakness (generalized): Secondary | ICD-10-CM | POA: Diagnosis not present

## 2019-11-04 DIAGNOSIS — S42251D Displaced fracture of greater tuberosity of right humerus, subsequent encounter for fracture with routine healing: Secondary | ICD-10-CM | POA: Diagnosis not present

## 2019-11-04 DIAGNOSIS — R279 Unspecified lack of coordination: Secondary | ICD-10-CM | POA: Diagnosis not present

## 2019-11-04 DIAGNOSIS — R1311 Dysphagia, oral phase: Secondary | ICD-10-CM | POA: Diagnosis not present

## 2019-11-05 DIAGNOSIS — M6281 Muscle weakness (generalized): Secondary | ICD-10-CM | POA: Diagnosis not present

## 2019-11-05 DIAGNOSIS — S42251D Displaced fracture of greater tuberosity of right humerus, subsequent encounter for fracture with routine healing: Secondary | ICD-10-CM | POA: Diagnosis not present

## 2019-11-05 DIAGNOSIS — R279 Unspecified lack of coordination: Secondary | ICD-10-CM | POA: Diagnosis not present

## 2019-11-05 DIAGNOSIS — R1311 Dysphagia, oral phase: Secondary | ICD-10-CM | POA: Diagnosis not present

## 2019-11-05 DIAGNOSIS — R1319 Other dysphagia: Secondary | ICD-10-CM | POA: Diagnosis not present

## 2019-11-07 DIAGNOSIS — R1319 Other dysphagia: Secondary | ICD-10-CM | POA: Diagnosis not present

## 2019-11-07 DIAGNOSIS — R279 Unspecified lack of coordination: Secondary | ICD-10-CM | POA: Diagnosis not present

## 2019-11-07 DIAGNOSIS — S42251D Displaced fracture of greater tuberosity of right humerus, subsequent encounter for fracture with routine healing: Secondary | ICD-10-CM | POA: Diagnosis not present

## 2019-11-07 DIAGNOSIS — M6281 Muscle weakness (generalized): Secondary | ICD-10-CM | POA: Diagnosis not present

## 2019-11-07 DIAGNOSIS — R1311 Dysphagia, oral phase: Secondary | ICD-10-CM | POA: Diagnosis not present

## 2019-11-08 DIAGNOSIS — R279 Unspecified lack of coordination: Secondary | ICD-10-CM | POA: Diagnosis not present

## 2019-11-08 DIAGNOSIS — R1311 Dysphagia, oral phase: Secondary | ICD-10-CM | POA: Diagnosis not present

## 2019-11-08 DIAGNOSIS — R1319 Other dysphagia: Secondary | ICD-10-CM | POA: Diagnosis not present

## 2019-11-08 DIAGNOSIS — M6281 Muscle weakness (generalized): Secondary | ICD-10-CM | POA: Diagnosis not present

## 2019-11-08 DIAGNOSIS — S42251D Displaced fracture of greater tuberosity of right humerus, subsequent encounter for fracture with routine healing: Secondary | ICD-10-CM | POA: Diagnosis not present

## 2019-11-09 DIAGNOSIS — S42251D Displaced fracture of greater tuberosity of right humerus, subsequent encounter for fracture with routine healing: Secondary | ICD-10-CM | POA: Diagnosis not present

## 2019-11-09 DIAGNOSIS — R1311 Dysphagia, oral phase: Secondary | ICD-10-CM | POA: Diagnosis not present

## 2019-11-09 DIAGNOSIS — R279 Unspecified lack of coordination: Secondary | ICD-10-CM | POA: Diagnosis not present

## 2019-11-09 DIAGNOSIS — M6281 Muscle weakness (generalized): Secondary | ICD-10-CM | POA: Diagnosis not present

## 2019-11-09 DIAGNOSIS — R1319 Other dysphagia: Secondary | ICD-10-CM | POA: Diagnosis not present

## 2019-11-10 DIAGNOSIS — R279 Unspecified lack of coordination: Secondary | ICD-10-CM | POA: Diagnosis not present

## 2019-11-10 DIAGNOSIS — R1319 Other dysphagia: Secondary | ICD-10-CM | POA: Diagnosis not present

## 2019-11-10 DIAGNOSIS — M6281 Muscle weakness (generalized): Secondary | ICD-10-CM | POA: Diagnosis not present

## 2019-11-10 DIAGNOSIS — R1311 Dysphagia, oral phase: Secondary | ICD-10-CM | POA: Diagnosis not present

## 2019-11-10 DIAGNOSIS — S42251D Displaced fracture of greater tuberosity of right humerus, subsequent encounter for fracture with routine healing: Secondary | ICD-10-CM | POA: Diagnosis not present

## 2019-11-11 DIAGNOSIS — M6281 Muscle weakness (generalized): Secondary | ICD-10-CM | POA: Diagnosis not present

## 2019-11-11 DIAGNOSIS — R1319 Other dysphagia: Secondary | ICD-10-CM | POA: Diagnosis not present

## 2019-11-11 DIAGNOSIS — R279 Unspecified lack of coordination: Secondary | ICD-10-CM | POA: Diagnosis not present

## 2019-11-11 DIAGNOSIS — R1311 Dysphagia, oral phase: Secondary | ICD-10-CM | POA: Diagnosis not present

## 2019-11-11 DIAGNOSIS — S42251D Displaced fracture of greater tuberosity of right humerus, subsequent encounter for fracture with routine healing: Secondary | ICD-10-CM | POA: Diagnosis not present

## 2019-11-12 DIAGNOSIS — M6281 Muscle weakness (generalized): Secondary | ICD-10-CM | POA: Diagnosis not present

## 2019-11-12 DIAGNOSIS — R1319 Other dysphagia: Secondary | ICD-10-CM | POA: Diagnosis not present

## 2019-11-12 DIAGNOSIS — R279 Unspecified lack of coordination: Secondary | ICD-10-CM | POA: Diagnosis not present

## 2019-11-12 DIAGNOSIS — S42251D Displaced fracture of greater tuberosity of right humerus, subsequent encounter for fracture with routine healing: Secondary | ICD-10-CM | POA: Diagnosis not present

## 2019-11-12 DIAGNOSIS — R1311 Dysphagia, oral phase: Secondary | ICD-10-CM | POA: Diagnosis not present

## 2019-11-15 DIAGNOSIS — R1319 Other dysphagia: Secondary | ICD-10-CM | POA: Diagnosis not present

## 2019-11-15 DIAGNOSIS — R279 Unspecified lack of coordination: Secondary | ICD-10-CM | POA: Diagnosis not present

## 2019-11-15 DIAGNOSIS — R1311 Dysphagia, oral phase: Secondary | ICD-10-CM | POA: Diagnosis not present

## 2019-11-15 DIAGNOSIS — S42251D Displaced fracture of greater tuberosity of right humerus, subsequent encounter for fracture with routine healing: Secondary | ICD-10-CM | POA: Diagnosis not present

## 2019-11-15 DIAGNOSIS — M6281 Muscle weakness (generalized): Secondary | ICD-10-CM | POA: Diagnosis not present

## 2019-11-16 DIAGNOSIS — M6281 Muscle weakness (generalized): Secondary | ICD-10-CM | POA: Diagnosis not present

## 2019-11-16 DIAGNOSIS — R1311 Dysphagia, oral phase: Secondary | ICD-10-CM | POA: Diagnosis not present

## 2019-11-16 DIAGNOSIS — R279 Unspecified lack of coordination: Secondary | ICD-10-CM | POA: Diagnosis not present

## 2019-11-16 DIAGNOSIS — R1319 Other dysphagia: Secondary | ICD-10-CM | POA: Diagnosis not present

## 2019-11-16 DIAGNOSIS — S42251D Displaced fracture of greater tuberosity of right humerus, subsequent encounter for fracture with routine healing: Secondary | ICD-10-CM | POA: Diagnosis not present

## 2019-11-17 DIAGNOSIS — S42251D Displaced fracture of greater tuberosity of right humerus, subsequent encounter for fracture with routine healing: Secondary | ICD-10-CM | POA: Diagnosis not present

## 2019-11-17 DIAGNOSIS — M6281 Muscle weakness (generalized): Secondary | ICD-10-CM | POA: Diagnosis not present

## 2019-11-17 DIAGNOSIS — R1311 Dysphagia, oral phase: Secondary | ICD-10-CM | POA: Diagnosis not present

## 2019-11-17 DIAGNOSIS — R279 Unspecified lack of coordination: Secondary | ICD-10-CM | POA: Diagnosis not present

## 2019-11-17 DIAGNOSIS — R1319 Other dysphagia: Secondary | ICD-10-CM | POA: Diagnosis not present

## 2019-11-18 DIAGNOSIS — R1319 Other dysphagia: Secondary | ICD-10-CM | POA: Diagnosis not present

## 2019-11-18 DIAGNOSIS — S42251D Displaced fracture of greater tuberosity of right humerus, subsequent encounter for fracture with routine healing: Secondary | ICD-10-CM | POA: Diagnosis not present

## 2019-11-18 DIAGNOSIS — R1311 Dysphagia, oral phase: Secondary | ICD-10-CM | POA: Diagnosis not present

## 2019-11-18 DIAGNOSIS — M6281 Muscle weakness (generalized): Secondary | ICD-10-CM | POA: Diagnosis not present

## 2019-11-18 DIAGNOSIS — R279 Unspecified lack of coordination: Secondary | ICD-10-CM | POA: Diagnosis not present

## 2019-11-22 DIAGNOSIS — R1319 Other dysphagia: Secondary | ICD-10-CM | POA: Diagnosis not present

## 2019-11-22 DIAGNOSIS — R1311 Dysphagia, oral phase: Secondary | ICD-10-CM | POA: Diagnosis not present

## 2019-11-22 DIAGNOSIS — S42251D Displaced fracture of greater tuberosity of right humerus, subsequent encounter for fracture with routine healing: Secondary | ICD-10-CM | POA: Diagnosis not present

## 2019-11-22 DIAGNOSIS — M6281 Muscle weakness (generalized): Secondary | ICD-10-CM | POA: Diagnosis not present

## 2019-11-22 DIAGNOSIS — R279 Unspecified lack of coordination: Secondary | ICD-10-CM | POA: Diagnosis not present

## 2019-11-23 DIAGNOSIS — R1311 Dysphagia, oral phase: Secondary | ICD-10-CM | POA: Diagnosis not present

## 2019-11-23 DIAGNOSIS — M6281 Muscle weakness (generalized): Secondary | ICD-10-CM | POA: Diagnosis not present

## 2019-11-23 DIAGNOSIS — S42251D Displaced fracture of greater tuberosity of right humerus, subsequent encounter for fracture with routine healing: Secondary | ICD-10-CM | POA: Diagnosis not present

## 2019-11-23 DIAGNOSIS — R1319 Other dysphagia: Secondary | ICD-10-CM | POA: Diagnosis not present

## 2019-11-23 DIAGNOSIS — R279 Unspecified lack of coordination: Secondary | ICD-10-CM | POA: Diagnosis not present

## 2019-11-24 DIAGNOSIS — M6281 Muscle weakness (generalized): Secondary | ICD-10-CM | POA: Diagnosis not present

## 2019-11-24 DIAGNOSIS — R1311 Dysphagia, oral phase: Secondary | ICD-10-CM | POA: Diagnosis not present

## 2019-11-24 DIAGNOSIS — S42251D Displaced fracture of greater tuberosity of right humerus, subsequent encounter for fracture with routine healing: Secondary | ICD-10-CM | POA: Diagnosis not present

## 2019-11-24 DIAGNOSIS — R1319 Other dysphagia: Secondary | ICD-10-CM | POA: Diagnosis not present

## 2019-11-24 DIAGNOSIS — R279 Unspecified lack of coordination: Secondary | ICD-10-CM | POA: Diagnosis not present

## 2019-11-25 DIAGNOSIS — S42251D Displaced fracture of greater tuberosity of right humerus, subsequent encounter for fracture with routine healing: Secondary | ICD-10-CM | POA: Diagnosis not present

## 2019-11-25 DIAGNOSIS — R1319 Other dysphagia: Secondary | ICD-10-CM | POA: Diagnosis not present

## 2019-11-25 DIAGNOSIS — R279 Unspecified lack of coordination: Secondary | ICD-10-CM | POA: Diagnosis not present

## 2019-11-25 DIAGNOSIS — M6281 Muscle weakness (generalized): Secondary | ICD-10-CM | POA: Diagnosis not present

## 2019-11-25 DIAGNOSIS — R1311 Dysphagia, oral phase: Secondary | ICD-10-CM | POA: Diagnosis not present

## 2019-11-26 DIAGNOSIS — R1311 Dysphagia, oral phase: Secondary | ICD-10-CM | POA: Diagnosis not present

## 2019-11-26 DIAGNOSIS — R279 Unspecified lack of coordination: Secondary | ICD-10-CM | POA: Diagnosis not present

## 2019-11-26 DIAGNOSIS — S42251D Displaced fracture of greater tuberosity of right humerus, subsequent encounter for fracture with routine healing: Secondary | ICD-10-CM | POA: Diagnosis not present

## 2019-11-26 DIAGNOSIS — M6281 Muscle weakness (generalized): Secondary | ICD-10-CM | POA: Diagnosis not present

## 2019-11-26 DIAGNOSIS — R1319 Other dysphagia: Secondary | ICD-10-CM | POA: Diagnosis not present

## 2019-11-28 DIAGNOSIS — R1311 Dysphagia, oral phase: Secondary | ICD-10-CM | POA: Diagnosis not present

## 2019-11-28 DIAGNOSIS — R279 Unspecified lack of coordination: Secondary | ICD-10-CM | POA: Diagnosis not present

## 2019-11-28 DIAGNOSIS — R1319 Other dysphagia: Secondary | ICD-10-CM | POA: Diagnosis not present

## 2019-11-28 DIAGNOSIS — M6281 Muscle weakness (generalized): Secondary | ICD-10-CM | POA: Diagnosis not present

## 2019-11-28 DIAGNOSIS — S42251D Displaced fracture of greater tuberosity of right humerus, subsequent encounter for fracture with routine healing: Secondary | ICD-10-CM | POA: Diagnosis not present

## 2019-11-29 DIAGNOSIS — R1319 Other dysphagia: Secondary | ICD-10-CM | POA: Diagnosis not present

## 2019-11-29 DIAGNOSIS — M6281 Muscle weakness (generalized): Secondary | ICD-10-CM | POA: Diagnosis not present

## 2019-11-29 DIAGNOSIS — R279 Unspecified lack of coordination: Secondary | ICD-10-CM | POA: Diagnosis not present

## 2019-11-29 DIAGNOSIS — S42251D Displaced fracture of greater tuberosity of right humerus, subsequent encounter for fracture with routine healing: Secondary | ICD-10-CM | POA: Diagnosis not present

## 2019-11-29 DIAGNOSIS — R1311 Dysphagia, oral phase: Secondary | ICD-10-CM | POA: Diagnosis not present

## 2019-12-01 DIAGNOSIS — R279 Unspecified lack of coordination: Secondary | ICD-10-CM | POA: Diagnosis not present

## 2019-12-01 DIAGNOSIS — R1319 Other dysphagia: Secondary | ICD-10-CM | POA: Diagnosis not present

## 2019-12-01 DIAGNOSIS — M6281 Muscle weakness (generalized): Secondary | ICD-10-CM | POA: Diagnosis not present

## 2019-12-01 DIAGNOSIS — R1311 Dysphagia, oral phase: Secondary | ICD-10-CM | POA: Diagnosis not present

## 2019-12-01 DIAGNOSIS — S42251D Displaced fracture of greater tuberosity of right humerus, subsequent encounter for fracture with routine healing: Secondary | ICD-10-CM | POA: Diagnosis not present

## 2019-12-02 DIAGNOSIS — M6281 Muscle weakness (generalized): Secondary | ICD-10-CM | POA: Diagnosis not present

## 2019-12-02 DIAGNOSIS — R1319 Other dysphagia: Secondary | ICD-10-CM | POA: Diagnosis not present

## 2019-12-02 DIAGNOSIS — S42251D Displaced fracture of greater tuberosity of right humerus, subsequent encounter for fracture with routine healing: Secondary | ICD-10-CM | POA: Diagnosis not present

## 2019-12-02 DIAGNOSIS — R1311 Dysphagia, oral phase: Secondary | ICD-10-CM | POA: Diagnosis not present

## 2019-12-02 DIAGNOSIS — R279 Unspecified lack of coordination: Secondary | ICD-10-CM | POA: Diagnosis not present

## 2019-12-08 ENCOUNTER — Inpatient Hospital Stay: Payer: Medicare Other

## 2019-12-08 ENCOUNTER — Inpatient Hospital Stay: Payer: Medicare Other | Attending: Hematology and Oncology | Admitting: Hematology and Oncology

## 2019-12-14 DIAGNOSIS — E0590 Thyrotoxicosis, unspecified without thyrotoxic crisis or storm: Secondary | ICD-10-CM | POA: Diagnosis not present

## 2019-12-14 DIAGNOSIS — I10 Essential (primary) hypertension: Secondary | ICD-10-CM | POA: Diagnosis not present

## 2019-12-14 DIAGNOSIS — D649 Anemia, unspecified: Secondary | ICD-10-CM | POA: Diagnosis not present

## 2019-12-20 DIAGNOSIS — E559 Vitamin D deficiency, unspecified: Secondary | ICD-10-CM | POA: Diagnosis not present

## 2019-12-20 DIAGNOSIS — I10 Essential (primary) hypertension: Secondary | ICD-10-CM | POA: Diagnosis not present

## 2019-12-20 DIAGNOSIS — D6489 Other specified anemias: Secondary | ICD-10-CM | POA: Diagnosis not present

## 2019-12-20 DIAGNOSIS — G4700 Insomnia, unspecified: Secondary | ICD-10-CM | POA: Diagnosis not present

## 2019-12-20 DIAGNOSIS — D529 Folate deficiency anemia, unspecified: Secondary | ICD-10-CM | POA: Diagnosis not present

## 2019-12-21 DIAGNOSIS — D649 Anemia, unspecified: Secondary | ICD-10-CM | POA: Diagnosis not present

## 2019-12-21 DIAGNOSIS — E0590 Thyrotoxicosis, unspecified without thyrotoxic crisis or storm: Secondary | ICD-10-CM | POA: Diagnosis not present

## 2019-12-23 DIAGNOSIS — R262 Difficulty in walking, not elsewhere classified: Secondary | ICD-10-CM | POA: Diagnosis not present

## 2019-12-23 DIAGNOSIS — R2681 Unsteadiness on feet: Secondary | ICD-10-CM | POA: Diagnosis not present

## 2019-12-23 DIAGNOSIS — M6281 Muscle weakness (generalized): Secondary | ICD-10-CM | POA: Diagnosis not present

## 2019-12-23 DIAGNOSIS — R296 Repeated falls: Secondary | ICD-10-CM | POA: Diagnosis not present

## 2019-12-23 DIAGNOSIS — R1312 Dysphagia, oropharyngeal phase: Secondary | ICD-10-CM | POA: Diagnosis not present

## 2019-12-23 DIAGNOSIS — S42251D Displaced fracture of greater tuberosity of right humerus, subsequent encounter for fracture with routine healing: Secondary | ICD-10-CM | POA: Diagnosis not present

## 2019-12-23 DIAGNOSIS — K219 Gastro-esophageal reflux disease without esophagitis: Secondary | ICD-10-CM | POA: Diagnosis not present

## 2019-12-24 DIAGNOSIS — R296 Repeated falls: Secondary | ICD-10-CM | POA: Diagnosis not present

## 2019-12-24 DIAGNOSIS — R262 Difficulty in walking, not elsewhere classified: Secondary | ICD-10-CM | POA: Diagnosis not present

## 2019-12-24 DIAGNOSIS — M6281 Muscle weakness (generalized): Secondary | ICD-10-CM | POA: Diagnosis not present

## 2019-12-24 DIAGNOSIS — R1312 Dysphagia, oropharyngeal phase: Secondary | ICD-10-CM | POA: Diagnosis not present

## 2019-12-24 DIAGNOSIS — R2681 Unsteadiness on feet: Secondary | ICD-10-CM | POA: Diagnosis not present

## 2019-12-24 DIAGNOSIS — K219 Gastro-esophageal reflux disease without esophagitis: Secondary | ICD-10-CM | POA: Diagnosis not present

## 2019-12-24 DIAGNOSIS — S42251D Displaced fracture of greater tuberosity of right humerus, subsequent encounter for fracture with routine healing: Secondary | ICD-10-CM | POA: Diagnosis not present

## 2019-12-27 DIAGNOSIS — E0590 Thyrotoxicosis, unspecified without thyrotoxic crisis or storm: Secondary | ICD-10-CM | POA: Diagnosis not present

## 2019-12-27 DIAGNOSIS — D529 Folate deficiency anemia, unspecified: Secondary | ICD-10-CM | POA: Diagnosis not present

## 2019-12-27 DIAGNOSIS — L89153 Pressure ulcer of sacral region, stage 3: Secondary | ICD-10-CM | POA: Diagnosis not present

## 2019-12-28 DIAGNOSIS — D649 Anemia, unspecified: Secondary | ICD-10-CM | POA: Diagnosis not present

## 2019-12-28 DIAGNOSIS — E559 Vitamin D deficiency, unspecified: Secondary | ICD-10-CM | POA: Diagnosis not present

## 2019-12-30 DIAGNOSIS — D529 Folate deficiency anemia, unspecified: Secondary | ICD-10-CM | POA: Diagnosis not present

## 2019-12-31 DIAGNOSIS — S81812A Laceration without foreign body, left lower leg, initial encounter: Secondary | ICD-10-CM | POA: Diagnosis not present

## 2019-12-31 DIAGNOSIS — S51019A Laceration without foreign body of unspecified elbow, initial encounter: Secondary | ICD-10-CM | POA: Diagnosis not present

## 2019-12-31 DIAGNOSIS — Z9181 History of falling: Secondary | ICD-10-CM | POA: Diagnosis not present

## 2019-12-31 DIAGNOSIS — D649 Anemia, unspecified: Secondary | ICD-10-CM | POA: Diagnosis not present

## 2020-01-03 DIAGNOSIS — G4700 Insomnia, unspecified: Secondary | ICD-10-CM | POA: Diagnosis not present

## 2020-01-05 DIAGNOSIS — M6281 Muscle weakness (generalized): Secondary | ICD-10-CM | POA: Diagnosis not present

## 2020-01-05 DIAGNOSIS — R2681 Unsteadiness on feet: Secondary | ICD-10-CM | POA: Diagnosis not present

## 2020-01-05 DIAGNOSIS — R262 Difficulty in walking, not elsewhere classified: Secondary | ICD-10-CM | POA: Diagnosis not present

## 2020-01-05 DIAGNOSIS — S42251D Displaced fracture of greater tuberosity of right humerus, subsequent encounter for fracture with routine healing: Secondary | ICD-10-CM | POA: Diagnosis not present

## 2020-01-05 DIAGNOSIS — R296 Repeated falls: Secondary | ICD-10-CM | POA: Diagnosis not present

## 2020-01-05 DIAGNOSIS — R1312 Dysphagia, oropharyngeal phase: Secondary | ICD-10-CM | POA: Diagnosis not present

## 2020-01-05 DIAGNOSIS — K219 Gastro-esophageal reflux disease without esophagitis: Secondary | ICD-10-CM | POA: Diagnosis not present

## 2020-01-06 DIAGNOSIS — R1312 Dysphagia, oropharyngeal phase: Secondary | ICD-10-CM | POA: Diagnosis not present

## 2020-01-06 DIAGNOSIS — K219 Gastro-esophageal reflux disease without esophagitis: Secondary | ICD-10-CM | POA: Diagnosis not present

## 2020-01-06 DIAGNOSIS — R2681 Unsteadiness on feet: Secondary | ICD-10-CM | POA: Diagnosis not present

## 2020-01-06 DIAGNOSIS — M6281 Muscle weakness (generalized): Secondary | ICD-10-CM | POA: Diagnosis not present

## 2020-01-06 DIAGNOSIS — S42251D Displaced fracture of greater tuberosity of right humerus, subsequent encounter for fracture with routine healing: Secondary | ICD-10-CM | POA: Diagnosis not present

## 2020-01-06 DIAGNOSIS — R296 Repeated falls: Secondary | ICD-10-CM | POA: Diagnosis not present

## 2020-01-06 DIAGNOSIS — R262 Difficulty in walking, not elsewhere classified: Secondary | ICD-10-CM | POA: Diagnosis not present

## 2020-01-08 DIAGNOSIS — R2681 Unsteadiness on feet: Secondary | ICD-10-CM | POA: Diagnosis not present

## 2020-01-08 DIAGNOSIS — K219 Gastro-esophageal reflux disease without esophagitis: Secondary | ICD-10-CM | POA: Diagnosis not present

## 2020-01-08 DIAGNOSIS — M6281 Muscle weakness (generalized): Secondary | ICD-10-CM | POA: Diagnosis not present

## 2020-01-08 DIAGNOSIS — R1312 Dysphagia, oropharyngeal phase: Secondary | ICD-10-CM | POA: Diagnosis not present

## 2020-01-08 DIAGNOSIS — S42251D Displaced fracture of greater tuberosity of right humerus, subsequent encounter for fracture with routine healing: Secondary | ICD-10-CM | POA: Diagnosis not present

## 2020-01-08 DIAGNOSIS — R296 Repeated falls: Secondary | ICD-10-CM | POA: Diagnosis not present

## 2020-01-08 DIAGNOSIS — R262 Difficulty in walking, not elsewhere classified: Secondary | ICD-10-CM | POA: Diagnosis not present

## 2020-01-11 DIAGNOSIS — R2681 Unsteadiness on feet: Secondary | ICD-10-CM | POA: Diagnosis not present

## 2020-01-11 DIAGNOSIS — R1312 Dysphagia, oropharyngeal phase: Secondary | ICD-10-CM | POA: Diagnosis not present

## 2020-01-11 DIAGNOSIS — K219 Gastro-esophageal reflux disease without esophagitis: Secondary | ICD-10-CM | POA: Diagnosis not present

## 2020-01-11 DIAGNOSIS — S42251D Displaced fracture of greater tuberosity of right humerus, subsequent encounter for fracture with routine healing: Secondary | ICD-10-CM | POA: Diagnosis not present

## 2020-01-11 DIAGNOSIS — R296 Repeated falls: Secondary | ICD-10-CM | POA: Diagnosis not present

## 2020-01-11 DIAGNOSIS — R262 Difficulty in walking, not elsewhere classified: Secondary | ICD-10-CM | POA: Diagnosis not present

## 2020-01-11 DIAGNOSIS — M6281 Muscle weakness (generalized): Secondary | ICD-10-CM | POA: Diagnosis not present

## 2020-01-12 DIAGNOSIS — R1312 Dysphagia, oropharyngeal phase: Secondary | ICD-10-CM | POA: Diagnosis not present

## 2020-01-12 DIAGNOSIS — R296 Repeated falls: Secondary | ICD-10-CM | POA: Diagnosis not present

## 2020-01-12 DIAGNOSIS — R262 Difficulty in walking, not elsewhere classified: Secondary | ICD-10-CM | POA: Diagnosis not present

## 2020-01-12 DIAGNOSIS — S42251D Displaced fracture of greater tuberosity of right humerus, subsequent encounter for fracture with routine healing: Secondary | ICD-10-CM | POA: Diagnosis not present

## 2020-01-12 DIAGNOSIS — M6281 Muscle weakness (generalized): Secondary | ICD-10-CM | POA: Diagnosis not present

## 2020-01-12 DIAGNOSIS — K219 Gastro-esophageal reflux disease without esophagitis: Secondary | ICD-10-CM | POA: Diagnosis not present

## 2020-01-12 DIAGNOSIS — R2681 Unsteadiness on feet: Secondary | ICD-10-CM | POA: Diagnosis not present

## 2020-01-13 DIAGNOSIS — R1312 Dysphagia, oropharyngeal phase: Secondary | ICD-10-CM | POA: Diagnosis not present

## 2020-01-13 DIAGNOSIS — R296 Repeated falls: Secondary | ICD-10-CM | POA: Diagnosis not present

## 2020-01-13 DIAGNOSIS — M6281 Muscle weakness (generalized): Secondary | ICD-10-CM | POA: Diagnosis not present

## 2020-01-13 DIAGNOSIS — K219 Gastro-esophageal reflux disease without esophagitis: Secondary | ICD-10-CM | POA: Diagnosis not present

## 2020-01-13 DIAGNOSIS — S42251D Displaced fracture of greater tuberosity of right humerus, subsequent encounter for fracture with routine healing: Secondary | ICD-10-CM | POA: Diagnosis not present

## 2020-01-13 DIAGNOSIS — R2681 Unsteadiness on feet: Secondary | ICD-10-CM | POA: Diagnosis not present

## 2020-01-13 DIAGNOSIS — R262 Difficulty in walking, not elsewhere classified: Secondary | ICD-10-CM | POA: Diagnosis not present

## 2020-01-14 DIAGNOSIS — R262 Difficulty in walking, not elsewhere classified: Secondary | ICD-10-CM | POA: Diagnosis not present

## 2020-01-14 DIAGNOSIS — R1312 Dysphagia, oropharyngeal phase: Secondary | ICD-10-CM | POA: Diagnosis not present

## 2020-01-14 DIAGNOSIS — S42251D Displaced fracture of greater tuberosity of right humerus, subsequent encounter for fracture with routine healing: Secondary | ICD-10-CM | POA: Diagnosis not present

## 2020-01-14 DIAGNOSIS — K219 Gastro-esophageal reflux disease without esophagitis: Secondary | ICD-10-CM | POA: Diagnosis not present

## 2020-01-14 DIAGNOSIS — R2681 Unsteadiness on feet: Secondary | ICD-10-CM | POA: Diagnosis not present

## 2020-01-14 DIAGNOSIS — M6281 Muscle weakness (generalized): Secondary | ICD-10-CM | POA: Diagnosis not present

## 2020-01-14 DIAGNOSIS — R296 Repeated falls: Secondary | ICD-10-CM | POA: Diagnosis not present

## 2020-01-16 DIAGNOSIS — R2681 Unsteadiness on feet: Secondary | ICD-10-CM | POA: Diagnosis not present

## 2020-01-16 DIAGNOSIS — S42251D Displaced fracture of greater tuberosity of right humerus, subsequent encounter for fracture with routine healing: Secondary | ICD-10-CM | POA: Diagnosis not present

## 2020-01-16 DIAGNOSIS — M6281 Muscle weakness (generalized): Secondary | ICD-10-CM | POA: Diagnosis not present

## 2020-01-16 DIAGNOSIS — K219 Gastro-esophageal reflux disease without esophagitis: Secondary | ICD-10-CM | POA: Diagnosis not present

## 2020-01-16 DIAGNOSIS — R262 Difficulty in walking, not elsewhere classified: Secondary | ICD-10-CM | POA: Diagnosis not present

## 2020-01-16 DIAGNOSIS — R1312 Dysphagia, oropharyngeal phase: Secondary | ICD-10-CM | POA: Diagnosis not present

## 2020-01-16 DIAGNOSIS — R296 Repeated falls: Secondary | ICD-10-CM | POA: Diagnosis not present

## 2020-01-18 DIAGNOSIS — R296 Repeated falls: Secondary | ICD-10-CM | POA: Diagnosis not present

## 2020-01-18 DIAGNOSIS — R2681 Unsteadiness on feet: Secondary | ICD-10-CM | POA: Diagnosis not present

## 2020-01-18 DIAGNOSIS — K219 Gastro-esophageal reflux disease without esophagitis: Secondary | ICD-10-CM | POA: Diagnosis not present

## 2020-01-18 DIAGNOSIS — M6281 Muscle weakness (generalized): Secondary | ICD-10-CM | POA: Diagnosis not present

## 2020-01-18 DIAGNOSIS — R1312 Dysphagia, oropharyngeal phase: Secondary | ICD-10-CM | POA: Diagnosis not present

## 2020-01-18 DIAGNOSIS — S42251D Displaced fracture of greater tuberosity of right humerus, subsequent encounter for fracture with routine healing: Secondary | ICD-10-CM | POA: Diagnosis not present

## 2020-01-18 DIAGNOSIS — R262 Difficulty in walking, not elsewhere classified: Secondary | ICD-10-CM | POA: Diagnosis not present

## 2020-01-19 DIAGNOSIS — R1312 Dysphagia, oropharyngeal phase: Secondary | ICD-10-CM | POA: Diagnosis not present

## 2020-01-19 DIAGNOSIS — S42251D Displaced fracture of greater tuberosity of right humerus, subsequent encounter for fracture with routine healing: Secondary | ICD-10-CM | POA: Diagnosis not present

## 2020-01-19 DIAGNOSIS — K219 Gastro-esophageal reflux disease without esophagitis: Secondary | ICD-10-CM | POA: Diagnosis not present

## 2020-01-19 DIAGNOSIS — R262 Difficulty in walking, not elsewhere classified: Secondary | ICD-10-CM | POA: Diagnosis not present

## 2020-01-19 DIAGNOSIS — R2681 Unsteadiness on feet: Secondary | ICD-10-CM | POA: Diagnosis not present

## 2020-01-19 DIAGNOSIS — M6281 Muscle weakness (generalized): Secondary | ICD-10-CM | POA: Diagnosis not present

## 2020-01-19 DIAGNOSIS — R296 Repeated falls: Secondary | ICD-10-CM | POA: Diagnosis not present

## 2020-01-20 DIAGNOSIS — R2681 Unsteadiness on feet: Secondary | ICD-10-CM | POA: Diagnosis not present

## 2020-01-20 DIAGNOSIS — R1311 Dysphagia, oral phase: Secondary | ICD-10-CM | POA: Diagnosis not present

## 2020-01-20 DIAGNOSIS — M6281 Muscle weakness (generalized): Secondary | ICD-10-CM | POA: Diagnosis not present

## 2020-01-20 DIAGNOSIS — R296 Repeated falls: Secondary | ICD-10-CM | POA: Diagnosis not present

## 2020-01-20 DIAGNOSIS — R1312 Dysphagia, oropharyngeal phase: Secondary | ICD-10-CM | POA: Diagnosis not present

## 2020-01-20 DIAGNOSIS — R262 Difficulty in walking, not elsewhere classified: Secondary | ICD-10-CM | POA: Diagnosis not present

## 2020-01-21 DIAGNOSIS — E0590 Thyrotoxicosis, unspecified without thyrotoxic crisis or storm: Secondary | ICD-10-CM | POA: Diagnosis not present

## 2020-01-23 DIAGNOSIS — R2681 Unsteadiness on feet: Secondary | ICD-10-CM | POA: Diagnosis not present

## 2020-01-23 DIAGNOSIS — R296 Repeated falls: Secondary | ICD-10-CM | POA: Diagnosis not present

## 2020-01-23 DIAGNOSIS — R1311 Dysphagia, oral phase: Secondary | ICD-10-CM | POA: Diagnosis not present

## 2020-01-23 DIAGNOSIS — R1312 Dysphagia, oropharyngeal phase: Secondary | ICD-10-CM | POA: Diagnosis not present

## 2020-01-23 DIAGNOSIS — M6281 Muscle weakness (generalized): Secondary | ICD-10-CM | POA: Diagnosis not present

## 2020-01-23 DIAGNOSIS — R262 Difficulty in walking, not elsewhere classified: Secondary | ICD-10-CM | POA: Diagnosis not present

## 2020-01-24 DIAGNOSIS — R2681 Unsteadiness on feet: Secondary | ICD-10-CM | POA: Diagnosis not present

## 2020-01-24 DIAGNOSIS — M6281 Muscle weakness (generalized): Secondary | ICD-10-CM | POA: Diagnosis not present

## 2020-01-24 DIAGNOSIS — R1311 Dysphagia, oral phase: Secondary | ICD-10-CM | POA: Diagnosis not present

## 2020-01-24 DIAGNOSIS — R262 Difficulty in walking, not elsewhere classified: Secondary | ICD-10-CM | POA: Diagnosis not present

## 2020-01-24 DIAGNOSIS — R1312 Dysphagia, oropharyngeal phase: Secondary | ICD-10-CM | POA: Diagnosis not present

## 2020-01-24 DIAGNOSIS — R296 Repeated falls: Secondary | ICD-10-CM | POA: Diagnosis not present

## 2020-01-25 DIAGNOSIS — R1312 Dysphagia, oropharyngeal phase: Secondary | ICD-10-CM | POA: Diagnosis not present

## 2020-01-25 DIAGNOSIS — R262 Difficulty in walking, not elsewhere classified: Secondary | ICD-10-CM | POA: Diagnosis not present

## 2020-01-25 DIAGNOSIS — R2681 Unsteadiness on feet: Secondary | ICD-10-CM | POA: Diagnosis not present

## 2020-01-25 DIAGNOSIS — M6281 Muscle weakness (generalized): Secondary | ICD-10-CM | POA: Diagnosis not present

## 2020-01-25 DIAGNOSIS — R296 Repeated falls: Secondary | ICD-10-CM | POA: Diagnosis not present

## 2020-01-25 DIAGNOSIS — R1311 Dysphagia, oral phase: Secondary | ICD-10-CM | POA: Diagnosis not present

## 2020-01-26 DIAGNOSIS — R2681 Unsteadiness on feet: Secondary | ICD-10-CM | POA: Diagnosis not present

## 2020-01-26 DIAGNOSIS — R1312 Dysphagia, oropharyngeal phase: Secondary | ICD-10-CM | POA: Diagnosis not present

## 2020-01-26 DIAGNOSIS — R296 Repeated falls: Secondary | ICD-10-CM | POA: Diagnosis not present

## 2020-01-26 DIAGNOSIS — R262 Difficulty in walking, not elsewhere classified: Secondary | ICD-10-CM | POA: Diagnosis not present

## 2020-01-26 DIAGNOSIS — M6281 Muscle weakness (generalized): Secondary | ICD-10-CM | POA: Diagnosis not present

## 2020-01-26 DIAGNOSIS — R1311 Dysphagia, oral phase: Secondary | ICD-10-CM | POA: Diagnosis not present

## 2020-01-27 DIAGNOSIS — R296 Repeated falls: Secondary | ICD-10-CM | POA: Diagnosis not present

## 2020-01-27 DIAGNOSIS — R262 Difficulty in walking, not elsewhere classified: Secondary | ICD-10-CM | POA: Diagnosis not present

## 2020-01-27 DIAGNOSIS — R1311 Dysphagia, oral phase: Secondary | ICD-10-CM | POA: Diagnosis not present

## 2020-01-27 DIAGNOSIS — R2681 Unsteadiness on feet: Secondary | ICD-10-CM | POA: Diagnosis not present

## 2020-01-27 DIAGNOSIS — R1312 Dysphagia, oropharyngeal phase: Secondary | ICD-10-CM | POA: Diagnosis not present

## 2020-01-27 DIAGNOSIS — M6281 Muscle weakness (generalized): Secondary | ICD-10-CM | POA: Diagnosis not present

## 2020-01-28 DIAGNOSIS — R1312 Dysphagia, oropharyngeal phase: Secondary | ICD-10-CM | POA: Diagnosis not present

## 2020-01-28 DIAGNOSIS — R262 Difficulty in walking, not elsewhere classified: Secondary | ICD-10-CM | POA: Diagnosis not present

## 2020-01-28 DIAGNOSIS — R1311 Dysphagia, oral phase: Secondary | ICD-10-CM | POA: Diagnosis not present

## 2020-01-28 DIAGNOSIS — R296 Repeated falls: Secondary | ICD-10-CM | POA: Diagnosis not present

## 2020-01-28 DIAGNOSIS — M6281 Muscle weakness (generalized): Secondary | ICD-10-CM | POA: Diagnosis not present

## 2020-01-28 DIAGNOSIS — R2681 Unsteadiness on feet: Secondary | ICD-10-CM | POA: Diagnosis not present

## 2020-01-29 DIAGNOSIS — R1311 Dysphagia, oral phase: Secondary | ICD-10-CM | POA: Diagnosis not present

## 2020-01-29 DIAGNOSIS — R2681 Unsteadiness on feet: Secondary | ICD-10-CM | POA: Diagnosis not present

## 2020-01-29 DIAGNOSIS — M6281 Muscle weakness (generalized): Secondary | ICD-10-CM | POA: Diagnosis not present

## 2020-01-29 DIAGNOSIS — R262 Difficulty in walking, not elsewhere classified: Secondary | ICD-10-CM | POA: Diagnosis not present

## 2020-01-29 DIAGNOSIS — R1312 Dysphagia, oropharyngeal phase: Secondary | ICD-10-CM | POA: Diagnosis not present

## 2020-01-29 DIAGNOSIS — R296 Repeated falls: Secondary | ICD-10-CM | POA: Diagnosis not present

## 2020-01-30 DIAGNOSIS — R296 Repeated falls: Secondary | ICD-10-CM | POA: Diagnosis not present

## 2020-01-30 DIAGNOSIS — R1311 Dysphagia, oral phase: Secondary | ICD-10-CM | POA: Diagnosis not present

## 2020-01-30 DIAGNOSIS — M6281 Muscle weakness (generalized): Secondary | ICD-10-CM | POA: Diagnosis not present

## 2020-01-30 DIAGNOSIS — R2681 Unsteadiness on feet: Secondary | ICD-10-CM | POA: Diagnosis not present

## 2020-01-30 DIAGNOSIS — R262 Difficulty in walking, not elsewhere classified: Secondary | ICD-10-CM | POA: Diagnosis not present

## 2020-01-30 DIAGNOSIS — R1312 Dysphagia, oropharyngeal phase: Secondary | ICD-10-CM | POA: Diagnosis not present

## 2020-01-31 DIAGNOSIS — R2681 Unsteadiness on feet: Secondary | ICD-10-CM | POA: Diagnosis not present

## 2020-01-31 DIAGNOSIS — R1311 Dysphagia, oral phase: Secondary | ICD-10-CM | POA: Diagnosis not present

## 2020-01-31 DIAGNOSIS — M6281 Muscle weakness (generalized): Secondary | ICD-10-CM | POA: Diagnosis not present

## 2020-01-31 DIAGNOSIS — R296 Repeated falls: Secondary | ICD-10-CM | POA: Diagnosis not present

## 2020-01-31 DIAGNOSIS — G4700 Insomnia, unspecified: Secondary | ICD-10-CM | POA: Diagnosis not present

## 2020-01-31 DIAGNOSIS — R262 Difficulty in walking, not elsewhere classified: Secondary | ICD-10-CM | POA: Diagnosis not present

## 2020-01-31 DIAGNOSIS — R1312 Dysphagia, oropharyngeal phase: Secondary | ICD-10-CM | POA: Diagnosis not present

## 2020-02-01 DIAGNOSIS — R1311 Dysphagia, oral phase: Secondary | ICD-10-CM | POA: Diagnosis not present

## 2020-02-01 DIAGNOSIS — R1312 Dysphagia, oropharyngeal phase: Secondary | ICD-10-CM | POA: Diagnosis not present

## 2020-02-01 DIAGNOSIS — M6281 Muscle weakness (generalized): Secondary | ICD-10-CM | POA: Diagnosis not present

## 2020-02-01 DIAGNOSIS — R262 Difficulty in walking, not elsewhere classified: Secondary | ICD-10-CM | POA: Diagnosis not present

## 2020-02-01 DIAGNOSIS — R2681 Unsteadiness on feet: Secondary | ICD-10-CM | POA: Diagnosis not present

## 2020-02-01 DIAGNOSIS — R296 Repeated falls: Secondary | ICD-10-CM | POA: Diagnosis not present

## 2020-02-02 DIAGNOSIS — R296 Repeated falls: Secondary | ICD-10-CM | POA: Diagnosis not present

## 2020-02-02 DIAGNOSIS — M6281 Muscle weakness (generalized): Secondary | ICD-10-CM | POA: Diagnosis not present

## 2020-02-02 DIAGNOSIS — R2681 Unsteadiness on feet: Secondary | ICD-10-CM | POA: Diagnosis not present

## 2020-02-02 DIAGNOSIS — R1312 Dysphagia, oropharyngeal phase: Secondary | ICD-10-CM | POA: Diagnosis not present

## 2020-02-02 DIAGNOSIS — R1311 Dysphagia, oral phase: Secondary | ICD-10-CM | POA: Diagnosis not present

## 2020-02-02 DIAGNOSIS — R262 Difficulty in walking, not elsewhere classified: Secondary | ICD-10-CM | POA: Diagnosis not present

## 2020-02-04 DIAGNOSIS — R296 Repeated falls: Secondary | ICD-10-CM | POA: Diagnosis not present

## 2020-02-04 DIAGNOSIS — R1311 Dysphagia, oral phase: Secondary | ICD-10-CM | POA: Diagnosis not present

## 2020-02-04 DIAGNOSIS — R2681 Unsteadiness on feet: Secondary | ICD-10-CM | POA: Diagnosis not present

## 2020-02-04 DIAGNOSIS — R262 Difficulty in walking, not elsewhere classified: Secondary | ICD-10-CM | POA: Diagnosis not present

## 2020-02-04 DIAGNOSIS — R1312 Dysphagia, oropharyngeal phase: Secondary | ICD-10-CM | POA: Diagnosis not present

## 2020-02-04 DIAGNOSIS — M6281 Muscle weakness (generalized): Secondary | ICD-10-CM | POA: Diagnosis not present

## 2020-02-05 DIAGNOSIS — R2681 Unsteadiness on feet: Secondary | ICD-10-CM | POA: Diagnosis not present

## 2020-02-05 DIAGNOSIS — R1312 Dysphagia, oropharyngeal phase: Secondary | ICD-10-CM | POA: Diagnosis not present

## 2020-02-05 DIAGNOSIS — R296 Repeated falls: Secondary | ICD-10-CM | POA: Diagnosis not present

## 2020-02-05 DIAGNOSIS — R1311 Dysphagia, oral phase: Secondary | ICD-10-CM | POA: Diagnosis not present

## 2020-02-05 DIAGNOSIS — R262 Difficulty in walking, not elsewhere classified: Secondary | ICD-10-CM | POA: Diagnosis not present

## 2020-02-05 DIAGNOSIS — M6281 Muscle weakness (generalized): Secondary | ICD-10-CM | POA: Diagnosis not present

## 2020-02-06 DIAGNOSIS — R1311 Dysphagia, oral phase: Secondary | ICD-10-CM | POA: Diagnosis not present

## 2020-02-06 DIAGNOSIS — R1312 Dysphagia, oropharyngeal phase: Secondary | ICD-10-CM | POA: Diagnosis not present

## 2020-02-06 DIAGNOSIS — M6281 Muscle weakness (generalized): Secondary | ICD-10-CM | POA: Diagnosis not present

## 2020-02-06 DIAGNOSIS — R2681 Unsteadiness on feet: Secondary | ICD-10-CM | POA: Diagnosis not present

## 2020-02-06 DIAGNOSIS — R262 Difficulty in walking, not elsewhere classified: Secondary | ICD-10-CM | POA: Diagnosis not present

## 2020-02-06 DIAGNOSIS — R296 Repeated falls: Secondary | ICD-10-CM | POA: Diagnosis not present

## 2020-02-07 DIAGNOSIS — D6489 Other specified anemias: Secondary | ICD-10-CM | POA: Diagnosis not present

## 2020-02-07 DIAGNOSIS — I10 Essential (primary) hypertension: Secondary | ICD-10-CM | POA: Diagnosis not present

## 2020-02-07 DIAGNOSIS — E0590 Thyrotoxicosis, unspecified without thyrotoxic crisis or storm: Secondary | ICD-10-CM | POA: Diagnosis not present

## 2020-02-07 DIAGNOSIS — D539 Nutritional anemia, unspecified: Secondary | ICD-10-CM | POA: Diagnosis not present

## 2020-02-07 DIAGNOSIS — D529 Folate deficiency anemia, unspecified: Secondary | ICD-10-CM | POA: Diagnosis not present

## 2020-02-07 DIAGNOSIS — E042 Nontoxic multinodular goiter: Secondary | ICD-10-CM | POA: Diagnosis not present

## 2020-02-08 DIAGNOSIS — R1311 Dysphagia, oral phase: Secondary | ICD-10-CM | POA: Diagnosis not present

## 2020-02-08 DIAGNOSIS — M6281 Muscle weakness (generalized): Secondary | ICD-10-CM | POA: Diagnosis not present

## 2020-02-08 DIAGNOSIS — R296 Repeated falls: Secondary | ICD-10-CM | POA: Diagnosis not present

## 2020-02-08 DIAGNOSIS — R1312 Dysphagia, oropharyngeal phase: Secondary | ICD-10-CM | POA: Diagnosis not present

## 2020-02-08 DIAGNOSIS — R2681 Unsteadiness on feet: Secondary | ICD-10-CM | POA: Diagnosis not present

## 2020-02-08 DIAGNOSIS — R262 Difficulty in walking, not elsewhere classified: Secondary | ICD-10-CM | POA: Diagnosis not present

## 2020-02-09 DIAGNOSIS — R296 Repeated falls: Secondary | ICD-10-CM | POA: Diagnosis not present

## 2020-02-09 DIAGNOSIS — I10 Essential (primary) hypertension: Secondary | ICD-10-CM | POA: Diagnosis not present

## 2020-02-09 DIAGNOSIS — E0590 Thyrotoxicosis, unspecified without thyrotoxic crisis or storm: Secondary | ICD-10-CM | POA: Diagnosis not present

## 2020-02-09 DIAGNOSIS — R2681 Unsteadiness on feet: Secondary | ICD-10-CM | POA: Diagnosis not present

## 2020-02-09 DIAGNOSIS — R1312 Dysphagia, oropharyngeal phase: Secondary | ICD-10-CM | POA: Diagnosis not present

## 2020-02-09 DIAGNOSIS — R262 Difficulty in walking, not elsewhere classified: Secondary | ICD-10-CM | POA: Diagnosis not present

## 2020-02-09 DIAGNOSIS — M6281 Muscle weakness (generalized): Secondary | ICD-10-CM | POA: Diagnosis not present

## 2020-02-09 DIAGNOSIS — R1311 Dysphagia, oral phase: Secondary | ICD-10-CM | POA: Diagnosis not present

## 2020-02-09 DIAGNOSIS — N390 Urinary tract infection, site not specified: Secondary | ICD-10-CM | POA: Diagnosis not present

## 2020-02-10 DIAGNOSIS — R2681 Unsteadiness on feet: Secondary | ICD-10-CM | POA: Diagnosis not present

## 2020-02-10 DIAGNOSIS — R296 Repeated falls: Secondary | ICD-10-CM | POA: Diagnosis not present

## 2020-02-10 DIAGNOSIS — R262 Difficulty in walking, not elsewhere classified: Secondary | ICD-10-CM | POA: Diagnosis not present

## 2020-02-10 DIAGNOSIS — R1312 Dysphagia, oropharyngeal phase: Secondary | ICD-10-CM | POA: Diagnosis not present

## 2020-02-10 DIAGNOSIS — M6281 Muscle weakness (generalized): Secondary | ICD-10-CM | POA: Diagnosis not present

## 2020-02-10 DIAGNOSIS — R1311 Dysphagia, oral phase: Secondary | ICD-10-CM | POA: Diagnosis not present

## 2020-02-11 DIAGNOSIS — R1312 Dysphagia, oropharyngeal phase: Secondary | ICD-10-CM | POA: Diagnosis not present

## 2020-02-11 DIAGNOSIS — N390 Urinary tract infection, site not specified: Secondary | ICD-10-CM | POA: Diagnosis not present

## 2020-02-11 DIAGNOSIS — R296 Repeated falls: Secondary | ICD-10-CM | POA: Diagnosis not present

## 2020-02-11 DIAGNOSIS — R1311 Dysphagia, oral phase: Secondary | ICD-10-CM | POA: Diagnosis not present

## 2020-02-11 DIAGNOSIS — R2681 Unsteadiness on feet: Secondary | ICD-10-CM | POA: Diagnosis not present

## 2020-02-11 DIAGNOSIS — M6281 Muscle weakness (generalized): Secondary | ICD-10-CM | POA: Diagnosis not present

## 2020-02-11 DIAGNOSIS — E860 Dehydration: Secondary | ICD-10-CM | POA: Diagnosis not present

## 2020-02-11 DIAGNOSIS — R262 Difficulty in walking, not elsewhere classified: Secondary | ICD-10-CM | POA: Diagnosis not present

## 2020-02-14 DIAGNOSIS — N390 Urinary tract infection, site not specified: Secondary | ICD-10-CM | POA: Diagnosis not present

## 2020-02-14 DIAGNOSIS — E0590 Thyrotoxicosis, unspecified without thyrotoxic crisis or storm: Secondary | ICD-10-CM | POA: Diagnosis not present

## 2020-02-14 DIAGNOSIS — I10 Essential (primary) hypertension: Secondary | ICD-10-CM | POA: Diagnosis not present

## 2020-02-14 DIAGNOSIS — E860 Dehydration: Secondary | ICD-10-CM | POA: Diagnosis not present

## 2020-02-15 DIAGNOSIS — E871 Hypo-osmolality and hyponatremia: Secondary | ICD-10-CM | POA: Diagnosis not present

## 2020-02-15 DIAGNOSIS — J440 Chronic obstructive pulmonary disease with acute lower respiratory infection: Secondary | ICD-10-CM | POA: Diagnosis not present

## 2020-02-16 DIAGNOSIS — E860 Dehydration: Secondary | ICD-10-CM | POA: Diagnosis not present

## 2020-02-16 DIAGNOSIS — I959 Hypotension, unspecified: Secondary | ICD-10-CM | POA: Diagnosis not present

## 2020-09-15 IMAGING — CR DG RIBS W/ CHEST 3+V*R*
3 series · 3 of 3 positions shown · non-contrast
Comparison: 03/31/2018 chest radiograph.

CLINICAL DATA: Right-sided pain after fall

EXAM:
RIGHT RIBS AND CHEST - 3+ VIEW

[chest ap]
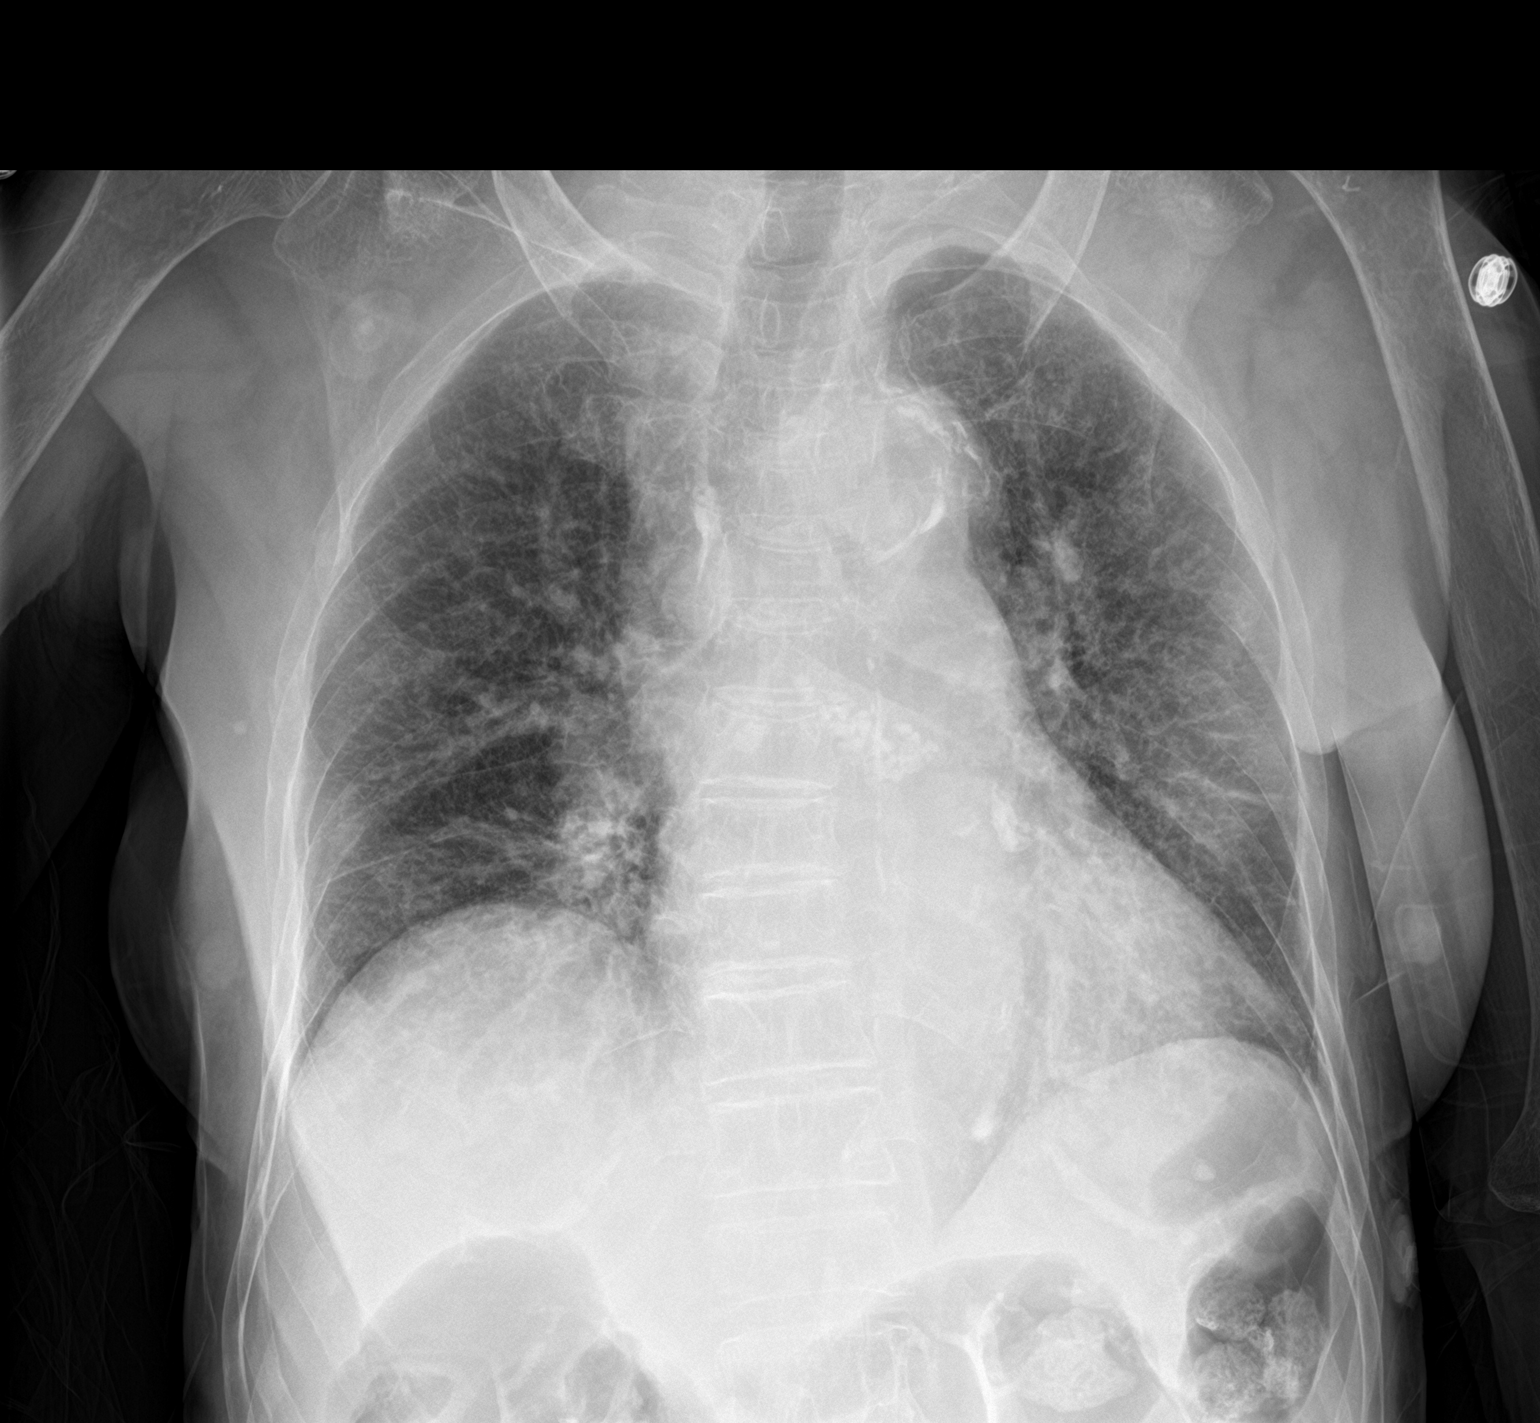

[rib ap]
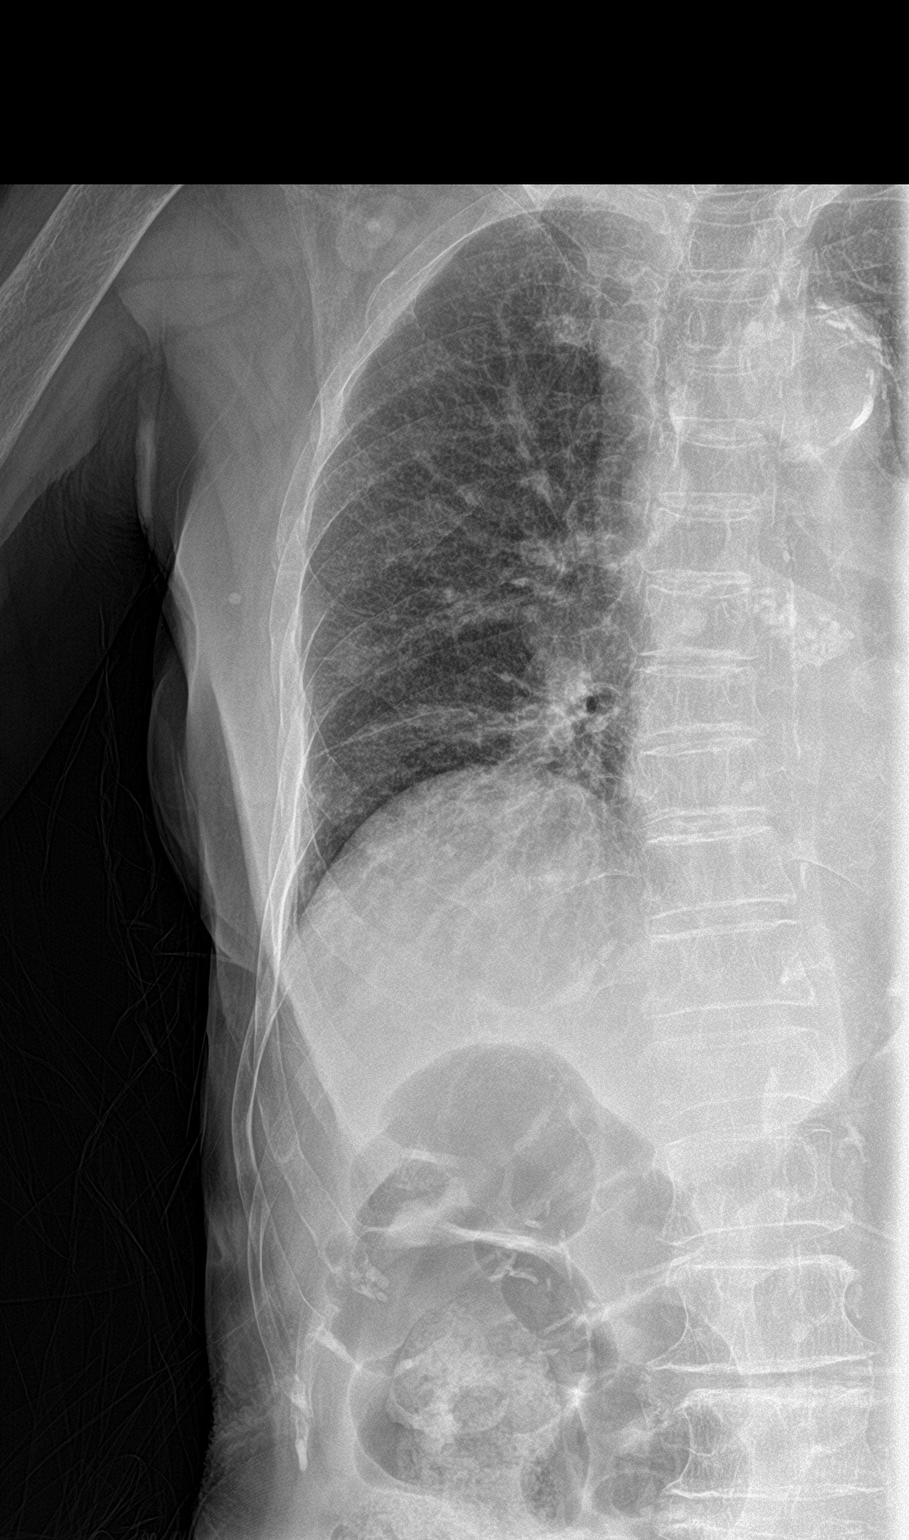

[rib ap obl]
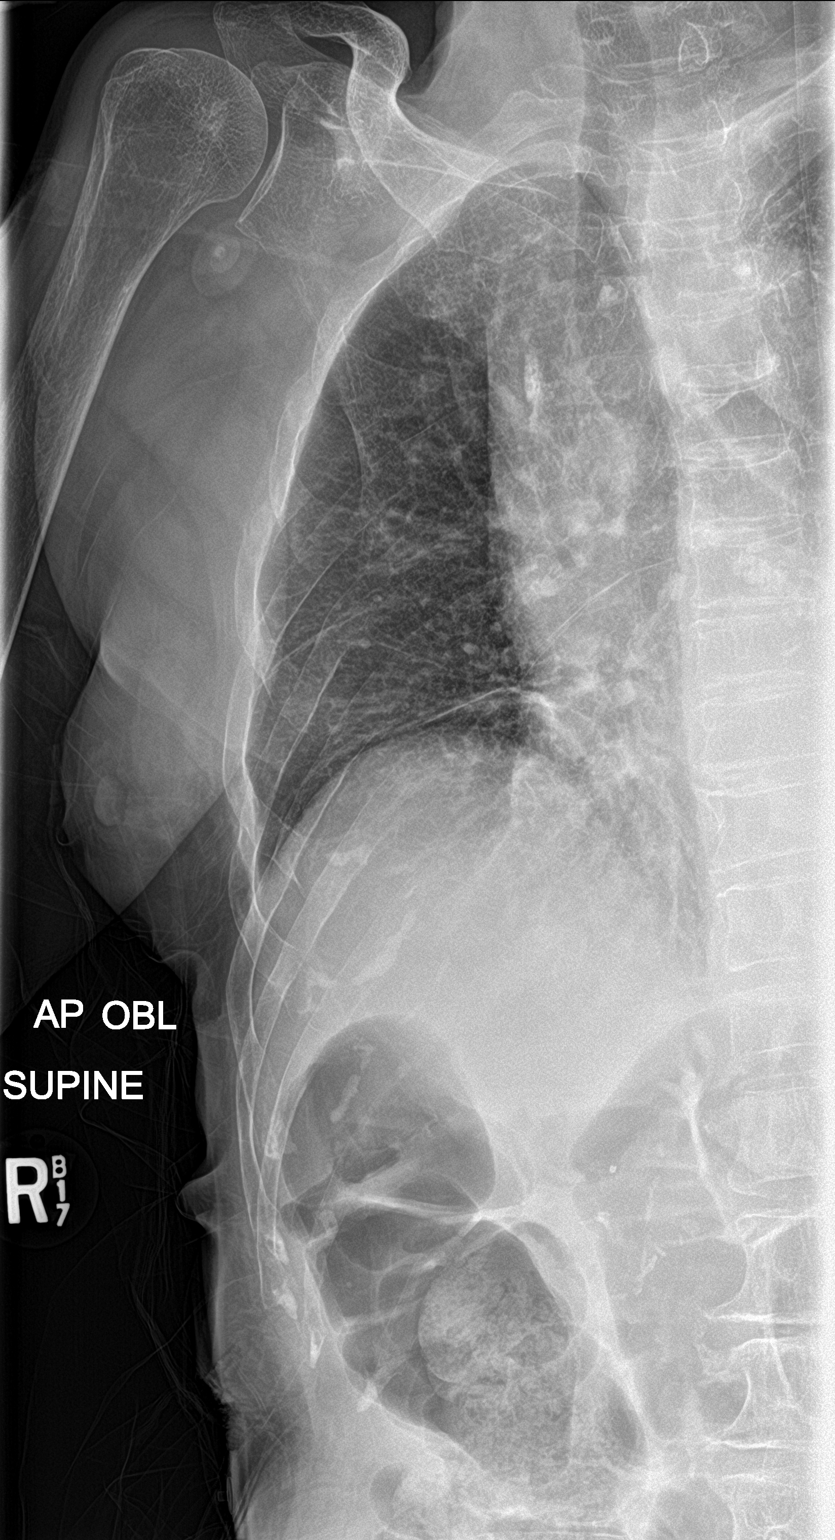

[3 of 3 positions shown; findings below may reference images not displayed]

FINDINGS: Stable cardiomediastinal silhouette with mild cardiomegaly. No
pneumothorax. No pleural effusion. Cephalization of the pulmonary
vasculature without overt pulmonary edema. No acute consolidative
airspace disease. Acute nondisplaced lateral right third and fourth
rib fractures. No suspicious focal osseous lesions.
IMPRESSION: Acute nondisplaced lateral right third and fourth rib fractures. No
pneumothorax. No hemothorax. Stable mild cardiomegaly without overt
pulmonary edema.

## 2020-09-15 IMAGING — CT CT ABD-PELV W/ CM
2 of 5 series · 16 of 46 positions shown, 18 images · IV contrast (omnipaque)
Comparison: 07/14/2018

CLINICAL DATA: Abdominal pain, fall

EXAM:
CT ABDOMEN AND PELVIS WITH CONTRAST
TECHNIQUE: Multidetector CT imaging of the abdomen and pelvis was performed
using the standard protocol following bolus administration of
intravenous contrast.
CONTRAST:  100mL OMNIPAQUE IOHEXOL 300 MG/ML  SOLN

[Series 3: a/p w/ 5mm · axial · 0.65mm/px · z∈[-476,-76]mm · 13 of 90 slices shown, 15 images]
[im 5/90  soft-tissue]
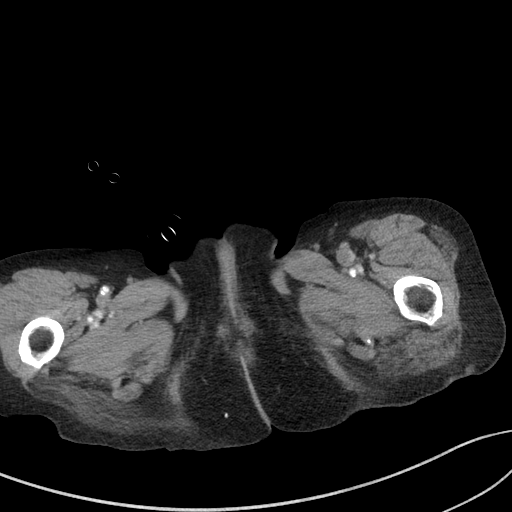
[im 5/90  bone]
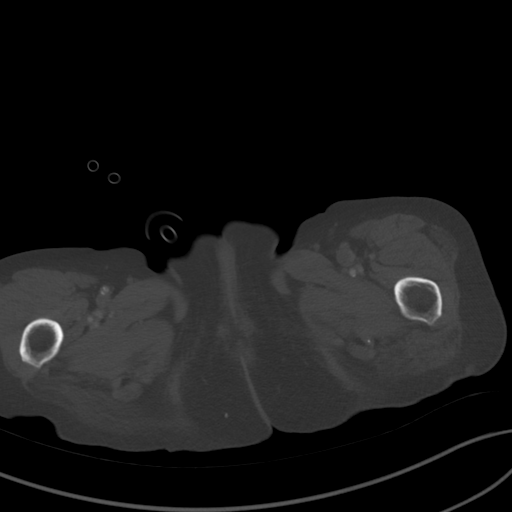
[im 14/90  soft-tissue]
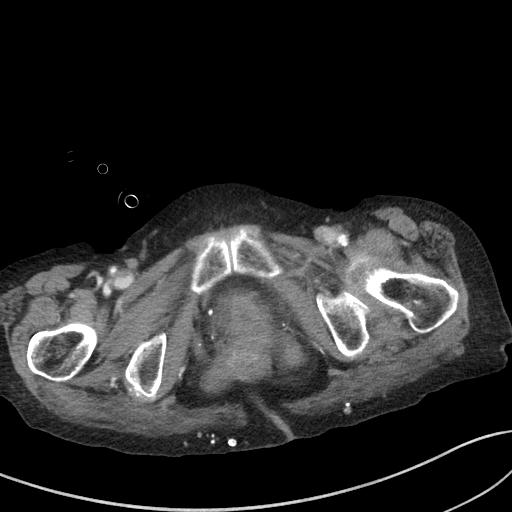
[im 18/90  soft-tissue]
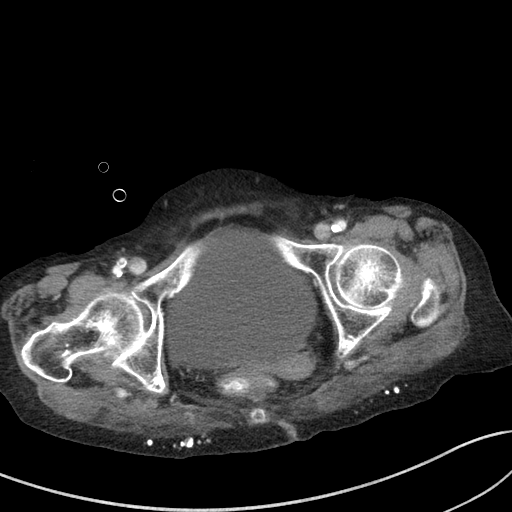
[im 27/90  soft-tissue]
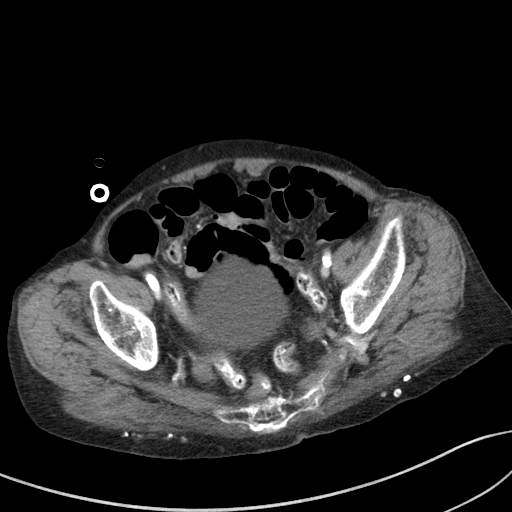
[im 32/90  soft-tissue]
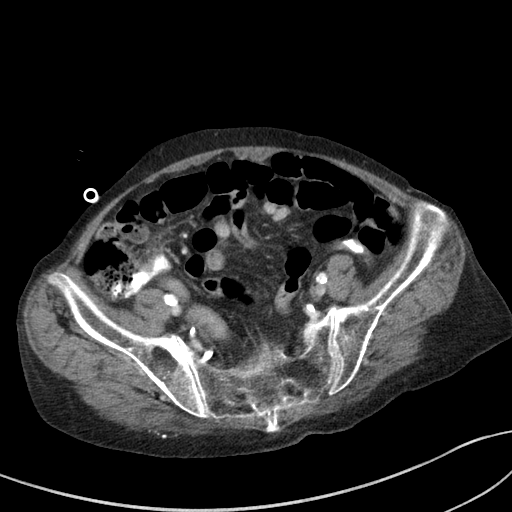
[im 41/90  soft-tissue]
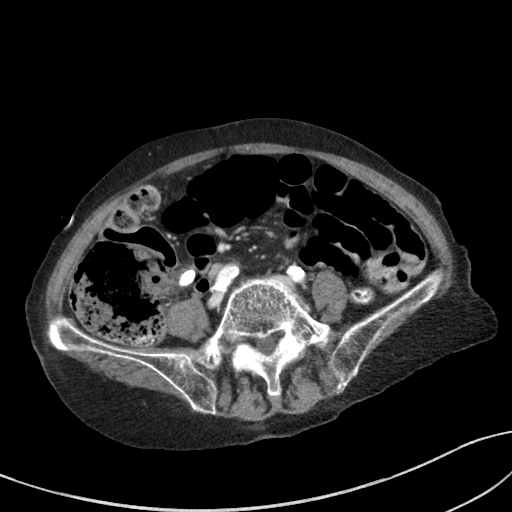
[im 45/90  soft-tissue]
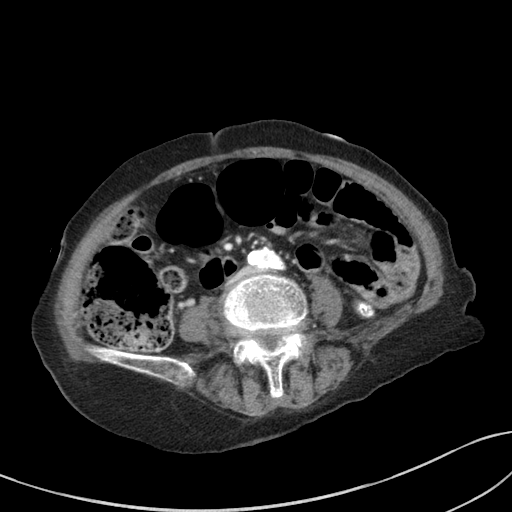
[im 49/90  soft-tissue]
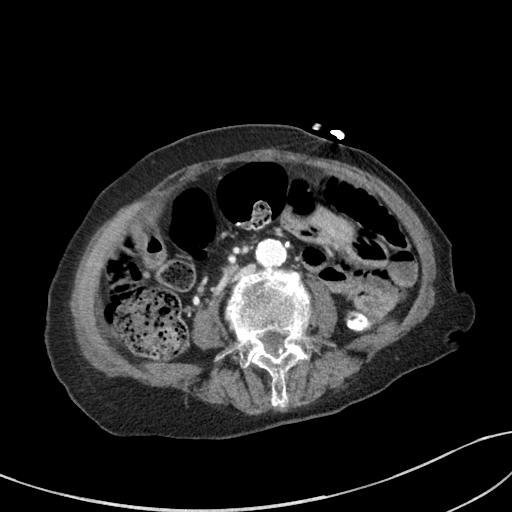
[im 58/90  soft-tissue]
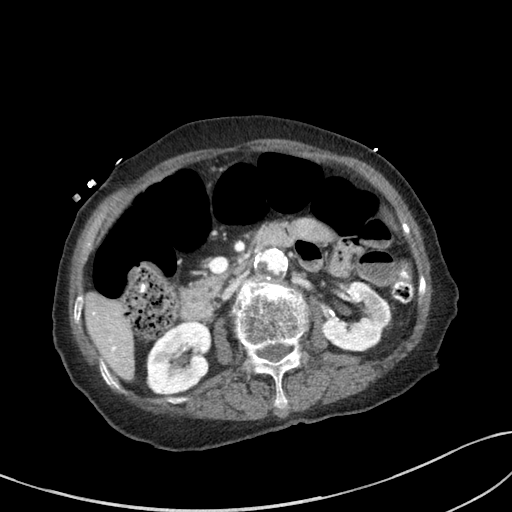
[im 58/90  bone]
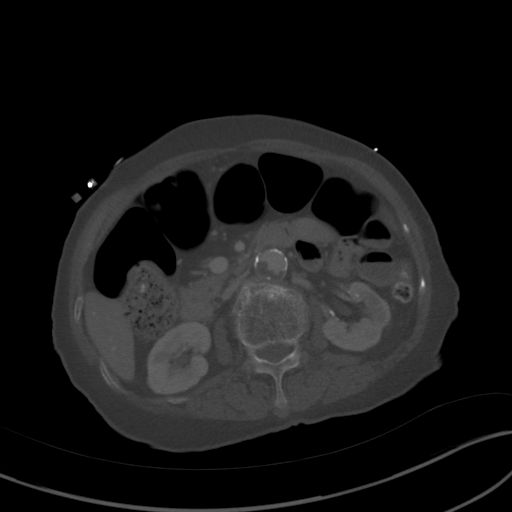
[im 63/90  soft-tissue]
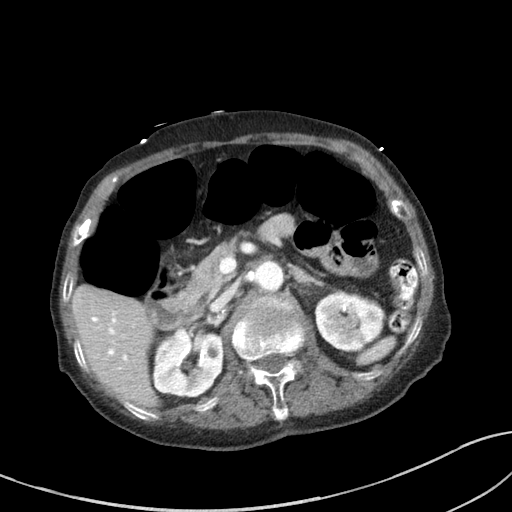
[im 72/90  soft-tissue]
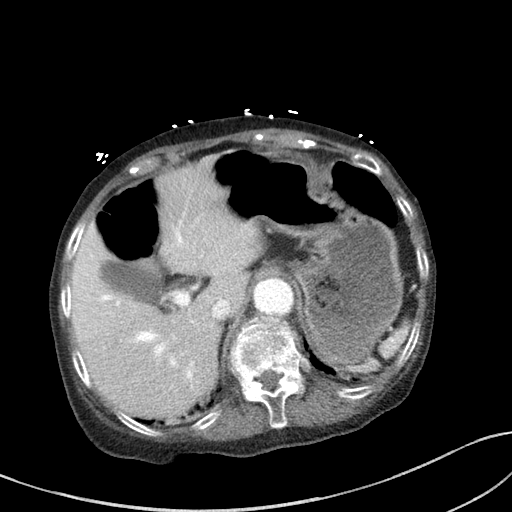
[im 76/90  soft-tissue]
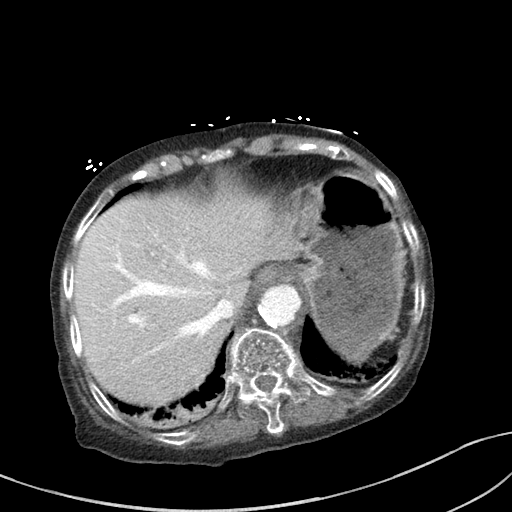
[im 85/90  soft-tissue]
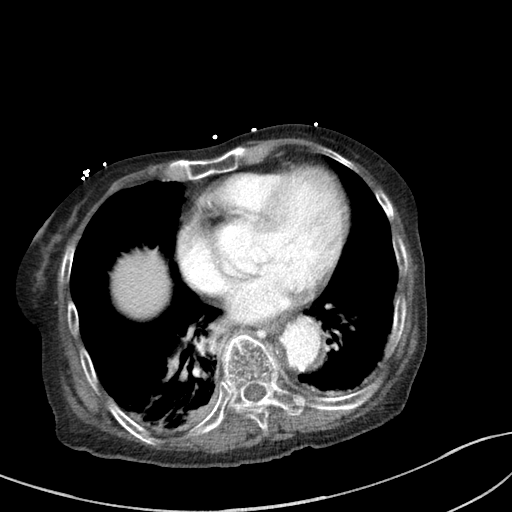

[Series 6: a/p w/ cor · coronal · 0.66mm/px · 3 of 106 slices shown]
[im 36/106  soft-tissue]
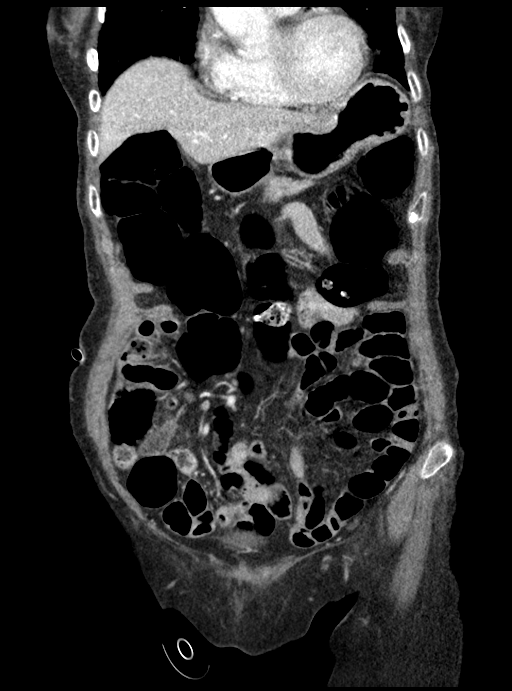
[im 47/106  soft-tissue]
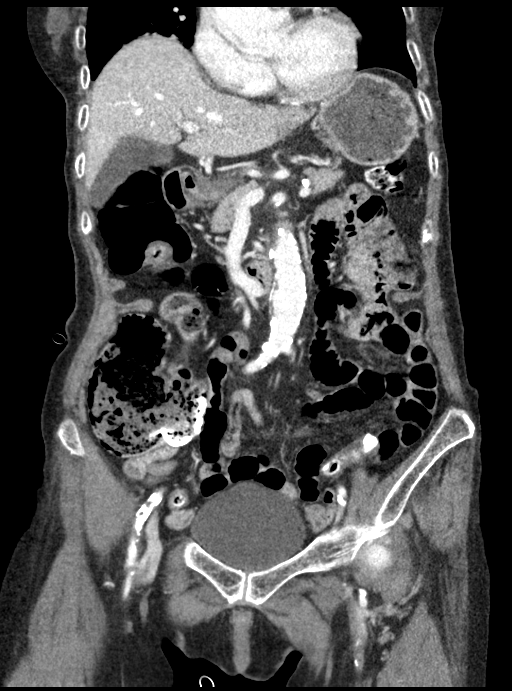
[im 59/106  soft-tissue]
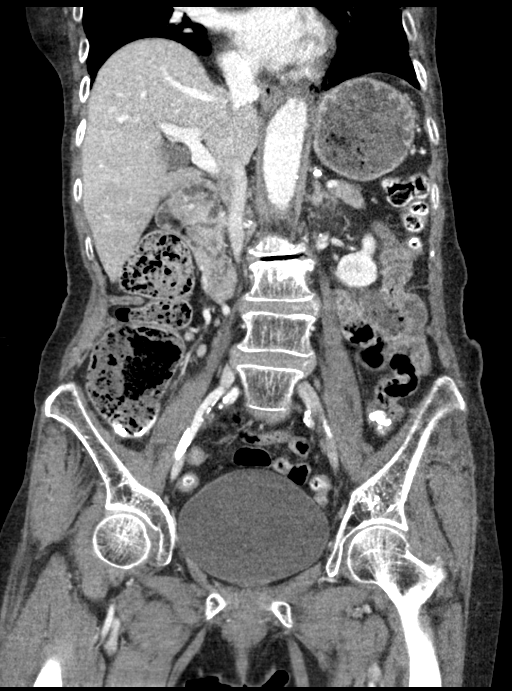

[16 of 46 positions shown; findings below may reference images not displayed]

FINDINGS: Motion degraded images.

Lower chest: Mild patchy bilateral lower lobe opacities, right
greater than left, likely atelectasis.

Hepatobiliary: Scattered subcentimeter hepatic cysts.

Gallbladder is unremarkable. No intrahepatic or extrahepatic ductal
dilatation.

Pancreas: Within normal limits.

Spleen: Within normal limits.

Adrenals/Urinary Tract: Adrenal glands are within normal limits.

7 mm cyst in the medial right upper kidney (series 8/image 10). Left
kidney is within normal limits. No hydronephrosis.

Bladder is within normal limits.

Stomach/Bowel: Stomach is within normal limits.

No evidence of bowel obstruction.

Appendix is not discretely visualized, reportedly surgically
absent..

Left colon is decompressed.

Vascular/Lymphatic: No evidence of abdominal aortic aneurysm.

Atherosclerotic calcifications of the abdominal aorta and branch
vessels.

No suspicious abdominopelvic lymphadenopathy.

Reproductive: Uterus and bilateral ovaries are grossly unremarkable.

Other: No abdominopelvic ascites.

Musculoskeletal: Mild to moderate compression fracture deformity at
T12, with approximately 30% loss of height (sagittal image 87).
Minimal retropulsion. This is new from recent CT.

Mild superior endplate changes at L3, unchanged.

Mild degenerative changes of the visualized thoracolumbar spine.
IMPRESSION: Mild to moderate compression fracture deformity at T12, new from
recent CT. Minimal retropulsion.

Additional stable ancillary findings as above.

## 2021-07-21 DEATH — deceased

## 2021-07-24 IMAGING — DX PORTABLE ABDOMEN - 1 VIEW
2 series · 2 of 2 positions shown · non-contrast
Comparison: 03/31/2018

CLINICAL DATA: Constipation

EXAM:
PORTABLE ABDOMEN - 1 VIEW

[abdomen kub (1 of 2)]
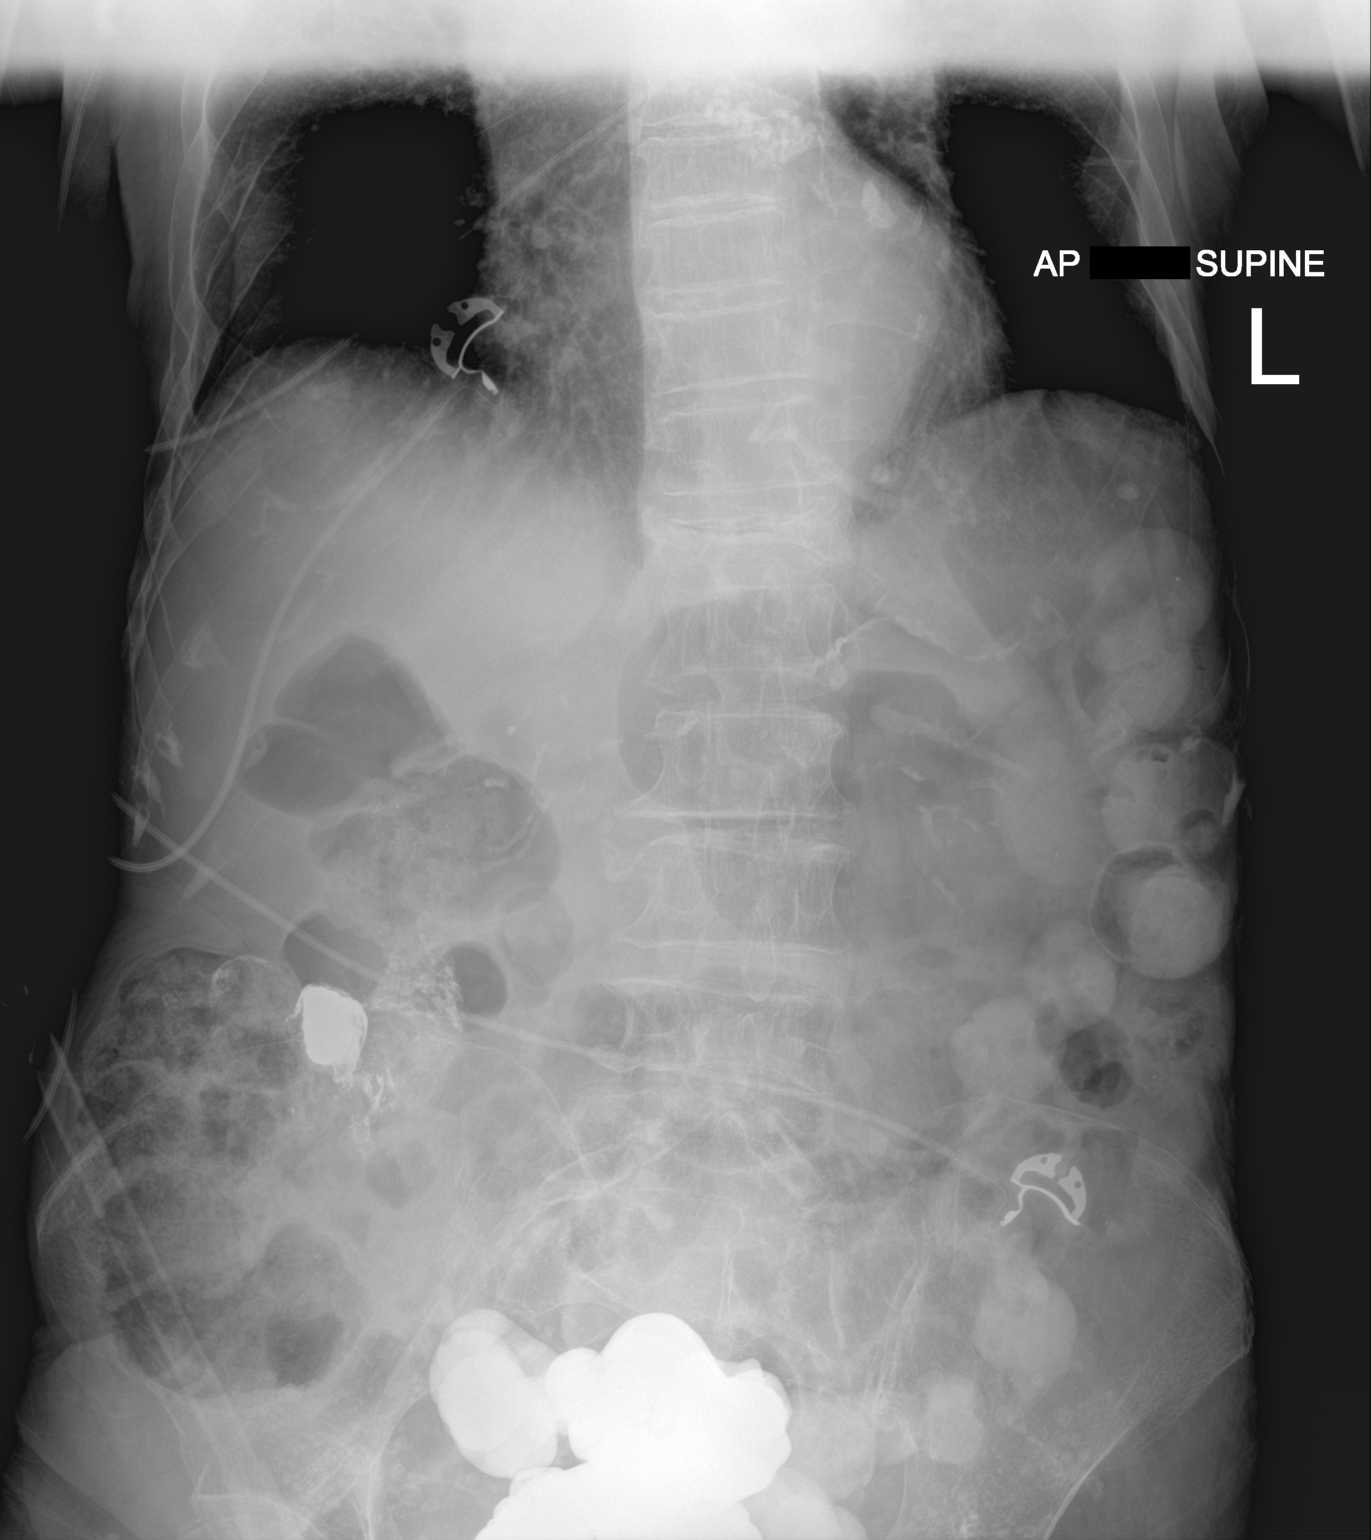

[abdomen kub (2 of 2)]
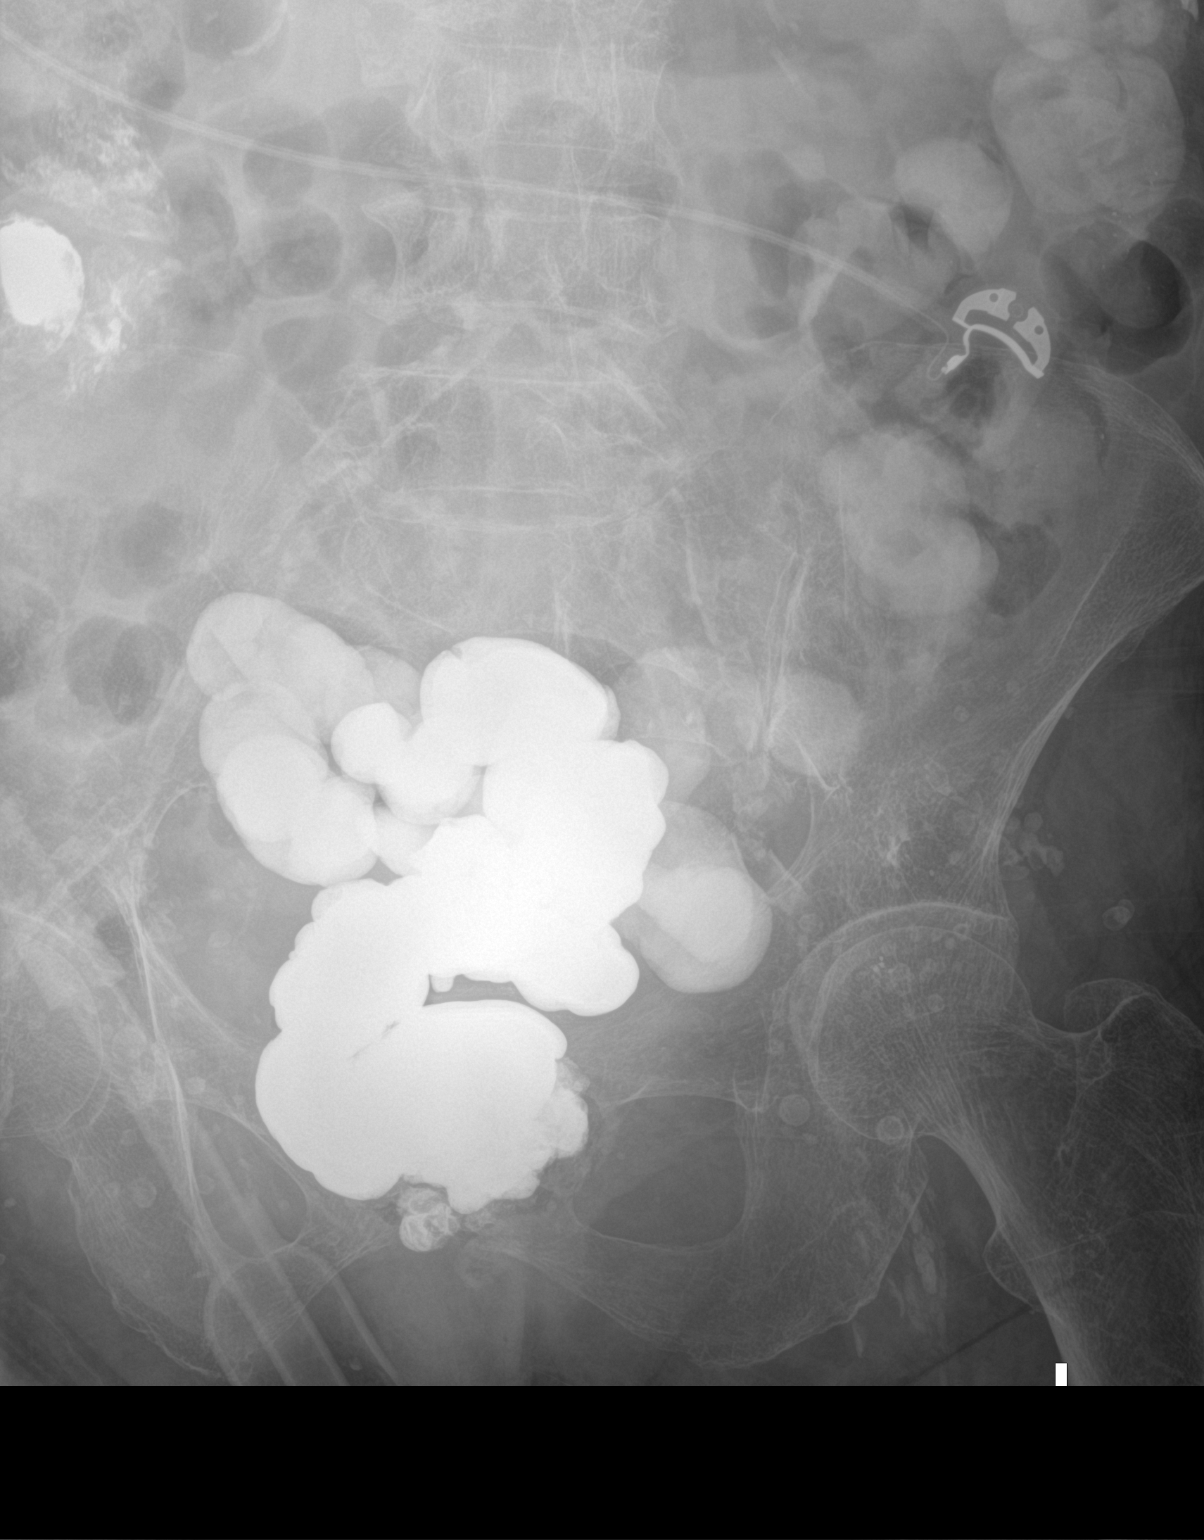

[2 of 2 positions shown; findings below may reference images not displayed]

FINDINGS: Nonspecific bowel gas pattern. Mild-to-moderate stool projects
throughout the colon. There is barium predominantly within the
descending and sigmoid colon from recent esophagram. Osseous
structures are diffusely demineralized with chronic compression
deformity of T12. Advanced aortoiliac vascular calcifications.
IMPRESSION: 1. Nonspecific bowel gas pattern.
2. Mild to moderate colonic stool burden.
